# Patient Record
Sex: Female | Born: 1935 | Race: White | Hispanic: No | Smoking: Former smoker
Health system: Southern US, Community
[De-identification: ages and names within clinical notes are randomized; demographics above are authoritative.]

## PROBLEM LIST (undated history)

## (undated) DIAGNOSIS — S42309A Unspecified fracture of shaft of humerus, unspecified arm, initial encounter for closed fracture: Secondary | ICD-10-CM

## (undated) DIAGNOSIS — F0390 Unspecified dementia without behavioral disturbance: Secondary | ICD-10-CM

## (undated) DIAGNOSIS — I10 Essential (primary) hypertension: Secondary | ICD-10-CM

## (undated) DIAGNOSIS — I80209 Phlebitis and thrombophlebitis of unspecified deep vessels of unspecified lower extremity: Secondary | ICD-10-CM

## (undated) DIAGNOSIS — D696 Thrombocytopenia, unspecified: Secondary | ICD-10-CM

## (undated) DIAGNOSIS — M0520 Rheumatoid vasculitis with rheumatoid arthritis of unspecified site: Secondary | ICD-10-CM

## (undated) DIAGNOSIS — N390 Urinary tract infection, site not specified: Secondary | ICD-10-CM

## (undated) DIAGNOSIS — H3530 Unspecified macular degeneration: Secondary | ICD-10-CM

## (undated) DIAGNOSIS — D72829 Elevated white blood cell count, unspecified: Secondary | ICD-10-CM

## (undated) DIAGNOSIS — I809 Phlebitis and thrombophlebitis of unspecified site: Secondary | ICD-10-CM

## (undated) DIAGNOSIS — E079 Disorder of thyroid, unspecified: Secondary | ICD-10-CM

## (undated) DIAGNOSIS — Z7901 Long term (current) use of anticoagulants: Secondary | ICD-10-CM

## (undated) DIAGNOSIS — E039 Hypothyroidism, unspecified: Secondary | ICD-10-CM

## (undated) DIAGNOSIS — T148XXA Other injury of unspecified body region, initial encounter: Secondary | ICD-10-CM

## (undated) DIAGNOSIS — M1990 Unspecified osteoarthritis, unspecified site: Secondary | ICD-10-CM

## (undated) DIAGNOSIS — S32599A Other specified fracture of unspecified pubis, initial encounter for closed fracture: Secondary | ICD-10-CM

## (undated) DIAGNOSIS — S329XXA Fracture of unspecified parts of lumbosacral spine and pelvis, initial encounter for closed fracture: Secondary | ICD-10-CM

## (undated) HISTORY — DX: Hypothyroidism, unspecified: E03.9

## (undated) HISTORY — DX: Thrombocytopenia, unspecified: D69.6

## (undated) HISTORY — DX: Elevated white blood cell count, unspecified: D72.829

## (undated) HISTORY — DX: Urinary tract infection, site not specified: N39.0

## (undated) HISTORY — DX: Rheumatoid vasculitis with rheumatoid arthritis of unspecified site: M05.20

## (undated) HISTORY — DX: Other injury of unspecified body region, initial encounter: T14.8XXA

## (undated) HISTORY — PX: CHOLECYSTECTOMY: SHX55

## (undated) HISTORY — DX: Unspecified osteoarthritis, unspecified site: M19.90

## (undated) HISTORY — DX: Essential (primary) hypertension: I10

## (undated) HISTORY — PX: ABDOMINAL HYSTERECTOMY: SUR658

## (undated) HISTORY — PX: SINUS SURGERY WITH INSTATRAK: SHX5215

## (undated) HISTORY — DX: Disorder of thyroid, unspecified: E07.9

## (undated) HISTORY — DX: Phlebitis and thrombophlebitis of unspecified site: I80.9

## (undated) HISTORY — DX: Unspecified fracture of shaft of humerus, unspecified arm, initial encounter for closed fracture: S42.309A

## (undated) HISTORY — DX: Fracture of unspecified parts of lumbosacral spine and pelvis, initial encounter for closed fracture: S32.9XXA

## (undated) HISTORY — DX: Unspecified macular degeneration: H35.30

## (undated) HISTORY — DX: Phlebitis and thrombophlebitis of unspecified deep vessels of unspecified lower extremity: I80.209

## (undated) HISTORY — PX: OTHER SURGICAL HISTORY: SHX169

---

## 2000-03-10 ENCOUNTER — Emergency Department (HOSPITAL_COMMUNITY): Admission: EM | Admit: 2000-03-10 | Discharge: 2000-03-10 | Payer: Self-pay | Admitting: Emergency Medicine

## 2000-03-14 ENCOUNTER — Encounter: Payer: Self-pay | Admitting: Gastroenterology

## 2000-03-14 ENCOUNTER — Encounter: Admission: RE | Admit: 2000-03-14 | Discharge: 2000-03-14 | Payer: Self-pay | Admitting: Gastroenterology

## 2000-03-23 ENCOUNTER — Ambulatory Visit (HOSPITAL_COMMUNITY): Admission: RE | Admit: 2000-03-23 | Discharge: 2000-03-23 | Payer: Self-pay | Admitting: Gastroenterology

## 2002-03-28 ENCOUNTER — Ambulatory Visit (HOSPITAL_COMMUNITY): Admission: RE | Admit: 2002-03-28 | Discharge: 2002-03-28 | Payer: Self-pay | Admitting: Specialist

## 2002-12-10 ENCOUNTER — Encounter: Payer: Self-pay | Admitting: Cardiology

## 2002-12-10 ENCOUNTER — Encounter: Admission: RE | Admit: 2002-12-10 | Discharge: 2002-12-10 | Payer: Self-pay | Admitting: Cardiology

## 2003-07-08 ENCOUNTER — Ambulatory Visit (HOSPITAL_COMMUNITY): Admission: RE | Admit: 2003-07-08 | Discharge: 2003-07-08 | Payer: Self-pay | Admitting: Specialist

## 2003-10-13 ENCOUNTER — Encounter: Admission: RE | Admit: 2003-10-13 | Discharge: 2003-10-13 | Payer: Self-pay | Admitting: Orthopedic Surgery

## 2003-12-17 ENCOUNTER — Encounter: Admission: RE | Admit: 2003-12-17 | Discharge: 2003-12-17 | Payer: Self-pay | Admitting: Cardiology

## 2005-01-12 ENCOUNTER — Encounter: Admission: RE | Admit: 2005-01-12 | Discharge: 2005-01-12 | Payer: Self-pay | Admitting: Cardiology

## 2005-10-08 ENCOUNTER — Encounter (HOSPITAL_COMMUNITY): Admission: RE | Admit: 2005-10-08 | Discharge: 2006-01-06 | Payer: Self-pay | Admitting: Otolaryngology

## 2006-01-14 ENCOUNTER — Encounter: Admission: RE | Admit: 2006-01-14 | Discharge: 2006-01-14 | Payer: Self-pay | Admitting: Cardiology

## 2006-01-22 ENCOUNTER — Inpatient Hospital Stay (HOSPITAL_COMMUNITY): Admission: EM | Admit: 2006-01-22 | Discharge: 2006-02-01 | Payer: Self-pay | Admitting: Emergency Medicine

## 2007-02-01 ENCOUNTER — Encounter: Admission: RE | Admit: 2007-02-01 | Discharge: 2007-02-01 | Payer: Self-pay | Admitting: Cardiology

## 2009-08-26 ENCOUNTER — Encounter: Admission: RE | Admit: 2009-08-26 | Discharge: 2009-08-26 | Payer: Self-pay | Admitting: Cardiology

## 2010-05-20 ENCOUNTER — Ambulatory Visit: Payer: Self-pay | Admitting: Cardiology

## 2010-06-06 ENCOUNTER — Emergency Department (HOSPITAL_COMMUNITY): Admission: EM | Admit: 2010-06-06 | Discharge: 2010-06-06 | Payer: Self-pay | Admitting: Emergency Medicine

## 2010-06-07 ENCOUNTER — Inpatient Hospital Stay (HOSPITAL_COMMUNITY): Admission: EM | Admit: 2010-06-07 | Discharge: 2010-06-11 | Payer: Self-pay | Admitting: Emergency Medicine

## 2010-08-11 ENCOUNTER — Ambulatory Visit: Payer: Self-pay | Admitting: Cardiology

## 2010-10-05 ENCOUNTER — Ambulatory Visit: Payer: Self-pay | Admitting: Cardiology

## 2010-10-19 ENCOUNTER — Encounter: Payer: Self-pay | Admitting: Cardiology

## 2010-11-02 ENCOUNTER — Encounter (INDEPENDENT_AMBULATORY_CARE_PROVIDER_SITE_OTHER): Payer: Self-pay

## 2010-11-02 DIAGNOSIS — Z7901 Long term (current) use of anticoagulants: Secondary | ICD-10-CM

## 2010-11-02 DIAGNOSIS — I749 Embolism and thrombosis of unspecified artery: Secondary | ICD-10-CM

## 2010-11-18 ENCOUNTER — Encounter (INDEPENDENT_AMBULATORY_CARE_PROVIDER_SITE_OTHER): Payer: BC Managed Care – PPO

## 2010-11-18 DIAGNOSIS — Z7901 Long term (current) use of anticoagulants: Secondary | ICD-10-CM

## 2010-11-18 DIAGNOSIS — E039 Hypothyroidism, unspecified: Secondary | ICD-10-CM

## 2010-11-18 DIAGNOSIS — I119 Hypertensive heart disease without heart failure: Secondary | ICD-10-CM

## 2010-12-02 ENCOUNTER — Other Ambulatory Visit (INDEPENDENT_AMBULATORY_CARE_PROVIDER_SITE_OTHER): Payer: BC Managed Care – PPO

## 2010-12-02 DIAGNOSIS — Z7901 Long term (current) use of anticoagulants: Secondary | ICD-10-CM

## 2010-12-02 DIAGNOSIS — I749 Embolism and thrombosis of unspecified artery: Secondary | ICD-10-CM

## 2010-12-10 LAB — PROTIME-INR
INR: 2.45 — ABNORMAL HIGH (ref 0.00–1.49)
INR: 2.67 — ABNORMAL HIGH (ref 0.00–1.49)
INR: 2.7 — ABNORMAL HIGH (ref 0.00–1.49)
Prothrombin Time: 26.7 seconds — ABNORMAL HIGH (ref 11.6–15.2)
Prothrombin Time: 28 seconds — ABNORMAL HIGH (ref 11.6–15.2)

## 2010-12-10 LAB — CBC
HCT: 33.5 % — ABNORMAL LOW (ref 36.0–46.0)
HCT: 35.1 % — ABNORMAL LOW (ref 36.0–46.0)
HCT: 38.5 % (ref 36.0–46.0)
HCT: 40.5 % (ref 36.0–46.0)
Hemoglobin: 11.4 g/dL — ABNORMAL LOW (ref 12.0–15.0)
Hemoglobin: 12 g/dL (ref 12.0–15.0)
Hemoglobin: 13.6 g/dL (ref 12.0–15.0)
MCH: 34 pg (ref 26.0–34.0)
MCH: 34.1 pg — ABNORMAL HIGH (ref 26.0–34.0)
MCH: 34.3 pg — ABNORMAL HIGH (ref 26.0–34.0)
MCH: 34.3 pg — ABNORMAL HIGH (ref 26.0–34.0)
MCHC: 34 g/dL (ref 30.0–36.0)
MCHC: 33.6 g/dL (ref 30.0–36.0)
MCHC: 34.2 g/dL (ref 30.0–36.0)
MCHC: 34.2 g/dL (ref 30.0–36.0)
MCHC: 34.3 g/dL (ref 30.0–36.0)
MCV: 100 fL (ref 78.0–100.0)
MCV: 101.8 fL — ABNORMAL HIGH (ref 78.0–100.0)
MCV: 102 fL — ABNORMAL HIGH (ref 78.0–100.0)
MCV: 99.7 fL (ref 78.0–100.0)
Platelets: 214 10*3/uL (ref 150–400)
Platelets: 144 10*3/uL — ABNORMAL LOW (ref 150–400)
RBC: 3.97 MIL/uL (ref 3.87–5.11)
RBC: 4.33 MIL/uL (ref 3.87–5.11)
RDW: 12.4 % (ref 11.5–15.5)
RDW: 12.6 % (ref 11.5–15.5)
RDW: 12.8 % (ref 11.5–15.5)
RDW: 12.9 % (ref 11.5–15.5)
RDW: 13 % (ref 11.5–15.5)
WBC: 13.2 K/uL — ABNORMAL HIGH (ref 4.0–10.5)

## 2010-12-10 LAB — URINALYSIS, ROUTINE W REFLEX MICROSCOPIC
Bilirubin Urine: NEGATIVE
Bilirubin Urine: NEGATIVE
Glucose, UA: NEGATIVE mg/dL
Hgb urine dipstick: NEGATIVE
Ketones, ur: NEGATIVE mg/dL
Ketones, ur: NEGATIVE mg/dL
Nitrite: NEGATIVE
Nitrite: NEGATIVE
Protein, ur: NEGATIVE mg/dL
Protein, ur: NEGATIVE mg/dL
Specific Gravity, Urine: 1.024 (ref 1.005–1.030)
Urobilinogen, UA: 0.2 mg/dL (ref 0.0–1.0)
Urobilinogen, UA: 2 mg/dL — ABNORMAL HIGH (ref 0.0–1.0)
pH: 5.5 (ref 5.0–8.0)

## 2010-12-10 LAB — DIFFERENTIAL
Basophils Absolute: 0 10*3/uL (ref 0.0–0.1)
Basophils Absolute: 0 K/uL (ref 0.0–0.1)
Basophils Relative: 0 % (ref 0–1)
Eosinophils Absolute: 0 10*3/uL (ref 0.0–0.7)
Eosinophils Relative: 0 % (ref 0–5)
Lymphocytes Relative: 12 % (ref 12–46)
Lymphocytes Relative: 22 % (ref 12–46)
Lymphs Abs: 1.6 K/uL (ref 0.7–4.0)
Monocytes Absolute: 1 10*3/uL (ref 0.1–1.0)
Monocytes Absolute: 1.5 K/uL — ABNORMAL HIGH (ref 0.1–1.0)
Monocytes Relative: 11 % (ref 3–12)
Neutro Abs: 10 K/uL — ABNORMAL HIGH (ref 1.7–7.7)
Neutrophils Relative %: 69 % (ref 43–77)
Neutrophils Relative %: 76 % (ref 43–77)

## 2010-12-10 LAB — URINE CULTURE
Colony Count: >=100000
Colony Count: NO GROWTH
Culture  Setup Time: 201109111811
Culture  Setup Time: 201109150018
Culture: NO GROWTH
Special Requests: NEGATIVE

## 2010-12-10 LAB — TSH: TSH: 1.357 u[IU]/mL (ref 0.350–4.500)

## 2010-12-10 LAB — COMPREHENSIVE METABOLIC PANEL
ALT: 46 U/L — ABNORMAL HIGH (ref 0–35)
AST: 139 U/L — ABNORMAL HIGH (ref 0–37)
Alkaline Phosphatase: 58 U/L (ref 39–117)
BUN: 19 mg/dL (ref 6–23)
Calcium: 9.4 mg/dL (ref 8.4–10.5)
GFR calc Af Amer: >60 mL/min (ref >60)
Total Protein: 6.1 g/dL (ref 6.0–8.3)

## 2010-12-10 LAB — BASIC METABOLIC PANEL
BUN: 13 mg/dL (ref 6–23)
BUN: 19 mg/dL (ref 6–23)
CO2: 30 mEq/L (ref 19–32)
CO2: 30 mEq/L (ref 19–32)
Calcium: 9.3 mg/dL (ref 8.4–10.5)
Calcium: 9.5 mg/dL (ref 8.4–10.5)
Creatinine, Ser: 0.96 mg/dL (ref 0.4–1.2)
GFR calc non Af Amer: >60 mL/min (ref >60)
Glucose, Bld: 116 mg/dL — ABNORMAL HIGH (ref 70–99)
Glucose, Bld: 146 mg/dL — ABNORMAL HIGH (ref 70–99)
Potassium: 3.6 mEq/L (ref 3.5–5.1)

## 2010-12-10 LAB — BASIC METABOLIC PANEL WITH GFR
Chloride: 98 meq/L (ref 96–112)
GFR calc Af Amer: >60 mL/min (ref >60)
GFR calc non Af Amer: 56 mL/min — ABNORMAL LOW (ref >60)
Potassium: 3.8 meq/L (ref 3.5–5.1)
Sodium: 135 meq/L (ref 135–145)

## 2010-12-10 LAB — POCT I-STAT, CHEM 8
Calcium, Ion: 1.19 mmol/L (ref 1.12–1.32)
Chloride: 105 mEq/L (ref 96–112)
Creatinine, Ser: 0.9 mg/dL (ref 0.4–1.2)
HCT: 46 % (ref 36.0–46.0)
Potassium: 3.5 mEq/L (ref 3.5–5.1)
Sodium: 140 mEq/L (ref 135–145)

## 2011-01-05 ENCOUNTER — Encounter: Payer: Self-pay | Admitting: *Deleted

## 2011-01-05 ENCOUNTER — Ambulatory Visit (INDEPENDENT_AMBULATORY_CARE_PROVIDER_SITE_OTHER): Payer: BC Managed Care – PPO | Admitting: Cardiology

## 2011-01-05 ENCOUNTER — Ambulatory Visit (INDEPENDENT_AMBULATORY_CARE_PROVIDER_SITE_OTHER): Payer: Medicare Other | Admitting: Cardiology

## 2011-01-05 ENCOUNTER — Encounter: Payer: Self-pay | Admitting: Cardiology

## 2011-01-05 DIAGNOSIS — I82409 Acute embolism and thrombosis of unspecified deep veins of unspecified lower extremity: Secondary | ICD-10-CM | POA: Insufficient documentation

## 2011-01-05 DIAGNOSIS — H3530 Unspecified macular degeneration: Secondary | ICD-10-CM

## 2011-01-05 DIAGNOSIS — M1990 Unspecified osteoarthritis, unspecified site: Secondary | ICD-10-CM

## 2011-01-05 DIAGNOSIS — I119 Hypertensive heart disease without heart failure: Secondary | ICD-10-CM | POA: Insufficient documentation

## 2011-01-05 DIAGNOSIS — M810 Age-related osteoporosis without current pathological fracture: Secondary | ICD-10-CM | POA: Insufficient documentation

## 2011-01-05 DIAGNOSIS — E039 Hypothyroidism, unspecified: Secondary | ICD-10-CM | POA: Insufficient documentation

## 2011-01-05 LAB — POCT INR: INR: 3.2

## 2011-01-05 NOTE — Assessment & Plan Note (Signed)
The patient has a past history of recurrent deep vein thrombosis.  She has been on long-term Coumadin.  She's not having any symptoms of acute thrombophlebitis.  She's having no symptoms of pulmonary embolism.  Her prothrombin times have generally been within range.  Today's result is pending.

## 2011-01-05 NOTE — Assessment & Plan Note (Signed)
Clinically the patient is euthyroid.  We last checked her thyroid function in February 2012 at which time her TSH was still slightly high and we increased her Synthroid.  She's not having any symptoms of hypothyroidism.

## 2011-01-05 NOTE — Assessment & Plan Note (Signed)
The patient has severe macular degeneration and is functionally blind.  She can no longer drive or read.

## 2011-01-05 NOTE — Progress Notes (Signed)
HPI: This pleasant 77 year old woman is seen back for a scheduled followup office visit.  She has a past history of deep vein thrombosis.  She is on long-term Coumadin.  She took a bad fall in September 2011 and suffered a fractured pelvis.  She had to be in a nursing home for 2 months but is now back home and walks with a walker her nephew lives in a house with her and helps her.  The patient herself is blind.  Patient has a history of essential hypertension and a history of hypothyroidism.  Current Outpatient Prescriptions  Medication Sig Dispense Refill  . acetaminophen (TYLENOL) 325 MG tablet Take 650 mg by mouth every 6 (six) hours as needed.        . bisoprolol-hydrochlorothiazide (ZIAC) 5-6.25 MG per tablet Take 1 tablet by mouth daily.        . diazepam (VALIUM) 5 MG tablet Take 5 mg by mouth every 6 (six) hours as needed.        . diltiazem (CARDIZEM CD) 240 MG 24 hr capsule Take 240 mg by mouth daily.        . hydrochlorothiazide 25 MG tablet Take 25 mg by mouth daily.        Marland Kitchen levothyroxine (SYNTHROID, LEVOTHROID) 50 MCG tablet Take 50 mcg by mouth daily. Take 3 tabs Mon and Wed. ;   1 tablet 4 all others      . predniSONE (DELTASONE) 5 MG tablet Take 5 mg by mouth daily.        Marland Kitchen warfarin (COUMADIN) 5 MG tablet Take 5 mg by mouth daily.          Allergies  Allergen Reactions  . Keflex   . Penicillins   . Sinutab (Sine-Off)   . Skelaxin   . Tetanus Toxoids   . Zestril (Lisinopril)     Patient Active Problem List  Diagnoses  . Hypothyroidism  . Osteoporosis  . Macular degeneration  . Deep vein thrombosis (DVT)  . Benign hypertensive heart disease without heart failure  . Osteoarthritis    History  Smoking status  . Never Smoker   Smokeless tobacco  . Not on file    History  Alcohol Use No    Family History  Problem Relation Age of Onset  . Abnormal EKG Neg Hx   . Abnormal newborn screen Neg Hx   . Achalasia Neg Hx   . Achondroplasia Neg Hx   . Acne Neg Hx    . Acromegaly Neg Hx   . Actinic keratosis Neg Hx   . Acute lymphoblastic leukemia Neg Hx   . Addison's disease Neg Hx   . Adrenal disorder Neg Hx   . Albinism Neg Hx   . Alcohol abuse Neg Hx   . Alkaptonuria Neg Hx   . Allergic rhinitis Neg Hx   . Allergies Neg Hx   . Allergy (severe) Neg Hx   . Alopecia Neg Hx   . Alpha-1 antitrypsin deficiency Neg Hx   . Alport syndrome Neg Hx   . ALS Neg Hx   . Alzheimer's disease Neg Hx   . Ambiguous genitalia Neg Hx   . Amblyopia Neg Hx   . Amenorrhea Neg Hx   . Anal fissures Neg Hx   . Anemia Neg Hx   . Anesthesia problems Neg Hx   . Aneurysm Neg Hx   . Angelman syndrome Neg Hx   . Angina Neg Hx   . Angioedema Neg Hx   .  Ankylosing spondylitis Neg Hx   . Anorectal malformation Neg Hx   . Anorexia nervosa Neg Hx   . Anti-cardiolipin syndrome Neg Hx   . Antithrombin III deficiency Neg Hx   . Anuerysm Neg Hx   . Anxiety disorder Neg Hx   . Aortic aneurysm Neg Hx   . Aortic dissection Neg Hx   . Aortic stenosis Neg Hx   . Aphthous stomatitis Neg Hx   . Aplastic anemia Neg Hx   . Appendicitis Neg Hx   . Apraxia Neg Hx   . Arnold-Chiari malformation Neg Hx   . Arrhythmia Neg Hx   . Arthritis Neg Hx   . Asperger's syndrome Neg Hx   . Asthma Neg Hx   . Ataxia Neg Hx   . Ataxia telangiectasia Neg Hx   . Atopy Neg Hx   . Atrial fibrillation Neg Hx   . Atrophic kidney Neg Hx   . Auditory processing disorder Neg Hx   . Autism Neg Hx   . Autism spectrum disorder Neg Hx   . Autoimmune disease Neg Hx   . AVM Neg Hx   . Baker's cyst Neg Hx   . Barrett's esophagus Neg Hx   . Bartter's syndrome Neg Hx   . Basal cell carcinoma Neg Hx   . Behavior problems Neg Hx   . Bell's palsy Neg Hx   . Benign prostatic hyperplasia Neg Hx   . Bipolar disorder Neg Hx   . Birth defects Neg Hx   . Birth marks Neg Hx   . Blindness Neg Hx   . Bone cancer Neg Hx   . BOR syndrome Neg Hx   . Bow legs Neg Hx   . Bradycardia Neg Hx   . Brain  cancer Neg Hx   . BRCA 1/2 Neg Hx   . Breast cancer Neg Hx   . Broken bones Neg Hx   . Bronchiolitis Neg Hx   . Bronchopulmonary dysplasia Neg Hx   . Bruton's disease Neg Hx   . Bulemia Neg Hx   . Bullous pemphigoid Neg Hx   . Bunion Neg Hx   . Bursitis Neg Hx   . Caf-au-lait spots Neg Hx   . Calcium disorder Neg Hx   . Canavan disease Neg Hx   . Cancer Neg Hx   . Cardiomyopathy Neg Hx   . Carpal tunnel syndrome Neg Hx   . Cataracts Neg Hx   . Celiac disease Neg Hx   . Cerebral aneurysm Neg Hx   . Cerebral palsy Neg Hx   . Cervical cancer Neg Hx   . Cervical polyp Neg Hx   . Cervicitis Neg Hx   . Chalasia Neg Hx   . Chalazion Neg Hx   . Charcot-Marie-Tooth disease Neg Hx   . Chediak-Higashi syndrome Neg Hx   . Chiari malformation Neg Hx   . Childhood respiratory disease Neg Hx   . Choanal atresia Neg Hx   . Cholecystitis Neg Hx   . Cholelithiasis Neg Hx   . Cholesteatoma Neg Hx   . Chondromalacia Neg Hx   . Chorea Neg Hx   . Chromosomal disorder Neg Hx   . Chronic bronchitis Neg Hx   . Chronic fatigue Neg Hx   . Chronic granulomatous disease Neg Hx   . Chronic infections Neg Hx   . Cirrhosis Neg Hx   . Cleft lip Neg Hx   . Cleft palate Neg Hx   . Clotting disorder Neg  Hx   . Club foot Neg Hx   . Coarctation of the aorta Neg Hx   . Colitis Neg Hx   . Collagen disease Neg Hx   . Colon cancer Neg Hx   . Colon polyps Neg Hx   . Colonic polyp Neg Hx   . Color blindness Neg Hx   . Conduct disorder Neg Hx   . Conductive hearing loss Neg Hx   . Congenital adrenal hyperplasia Neg Hx   . Congenital heart disease Neg Hx   . Conjunctivitis Neg Hx   . Consanguinity Neg Hx   . Constipation Neg Hx   . COPD Neg Hx   . Corneal abrasion Neg Hx   . Corneal ulcer Neg Hx   . Coronary aneurysm Neg Hx   . Coronary artery disease Neg Hx   . Cowden syndrome Neg Hx   . Craniosynostosis Neg Hx   . Cri-du-chat syndrome Neg Hx   . Crohn's disease Neg Hx   . Cushing syndrome  Neg Hx   . Cystic fibrosis Neg Hx   . Cystic kidney disease Neg Hx   . Cystinosis Neg Hx   . Decreased libido Neg Hx   . Deep vein thrombosis Neg Hx   . Delayed menopause Neg Hx   . Delayed puberty Neg Hx   . Dementia Neg Hx   . Dental caries Neg Hx   . Depression Neg Hx   . Dermatomyositis Neg Hx   . DES usage Neg Hx   . Developmental delay Neg Hx   . Diabetes Neg Hx   . Diabetes insipidus Neg Hx   . Diabetes type I Neg Hx   . Diabetes type II Neg Hx   . Diabetic kidney disease Neg Hx   . DiGeorge syndrome Neg Hx   . Dilated cardiomyopathy Neg Hx   . Dislocations Neg Hx   . Diverticulitis Neg Hx   . Diverticulosis Neg Hx   . Down syndrome Neg Hx   . Drug abuse Neg Hx   . Dupuytren's contracture Neg Hx   . Dwarfism Neg Hx   . Dysfunctional uterine bleeding Neg Hx   . Dysmenorrhea Neg Hx   . Dyspareunia Neg Hx   . Dysphagia Neg Hx   . Dysrhythmia Neg Hx   . Dystonia Neg Hx   . Early death Neg Hx   . Early menopause Neg Hx   . Early puberty Neg Hx   . Eating disorder Neg Hx   . Eclampsia Neg Hx   . Ectodermal dysplasia Neg Hx   . Eczema Neg Hx   . Edema Neg Hx   . Edward's syndrome Neg Hx   . Ehlers-Danlos syndrome Neg Hx   . Emotional abuse Neg Hx   . Emphysema Neg Hx   . Encopresis Neg Hx   . Endocrine tumor Neg Hx   . Endocrinopathy Neg Hx   . Endometrial cancer Neg Hx   . Endometriosis Neg Hx   . Enuresis Neg Hx   . Eosinophilic granuloma Neg Hx   . Epididymitis Neg Hx   . Erectile dysfunction Neg Hx   . Erythema nodosum Neg Hx   . Esophageal cancer Neg Hx   . Esophageal varices Neg Hx   . Esophagitis Neg Hx   . Exostosis Neg Hx   . Fabry's disease Neg Hx   . Factor IX deficiency Neg Hx   . Factor V Leiden deficiency Neg Hx   . Factor VIII  deficiency Neg Hx   . Failure to thrive Neg Hx   . Fainting Neg Hx   . Familial dysautonomia Neg Hx   . Familial nephritis Neg Hx   . Familial polyposis Neg Hx   . Fanconi anemia Neg Hx   . Febrile seizures  Neg Hx   . Fibrocystic breast disease Neg Hx   . Fibroids Neg Hx   . Fibromyalgia Neg Hx   . Food intolerance Neg Hx   . Fragile X syndrome Neg Hx   . Friedreich's ataxia Neg Hx   . Frontotemporal dementia Neg Hx   . Fuch's dystrophy Neg Hx   . Gait disorder Neg Hx   . Galactosemia Neg Hx   . Gallbladder disease Neg Hx   . Gaucher's disease Neg Hx   . Genodermatoses Neg Hx   . GER disease Neg Hx   . Gestational diabetes Neg Hx   . GI problems Neg Hx   . Glaucoma Neg Hx   . Glomerulonephritis Neg Hx   . Glucose-6-phos deficiency Neg Hx   . Glycogen storage disease Neg Hx   . Goiter Neg Hx   . Gonadal disorder Neg Hx   . Gout Neg Hx   . Graves' disease Neg Hx   . Growth hormone deficiency Neg Hx   . GU problems Neg Hx   . Hammer toes Neg Hx   . Hartnup's disease Neg Hx   . Hashimoto's thyroiditis Neg Hx   . Healthy Neg Hx   . Hearing loss Neg Hx   . Heart attack Neg Hx   . Heart block Neg Hx   . Heart defect Neg Hx   . Heart disease Neg Hx   . Heart failure Neg Hx   . Heart murmur Neg Hx   . Hemangiomas Neg Hx   . Hematuria Neg Hx   . Hemochromatosis Neg Hx   . Hemolytic uremic syndrome Neg Hx   . Hemophilia Neg Hx   . Henoch-Schonlein purpura Neg Hx   . Hepatitis Neg Hx   . Hepatomegaly Neg Hx   . Hereditary spherocytosis Neg Hx   . Hernia Neg Hx   . Hiatal hernia Neg Hx   . High arches Neg Hx   . Hip dysplasia Neg Hx   . Hip fracture Neg Hx   . Hirschsprung's disease Neg Hx   . Hirsutism Neg Hx   . Histiocytosis X Neg Hx   . HIV Neg Hx   . HLA-B27 positive Neg Hx   . Hodgkin's lymphoma Neg Hx   . Homocystinuria Neg Hx   . Hunter's disease Neg Hx   . Huntington's disease Neg Hx   . Hydrocele Neg Hx   . Hydrocephalus Neg Hx   . Hygroma Neg Hx   . Hypercalcemia Neg Hx   . Hypereosinophilic syndrome Neg Hx   . Hyperinsulinemia Neg Hx   . Hyperkalemia Neg Hx   . Hyperlipidemia Neg Hx   . Hypermobility Neg Hx   . Hypernasality Neg Hx   . Hyperopia Neg  Hx   . Hyperparathyroidism Neg Hx   . Hypersomnolence Neg Hx   . Hypertension Neg Hx   . Hyperthyroidism Neg Hx   . Hypertrophic cardiomyopathy Neg Hx   . Hypokalemia Neg Hx   . Hypoparathyroidism Neg Hx   . Hypoplastic kidney Neg Hx   . Hypotension Neg Hx   . Hypothyroidism Neg Hx   . Idiopathic pulmonary fibrosis Neg Hx   .  Idiopathic torsion dystonia Neg Hx   . IgA nephropathy Neg Hx   . Immunodeficiency Neg Hx   . Imperforate anus Neg Hx   . Impotence Neg Hx   . Impulse control disorder Neg Hx   . Incompetent cervix Neg Hx   . Infertility Neg Hx   . Inflammatory bowel disease Neg Hx   . Inguinal hernia Neg Hx   . Insomnia Neg Hx   . Insulin resistance Neg Hx   . Interstitial cystitis Neg Hx   . Intestinal malrotation Neg Hx   . Intestinal polyp Neg Hx   . Intracerebral hemorrhage Neg Hx   . Iron deficiency Neg Hx   . Irregular heart beat Neg Hx   . Irritable bowel syndrome Neg Hx   . Jaundice Neg Hx   . Job's syndrome Neg Hx   . Joint hypermobility Neg Hx   . Juvenile idiopathic arthritis Neg Hx   . Kartagener's syndrome Neg Hx   . Kawasaki disease Neg Hx   . Keloids Neg Hx   . Kennedy's disease Neg Hx   . Keratoconus Neg Hx   . Kidney cancer Neg Hx   . Kidney disease Neg Hx   . Kidney failure Neg Hx   . Kidney nephrosis Neg Hx   . Klinefelter's syndrome Neg Hx   . Krabbe disease Neg Hx   . Kyphosis Neg Hx   . Labyrinthitis Neg Hx   . Lactose intolerance Neg Hx   . Language disorder Neg Hx   . Lead poisoning Neg Hx   . Learning disabilities Neg Hx   . Legg-Calve-Perthes disease Neg Hx   . Lesch-Nyhan syndrome Neg Hx   . Leukemia Neg Hx   . Lichen planus Neg Hx   . Li-Fraumeni syndrome Neg Hx   . Liver cancer Neg Hx   . Liver disease Neg Hx   . Long QT syndrome Neg Hx   . Lumbar disc disease Neg Hx   . Lung cancer Neg Hx   . Lung disease Neg Hx   . Lupus Neg Hx   . Lymphoma Neg Hx   . Macrocephaly Neg Hx   . Macrosomia Neg Hx   . Macular  degeneration Neg Hx   . Malignant hypertension Neg Hx   . Malignant hyperthermia Neg Hx   . Maple syrup urine disease Neg Hx   . Marfan syndrome Neg Hx   . Mastocytosis Neg Hx   . Melanoma Neg Hx   . Memory loss Neg Hx   . Meniere's disease Neg Hx   . Menstrual problems Neg Hx   . Mental illness Neg Hx   . Mental retardation Neg Hx   . Metabolic syndrome Neg Hx   . Microcephaly Neg Hx   . Migraines Neg Hx   . Milk intolerance Neg Hx   . Miscarriages / Stillbirths Neg Hx   . Mitochondrial disorder Neg Hx   . Mitral valve prolapse Neg Hx   . Motor neuron disease Neg Hx   . Movement disorder Neg Hx   . Moyamoya disease Neg Hx   . Multiple births Neg Hx   . Multiple endocrine neoplasia Neg Hx   . Multiple fractures Neg Hx   . Multiple myeloma Neg Hx   . Multiple sclerosis Neg Hx   . Muscle cancer Neg Hx   . Muscular dystrophy Neg Hx   . Myasthenia gravis Neg Hx   . Myelodysplastic syndrome Neg Hx   . Myocarditis  Neg Hx   . Myoclonus Neg Hx   . Myopathy Neg Hx   . Nail disease Neg Hx   . Narcolepsy Neg Hx   . Nephritis Neg Hx   . Nephrolithiasis Neg Hx   . Nephrotic syndrome Neg Hx   . Neural tube defect Neg Hx   . Neurodegenerative disease Neg Hx   . Neurofibromatosis Neg Hx   . Neuromuscular disorder Neg Hx   . Neuropathy Neg Hx   . Neutropenia Neg Hx   . Nevi Neg Hx   . Niemann-Pick disease Neg Hx   . Night blindness Neg Hx   . Nocturnal enuresis Neg Hx   . Nystagmus Neg Hx   . Obesity Neg Hx   . OCD Neg Hx   . ODD Neg Hx   . Orchitis Neg Hx   . Osler-Weber-Rendu syndrome Neg Hx   . Osteoarthritis Neg Hx   . Osteochondroma Neg Hx   . Osteogenesis imperfecta Neg Hx   . Osteopenia Neg Hx   . Osteoporosis Neg Hx   . Osteosarcoma Neg Hx   . Osteosclerosis Neg Hx   . Other Neg Hx   . Otitis media Neg Hx   . Ovarian cancer Neg Hx   . Ovarian cysts Neg Hx   . Oxalosis Neg Hx   . Paget's disease of bone Neg Hx   . Pancreatic cancer Neg Hx   . Pancreatitis  Neg Hx   . Panhypopituitarism Neg Hx   . Panic disorder Neg Hx   . Paranoid behavior Neg Hx   . Parasomnia Neg Hx   . Parkinsonism Neg Hx   . Patau's syndrome Neg Hx   . Pathological gambling Neg Hx   . PDD Neg Hx   . Pectus carinatum Neg Hx   . Pectus excavatum Neg Hx   . Pelvic inflammatory disease Neg Hx   . Pemphigus vulgaris Neg Hx   . Penile cancer Neg Hx   . Periodic limb movement Neg Hx   . Peripheral vascular disease Neg Hx   . Pernicious anemia Neg Hx   . Personality disorder Neg Hx   . Pes cavus Neg Hx   . Physical abuse Neg Hx   . Otilio Jefferson sequence Neg Hx   . PKU Neg Hx   . Plantar fasciitis Neg Hx   . Pleurisy Neg Hx   . Pneumonia Neg Hx   . Polychondritis Neg Hx   . Polycystic kidney disease Neg Hx   . Polycystic ovary syndrome Neg Hx   . Polycythemia Neg Hx   . Polymyalgia rheumatica Neg Hx   . Polymyositis Neg Hx   . Pompe disease Neg Hx   . Positive PPD/TB Exposure Neg Hx   . Posterior urethral valves Neg Hx   . Post-traumatic stress disorder Neg Hx   . Prader-Willi syndrome Neg Hx   . Premature birth Neg Hx   . Premature ovarian failure Neg Hx   . Preterm labor Neg Hx   . Prolactinoma Neg Hx   . Prostate cancer Neg Hx   . Prostatitis Neg Hx   . Protein C deficiency Neg Hx   . Protein S deficiency Neg Hx   . Proteinuria Neg Hx   . Prune belly syndrome Neg Hx   . Pruritis Neg Hx   . Pseudochol deficiency Neg Hx   . Pseudotumor cerebri Neg Hx   . Psoriasis Neg Hx   . Psychosis Neg Hx   . Ptosis  Neg Hx   . Pulmonary embolism Neg Hx   . Pulmonary fibrosis Neg Hx   . Pyelonephritis Neg Hx   . Pyloric stenosis Neg Hx   . Pyruvate dehydrogenase deficiency Neg Hx   . Rashes / Skin problems Neg Hx   . Raynaud syndrome Neg Hx   . Rectal cancer Neg Hx   . Recurrent abdominal pain Neg Hx   . Reiter's syndrome Neg Hx   . Renal tubular acidosis Neg Hx   . Restless legs syndrome Neg Hx   . Retinal degeneration Neg Hx   . Retinal detachment Neg Hx    . Retinitis pigmentosa Neg Hx   . Retinoblastoma Neg Hx   . Retinopathy of prematurity Neg Hx   . Reye's syndrome Neg Hx   . Rheum arthritis Neg Hx   . Rheumatic fever Neg Hx   . Rheumatologic disease Neg Hx   . Rickets Neg Hx   . Rosacea Neg Hx   . Sacroiliitis Neg Hx   . Sarcoidosis Neg Hx   . Schizophrenia Neg Hx   . Scleritis Neg Hx   . Scleroderma Neg Hx   . Scoliosis Neg Hx   . Seasonal affective disorder Neg Hx   . Seizures Neg Hx   . Selective mutism Neg Hx   . Sensorineural hearing loss Neg Hx   . Severe combined immunodeficiency Neg Hx   . Severe sprains Neg Hx   . Sexual abuse Neg Hx   . Short stature Neg Hx   . Sick sinus syndrome Neg Hx   . Sickle cell anemia Neg Hx   . Sickle cell trait Neg Hx   . SIDS Neg Hx   . Single kidney Neg Hx   . Sinusitis Neg Hx   . Sjogren's syndrome Neg Hx   . Skeletal dysplasia Neg Hx   . Skin cancer Neg Hx   . Skin telangiectasia Neg Hx   . Sleep apnea Neg Hx   . Sleep disorder Neg Hx   . Sleep walking Neg Hx   . Snoring Neg Hx   . Social phobia Neg Hx   . Speech disorder Neg Hx   . Spina bifida Neg Hx   . Spinal muscular atrophy Neg Hx   . Splenomegaly Neg Hx   . Spondyloarthropathy Neg Hx   . Spondylolisthesis Neg Hx   . Spondylolysis Neg Hx   . Squamous cell carcinoma Neg Hx   . Stevens-Johnson syndrome Neg Hx   . Stickler syndrome Neg Hx   . Stomach cancer Neg Hx   . Strabismus Neg Hx   . Stroke Neg Hx   . Stuttering Neg Hx   . Subarachnoid hemorrhage Neg Hx   . Sudden death Neg Hx   . Suicidality Neg Hx   . Supraventricular tachycardia Neg Hx   . Swallowing difficulties Neg Hx   . Tall stature Neg Hx   . Tay-Sachs disease Neg Hx   . Testicular cancer Neg Hx   . Thalassemia Neg Hx   . Throat cancer Neg Hx   . Thrombocytopenia Neg Hx   . Thrombophilia Neg Hx   . Thrombophlebitis Neg Hx   . Thrombosis Neg Hx   . Thymic aplasia Neg Hx   . Thyroid cancer Neg Hx   . Thyroid disease Neg Hx   . Thyroid  nodules Neg Hx   . Tics Neg Hx   . Tongue cancer Neg Hx   . Torticollis Neg Hx   .  Tourette syndrome Neg Hx   . Tracheal cancer Neg Hx   . Tracheoesophageal fistual Neg Hx   . Tracheomalacia Neg Hx   . Transient ischemic attack Neg Hx   . Treacher Collins syndrome Neg Hx   . Tremor Neg Hx   . Tuberculosis Neg Hx   . Tuberous sclerosis Neg Hx   . Turner syndrome Neg Hx   . Ulcerative colitis Neg Hx   . Ulcers Neg Hx   . Undescended testes Neg Hx   . Unexplained death Neg Hx   . Urinary tract infection Neg Hx   . Urolithiasis Neg Hx   . Urticaria Neg Hx   . Uterine cancer Neg Hx   . Uveitis Neg Hx   . Vaginal cancer Neg Hx   . Valvular heart disease Neg Hx   . Vasculitis Neg Hx   . Velocardiofacial syndrome Neg Hx   . Venous thrombosis Neg Hx   . Verruca vulgaris  Neg Hx   . Vesicoureteral reflux Neg Hx   . Vision loss Neg Hx   . Vitamin D deficiency Neg Hx   . Vitiligo Neg Hx   . Voice disorder Neg Hx   . Von Gierke's disease Neg Hx   . Von Hippel-Lindau syndrome Neg Hx   . Von Willebrand disease Neg Hx   . Werdnig Hoffman Disease Neg Hx   . Williams syndrome Neg Hx   . Wilm's tumor Neg Hx   . Wilson's disease Neg Hx   . Wiskott-Aldrich syndrome Neg Hx   . Evelene Croon Parkinson White syndrome Neg Hx   . Xeroderma pigmentosa Neg Hx     Review of Systems: The patient denies any heat or cold intolerance.  No weight gain or weight loss.  The patient denies headaches or blurry vision.  There is no cough or sputum production.  The patient denies dizziness.  There is no hematuria or hematochezia.  The patient denies any muscle aches or arthritis.  The patient denies any rash.  The patient denies frequent falling or instability.  There is no history of depression or anxiety.  All other systems were reviewed and are negative.   Physical Exam: Filed Vitals:   01/05/11 1128  BP: 136/70  Pulse: 70   Her weight is 149.  The general appearance reveals an elderly woman in no acute  distress.  She walks with a walker.Pupils equal and reactive.   Extraocular Movements are full.  There is no scleral icterus.  The mouth and pharynx are normal.  The neck is supple.  The carotids reveal no bruits.  The jugular venous pressure is normal.  The thyroid is not enlarged.  There is no lymphadenopathy.The chest is clear to percussion and auscultation. There are no rales or rhonchi. Expansion of the chest is symmetrical.The precordium is quiet.  The first heart sound is normal.  The second heart sound is physiologically split.  There is no murmur gallop rub or click.  There is no abnormal lift or heave.  The rhythm is regular.The abdomen is soft and nontender. Bowel sounds are normal. The liver and spleen are not enlarged. There Are no abdominal masses. There are no bruits.The pedal pulses are good.  There is no phlebitis or edema.  There is no cyanosis or clubbing.Strength is normal and symmetrical in all extremities.  There is no lateralizing weakness.  There are no sensory deficits.   Assessment / Plan: Return inMonthly intervals and when we see her for an office  visit in 3 months we'll get a protime and also TSH and free T4.

## 2011-01-19 ENCOUNTER — Ambulatory Visit (INDEPENDENT_AMBULATORY_CARE_PROVIDER_SITE_OTHER): Payer: Medicare Other | Admitting: *Deleted

## 2011-01-19 DIAGNOSIS — I82409 Acute embolism and thrombosis of unspecified deep veins of unspecified lower extremity: Secondary | ICD-10-CM

## 2011-01-19 LAB — POCT INR: INR: 2.9

## 2011-01-29 ENCOUNTER — Other Ambulatory Visit: Payer: Self-pay | Admitting: Cardiology

## 2011-01-29 DIAGNOSIS — Z1231 Encounter for screening mammogram for malignant neoplasm of breast: Secondary | ICD-10-CM

## 2011-02-12 ENCOUNTER — Ambulatory Visit (INDEPENDENT_AMBULATORY_CARE_PROVIDER_SITE_OTHER): Payer: Medicare Other | Admitting: *Deleted

## 2011-02-12 ENCOUNTER — Ambulatory Visit
Admission: RE | Admit: 2011-02-12 | Discharge: 2011-02-12 | Disposition: A | Discharge status: Home / Self Care | Payer: Medicare Other | Source: Ambulatory Visit | Attending: Cardiology | Admitting: Cardiology

## 2011-02-12 DIAGNOSIS — Z1231 Encounter for screening mammogram for malignant neoplasm of breast: Secondary | ICD-10-CM

## 2011-02-12 DIAGNOSIS — I82409 Acute embolism and thrombosis of unspecified deep veins of unspecified lower extremity: Secondary | ICD-10-CM

## 2011-02-12 LAB — POCT INR: INR: 2.2

## 2011-02-12 NOTE — Procedures (Signed)
[facility]  Patient:    Marissa Parsons, Marissa Parsons                     MRN: [id number] Proc. Date: 03/23/00 Adm. Date:  [id number] Attending:  Louie Bun CC:         Clovis Pu [name], [name]                           Procedure Report  PROCEDURE:  Esophagogastroduodenoscopy.  INDICATIONS FOR PROCEDURE:  Single episode of choking with possible food impaction. Barium swallow suggested some sort of cervical abnormality with a temporary hang up of the barium pill there.  The procedure is to better clarify this.  DESCRIPTION OF PROCEDURE:  The patient was placed in the left lateral decubitus  position and placed on the pulse monitor with continuous low flow oxygen delivered by nasal cannula.  She was sedated with 87.5 mcg of IV fentanyl and 5 mg of IV Versed.  The Olympus video endoscope was advanced under direct vision through the oropharynx and esophagus.  The esophagus was straight and of normal caliber at he squamocolumnar line at 39 cm. It was irregular and broken up with one or two islands of columnar epithelium above it, but with no definite erosions, exudate or ulcers.  There appeared to be a small sliding hiatal hernia distal to the Z-line. The stomach was entered and a small amount of liquid secretions were suctioned rom the fundus.  A retroflexed view of the cardia was unremarkable.  The fundus, body, antrum and pylorus all appeared normal.  The duodenum was entered and both the ulb and second portion were well inspected and appeared to be within normal limits.  The scope was brought back into the esophagus and very slowly withdrawn, with particular attention to the cervical esophagus all the way up to the hypopharynx. The scope was withdrawn very slowly, millimeter by millimeter, and the arytenoid cartilage and vallecula were encountered at approximately 15 cm.  The view of the esophagus all the way up to that point was entirely  normal.  There was no suggestion of intrinsic or extrinsic narrowing, ulcers, neoplasm or any other visible abnormality.  Photo documentation of the esophagus from 15-30 cm was obtained.  The scope was then withdrawn and the patient returned to the recovery room in stable condition.  She tolerated the procedure well and there were no immediate complications.  IMPRESSION:  Minimal distal esophagitis with no cervical esophageal abnormality  corresponding to the abnormality suggested on barium swallow.  PLAN:  Advance diet and monitor for development of further symptoms suggesting  persistent cervical abnormality.  Otherwise, expectant management alone. DD:  03/23/00 TD:  03/24/00 Job: [id number]

## 2011-02-12 NOTE — Op Note (Signed)
NAMESANISHA, CLANTON NO.:  [id number]   MEDICAL RECORD NO.:  [id number]          PATIENT TYPE:  INP   LOCATION:  1510                         FACILITY:  [facility]   PHYSICIAN:  Madlyn Frankel. Charlann Boxer, [name]  DATE OF BIRTH:  25-Feb-1929   DATE OF PROCEDURE:  01/25/2006  DATE OF DISCHARGE:                                 OPERATIVE REPORT   PREOPERATIVE DIAGNOSIS:  Unstable left intertrochanteric femur fracture.   POSTOPERATIVE DIAGNOSIS:  Unstable left intertrochanteric femur fracture.   OPERATION PERFORMED:  Open reduction internal fixation of left  intertrochanteric femur fracture utilizing a trochanteric Ace nail, 11 x 200  mm, 130 degrees with 120 mm lag screw, distal interlock and antirotational  screw placed.   SURGEON:  Madlyn Frankel. Charlann Boxer, [name]   ASSISTANTDruscilla Brownie. Cherlynn June.   ANESTHESIA:  General.   ESTIMATED BLOOD LOSS:  50 mL.   DRAINS:  None.   COMPLICATIONS:  None.   INDICATIONS FOR PROCEDURE:  Ms. Gering is a very pleasant 77 year old female  who presented to the emergency room on April 28.  I was on call that day and  was consulted for fall and hip fracture.  The patient was seen and evaluated  in the emergency room and noted to have an unstable 4-part intertrochanteric  femur fracture.  She is on Coumadin for a history of thrombophlebitis  treated by Dr. [name].  She was admitted to the hospital to allow her  INR to normalize without medical help.  Once the INR had stabilized at 1.5,  she was set up for OR.  The risks and benefits of the operative procedure  were reviewed including the risks of infection, shortening, compression and  persistent discomfort and limb postoperatively.  Consent was obtained.   DESCRIPTION OF PROCEDURE:  The patient was brought to the operating theater.  Once adequate anesthesia and preoperative antibiotics, 1 g vancomycin due to  PENICILLIN ALLERGY, were administered, the patient was positioned supine on  the fracture table.  The right lower extremity was flexed and adducted out  of the way with bony prominences padded.  The left lower extremity was  positioned in traction shoe with bony prominences padded.  Traction and  internal rotation was applied.  Fracture was reduced anatomically under  fluoroscopic guidance.  There was some concern about some anterior  displacement of the femoral shaft compared to the femoral neck region but  this was recognized and would be  addressed intraoperatively.  The left  proximal thigh was then prepped and draped in sterile fashion with shower  curtain technique.  A lateral based incision was made tip of the trochanter.  Sharp dissection was carried down through the gluteal fascia to the trip of  the trochanter.  A guidewire was positioned in the tip of the trochanter and  then passed slightly anteriorly to get into the femoral shaft.  This was  confirmed radiographically in AP and lateral plane.  Once the guidewire was  positioned at the appropriate depth, the proximal femur was reamed with a  17mm entry reamer.  The final 11  x 200 mm nail 130 degrees was then passed  by hand across the fracture site reducing the fracture anatomically.  This  was passed distally to the appropriate level.  The lag screw trocar was then  placed, guidewire was positioned.  It was positioned in the center of the  head on both AP and lateral planes.  I then measured the depth of the screw  to be long at 120 mm.  I then drilled this and then tapped it and then  placed a 120 mm lag screw and compressed it with excellent compression  through the medial calcar.   At this point the lag screw guide was removed and attention was directed to  an anterior rotational screw.  This was done per protocol and measured 90  mm.  This was screwed in without complication.   Radiographic confirmation in the AP and lateral planes was obtained.  At  this point the distal interlock was positioned,  a 36 mm screw was placed at  the static 200 mm mark.   The wounds were irrigated. Final radiographs were obtained.  The wounds were  closed in layers with #1 Vicryl in the gluteal fascia.  The remainder of the  wound was closed in layers with 4-0 Monocryl in the skin.  The hip was then  cleaned, dried and dressed sterilely with dressing sponges and tape.  The  patient was brought to recovery room in stable condition.      [name], [name]  Electronically Signed     MDO/MEDQ  D:  01/25/2006  T:  01/26/2006  Job:  [id number]

## 2011-02-12 NOTE — H&P (Signed)
Marissa Parsons, Marissa Parsons NO.:  [id number]   MEDICAL RECORD NO.:  [id number]          PATIENT TYPE:  INP   LOCATION:  0103                         FACILITY:  [facility]   PHYSICIAN:  Madlyn Frankel. Charlann Boxer, [name]  DATE OF BIRTH:  03-14-1929   DATE OF ADMISSION:  01/22/2006  DATE OF DISCHARGE:                                HISTORY & PHYSICAL   PRINCIPAL DIAGNOSIS:  Left intertrochanteric femur fracture.   HISTORY OF PRESENT ILLNESS:  [name] is a pleasant 77 year old female  with history of hypertension and thrombophlebitis who was in her normal  state of health at her house when she slipped on a wet floor landing on the  left hip.  She had immediate onset of pain and inability to bear weight.  She reports no other injuries, just complaining of hip pain, she is okay  when she is stable, with any kind of movement around the hip or left lower  extremity she has problems.  She discusses her medical history of  thrombophlebitis, history of significant venous congestion with good  arterial flow, history of recent stocking use.   PAST MEDICAL HISTORY:  Osteoarthritis, hypertension, thrombophlebitis.   FAMILY MEDICAL HISTORY:  Noncontributory.   SOCIAL HISTORY:  The patient lives alone with her nephew in her own  residence with family members in town.  She denies tobacco and alcohol use  and she is retired.   DRUG ALLERGIES:  DEMEROL AND PENICILLIN.   MEDICATIONS:  1.  Bisoprolol and hydrochlorothiazide 5/6.25 mg daily.  2.  Coumadin 2.5 mg daily except for Friday.  3.  Diazepam 5 mg daily.  4.  Hyzaar 100/25 p.o. daily.  5.  Prednisone 5 mg daily.  6.  Diltiazem CD 240 mg daily.   REVIEW OF SYSTEMS:  Otherwise unremarkable other than history of phlebitis  and recent stoppage of the use of elastic stockings.   PHYSICAL EXAMINATION:  VITAL SIGNS:  Temperature 98.2, pulse 62, blood  pressure 117/50.  GENERAL:  She is awake, alert and oriented, and cooperative.  She has got  a  very nice disposition.  HEAD AND NECK:  Exam reveals normal ocular motions, no slurred speech, no  evidence of any facial asymmetry.  Neck is clear to auscultation.  CHEST:  Clear to auscultation bilaterally.  HEART:  Regular rate and rhythm with no murmurs.  ABDOMEN:  Soft and nontender with positive bowel sounds.  EXTREMITY:  Examination reveals that she has palpable pulses, intact  sensibility distally, left lower extremity shortened and externally rotated.  She had significant bluish hue to bilateral feet consistent with deep venous  congestion and history of phlebitis.   She has no other injuries to the upper extremities, no significant bruising  about the left hip.  Any motion of the hip does produce pain.   RADIOGRAPHS:  Radiographs reveal an unstable left intertrochanteric femur  fracture.   LABORATORIES:  The patient was noted to have an INR of 2, hematocrit of 42,  and normal electrolytes.   IMPRESSION:  Closed left unstable intertrochanteric femur fracture.   PLAN:  The patient will be  admitted to orthopedic service.  The patient's  cardiologist is Dr. [name] and [facility] will be available for  consultation.  We will plan on surgery first part of the week once her INR  normalizes.  Risks and benefits have already been reviewed with the patient.      [name], [name]  Electronically Signed     MDO/MEDQ  D:  01/22/2006  T:  01/22/2006  Job:  [id number]

## 2011-02-12 NOTE — Discharge Summary (Signed)
NAMEROBYNNE, Marissa Parsons NO.:  [id number]   MEDICAL RECORD NO.:  [id number]          PATIENT TYPE:  INP   LOCATION:  1510                         FACILITY:  [facility]   PHYSICIAN:  Madlyn Frankel. Charlann Boxer, [name]  DATE OF BIRTH:  [birth year]   DATE OF ADMISSION:  01/22/2006  DATE OF DISCHARGE:  01/31/2006                                 DISCHARGE SUMMARY   ADMITTING DIAGNOSES:  1.  Closed left unstable intertrochanteric fracture.  2.  History of thrombophlebitis.  3.  Hypertension.   DISCHARGE DIAGNOSES:  1.  Closed left unstable intertrochanteric fracture.  2.  History of thrombophlebitis.  3.  Hypertension.  4.  Episode of orthostatic hypotension.   OPERATION:  On Jan 25, 2006 the patient underwent open reduction internal  fixation left intertrochanteric femoral fracture utilizing trochanteric ace  nailing intramedullary.  [name], [name]. assisted.   BRIEF HISTORY:  This 77 year old lady was seen in the emergency room on  April 28.  She had an unstable four-point intertrochanteric femoral  fracture.  Dr. [name] has run Coumadin for her history of  thrombophlebitis.  Due to the instability of the fracture and the fact she  could not ambulate on it she was admitted for surgical intervention.  We, of  course, had to wait for the INR to normalize prior to surgical procedure.  She was taken off the Coumadin at the time of admission.   Once her INR normalized to 1.3 she was taken to the operating room for the  above procedure.   HOSPITAL COURSE:  She tolerated the surgical procedure quite well, had no  marked reduction in her blood counts.  She was entered into physical therapy  slowly and was achieving ambulation with moderate assist.  She was  transferring to bedside commode.  She was able to ambulate about 20-40 feet  with continuing minimal to moderate assist.  Wounds in the lateral thigh  remained clean and dry.  Neurovascular remained intact to left lower  extremity.   [name] so that we would be  able to prevent any DVT or reoccurrence of phlebitis.  No critical incidents  were noted as far as her vascular system were concerned.  Calf remained soft  and Hoffman sign remained negative in both lower extremities.   As far as her discharge plans were concerned they were somewhat varied  depending on her wishes and the realistic ability for home care with her  twin sister versus an inpatient rehabilitation program.  She was highly  desires to return home.  However, her sister is on chronic oxygen and is, of  course, the same age at 15.  With home health it was thought she might be  maintained in her home.  However, Jan 30, 2006 when she got out of bed for  physical therapy she had an orthostatic hypotension episode, was dizzy, drop  in pressure.  She was returned to bed and normalized.  She has now resolved  herself that she will need an inpatient observed skilled program for her own  safety.  Today we are selecting such a program.  [facility] has been  mentioned due to the fact they have an excellent facility.  There are  certainly other facilities with a high level of rehabilitation reputation.   Dry dressing may be used to the left hip on an as needed basis.  She may  weight bear as tolerated.  The hip wounds and thigh wounds on the left were  closed with subcuticular sutures and Steri-Strips.  Dry dressing may be used  on an as needed basis.  If the patient is able she may shower with  assistance.  Continue with her level of activity she currently is enjoying  with caution of orthostatic hypotension.  She can continue with  weightbearing as tolerated.  Return to see Dr. Charlann Boxer two, two and a half  weeks after the date of surgery.  Appointment may be made by calling [phone].  Any medical problems should be directed towards Dr. [name].   DISCHARGE MEDICATIONS:  1.  Bisoprolol/HCTZ  5/6.25 tablet daily.  2.  Valium 5 mg daily.  3.  Cozaar 50 mg tablet daily.  4.  Hydrochlorothiazide 25 mg daily.  5.  Prednisone 5 mg daily.  6.  Tiazac/Cardizem CD/ER 240 mg cap daily.  7.  Coumadin per pharmacy protocol and Dr. [name] to keep the INR at      2.  8.  Ferrous sulfate 325 mg t.i.d.  9.  Tylenol p.r.n.  10. Senokot S tablet p.r.n.  11. Vicodin p.r.n. pain.  12. Robaxin 500 mg p.r.n. muscle spasm.      [name].      [name], [name]  Electronically Signed    DLU/MEDQ  D:  01/31/2006  T:  01/31/2006  Job:  [id number]

## 2011-02-16 ENCOUNTER — Encounter: Payer: Medicare Other | Admitting: *Deleted

## 2011-03-11 ENCOUNTER — Encounter: Payer: Medicare Other | Admitting: *Deleted

## 2011-03-12 ENCOUNTER — Ambulatory Visit (INDEPENDENT_AMBULATORY_CARE_PROVIDER_SITE_OTHER): Payer: Medicare Other | Admitting: *Deleted

## 2011-03-12 DIAGNOSIS — I82409 Acute embolism and thrombosis of unspecified deep veins of unspecified lower extremity: Secondary | ICD-10-CM

## 2011-04-05 ENCOUNTER — Telehealth: Payer: Self-pay | Admitting: Cardiology

## 2011-04-05 NOTE — Telephone Encounter (Signed)
Received call from patient.

## 2011-04-05 NOTE — Telephone Encounter (Signed)
Recvd call from patient.  She is going to have surgery 1 wk from tomorrow on her leg.  She wanted to know if she can get her coumadin checked at the hospital.  Please call, she needs to speak to the surgeon and let them know.  Patient understood that she may not get call back until tomorrow.

## 2011-04-06 ENCOUNTER — Telehealth: Payer: Self-pay | Admitting: Cardiology

## 2011-04-06 NOTE — Telephone Encounter (Signed)
FAX [phone], [phone]  ATN:  LORRAINE NEED LAST OV, EKG, STRESS AND ECHO.

## 2011-04-06 NOTE — Telephone Encounter (Signed)
Cancelled protime secondary to holding coumadin for upcoming procedure.  Will check and see if she needs to get checked prior to procedure.

## 2011-04-09 ENCOUNTER — Encounter: Payer: Medicare Other | Admitting: *Deleted

## 2011-04-09 ENCOUNTER — Ambulatory Visit (INDEPENDENT_AMBULATORY_CARE_PROVIDER_SITE_OTHER): Payer: Medicare Other | Admitting: *Deleted

## 2011-04-09 DIAGNOSIS — I82409 Acute embolism and thrombosis of unspecified deep veins of unspecified lower extremity: Secondary | ICD-10-CM

## 2011-04-09 LAB — POCT INR: INR: 2

## 2011-04-12 ENCOUNTER — Encounter (HOSPITAL_BASED_OUTPATIENT_CLINIC_OR_DEPARTMENT_OTHER)
Admission: RE | Admit: 2011-04-12 | Discharge: 2011-04-12 | Disposition: A | Discharge status: Home / Self Care | Payer: Medicare Other | Source: Ambulatory Visit | Attending: Orthopaedic Surgery | Admitting: Orthopaedic Surgery

## 2011-04-12 LAB — BASIC METABOLIC PANEL
BUN: 20 mg/dL (ref 6–23)
CO2: 32 mEq/L (ref 19–32)
Chloride: 101 mEq/L (ref 96–112)
Creatinine, Ser: 0.7 mg/dL (ref 0.50–1.10)
Glucose, Bld: 81 mg/dL (ref 70–99)

## 2011-04-12 LAB — APTT: aPTT: 26 seconds (ref 24–37)

## 2011-04-13 ENCOUNTER — Other Ambulatory Visit: Payer: Self-pay | Admitting: Orthopaedic Surgery

## 2011-04-13 ENCOUNTER — Ambulatory Visit (HOSPITAL_BASED_OUTPATIENT_CLINIC_OR_DEPARTMENT_OTHER)
Admission: RE | Admit: 2011-04-13 | Discharge: 2011-04-13 | Disposition: A | Discharge status: Home / Self Care | Payer: Medicare Other | Source: Ambulatory Visit | Attending: Orthopaedic Surgery | Admitting: Orthopaedic Surgery

## 2011-04-13 DIAGNOSIS — Z01812 Encounter for preprocedural laboratory examination: Secondary | ICD-10-CM | POA: Insufficient documentation

## 2011-04-13 DIAGNOSIS — J449 Chronic obstructive pulmonary disease, unspecified: Secondary | ICD-10-CM | POA: Insufficient documentation

## 2011-04-13 DIAGNOSIS — J4489 Other specified chronic obstructive pulmonary disease: Secondary | ICD-10-CM | POA: Insufficient documentation

## 2011-04-13 DIAGNOSIS — Z86718 Personal history of other venous thrombosis and embolism: Secondary | ICD-10-CM | POA: Insufficient documentation

## 2011-04-13 DIAGNOSIS — M799 Soft tissue disorder, unspecified: Secondary | ICD-10-CM | POA: Insufficient documentation

## 2011-04-13 DIAGNOSIS — E039 Hypothyroidism, unspecified: Secondary | ICD-10-CM | POA: Insufficient documentation

## 2011-04-13 DIAGNOSIS — Z79899 Other long term (current) drug therapy: Secondary | ICD-10-CM | POA: Insufficient documentation

## 2011-04-13 DIAGNOSIS — I10 Essential (primary) hypertension: Secondary | ICD-10-CM | POA: Insufficient documentation

## 2011-04-13 DIAGNOSIS — Z01810 Encounter for preprocedural cardiovascular examination: Secondary | ICD-10-CM | POA: Insufficient documentation

## 2011-04-13 LAB — POCT HEMOGLOBIN-HEMACUE: Hemoglobin: 15.9 g/dL — ABNORMAL HIGH (ref 12.0–15.0)

## 2011-04-14 ENCOUNTER — Telehealth: Payer: Self-pay | Admitting: *Deleted

## 2011-04-14 NOTE — Telephone Encounter (Signed)
Patient had procedure on ankle and was advised to start coumadin back yesterday.  Discussed with  Dr. [name] and it is ok to wait and recheck INR at 8/3 office visit.  Advised to call before then if she has any problems

## 2011-04-28 NOTE — Telephone Encounter (Signed)
Already faxed.

## 2011-04-30 ENCOUNTER — Ambulatory Visit (INDEPENDENT_AMBULATORY_CARE_PROVIDER_SITE_OTHER): Payer: Medicare Other | Admitting: Cardiology

## 2011-04-30 ENCOUNTER — Ambulatory Visit (INDEPENDENT_AMBULATORY_CARE_PROVIDER_SITE_OTHER): Payer: Medicare Other | Admitting: *Deleted

## 2011-04-30 ENCOUNTER — Encounter: Payer: Self-pay | Admitting: Cardiology

## 2011-04-30 DIAGNOSIS — I119 Hypertensive heart disease without heart failure: Secondary | ICD-10-CM

## 2011-04-30 DIAGNOSIS — I82409 Acute embolism and thrombosis of unspecified deep veins of unspecified lower extremity: Secondary | ICD-10-CM

## 2011-04-30 LAB — POCT INR: INR: 1.9

## 2011-04-30 NOTE — Assessment & Plan Note (Signed)
Generally the patient's blood pressure has been stable.  She had a recent episode during the night were her blood pressure shot up after she awoke with left arm numbness which turned out to be secondary to laying on her arm.  We told her today that if she does have a spike in her blood pressure she can also always take an extra Ziac p.r.n. To help bring the blood pressure down more quickly

## 2011-04-30 NOTE — Progress Notes (Signed)
Marissa Parsons Date of Birth:  1929/06/25 [facility] [address]   [address] [name], [place]  [address] [phone]           Fax   [phone]  HPI: This pleasant 77 year old woman is seen for a scheduled followup office visit.  She has a past history of chronic deep vein phlebitis.  She is on long-term Coumadin.  She also has a history of essential hypertension.  Last week she awoke at 4 AM with her left arm feeling numb.  She summoned a neighbor who came over and checked her blood pressure which was found to be high.  They did call EMS who came out and checked her and found that she was okay and that her left arm numbness was probably from having slept on her arm.  The numbness resolved and as it did her blood pressure came down to normal.  Since we last saw the patient she has had successful orthopedic surgery by Dr. Freida Busman or Fu removed a growth from her left ankle.  It was benign.  The patient has a history of hypothyroidism.  She is on Synthroid.  We will plan to recheck a thyroid function panel on her next visit.  Current Outpatient Prescriptions  Medication Sig Dispense Refill  . acetaminophen (TYLENOL) 325 MG tablet Take 650 mg by mouth every 6 (six) hours as needed.        . bisoprolol-hydrochlorothiazide (ZIAC) 5-6.25 MG per tablet Take 1 tablet by mouth daily.        . diazepam (VALIUM) 5 MG tablet Take 5 mg by mouth every 6 (six) hours as needed.        . diltiazem (CARDIZEM CD) 240 MG 24 hr capsule Take 240 mg by mouth daily.        . hydrochlorothiazide 25 MG tablet Take 25 mg by mouth daily.        Marland Kitchen levothyroxine (SYNTHROID, LEVOTHROID) 50 MCG tablet Take 50 mcg by mouth daily. Take 3 tabs Mon and Wed. ;   1 tablet 4 all others      . predniSONE (DELTASONE) 5 MG tablet Take 5 mg by mouth daily.        Marland Kitchen warfarin (COUMADIN) 5 MG tablet Take 5 mg by mouth daily.          Allergies  Allergen Reactions  . Keflex   . Morphine And Related    nausea  . Penicillins   . Sinutab (Sine-Off)   . Skelaxin   . Tetanus Toxoids   . Zestril (Lisinopril)     Patient Active Problem List  Diagnoses  . Hypothyroidism  . Osteoporosis  . Macular degeneration  . Deep vein thrombosis (DVT)  . Benign hypertensive heart disease without heart failure  . Osteoarthritis    History  Smoking status  . Never Smoker   Smokeless tobacco  . Not on file    History  Alcohol Use No    Family History  Problem Relation Age of Onset  . Abnormal EKG Neg Hx   . Abnormal newborn screen Neg Hx   . Achalasia Neg Hx   . Achondroplasia Neg Hx   . Acne Neg Hx   . Acromegaly Neg Hx   . Actinic keratosis Neg Hx   . Acute lymphoblastic leukemia Neg Hx   . Addison's disease Neg Hx   . Adrenal disorder Neg Hx   . Albinism Neg Hx   . Alcohol abuse Neg  Hx   . Alkaptonuria Neg Hx   . Allergic rhinitis Neg Hx   . Allergies Neg Hx   . Allergy (severe) Neg Hx   . Alopecia Neg Hx   . Alpha-1 antitrypsin deficiency Neg Hx   . Alport syndrome Neg Hx   . ALS Neg Hx   . Alzheimer's disease Neg Hx   . Ambiguous genitalia Neg Hx   . Amblyopia Neg Hx   . Amenorrhea Neg Hx   . Anal fissures Neg Hx   . Anemia Neg Hx   . Anesthesia problems Neg Hx   . Aneurysm Neg Hx   . Angelman syndrome Neg Hx   . Angina Neg Hx   . Angioedema Neg Hx   . Ankylosing spondylitis Neg Hx   . Anorectal malformation Neg Hx   . Anorexia nervosa Neg Hx   . Anti-cardiolipin syndrome Neg Hx   . Antithrombin III deficiency Neg Hx   . Anuerysm Neg Hx   . Anxiety disorder Neg Hx   . Aortic aneurysm Neg Hx   . Aortic dissection Neg Hx   . Aortic stenosis Neg Hx   . Aphthous stomatitis Neg Hx   . Aplastic anemia Neg Hx   . Appendicitis Neg Hx   . Apraxia Neg Hx   . Arnold-Chiari malformation Neg Hx   . Arrhythmia Neg Hx   . Arthritis Neg Hx   . Asperger's syndrome Neg Hx   . Asthma Neg Hx   . Ataxia Neg Hx   . Ataxia telangiectasia Neg Hx   . Atopy Neg Hx   . Atrial  fibrillation Neg Hx   . Atrophic kidney Neg Hx   . Auditory processing disorder Neg Hx   . Autism Neg Hx   . Autism spectrum disorder Neg Hx   . Autoimmune disease Neg Hx   . AVM Neg Hx   . Baker's cyst Neg Hx   . Barrett's esophagus Neg Hx   . Bartter's syndrome Neg Hx   . Basal cell carcinoma Neg Hx   . Behavior problems Neg Hx   . Bell's palsy Neg Hx   . Benign prostatic hyperplasia Neg Hx   . Bipolar disorder Neg Hx   . Birth defects Neg Hx   . Birth marks Neg Hx   . Blindness Neg Hx   . Bone cancer Neg Hx   . BOR syndrome Neg Hx   . Bow legs Neg Hx   . Bradycardia Neg Hx   . Brain cancer Neg Hx   . BRCA 1/2 Neg Hx   . Breast cancer Neg Hx   . Broken bones Neg Hx   . Bronchiolitis Neg Hx   . Bronchopulmonary dysplasia Neg Hx   . Bruton's disease Neg Hx   . Bulemia Neg Hx   . Bullous pemphigoid Neg Hx   . Bunion Neg Hx   . Bursitis Neg Hx   . Caf-au-lait spots Neg Hx   . Calcium disorder Neg Hx   . Canavan disease Neg Hx   . Cancer Neg Hx   . Cardiomyopathy Neg Hx   . Carpal tunnel syndrome Neg Hx   . Cataracts Neg Hx   . Celiac disease Neg Hx   . Cerebral aneurysm Neg Hx   . Cerebral palsy Neg Hx   . Cervical cancer Neg Hx   . Cervical polyp Neg Hx   . Cervicitis Neg Hx   . Chalasia Neg Hx   . Chalazion Neg Hx   .  Charcot-Marie-Tooth disease Neg Hx   . Chediak-Higashi syndrome Neg Hx   . Chiari malformation Neg Hx   . Childhood respiratory disease Neg Hx   . Choanal atresia Neg Hx   . Cholecystitis Neg Hx   . Cholelithiasis Neg Hx   . Cholesteatoma Neg Hx   . Chondromalacia Neg Hx   . Chorea Neg Hx   . Chromosomal disorder Neg Hx   . Chronic bronchitis Neg Hx   . Chronic fatigue Neg Hx   . Chronic granulomatous disease Neg Hx   . Chronic infections Neg Hx   . Cirrhosis Neg Hx   . Cleft lip Neg Hx   . Cleft palate Neg Hx   . Clotting disorder Neg Hx   . Club foot Neg Hx   . Coarctation of the aorta Neg Hx   . Colitis Neg Hx   . Collagen  disease Neg Hx   . Colon cancer Neg Hx   . Colon polyps Neg Hx   . Colonic polyp Neg Hx   . Color blindness Neg Hx   . Conduct disorder Neg Hx   . Conductive hearing loss Neg Hx   . Congenital adrenal hyperplasia Neg Hx   . Congenital heart disease Neg Hx   . Conjunctivitis Neg Hx   . Consanguinity Neg Hx   . Constipation Neg Hx   . COPD Neg Hx   . Corneal abrasion Neg Hx   . Corneal ulcer Neg Hx   . Coronary aneurysm Neg Hx   . Coronary artery disease Neg Hx   . Cowden syndrome Neg Hx   . Craniosynostosis Neg Hx   . Cri-du-chat syndrome Neg Hx   . Crohn's disease Neg Hx   . Cushing syndrome Neg Hx   . Cystic fibrosis Neg Hx   . Cystic kidney disease Neg Hx   . Cystinosis Neg Hx   . Decreased libido Neg Hx   . Deep vein thrombosis Neg Hx   . Delayed menopause Neg Hx   . Delayed puberty Neg Hx   . Dementia Neg Hx   . Dental caries Neg Hx   . Depression Neg Hx   . Dermatomyositis Neg Hx   . DES usage Neg Hx   . Developmental delay Neg Hx   . Diabetes Neg Hx   . Diabetes insipidus Neg Hx   . Diabetes type I Neg Hx   . Diabetes type II Neg Hx   . Diabetic kidney disease Neg Hx   . DiGeorge syndrome Neg Hx   . Dilated cardiomyopathy Neg Hx   . Dislocations Neg Hx   . Diverticulitis Neg Hx   . Diverticulosis Neg Hx   . Down syndrome Neg Hx   . Drug abuse Neg Hx   . Dupuytren's contracture Neg Hx   . Dwarfism Neg Hx   . Dysfunctional uterine bleeding Neg Hx   . Dysmenorrhea Neg Hx   . Dyspareunia Neg Hx   . Dysphagia Neg Hx   . Dysrhythmia Neg Hx   . Dystonia Neg Hx   . Early death Neg Hx   . Early menopause Neg Hx   . Early puberty Neg Hx   . Eating disorder Neg Hx   . Eclampsia Neg Hx   . Ectodermal dysplasia Neg Hx   . Eczema Neg Hx   . Edema Neg Hx   . Edward's syndrome Neg Hx   . Ehlers-Danlos syndrome Neg Hx   . Emotional abuse Neg Hx   .  Emphysema Neg Hx   . Encopresis Neg Hx   . Endocrine tumor Neg Hx   . Endocrinopathy Neg Hx   . Endometrial  cancer Neg Hx   . Endometriosis Neg Hx   . Enuresis Neg Hx   . Eosinophilic granuloma Neg Hx   . Epididymitis Neg Hx   . Erectile dysfunction Neg Hx   . Erythema nodosum Neg Hx   . Esophageal cancer Neg Hx   . Esophageal varices Neg Hx   . Esophagitis Neg Hx   . Exostosis Neg Hx   . Fabry's disease Neg Hx   . Factor IX deficiency Neg Hx   . Factor V Leiden deficiency Neg Hx   . Factor VIII deficiency Neg Hx   . Failure to thrive Neg Hx   . Fainting Neg Hx   . Familial dysautonomia Neg Hx   . Familial nephritis Neg Hx   . Familial polyposis Neg Hx   . Fanconi anemia Neg Hx   . Febrile seizures Neg Hx   . Fibrocystic breast disease Neg Hx   . Fibroids Neg Hx   . Fibromyalgia Neg Hx   . Food intolerance Neg Hx   . Fragile X syndrome Neg Hx   . Friedreich's ataxia Neg Hx   . Frontotemporal dementia Neg Hx   . Fuch's dystrophy Neg Hx   . Gait disorder Neg Hx   . Galactosemia Neg Hx   . Gallbladder disease Neg Hx   . Gaucher's disease Neg Hx   . Genodermatoses Neg Hx   . GER disease Neg Hx   . Gestational diabetes Neg Hx   . GI problems Neg Hx   . Glaucoma Neg Hx   . Glomerulonephritis Neg Hx   . Glucose-6-phos deficiency Neg Hx   . Glycogen storage disease Neg Hx   . Goiter Neg Hx   . Gonadal disorder Neg Hx   . Gout Neg Hx   . Graves' disease Neg Hx   . Growth hormone deficiency Neg Hx   . GU problems Neg Hx   . Hammer toes Neg Hx   . Hartnup's disease Neg Hx   . Hashimoto's thyroiditis Neg Hx   . Healthy Neg Hx   . Hearing loss Neg Hx   . Heart attack Neg Hx   . Heart block Neg Hx   . Heart defect Neg Hx   . Heart disease Neg Hx   . Heart failure Neg Hx   . Heart murmur Neg Hx   . Hemangiomas Neg Hx   . Hematuria Neg Hx   . Hemochromatosis Neg Hx   . Hemolytic uremic syndrome Neg Hx   . Hemophilia Neg Hx   . Henoch-Schonlein purpura Neg Hx   . Hepatitis Neg Hx   . Hepatomegaly Neg Hx   . Hereditary spherocytosis Neg Hx   . Hernia Neg Hx   . Hiatal  hernia Neg Hx   . High arches Neg Hx   . Hip dysplasia Neg Hx   . Hip fracture Neg Hx   . Hirschsprung's disease Neg Hx   . Hirsutism Neg Hx   . Histiocytosis X Neg Hx   . HIV Neg Hx   . HLA-B27 positive Neg Hx   . Hodgkin's lymphoma Neg Hx   . Homocystinuria Neg Hx   . Hunter's disease Neg Hx   . Huntington's disease Neg Hx   . Hydrocele Neg Hx   . Hydrocephalus Neg Hx   . Hygroma Neg  Hx   . Hypercalcemia Neg Hx   . Hypereosinophilic syndrome Neg Hx   . Hyperinsulinemia Neg Hx   . Hyperkalemia Neg Hx   . Hyperlipidemia Neg Hx   . Hypermobility Neg Hx   . Hypernasality Neg Hx   . Hyperopia Neg Hx   . Hyperparathyroidism Neg Hx   . Hypersomnolence Neg Hx   . Hypertension Neg Hx   . Hyperthyroidism Neg Hx   . Hypertrophic cardiomyopathy Neg Hx   . Hypokalemia Neg Hx   . Hypoparathyroidism Neg Hx   . Hypoplastic kidney Neg Hx   . Hypotension Neg Hx   . Hypothyroidism Neg Hx   . Idiopathic pulmonary fibrosis Neg Hx   . Idiopathic torsion dystonia Neg Hx   . IgA nephropathy Neg Hx   . Immunodeficiency Neg Hx   . Imperforate anus Neg Hx   . Impotence Neg Hx   . Impulse control disorder Neg Hx   . Incompetent cervix Neg Hx   . Infertility Neg Hx   . Inflammatory bowel disease Neg Hx   . Inguinal hernia Neg Hx   . Insomnia Neg Hx   . Insulin resistance Neg Hx   . Interstitial cystitis Neg Hx   . Intestinal malrotation Neg Hx   . Intestinal polyp Neg Hx   . Intracerebral hemorrhage Neg Hx   . Iron deficiency Neg Hx   . Irregular heart beat Neg Hx   . Irritable bowel syndrome Neg Hx   . Jaundice Neg Hx   . Job's syndrome Neg Hx   . Joint hypermobility Neg Hx   . Juvenile idiopathic arthritis Neg Hx   . Kartagener's syndrome Neg Hx   . Kawasaki disease Neg Hx   . Keloids Neg Hx   . Kennedy's disease Neg Hx   . Keratoconus Neg Hx   . Kidney cancer Neg Hx   . Kidney disease Neg Hx   . Kidney failure Neg Hx   . Kidney nephrosis Neg Hx   . Klinefelter's syndrome  Neg Hx   . Krabbe disease Neg Hx   . Kyphosis Neg Hx   . Labyrinthitis Neg Hx   . Lactose intolerance Neg Hx   . Language disorder Neg Hx   . Lead poisoning Neg Hx   . Learning disabilities Neg Hx   . Legg-Calve-Perthes disease Neg Hx   . Lesch-Nyhan syndrome Neg Hx   . Leukemia Neg Hx   . Lichen planus Neg Hx   . Li-Fraumeni syndrome Neg Hx   . Liver cancer Neg Hx   . Liver disease Neg Hx   . Long QT syndrome Neg Hx   . Lumbar disc disease Neg Hx   . Lung cancer Neg Hx   . Lung disease Neg Hx   . Lupus Neg Hx   . Lymphoma Neg Hx   . Macrocephaly Neg Hx   . Macrosomia Neg Hx   . Macular degeneration Neg Hx   . Malignant hypertension Neg Hx   . Malignant hyperthermia Neg Hx   . Maple syrup urine disease Neg Hx   . Marfan syndrome Neg Hx   . Mastocytosis Neg Hx   . Melanoma Neg Hx   . Memory loss Neg Hx   . Meniere's disease Neg Hx   . Menstrual problems Neg Hx   . Mental illness Neg Hx   . Mental retardation Neg Hx   . Metabolic syndrome Neg Hx   . Microcephaly Neg Hx   .  Migraines Neg Hx   . Milk intolerance Neg Hx   . Miscarriages / Stillbirths Neg Hx   . Mitochondrial disorder Neg Hx   . Mitral valve prolapse Neg Hx   . Motor neuron disease Neg Hx   . Movement disorder Neg Hx   . Moyamoya disease Neg Hx   . Multiple births Neg Hx   . Multiple endocrine neoplasia Neg Hx   . Multiple fractures Neg Hx   . Multiple myeloma Neg Hx   . Multiple sclerosis Neg Hx   . Muscle cancer Neg Hx   . Muscular dystrophy Neg Hx   . Myasthenia gravis Neg Hx   . Myelodysplastic syndrome Neg Hx   . Myocarditis Neg Hx   . Myoclonus Neg Hx   . Myopathy Neg Hx   . Nail disease Neg Hx   . Narcolepsy Neg Hx   . Nephritis Neg Hx   . Nephrolithiasis Neg Hx   . Nephrotic syndrome Neg Hx   . Neural tube defect Neg Hx   . Neurodegenerative disease Neg Hx   . Neurofibromatosis Neg Hx   . Neuromuscular disorder Neg Hx   . Neuropathy Neg Hx   . Neutropenia Neg Hx   . Nevi Neg Hx     . Niemann-Pick disease Neg Hx   . Night blindness Neg Hx   . Nocturnal enuresis Neg Hx   . Nystagmus Neg Hx   . Obesity Neg Hx   . OCD Neg Hx   . ODD Neg Hx   . Orchitis Neg Hx   . Osler-Weber-Rendu syndrome Neg Hx   . Osteoarthritis Neg Hx   . Osteochondroma Neg Hx   . Osteogenesis imperfecta Neg Hx   . Osteopenia Neg Hx   . Osteoporosis Neg Hx   . Osteosarcoma Neg Hx   . Osteosclerosis Neg Hx   . Other Neg Hx   . Otitis media Neg Hx   . Ovarian cancer Neg Hx   . Ovarian cysts Neg Hx   . Oxalosis Neg Hx   . Paget's disease of bone Neg Hx   . Pancreatic cancer Neg Hx   . Pancreatitis Neg Hx   . Panhypopituitarism Neg Hx   . Panic disorder Neg Hx   . Paranoid behavior Neg Hx   . Parasomnia Neg Hx   . Parkinsonism Neg Hx   . Patau's syndrome Neg Hx   . Pathological gambling Neg Hx   . PDD Neg Hx   . Pectus carinatum Neg Hx   . Pectus excavatum Neg Hx   . Pelvic inflammatory disease Neg Hx   . Pemphigus vulgaris Neg Hx   . Penile cancer Neg Hx   . Periodic limb movement Neg Hx   . Peripheral vascular disease Neg Hx   . Pernicious anemia Neg Hx   . Personality disorder Neg Hx   . Pes cavus Neg Hx   . Physical abuse Neg Hx   . Otilio Jefferson sequence Neg Hx   . PKU Neg Hx   . Plantar fasciitis Neg Hx   . Pleurisy Neg Hx   . Pneumonia Neg Hx   . Polychondritis Neg Hx   . Polycystic kidney disease Neg Hx   . Polycystic ovary syndrome Neg Hx   . Polycythemia Neg Hx   . Polymyalgia rheumatica Neg Hx   . Polymyositis Neg Hx   . Pompe disease Neg Hx   . Positive PPD/TB Exposure Neg Hx   . Posterior urethral valves  Neg Hx   . Post-traumatic stress disorder Neg Hx   . Prader-Willi syndrome Neg Hx   . Premature birth Neg Hx   . Premature ovarian failure Neg Hx   . Preterm labor Neg Hx   . Prolactinoma Neg Hx   . Prostate cancer Neg Hx   . Prostatitis Neg Hx   . Protein C deficiency Neg Hx   . Protein S deficiency Neg Hx   . Proteinuria Neg Hx   . Prune belly  syndrome Neg Hx   . Pruritis Neg Hx   . Pseudochol deficiency Neg Hx   . Pseudotumor cerebri Neg Hx   . Psoriasis Neg Hx   . Psychosis Neg Hx   . Ptosis Neg Hx   . Pulmonary embolism Neg Hx   . Pulmonary fibrosis Neg Hx   . Pyelonephritis Neg Hx   . Pyloric stenosis Neg Hx   . Pyruvate dehydrogenase deficiency Neg Hx   . Rashes / Skin problems Neg Hx   . Raynaud syndrome Neg Hx   . Rectal cancer Neg Hx   . Recurrent abdominal pain Neg Hx   . Reiter's syndrome Neg Hx   . Renal tubular acidosis Neg Hx   . Restless legs syndrome Neg Hx   . Retinal degeneration Neg Hx   . Retinal detachment Neg Hx   . Retinitis pigmentosa Neg Hx   . Retinoblastoma Neg Hx   . Retinopathy of prematurity Neg Hx   . Reye's syndrome Neg Hx   . Rheum arthritis Neg Hx   . Rheumatic fever Neg Hx   . Rheumatologic disease Neg Hx   . Rickets Neg Hx   . Rosacea Neg Hx   . Sacroiliitis Neg Hx   . Sarcoidosis Neg Hx   . Schizophrenia Neg Hx   . Scleritis Neg Hx   . Scleroderma Neg Hx   . Scoliosis Neg Hx   . Seasonal affective disorder Neg Hx   . Seizures Neg Hx   . Selective mutism Neg Hx   . Sensorineural hearing loss Neg Hx   . Severe combined immunodeficiency Neg Hx   . Severe sprains Neg Hx   . Sexual abuse Neg Hx   . Short stature Neg Hx   . Sick sinus syndrome Neg Hx   . Sickle cell anemia Neg Hx   . Sickle cell trait Neg Hx   . SIDS Neg Hx   . Single kidney Neg Hx   . Sinusitis Neg Hx   . Sjogren's syndrome Neg Hx   . Skeletal dysplasia Neg Hx   . Skin cancer Neg Hx   . Skin telangiectasia Neg Hx   . Sleep apnea Neg Hx   . Sleep disorder Neg Hx   . Sleep walking Neg Hx   . Snoring Neg Hx   . Social phobia Neg Hx   . Speech disorder Neg Hx   . Spina bifida Neg Hx   . Spinal muscular atrophy Neg Hx   . Splenomegaly Neg Hx   . Spondyloarthropathy Neg Hx   . Spondylolisthesis Neg Hx   . Spondylolysis Neg Hx   . Squamous cell carcinoma Neg Hx   . Stevens-Johnson syndrome Neg Hx     . Stickler syndrome Neg Hx   . Stomach cancer Neg Hx   . Strabismus Neg Hx   . Stroke Neg Hx   . Stuttering Neg Hx   . Subarachnoid hemorrhage Neg Hx   . Sudden death Neg Hx   .  Suicidality Neg Hx   . Supraventricular tachycardia Neg Hx   . Swallowing difficulties Neg Hx   . Tall stature Neg Hx   . Tay-Sachs disease Neg Hx   . Testicular cancer Neg Hx   . Thalassemia Neg Hx   . Throat cancer Neg Hx   . Thrombocytopenia Neg Hx   . Thrombophilia Neg Hx   . Thrombophlebitis Neg Hx   . Thrombosis Neg Hx   . Thymic aplasia Neg Hx   . Thyroid cancer Neg Hx   . Thyroid disease Neg Hx   . Thyroid nodules Neg Hx   . Tics Neg Hx   . Tongue cancer Neg Hx   . Torticollis Neg Hx   . Tourette syndrome Neg Hx   . Tracheal cancer Neg Hx   . Tracheoesophageal fistual Neg Hx   . Tracheomalacia Neg Hx   . Transient ischemic attack Neg Hx   . Treacher Collins syndrome Neg Hx   . Tremor Neg Hx   . Tuberculosis Neg Hx   . Tuberous sclerosis Neg Hx   . Turner syndrome Neg Hx   . Ulcerative colitis Neg Hx   . Ulcers Neg Hx   . Undescended testes Neg Hx   . Unexplained death Neg Hx   . Urinary tract infection Neg Hx   . Urolithiasis Neg Hx   . Urticaria Neg Hx   . Uterine cancer Neg Hx   . Uveitis Neg Hx   . Vaginal cancer Neg Hx   . Valvular heart disease Neg Hx   . Vasculitis Neg Hx   . Velocardiofacial syndrome Neg Hx   . Venous thrombosis Neg Hx   . Verruca vulgaris  Neg Hx   . Vesicoureteral reflux Neg Hx   . Vision loss Neg Hx   . Vitamin D deficiency Neg Hx   . Vitiligo Neg Hx   . Voice disorder Neg Hx   . Von Gierke's disease Neg Hx   . Von Hippel-Lindau syndrome Neg Hx   . Von Willebrand disease Neg Hx   . Werdnig Hoffman Disease Neg Hx   . Williams syndrome Neg Hx   . Wilm's tumor Neg Hx   . Wilson's disease Neg Hx   . Wiskott-Aldrich syndrome Neg Hx   . Evelene Croon Parkinson White syndrome Neg Hx   . Xeroderma pigmentosa Neg Hx     Review of Systems: The patient  denies any heat or cold intolerance.  No weight gain or weight loss.  The patient denies headaches or blurry vision.  There is no cough or sputum production.  The patient denies dizziness.  There is no hematuria or hematochezia.  The patient denies any muscle aches or arthritis.  The patient denies any rash.  The patient denies frequent falling or instability.  There is no history of depression or anxiety.  All other systems were reviewed and are negative.   Physical Exam: Filed Vitals:   04/30/11 1007  BP: 136/75  Pulse: 70   The general appearance feels a well-developed well-nourished woman in no distress.  She has very poor vision because of macular degeneration.Pupils equal and reactive.   Extraocular Movements are full.  There is no scleral icterus.  The mouth and pharynx are normal.  The neck is supple.  The carotids reveal no bruits.  The jugular venous pressure is normal.  The thyroid is not enlarged.  There is no lymphadenopathy.  The chest is clear to percussion and auscultation. There are no rales  or rhonchi. Expansion of the chest is symmetrical.  The precordium is quiet.  The first heart sound is normal.  The second heart sound is physiologically split.  There is no murmur gallop rub or click.  There is no abnormal lift or heave.The rhythm is regularThe abdomen is soft and nontender. Bowel sounds are normal. The liver and spleen are not enlarged. There Are no abdominal masses. There are no bruits.  The pedal pulses are good.  There is no phlebitis or edema.  There is no cyanosis or clubbing.The postoperative site is healing well.       Assessment / Plan: Continue same medication.  Recheck in 3 months for office visit protime lipid panel hepatic function panel basal metabolic panel free T4 and TSH

## 2011-04-30 NOTE — Assessment & Plan Note (Signed)
The patient remains on Coumadin.  She has had no recurrence of her symptoms of deep vein thrombosis.  She recently underwent successful orthopedic surgery on her left ankle by Dr. Yisroel Ramming

## 2011-05-12 ENCOUNTER — Other Ambulatory Visit: Payer: Self-pay | Admitting: Cardiology

## 2011-05-12 DIAGNOSIS — F419 Anxiety disorder, unspecified: Secondary | ICD-10-CM

## 2011-05-12 NOTE — Op Note (Signed)
Marissa Parsons, TYREE NO.:  [id number]  MEDICAL RECORD NO.:  [id number]  LOCATION:                                 FACILITY:  PHYSICIAN:  Lubertha Basque. Jerl Santos, [name]     DATE OF BIRTH:  DATE OF PROCEDURE:  04/13/2011 DATE OF DISCHARGE:                              OPERATIVE REPORT   PREOPERATIVE DIAGNOSIS:  Left ankle mass.  POSTOPERATIVE DIAGNOSIS:  Left ankle mass.  PROCEDURE:  Excision of mass, left ankle.  ANESTHESIA:  Local and MAC.  ATTENDING SURGEON:  Lubertha Basque. Jerl Santos, MD  ASSISTANT:  Lindwood Qua, PA   INDICATIONS FOR PROCEDURE:  The patient is an 77 year old woman with several years of a slowly enlarging mass on the lateral aspect of her left ankle.  This has become painful to her.  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 Nevertheless, she has pain with use of her ankle and some pain at rest and she is offered excision.  Informed operative consent was obtained after discussion of possible complications including reaction to anesthesia and infection.  SUMMARY OF FINDINGS AND PROCEDURE:  Under local and MAC, an excision of a left ankle mass was performed.  We split the sheath over the peroneal tendons and she had a gray mass, which seemed to envelop a good portion of the peroneus longus for about a 3-cm stretch above the lateral malleolus.  This was excised and sent to pathology.  DESCRIPTION OF PROCEDURE:  The patient was taken to the operating suite where some sedation was applied.  She was positioned supine and prepped and draped in normal sterile fashion.  After administration of IV Kefzol, a small area was anesthetized with a mixture of lidocaine and Marcaine with a small amount of epinephrine.  We then elevated her leg and exsanguinated and inflated the tourniquet above the upper calf.  A longitudinal incision was made over this mass, which was above the lateral malleolus.  Dissection  was carried down to the peroneal tendon sheath.  This structure was incised longitudinally.  I then could examined the peroneus longus and peroneus brevis tendons.  She had a grayish mass about 3 x 1 x 1 cm, which seemed to enveloped a good portion of the peroneus longus and a lesser portion of the brevis at this level.  This was loosely adherent to the underlying structures. This was removed and sent to pathology.  The wound was irrigated followed by release of tourniquet.  A small amount of bleeding was easily controlled with some pressure.  We then reapproximated the peroneal tendon sheath in this area with a running suture of 2-0 Vicryl. Subcutaneous tissue was reapproximated with a similar suture followed by skin closure with nylon.  Adaptic was applied followed by dry gauze and a loose Ace wrap.  Estimated blood loss and intraoperative fluids as well as accurate tourniquet time can be obtained from anesthesia records.  DISPOSITION:  The patient was taken to the recovery room in stable addition.  She was to go home same-day and follow up in the office next week.  I will contact her by phone tonight.  Lubertha Basque Jerl Santos, [name]     PGD/MEDQ  D:  04/13/2011  T:  04/14/2011  Job:  [id number]  Electronically Signed by Marcene Corning [name] on 05/12/2011 08:42:45 PM

## 2011-05-12 NOTE — Telephone Encounter (Signed)
Refilled diazepam 5mg . To [facility]

## 2011-05-28 ENCOUNTER — Ambulatory Visit (INDEPENDENT_AMBULATORY_CARE_PROVIDER_SITE_OTHER): Payer: Medicare Other | Admitting: *Deleted

## 2011-05-28 DIAGNOSIS — I82409 Acute embolism and thrombosis of unspecified deep veins of unspecified lower extremity: Secondary | ICD-10-CM

## 2011-06-02 ENCOUNTER — Other Ambulatory Visit: Payer: Self-pay | Admitting: Cardiology

## 2011-06-02 DIAGNOSIS — I119 Hypertensive heart disease without heart failure: Secondary | ICD-10-CM

## 2011-06-02 DIAGNOSIS — E039 Hypothyroidism, unspecified: Secondary | ICD-10-CM

## 2011-06-02 DIAGNOSIS — I82409 Acute embolism and thrombosis of unspecified deep veins of unspecified lower extremity: Secondary | ICD-10-CM

## 2011-06-25 ENCOUNTER — Ambulatory Visit (INDEPENDENT_AMBULATORY_CARE_PROVIDER_SITE_OTHER): Payer: Medicare Other | Admitting: *Deleted

## 2011-06-25 DIAGNOSIS — I82409 Acute embolism and thrombosis of unspecified deep veins of unspecified lower extremity: Secondary | ICD-10-CM

## 2011-08-02 ENCOUNTER — Other Ambulatory Visit: Payer: Medicare Other | Admitting: *Deleted

## 2011-08-02 ENCOUNTER — Ambulatory Visit (INDEPENDENT_AMBULATORY_CARE_PROVIDER_SITE_OTHER): Payer: Medicare Other | Admitting: *Deleted

## 2011-08-02 ENCOUNTER — Encounter: Payer: Self-pay | Admitting: Cardiology

## 2011-08-02 ENCOUNTER — Ambulatory Visit (INDEPENDENT_AMBULATORY_CARE_PROVIDER_SITE_OTHER): Payer: Medicare Other | Admitting: Cardiology

## 2011-08-02 VITALS — BP 118/70 | HR 70 | Ht 67.0 in | Wt 155.0 lb

## 2011-08-02 DIAGNOSIS — I82409 Acute embolism and thrombosis of unspecified deep veins of unspecified lower extremity: Secondary | ICD-10-CM

## 2011-08-02 DIAGNOSIS — M1990 Unspecified osteoarthritis, unspecified site: Secondary | ICD-10-CM

## 2011-08-02 DIAGNOSIS — I119 Hypertensive heart disease without heart failure: Secondary | ICD-10-CM

## 2011-08-02 DIAGNOSIS — E7800 Pure hypercholesterolemia, unspecified: Secondary | ICD-10-CM

## 2011-08-02 DIAGNOSIS — E039 Hypothyroidism, unspecified: Secondary | ICD-10-CM

## 2011-08-02 DIAGNOSIS — H3530 Unspecified macular degeneration: Secondary | ICD-10-CM

## 2011-08-02 LAB — LIPID PANEL
Cholesterol: 212 mg/dL — ABNORMAL HIGH (ref 0–200)
VLDL: 38.2 mg/dL (ref 0.0–40.0)

## 2011-08-02 LAB — POCT INR: INR: 2

## 2011-08-02 LAB — T4, FREE: Free T4: 0.84 ng/dL (ref 0.60–1.60)

## 2011-08-02 LAB — BASIC METABOLIC PANEL
Calcium: 10.1 mg/dL (ref 8.4–10.5)
Chloride: 102 mEq/L (ref 96–112)
Creatinine, Ser: 0.9 mg/dL (ref 0.4–1.2)

## 2011-08-02 LAB — HEPATIC FUNCTION PANEL
Albumin: 3.8 g/dL (ref 3.5–5.2)
Alkaline Phosphatase: 62 U/L (ref 39–117)
Total Protein: 7.1 g/dL (ref 6.0–8.3)

## 2011-08-02 LAB — TSH: TSH: 3.18 u[IU]/mL (ref 0.35–5.50)

## 2011-08-02 NOTE — Patient Instructions (Signed)
Will obtain labs and call you with labs  Your physician recommends that you schedule a follow-up appointment in: 4 months

## 2011-08-02 NOTE — Assessment & Plan Note (Signed)
The patient is clinically euthyroid.  We are checking thyroid functions today.

## 2011-08-02 NOTE — Assessment & Plan Note (Signed)
The patient has a history of essential hypertension.  She is tolerating her present combination of medication which includes Ziac, diltiazem, and hydrochlorothiazide.  She denies any headaches or dizzy spells

## 2011-08-02 NOTE — Progress Notes (Signed)
Marissa Parsons Date of Birth:  1929/04/17 [facility] / [facility] [address]   [address] [place], [place]  [address] [phone]           Fax   [phone]  HPI: This pleasant 76 year old woman is seen for a scheduled four-month followup office visit.  She has a past history of recurrent deep vein thrombosis and is on long-term Coumadin.  She also has a history of essential hypertension.  She has a history of hypothyroidism.  She's had osteoarthritic problems and since we last saw her she had removal of a benign mass from her left ankle.  She has not been expressing any chest pain or shortness of breath.  She does not have any history of ischemic heart disease.  Current Outpatient Prescriptions  Medication Sig Dispense Refill  . acetaminophen (TYLENOL) 325 MG tablet Take 650 mg by mouth every 6 (six) hours as needed.        . bisoprolol-hydrochlorothiazide (ZIAC) 5-6.25 MG per tablet Take 1 tablet by mouth daily.        . diazepam (VALIUM) 5 MG tablet TAKE 1 TABLET BY MOUTH EVERY DAY AS NEEDED  60 tablet  3  . diltiazem (CARDIZEM CD) 240 MG 24 hr capsule Take 240 mg by mouth daily.        . hydrochlorothiazide 25 MG tablet Take 25 mg by mouth daily.        Marland Kitchen levothyroxine (SYNTHROID, LEVOTHROID) 50 MCG tablet Take 50 mcg by mouth daily. Take 3 tabs Mon and Wed. ;   1 tablet 4 all others      . predniSONE (DELTASONE) 5 MG tablet Take 5 mg by mouth daily.        Marland Kitchen warfarin (COUMADIN) 5 MG tablet Take 5 mg by mouth daily.          Allergies  Allergen Reactions  . Keflex   . Morphine And Related     nausea  . Penicillins   . Sinutab (Sine-Off)   . Skelaxin   . Tetanus Toxoids   . Zestril (Lisinopril)     Patient Active Problem List  Diagnoses  . Hypothyroidism  . Osteoporosis  . Macular degeneration  . Deep vein thrombosis (DVT)  . Benign hypertensive heart disease without heart failure  . Osteoarthritis    History  Smoking status  . Never Smoker     Smokeless tobacco  . Not on file    History  Alcohol Use No    Family History  Problem Relation Age of Onset  . Abnormal EKG Neg Hx   . Abnormal newborn screen Neg Hx   . Achalasia Neg Hx   . Achondroplasia Neg Hx   . Acne Neg Hx   . Acromegaly Neg Hx   . Actinic keratosis Neg Hx   . Acute lymphoblastic leukemia Neg Hx   . Addison's disease Neg Hx   . Adrenal disorder Neg Hx   . Albinism Neg Hx   . Alcohol abuse Neg Hx   . Alkaptonuria Neg Hx   . Allergic rhinitis Neg Hx   . Allergies Neg Hx   . Allergy (severe) Neg Hx   . Alopecia Neg Hx   . Alpha-1 antitrypsin deficiency Neg Hx   . Alport syndrome Neg Hx   . ALS Neg Hx   . Alzheimer's disease Neg Hx   . Ambiguous genitalia Neg Hx   . Amblyopia Neg Hx   . Amenorrhea Neg Hx   .  Anal fissures Neg Hx   . Anemia Neg Hx   . Anesthesia problems Neg Hx   . Aneurysm Neg Hx   . Angelman syndrome Neg Hx   . Angina Neg Hx   . Angioedema Neg Hx   . Ankylosing spondylitis Neg Hx   . Anorectal malformation Neg Hx   . Anorexia nervosa Neg Hx   . Anti-cardiolipin syndrome Neg Hx   . Antithrombin III deficiency Neg Hx   . Anuerysm Neg Hx   . Anxiety disorder Neg Hx   . Aortic aneurysm Neg Hx   . Aortic dissection Neg Hx   . Aortic stenosis Neg Hx   . Aphthous stomatitis Neg Hx   . Aplastic anemia Neg Hx   . Appendicitis Neg Hx   . Apraxia Neg Hx   . Arnold-Chiari malformation Neg Hx   . Arrhythmia Neg Hx   . Arthritis Neg Hx   . Asperger's syndrome Neg Hx   . Asthma Neg Hx   . Ataxia Neg Hx   . Ataxia telangiectasia Neg Hx   . Atopy Neg Hx   . Atrial fibrillation Neg Hx   . Atrophic kidney Neg Hx   . Auditory processing disorder Neg Hx   . Autism Neg Hx   . Autism spectrum disorder Neg Hx   . Autoimmune disease Neg Hx   . AVM Neg Hx   . Baker's cyst Neg Hx   . Barrett's esophagus Neg Hx   . Bartter's syndrome Neg Hx   . Basal cell carcinoma Neg Hx   . Behavior problems Neg Hx   . Bell's palsy Neg Hx   .  Benign prostatic hyperplasia Neg Hx   . Bipolar disorder Neg Hx   . Birth defects Neg Hx   . Birth marks Neg Hx   . Blindness Neg Hx   . Bone cancer Neg Hx   . BOR syndrome Neg Hx   . Bow legs Neg Hx   . Bradycardia Neg Hx   . Brain cancer Neg Hx   . BRCA 1/2 Neg Hx   . Breast cancer Neg Hx   . Broken bones Neg Hx   . Bronchiolitis Neg Hx   . Bronchopulmonary dysplasia Neg Hx   . Bruton's disease Neg Hx   . Bulemia Neg Hx   . Bullous pemphigoid Neg Hx   . Bunion Neg Hx   . Bursitis Neg Hx   . Caf-au-lait spots Neg Hx   . Calcium disorder Neg Hx   . Canavan disease Neg Hx   . Cancer Neg Hx   . Cardiomyopathy Neg Hx   . Carpal tunnel syndrome Neg Hx   . Cataracts Neg Hx   . Celiac disease Neg Hx   . Cerebral aneurysm Neg Hx   . Cerebral palsy Neg Hx   . Cervical cancer Neg Hx   . Cervical polyp Neg Hx   . Cervicitis Neg Hx   . Chalasia Neg Hx   . Chalazion Neg Hx   . Charcot-Marie-Tooth disease Neg Hx   . Chediak-Higashi syndrome Neg Hx   . Chiari malformation Neg Hx   . Childhood respiratory disease Neg Hx   . Choanal atresia Neg Hx   . Cholecystitis Neg Hx   . Cholelithiasis Neg Hx   . Cholesteatoma Neg Hx   . Chondromalacia Neg Hx   . Chorea Neg Hx   . Chromosomal disorder Neg Hx   . Chronic bronchitis Neg Hx   .  Chronic fatigue Neg Hx   . Chronic granulomatous disease Neg Hx   . Chronic infections Neg Hx   . Cirrhosis Neg Hx   . Cleft lip Neg Hx   . Cleft palate Neg Hx   . Clotting disorder Neg Hx   . Club foot Neg Hx   . Coarctation of the aorta Neg Hx   . Colitis Neg Hx   . Collagen disease Neg Hx   . Colon cancer Neg Hx   . Colon polyps Neg Hx   . Colonic polyp Neg Hx   . Color blindness Neg Hx   . Conduct disorder Neg Hx   . Conductive hearing loss Neg Hx   . Congenital adrenal hyperplasia Neg Hx   . Congenital heart disease Neg Hx   . Conjunctivitis Neg Hx   . Consanguinity Neg Hx   . Constipation Neg Hx   . COPD Neg Hx   . Corneal  abrasion Neg Hx   . Corneal ulcer Neg Hx   . Coronary aneurysm Neg Hx   . Coronary artery disease Neg Hx   . Cowden syndrome Neg Hx   . Craniosynostosis Neg Hx   . Cri-du-chat syndrome Neg Hx   . Crohn's disease Neg Hx   . Cushing syndrome Neg Hx   . Cystic fibrosis Neg Hx   . Cystic kidney disease Neg Hx   . Cystinosis Neg Hx   . Decreased libido Neg Hx   . Deep vein thrombosis Neg Hx   . Delayed menopause Neg Hx   . Delayed puberty Neg Hx   . Dementia Neg Hx   . Dental caries Neg Hx   . Depression Neg Hx   . Dermatomyositis Neg Hx   . DES usage Neg Hx   . Developmental delay Neg Hx   . Diabetes Neg Hx   . Diabetes insipidus Neg Hx   . Diabetes type I Neg Hx   . Diabetes type II Neg Hx   . Diabetic kidney disease Neg Hx   . DiGeorge syndrome Neg Hx   . Dilated cardiomyopathy Neg Hx   . Dislocations Neg Hx   . Diverticulitis Neg Hx   . Diverticulosis Neg Hx   . Down syndrome Neg Hx   . Drug abuse Neg Hx   . Dupuytren's contracture Neg Hx   . Dwarfism Neg Hx   . Dysfunctional uterine bleeding Neg Hx   . Dysmenorrhea Neg Hx   . Dyspareunia Neg Hx   . Dysphagia Neg Hx   . Dysrhythmia Neg Hx   . Dystonia Neg Hx   . Early death Neg Hx   . Early menopause Neg Hx   . Early puberty Neg Hx   . Eating disorder Neg Hx   . Eclampsia Neg Hx   . Ectodermal dysplasia Neg Hx   . Eczema Neg Hx   . Edema Neg Hx   . Edward's syndrome Neg Hx   . Ehlers-Danlos syndrome Neg Hx   . Emotional abuse Neg Hx   . Emphysema Neg Hx   . Encopresis Neg Hx   . Endocrine tumor Neg Hx   . Endocrinopathy Neg Hx   . Endometrial cancer Neg Hx   . Endometriosis Neg Hx   . Enuresis Neg Hx   . Eosinophilic granuloma Neg Hx   . Epididymitis Neg Hx   . Erectile dysfunction Neg Hx   . Erythema nodosum Neg Hx   . Esophageal cancer Neg Hx   .  Esophageal varices Neg Hx   . Esophagitis Neg Hx   . Exostosis Neg Hx   . Fabry's disease Neg Hx   . Factor IX deficiency Neg Hx   . Factor V Leiden  deficiency Neg Hx   . Factor VIII deficiency Neg Hx   . Failure to thrive Neg Hx   . Fainting Neg Hx   . Familial dysautonomia Neg Hx   . Familial nephritis Neg Hx   . Familial polyposis Neg Hx   . Fanconi anemia Neg Hx   . Febrile seizures Neg Hx   . Fibrocystic breast disease Neg Hx   . Fibroids Neg Hx   . Fibromyalgia Neg Hx   . Food intolerance Neg Hx   . Fragile X syndrome Neg Hx   . Friedreich's ataxia Neg Hx   . Frontotemporal dementia Neg Hx   . Fuch's dystrophy Neg Hx   . Gait disorder Neg Hx   . Galactosemia Neg Hx   . Gallbladder disease Neg Hx   . Gaucher's disease Neg Hx   . Genodermatoses Neg Hx   . GER disease Neg Hx   . Gestational diabetes Neg Hx   . GI problems Neg Hx   . Glaucoma Neg Hx   . Glomerulonephritis Neg Hx   . Glucose-6-phos deficiency Neg Hx   . Glycogen storage disease Neg Hx   . Goiter Neg Hx   . Gonadal disorder Neg Hx   . Gout Neg Hx   . Graves' disease Neg Hx   . Growth hormone deficiency Neg Hx   . GU problems Neg Hx   . Hammer toes Neg Hx   . Hartnup's disease Neg Hx   . Hashimoto's thyroiditis Neg Hx   . Healthy Neg Hx   . Hearing loss Neg Hx   . Heart block Neg Hx   . Heart defect Neg Hx   . Heart disease Neg Hx   . Heart failure Neg Hx   . Heart murmur Neg Hx   . Hemangiomas Neg Hx   . Hematuria Neg Hx   . Hemochromatosis Neg Hx   . Hemolytic uremic syndrome Neg Hx   . Hemophilia Neg Hx   . Henoch-Schonlein purpura Neg Hx   . Hepatitis Neg Hx   . Hepatomegaly Neg Hx   . Hereditary spherocytosis Neg Hx   . Hernia Neg Hx   . Hiatal hernia Neg Hx   . High arches Neg Hx   . Hip dysplasia Neg Hx   . Hip fracture Neg Hx   . Hirschsprung's disease Neg Hx   . Hirsutism Neg Hx   . Histiocytosis X Neg Hx   . HIV Neg Hx   . HLA-B27 positive Neg Hx   . Hodgkin's lymphoma Neg Hx   . Homocystinuria Neg Hx   . Hunter's disease Neg Hx   . Huntington's disease Neg Hx   . Hydrocele Neg Hx   . Hydrocephalus Neg Hx   . Hygroma  Neg Hx   . Hypercalcemia Neg Hx   . Hypereosinophilic syndrome Neg Hx   . Hyperinsulinemia Neg Hx   . Hyperkalemia Neg Hx   . Hyperlipidemia Neg Hx   . Hypermobility Neg Hx   . Hypernasality Neg Hx   . Hyperopia Neg Hx   . Hyperparathyroidism Neg Hx   . Hypersomnolence Neg Hx   . Hypertension Neg Hx   . Hyperthyroidism Neg Hx   . Hypertrophic cardiomyopathy Neg Hx   . Hypokalemia  Neg Hx   . Hypoparathyroidism Neg Hx   . Hypoplastic kidney Neg Hx   . Hypotension Neg Hx   . Hypothyroidism Neg Hx   . Idiopathic pulmonary fibrosis Neg Hx   . Idiopathic torsion dystonia Neg Hx   . IgA nephropathy Neg Hx   . Immunodeficiency Neg Hx   . Imperforate anus Neg Hx   . Impotence Neg Hx   . Impulse control disorder Neg Hx   . Incompetent cervix Neg Hx   . Infertility Neg Hx   . Inflammatory bowel disease Neg Hx   . Inguinal hernia Neg Hx   . Insomnia Neg Hx   . Insulin resistance Neg Hx   . Interstitial cystitis Neg Hx   . Intestinal malrotation Neg Hx   . Intestinal polyp Neg Hx   . Intracerebral hemorrhage Neg Hx   . Iron deficiency Neg Hx   . Irregular heart beat Neg Hx   . Irritable bowel syndrome Neg Hx   . Jaundice Neg Hx   . Job's syndrome Neg Hx   . Joint hypermobility Neg Hx   . Juvenile idiopathic arthritis Neg Hx   . Kartagener's syndrome Neg Hx   . Kawasaki disease Neg Hx   . Keloids Neg Hx   . Kennedy's disease Neg Hx   . Keratoconus Neg Hx   . Kidney cancer Neg Hx   . Kidney disease Neg Hx   . Kidney failure Neg Hx   . Kidney nephrosis Neg Hx   . Klinefelter's syndrome Neg Hx   . Krabbe disease Neg Hx   . Kyphosis Neg Hx   . Labyrinthitis Neg Hx   . Lactose intolerance Neg Hx   . Language disorder Neg Hx   . Lead poisoning Neg Hx   . Learning disabilities Neg Hx   . Legg-Calve-Perthes disease Neg Hx   . Lesch-Nyhan syndrome Neg Hx   . Leukemia Neg Hx   . Lichen planus Neg Hx   . Li-Fraumeni syndrome Neg Hx   . Liver cancer Neg Hx   . Liver disease  Neg Hx   . Long QT syndrome Neg Hx   . Lumbar disc disease Neg Hx   . Lung cancer Neg Hx   . Lung disease Neg Hx   . Lupus Neg Hx   . Lymphoma Neg Hx   . Macrocephaly Neg Hx   . Macrosomia Neg Hx   . Macular degeneration Neg Hx   . Malignant hypertension Neg Hx   . Malignant hyperthermia Neg Hx   . Maple syrup urine disease Neg Hx   . Marfan syndrome Neg Hx   . Mastocytosis Neg Hx   . Melanoma Neg Hx   . Memory loss Neg Hx   . Meniere's disease Neg Hx   . Menstrual problems Neg Hx   . Mental illness Neg Hx   . Mental retardation Neg Hx   . Metabolic syndrome Neg Hx   . Microcephaly Neg Hx   . Migraines Neg Hx   . Milk intolerance Neg Hx   . Miscarriages / Stillbirths Neg Hx   . Mitochondrial disorder Neg Hx   . Mitral valve prolapse Neg Hx   . Motor neuron disease Neg Hx   . Movement disorder Neg Hx   . Moyamoya disease Neg Hx   . Multiple births Neg Hx   . Multiple endocrine neoplasia Neg Hx   . Multiple fractures Neg Hx   . Multiple myeloma Neg Hx   .  Multiple sclerosis Neg Hx   . Muscle cancer Neg Hx   . Muscular dystrophy Neg Hx   . Myasthenia gravis Neg Hx   . Myelodysplastic syndrome Neg Hx   . Myocarditis Neg Hx   . Myoclonus Neg Hx   . Myopathy Neg Hx   . Nail disease Neg Hx   . Narcolepsy Neg Hx   . Nephritis Neg Hx   . Nephrolithiasis Neg Hx   . Nephrotic syndrome Neg Hx   . Neural tube defect Neg Hx   . Neurodegenerative disease Neg Hx   . Neurofibromatosis Neg Hx   . Neuromuscular disorder Neg Hx   . Neuropathy Neg Hx   . Neutropenia Neg Hx   . Nevi Neg Hx   . Niemann-Pick disease Neg Hx   . Night blindness Neg Hx   . Nocturnal enuresis Neg Hx   . Nystagmus Neg Hx   . Obesity Neg Hx   . OCD Neg Hx   . ODD Neg Hx   . Orchitis Neg Hx   . Osler-Weber-Rendu syndrome Neg Hx   . Osteoarthritis Neg Hx   . Osteochondroma Neg Hx   . Osteogenesis imperfecta Neg Hx   . Osteopenia Neg Hx   . Osteoporosis Neg Hx   . Osteosarcoma Neg Hx   .  Osteosclerosis Neg Hx   . Other Neg Hx   . Otitis media Neg Hx   . Ovarian cancer Neg Hx   . Ovarian cysts Neg Hx   . Oxalosis Neg Hx   . Paget's disease of bone Neg Hx   . Pancreatic cancer Neg Hx   . Pancreatitis Neg Hx   . Panhypopituitarism Neg Hx   . Panic disorder Neg Hx   . Paranoid behavior Neg Hx   . Parasomnia Neg Hx   . Parkinsonism Neg Hx   . Patau's syndrome Neg Hx   . Pathological gambling Neg Hx   . PDD Neg Hx   . Pectus carinatum Neg Hx   . Pectus excavatum Neg Hx   . Pelvic inflammatory disease Neg Hx   . Pemphigus vulgaris Neg Hx   . Penile cancer Neg Hx   . Periodic limb movement Neg Hx   . Peripheral vascular disease Neg Hx   . Pernicious anemia Neg Hx   . Personality disorder Neg Hx   . Pes cavus Neg Hx   . Physical abuse Neg Hx   . Otilio Jefferson sequence Neg Hx   . PKU Neg Hx   . Plantar fasciitis Neg Hx   . Pleurisy Neg Hx   . Pneumonia Neg Hx   . Polychondritis Neg Hx   . Polycystic kidney disease Neg Hx   . Polycystic ovary syndrome Neg Hx   . Polycythemia Neg Hx   . Polymyalgia rheumatica Neg Hx   . Polymyositis Neg Hx   . Pompe disease Neg Hx   . Positive PPD/TB Exposure Neg Hx   . Posterior urethral valves Neg Hx   . Post-traumatic stress disorder Neg Hx   . Prader-Willi syndrome Neg Hx   . Premature birth Neg Hx   . Premature ovarian failure Neg Hx   . Preterm labor Neg Hx   . Prolactinoma Neg Hx   . Prostate cancer Neg Hx   . Prostatitis Neg Hx   . Protein C deficiency Neg Hx   . Protein S deficiency Neg Hx   . Proteinuria Neg Hx   . Prune belly syndrome Neg Hx   .  Pruritis Neg Hx   . Pseudochol deficiency Neg Hx   . Pseudotumor cerebri Neg Hx   . Psoriasis Neg Hx   . Psychosis Neg Hx   . Ptosis Neg Hx   . Pulmonary embolism Neg Hx   . Pulmonary fibrosis Neg Hx   . Pyelonephritis Neg Hx   . Pyloric stenosis Neg Hx   . Pyruvate dehydrogenase deficiency Neg Hx   . Rashes / Skin problems Neg Hx   . Raynaud syndrome Neg Hx     . Rectal cancer Neg Hx   . Recurrent abdominal pain Neg Hx   . Reiter's syndrome Neg Hx   . Renal tubular acidosis Neg Hx   . Restless legs syndrome Neg Hx   . Retinal degeneration Neg Hx   . Retinal detachment Neg Hx   . Retinitis pigmentosa Neg Hx   . Retinoblastoma Neg Hx   . Retinopathy of prematurity Neg Hx   . Reye's syndrome Neg Hx   . Rheum arthritis Neg Hx   . Rheumatic fever Neg Hx   . Rheumatologic disease Neg Hx   . Rickets Neg Hx   . Rosacea Neg Hx   . Sacroiliitis Neg Hx   . Sarcoidosis Neg Hx   . Schizophrenia Neg Hx   . Scleritis Neg Hx   . Scleroderma Neg Hx   . Scoliosis Neg Hx   . Seasonal affective disorder Neg Hx   . Seizures Neg Hx   . Selective mutism Neg Hx   . Sensorineural hearing loss Neg Hx   . Severe combined immunodeficiency Neg Hx   . Severe sprains Neg Hx   . Sexual abuse Neg Hx   . Short stature Neg Hx   . Sick sinus syndrome Neg Hx   . Sickle cell anemia Neg Hx   . Sickle cell trait Neg Hx   . SIDS Neg Hx   . Single kidney Neg Hx   . Sinusitis Neg Hx   . Sjogren's syndrome Neg Hx   . Skeletal dysplasia Neg Hx   . Skin cancer Neg Hx   . Skin telangiectasia Neg Hx   . Sleep apnea Neg Hx   . Sleep disorder Neg Hx   . Sleep walking Neg Hx   . Snoring Neg Hx   . Social phobia Neg Hx   . Speech disorder Neg Hx   . Spina bifida Neg Hx   . Spinal muscular atrophy Neg Hx   . Splenomegaly Neg Hx   . Spondyloarthropathy Neg Hx   . Spondylolisthesis Neg Hx   . Spondylolysis Neg Hx   . Squamous cell carcinoma Neg Hx   . Stevens-Johnson syndrome Neg Hx   . Stickler syndrome Neg Hx   . Stomach cancer Neg Hx   . Strabismus Neg Hx   . Stroke Neg Hx   . Stuttering Neg Hx   . Subarachnoid hemorrhage Neg Hx   . Sudden death Neg Hx   . Suicidality Neg Hx   . Supraventricular tachycardia Neg Hx   . Swallowing difficulties Neg Hx   . Tall stature Neg Hx   . Tay-Sachs disease Neg Hx   . Testicular cancer Neg Hx   . Thalassemia Neg Hx   .  Throat cancer Neg Hx   . Thrombocytopenia Neg Hx   . Thrombophilia Neg Hx   . Thrombophlebitis Neg Hx   . Thrombosis Neg Hx   . Thymic aplasia Neg Hx   . Thyroid cancer Neg Hx   .  Thyroid disease Neg Hx   . Thyroid nodules Neg Hx   . Tics Neg Hx   . Tongue cancer Neg Hx   . Torticollis Neg Hx   . Tourette syndrome Neg Hx   . Tracheal cancer Neg Hx   . Tracheoesophageal fistual Neg Hx   . Tracheomalacia Neg Hx   . Transient ischemic attack Neg Hx   . Treacher Collins syndrome Neg Hx   . Tremor Neg Hx   . Tuberculosis Neg Hx   . Tuberous sclerosis Neg Hx   . Turner syndrome Neg Hx   . Ulcerative colitis Neg Hx   . Ulcers Neg Hx   . Undescended testes Neg Hx   . Unexplained death Neg Hx   . Urinary tract infection Neg Hx   . Urolithiasis Neg Hx   . Urticaria Neg Hx   . Uterine cancer Neg Hx   . Uveitis Neg Hx   . Vaginal cancer Neg Hx   . Valvular heart disease Neg Hx   . Vasculitis Neg Hx   . Velocardiofacial syndrome Neg Hx   . Venous thrombosis Neg Hx   . Verruca vulgaris  Neg Hx   . Vesicoureteral reflux Neg Hx   . Vision loss Neg Hx   . Vitamin D deficiency Neg Hx   . Vitiligo Neg Hx   . Voice disorder Neg Hx   . Von Gierke's disease Neg Hx   . Von Hippel-Lindau syndrome Neg Hx   . Von Willebrand disease Neg Hx   . Werdnig Hoffman Disease Neg Hx   . Williams syndrome Neg Hx   . Wilm's tumor Neg Hx   . Wilson's disease Neg Hx   . Wiskott-Aldrich syndrome Neg Hx   . Evelene Croon Parkinson White syndrome Neg Hx   . Xeroderma pigmentosa Neg Hx   . Heart attack Mother   . Heart attack Father     Review of Systems: The patient denies any heat or cold intolerance.  No weight gain or weight loss.  The patient denies headaches or blurry vision.  There is no cough or sputum production.  The patient denies dizziness.  There is no hematuria or hematochezia.  The patient denies any muscle aches or arthritis.  The patient denies any rash.  The patient denies frequent falling or  instability.  There is no history of depression or anxiety.  All other systems were reviewed and are negative.   Physical Exam: Filed Vitals:   08/02/11 1038  BP: 118/70  Pulse: 70   the general appearance reveals an elderly woman who appears to of loss of weight.  She is in no acute distress thePupils equal and reactive.   Extraocular Movements are full.  There is no scleral icterus.  The mouth and pharynx are normal.  The neck is supple.  The carotids reveal no bruits.  The jugular venous pressure is normal.  The thyroid is not enlarged.  There is no lymphadenopathy.  The vision is significantly impaired from bilateral macular degeneration. The chest is clear to percussion and auscultation. There are no rales or rhonchi. Expansion of the chest is symmetrical.  The precordium is quiet.  The first heart sound is normal.  The second heart sound is physiologically split.  There is no murmur gallop rub or click.  There is no abnormal lift or heave.  The abdomen is soft and nontender. Bowel sounds are normal. The liver and spleen are not enlarged. There Are no abdominal masses. There are  no bruits.  The pedal pulses are good.  There is no phlebitis or edema.  There is no cyanosis or clubbing. Strength is normal and symmetrical in all extremities.  There is no lateralizing weakness.  There are no sensory deficits.  There is no evidence of any active phlebitis at this time.      Assessment / Plan: Continue same medication.  We are checking a full lab work today.  Recheck in 4 months for office visit and EKG

## 2011-08-02 NOTE — Assessment & Plan Note (Signed)
Patient is legally blind from macular degeneration.  He is still able to live by herself.  She no longer drives.

## 2011-08-04 ENCOUNTER — Telehealth: Payer: Self-pay | Admitting: *Deleted

## 2011-08-04 DIAGNOSIS — I119 Hypertensive heart disease without heart failure: Secondary | ICD-10-CM

## 2011-08-04 MED ORDER — HYDROCHLOROTHIAZIDE 12.5 MG PO TABS: 12.5000 mg | ORAL_TABLET | Freq: Every day | ORAL | Status: DC

## 2011-08-04 NOTE — Telephone Encounter (Signed)
Advised of labs New Rx sent in

## 2011-08-04 NOTE — Telephone Encounter (Signed)
Message copied by Burnell Blanks on Wed Aug 04, 2011  5:52 PM ------      Message from: [name]      Created: Mon Aug 02, 2011  8:31 PM       The K is too low.  I want her to decrease HCTZ to just 12.5 daily.  Her eyes are bad.  She will probably need new Rx for the smaller pills.            Thyroid is stable.  Lipids are slightly high.  Watch diet carefully. Increase high K foods

## 2011-08-06 ENCOUNTER — Telehealth: Payer: Self-pay | Admitting: Cardiology

## 2011-08-06 NOTE — Telephone Encounter (Signed)
Spoke with patient and verified HCTZ dose

## 2011-08-06 NOTE — Telephone Encounter (Signed)
Pt was told to call you today?

## 2011-08-06 NOTE — Telephone Encounter (Signed)
Pt calling wanting to clarify what pills pt should be taking. Please return call to discuss further.

## 2011-08-25 ENCOUNTER — Other Ambulatory Visit: Payer: Self-pay | Admitting: Cardiology

## 2011-08-30 ENCOUNTER — Ambulatory Visit (INDEPENDENT_AMBULATORY_CARE_PROVIDER_SITE_OTHER): Payer: Medicare Other | Admitting: *Deleted

## 2011-08-30 DIAGNOSIS — I82409 Acute embolism and thrombosis of unspecified deep veins of unspecified lower extremity: Secondary | ICD-10-CM

## 2011-09-06 ENCOUNTER — Telehealth: Payer: Self-pay | Admitting: Cardiology

## 2011-09-06 DIAGNOSIS — I119 Hypertensive heart disease without heart failure: Secondary | ICD-10-CM

## 2011-09-06 NOTE — Telephone Encounter (Signed)
We should have her come in for a basal metabolic panel to see where we stand with her potassium now.

## 2011-09-06 NOTE — Telephone Encounter (Signed)
Advised patient

## 2011-09-06 NOTE — Telephone Encounter (Signed)
New msg Pt wants to talk to you about hctz  She wants to know about dosage please call

## 2011-09-06 NOTE — Telephone Encounter (Signed)
Potassium was a little low at last labs and HCTZ was decreased to 12.5 mg and she increased potassium intake.  She now has a lot of burning/pain in her legs, worse at night.  Please advise, no follow up labs scheduled

## 2011-09-07 ENCOUNTER — Other Ambulatory Visit (INDEPENDENT_AMBULATORY_CARE_PROVIDER_SITE_OTHER): Payer: Medicare Other | Admitting: *Deleted

## 2011-09-07 DIAGNOSIS — I119 Hypertensive heart disease without heart failure: Secondary | ICD-10-CM

## 2011-09-08 LAB — BASIC METABOLIC PANEL
BUN: 15 mg/dL (ref 6–23)
Calcium: 10.1 mg/dL (ref 8.4–10.5)
Creatinine, Ser: 0.9 mg/dL (ref 0.4–1.2)
GFR: 61.56 mL/min (ref >60.00)
Glucose, Bld: 110 mg/dL — ABNORMAL HIGH (ref 70–99)
Sodium: 143 mEq/L (ref 135–145)

## 2011-09-10 ENCOUNTER — Telehealth: Payer: Self-pay | Admitting: *Deleted

## 2011-09-10 NOTE — Telephone Encounter (Signed)
Notified of lab results. 

## 2011-09-10 NOTE — Telephone Encounter (Signed)
Message copied by Lorayne Bender on Fri Sep 10, 2011  4:44 PM ------      Message from: [name]      Created: Wed Sep 08, 2011  5:41 PM       Please report.  The labs are stable.  Continue same meds.  Continue careful diet.

## 2011-09-25 ENCOUNTER — Other Ambulatory Visit: Payer: Self-pay | Admitting: Cardiology

## 2011-09-27 ENCOUNTER — Encounter: Payer: Medicare Other | Admitting: *Deleted

## 2011-09-27 ENCOUNTER — Other Ambulatory Visit: Payer: Self-pay | Admitting: Dermatology

## 2011-10-04 ENCOUNTER — Ambulatory Visit (INDEPENDENT_AMBULATORY_CARE_PROVIDER_SITE_OTHER): Payer: Medicare Other | Admitting: *Deleted

## 2011-10-04 DIAGNOSIS — I82409 Acute embolism and thrombosis of unspecified deep veins of unspecified lower extremity: Secondary | ICD-10-CM

## 2011-11-01 ENCOUNTER — Ambulatory Visit (INDEPENDENT_AMBULATORY_CARE_PROVIDER_SITE_OTHER): Payer: Medicare Other | Admitting: *Deleted

## 2011-11-01 DIAGNOSIS — I82409 Acute embolism and thrombosis of unspecified deep veins of unspecified lower extremity: Secondary | ICD-10-CM

## 2011-11-06 ENCOUNTER — Other Ambulatory Visit: Payer: Self-pay | Admitting: Cardiology

## 2011-11-06 DIAGNOSIS — F419 Anxiety disorder, unspecified: Secondary | ICD-10-CM

## 2011-11-08 NOTE — Telephone Encounter (Signed)
Refill times one on diazepam

## 2011-11-09 ENCOUNTER — Other Ambulatory Visit: Payer: Self-pay | Admitting: *Deleted

## 2011-11-09 MED ORDER — LEVOTHYROXINE SODIUM 50 MCG PO TABS: ORAL_TABLET | ORAL | Status: DC

## 2011-11-12 ENCOUNTER — Other Ambulatory Visit: Payer: Self-pay | Admitting: Cardiology

## 2011-11-12 MED ORDER — LEVOTHYROXINE SODIUM 50 MCG PO TABS: ORAL_TABLET | ORAL | Status: DC

## 2011-11-12 NOTE — Telephone Encounter (Signed)
Marissa Parsons refill sent on 11/09/11 call us first of week if med. Does not come in mail (synthroid)

## 2011-11-12 NOTE — Telephone Encounter (Signed)
Refill F/U  Patient Neighbor and MPOA   Lorane Gell [phone]   Calling to f/u on refill request, verified Pharmacy correct

## 2011-12-03 ENCOUNTER — Ambulatory Visit (INDEPENDENT_AMBULATORY_CARE_PROVIDER_SITE_OTHER): Payer: Medicare Other | Admitting: Cardiology

## 2011-12-03 ENCOUNTER — Encounter: Payer: Self-pay | Admitting: Cardiology

## 2011-12-03 ENCOUNTER — Ambulatory Visit (INDEPENDENT_AMBULATORY_CARE_PROVIDER_SITE_OTHER): Payer: Medicare Other

## 2011-12-03 DIAGNOSIS — I82409 Acute embolism and thrombosis of unspecified deep veins of unspecified lower extremity: Secondary | ICD-10-CM

## 2011-12-03 DIAGNOSIS — E039 Hypothyroidism, unspecified: Secondary | ICD-10-CM

## 2011-12-03 DIAGNOSIS — I119 Hypertensive heart disease without heart failure: Secondary | ICD-10-CM

## 2011-12-03 NOTE — Progress Notes (Signed)
Marissa Parsons Date of Birth:  Nov 07, 1928 [facility] [address] [place], [place]  [address] [phone]  Fax   [phone]  HPI: This pleasant 78 year old widowed Caucasian female is seen for a four-month followup office visit.  She has a past history of recurrent deep vein thrombosis and is on long-term Coumadin.  She has a past history of essential hypertension.  Since last visit she's generally been doing well.  She has not been expressing any chest pain or shortness of breath.  No dizziness syncope or headaches.  Appetite has been fair. Current Outpatient Prescriptions  Medication Sig Dispense Refill  . acetaminophen (TYLENOL) 325 MG tablet Take 650 mg by mouth every 6 (six) hours as needed.        . bisoprolol-hydrochlorothiazide (ZIAC) 5-6.25 MG per tablet Take 1 tablet by mouth daily.        . diazepam (VALIUM) 5 MG tablet TAKE 1 TABLET BY MOUTH EVERY DAY AS NEEDED  60 tablet  0  . diltiazem (CARDIZEM CD) 240 MG 24 hr capsule Take 240 mg by mouth daily.        . hydrochlorothiazide (HYDRODIURIL) 12.5 MG tablet Take 1 tablet (12.5 mg total) by mouth daily.  90 tablet  3  . levothyroxine (SYNTHROID, LEVOTHROID) 50 MCG tablet Take 2 tablets twice weekly and 1 tablet 5 days a week  105 tablet  2  . predniSONE (DELTASONE) 5 MG tablet TAKE 1 TABLET DAILY  90 tablet  2  . warfarin (COUMADIN) 5 MG tablet Take 5 mg by mouth daily.          Allergies  Allergen Reactions  . Keflex   . Morphine And Related     nausea  . Penicillins   . Sinutab (Sine-Off)   . Skelaxin   . Tetanus Toxoids   . Zestril (Lisinopril)     Patient Active Problem List  Diagnoses  . Hypothyroidism  . Osteoporosis  . Macular degeneration  . Deep vein thrombosis (DVT)  . Benign hypertensive heart disease without heart failure  . Osteoarthritis    History  Smoking status  . Never Smoker   Smokeless tobacco  . Not on file    History  Alcohol Use No    Family History    Problem Relation Age of Onset  . Abnormal EKG Neg Hx   . Abnormal newborn screen Neg Hx   . Achalasia Neg Hx   . Achondroplasia Neg Hx   . Acne Neg Hx   . Acromegaly Neg Hx   . Actinic keratosis Neg Hx   . Acute lymphoblastic leukemia Neg Hx   . Addison's disease Neg Hx   . Adrenal disorder Neg Hx   . Albinism Neg Hx   . Alcohol abuse Neg Hx   . Alkaptonuria Neg Hx   . Allergic rhinitis Neg Hx   . Allergies Neg Hx   . Allergy (severe) Neg Hx   . Alopecia Neg Hx   . Alpha-1 antitrypsin deficiency Neg Hx   . Alport syndrome Neg Hx   . ALS Neg Hx   . Alzheimer's disease Neg Hx   . Ambiguous genitalia Neg Hx   . Amblyopia Neg Hx   . Amenorrhea Neg Hx   . Anal fissures Neg Hx   . Anemia Neg Hx   . Anesthesia problems Neg Hx   . Aneurysm Neg Hx   . Angelman syndrome Neg Hx   . Angina Neg Hx   . Angioedema  Neg Hx   . Ankylosing spondylitis Neg Hx   . Anorectal malformation Neg Hx   . Anorexia nervosa Neg Hx   . Anti-cardiolipin syndrome Neg Hx   . Antithrombin III deficiency Neg Hx   . Anuerysm Neg Hx   . Anxiety disorder Neg Hx   . Aortic aneurysm Neg Hx   . Aortic dissection Neg Hx   . Aortic stenosis Neg Hx   . Aphthous stomatitis Neg Hx   . Aplastic anemia Neg Hx   . Appendicitis Neg Hx   . Apraxia Neg Hx   . Arnold-Chiari malformation Neg Hx   . Arrhythmia Neg Hx   . Arthritis Neg Hx   . Asperger's syndrome Neg Hx   . Asthma Neg Hx   . Ataxia Neg Hx   . Ataxia telangiectasia Neg Hx   . Atopy Neg Hx   . Atrial fibrillation Neg Hx   . Atrophic kidney Neg Hx   . Auditory processing disorder Neg Hx   . Autism Neg Hx   . Autism spectrum disorder Neg Hx   . Autoimmune disease Neg Hx   . AVM Neg Hx   . Baker's cyst Neg Hx   . Barrett's esophagus Neg Hx   . Bartter's syndrome Neg Hx   . Basal cell carcinoma Neg Hx   . Behavior problems Neg Hx   . Bell's palsy Neg Hx   . Benign prostatic hyperplasia Neg Hx   . Bipolar disorder Neg Hx   . Birth defects Neg  Hx   . Birth marks Neg Hx   . Blindness Neg Hx   . Bone cancer Neg Hx   . BOR syndrome Neg Hx   . Bow legs Neg Hx   . Bradycardia Neg Hx   . Brain cancer Neg Hx   . BRCA 1/2 Neg Hx   . Breast cancer Neg Hx   . Broken bones Neg Hx   . Bronchiolitis Neg Hx   . Bronchopulmonary dysplasia Neg Hx   . Bruton's disease Neg Hx   . Bulemia Neg Hx   . Bullous pemphigoid Neg Hx   . Bunion Neg Hx   . Bursitis Neg Hx   . Caf-au-lait spots Neg Hx   . Calcium disorder Neg Hx   . Canavan disease Neg Hx   . Cancer Neg Hx   . Cardiomyopathy Neg Hx   . Carpal tunnel syndrome Neg Hx   . Cataracts Neg Hx   . Celiac disease Neg Hx   . Cerebral aneurysm Neg Hx   . Cerebral palsy Neg Hx   . Cervical cancer Neg Hx   . Cervical polyp Neg Hx   . Cervicitis Neg Hx   . Chalasia Neg Hx   . Chalazion Neg Hx   . Charcot-Marie-Tooth disease Neg Hx   . Chediak-Higashi syndrome Neg Hx   . Chiari malformation Neg Hx   . Childhood respiratory disease Neg Hx   . Choanal atresia Neg Hx   . Cholecystitis Neg Hx   . Cholelithiasis Neg Hx   . Cholesteatoma Neg Hx   . Chondromalacia Neg Hx   . Chorea Neg Hx   . Chromosomal disorder Neg Hx   . Chronic bronchitis Neg Hx   . Chronic fatigue Neg Hx   . Chronic granulomatous disease Neg Hx   . Chronic infections Neg Hx   . Cirrhosis Neg Hx   . Cleft lip Neg Hx   . Cleft palate Neg Hx   .  Clotting disorder Neg Hx   . Club foot Neg Hx   . Coarctation of the aorta Neg Hx   . Colitis Neg Hx   . Collagen disease Neg Hx   . Colon cancer Neg Hx   . Colon polyps Neg Hx   . Colonic polyp Neg Hx   . Color blindness Neg Hx   . Conduct disorder Neg Hx   . Conductive hearing loss Neg Hx   . Congenital adrenal hyperplasia Neg Hx   . Congenital heart disease Neg Hx   . Conjunctivitis Neg Hx   . Consanguinity Neg Hx   . Constipation Neg Hx   . COPD Neg Hx   . Corneal abrasion Neg Hx   . Corneal ulcer Neg Hx   . Coronary aneurysm Neg Hx   . Coronary artery  disease Neg Hx   . Cowden syndrome Neg Hx   . Craniosynostosis Neg Hx   . Cri-du-chat syndrome Neg Hx   . Crohn's disease Neg Hx   . Cushing syndrome Neg Hx   . Cystic fibrosis Neg Hx   . Cystic kidney disease Neg Hx   . Cystinosis Neg Hx   . Decreased libido Neg Hx   . Deep vein thrombosis Neg Hx   . Delayed menopause Neg Hx   . Delayed puberty Neg Hx   . Dementia Neg Hx   . Dental caries Neg Hx   . Depression Neg Hx   . Dermatomyositis Neg Hx   . DES usage Neg Hx   . Developmental delay Neg Hx   . Diabetes Neg Hx   . Diabetes insipidus Neg Hx   . Diabetes type I Neg Hx   . Diabetes type II Neg Hx   . Diabetic kidney disease Neg Hx   . DiGeorge syndrome Neg Hx   . Dilated cardiomyopathy Neg Hx   . Dislocations Neg Hx   . Diverticulitis Neg Hx   . Diverticulosis Neg Hx   . Down syndrome Neg Hx   . Drug abuse Neg Hx   . Dupuytren's contracture Neg Hx   . Dwarfism Neg Hx   . Dysfunctional uterine bleeding Neg Hx   . Dysmenorrhea Neg Hx   . Dyspareunia Neg Hx   . Dysphagia Neg Hx   . Dysrhythmia Neg Hx   . Dystonia Neg Hx   . Early death Neg Hx   . Early menopause Neg Hx   . Early puberty Neg Hx   . Eating disorder Neg Hx   . Eclampsia Neg Hx   . Ectodermal dysplasia Neg Hx   . Eczema Neg Hx   . Edema Neg Hx   . Edward's syndrome Neg Hx   . Ehlers-Danlos syndrome Neg Hx   . Emotional abuse Neg Hx   . Emphysema Neg Hx   . Encopresis Neg Hx   . Endocrine tumor Neg Hx   . Endocrinopathy Neg Hx   . Endometrial cancer Neg Hx   . Endometriosis Neg Hx   . Enuresis Neg Hx   . Eosinophilic granuloma Neg Hx   . Epididymitis Neg Hx   . Erectile dysfunction Neg Hx   . Erythema nodosum Neg Hx   . Esophageal cancer Neg Hx   . Esophageal varices Neg Hx   . Esophagitis Neg Hx   . Exostosis Neg Hx   . Fabry's disease Neg Hx   . Factor IX deficiency Neg Hx   . Factor V Leiden deficiency Neg Hx   .  Factor VIII deficiency Neg Hx   . Failure to thrive Neg Hx   . Fainting  Neg Hx   . Familial dysautonomia Neg Hx   . Familial nephritis Neg Hx   . Familial polyposis Neg Hx   . Fanconi anemia Neg Hx   . Febrile seizures Neg Hx   . Fibrocystic breast disease Neg Hx   . Fibroids Neg Hx   . Fibromyalgia Neg Hx   . Food intolerance Neg Hx   . Fragile X syndrome Neg Hx   . Friedreich's ataxia Neg Hx   . Frontotemporal dementia Neg Hx   . Fuch's dystrophy Neg Hx   . Gait disorder Neg Hx   . Galactosemia Neg Hx   . Gallbladder disease Neg Hx   . Gaucher's disease Neg Hx   . Genodermatoses Neg Hx   . GER disease Neg Hx   . Gestational diabetes Neg Hx   . GI problems Neg Hx   . Glaucoma Neg Hx   . Glomerulonephritis Neg Hx   . Glucose-6-phos deficiency Neg Hx   . Glycogen storage disease Neg Hx   . Goiter Neg Hx   . Gonadal disorder Neg Hx   . Gout Neg Hx   . Graves' disease Neg Hx   . Growth hormone deficiency Neg Hx   . GU problems Neg Hx   . Hammer toes Neg Hx   . Hartnup's disease Neg Hx   . Hashimoto's thyroiditis Neg Hx   . Healthy Neg Hx   . Hearing loss Neg Hx   . Heart block Neg Hx   . Heart defect Neg Hx   . Heart disease Neg Hx   . Heart failure Neg Hx   . Heart murmur Neg Hx   . Hemangiomas Neg Hx   . Hematuria Neg Hx   . Hemochromatosis Neg Hx   . Hemolytic uremic syndrome Neg Hx   . Hemophilia Neg Hx   . Henoch-Schonlein purpura Neg Hx   . Hepatitis Neg Hx   . Hepatomegaly Neg Hx   . Hereditary spherocytosis Neg Hx   . Hernia Neg Hx   . Hiatal hernia Neg Hx   . High arches Neg Hx   . Hip dysplasia Neg Hx   . Hip fracture Neg Hx   . Hirschsprung's disease Neg Hx   . Hirsutism Neg Hx   . Histiocytosis X Neg Hx   . HIV Neg Hx   . HLA-B27 positive Neg Hx   . Hodgkin's lymphoma Neg Hx   . Homocystinuria Neg Hx   . Hunter's disease Neg Hx   . Huntington's disease Neg Hx   . Hydrocele Neg Hx   . Hydrocephalus Neg Hx   . Hygroma Neg Hx   . Hypercalcemia Neg Hx   . Hypereosinophilic syndrome Neg Hx   . Hyperinsulinemia Neg  Hx   . Hyperkalemia Neg Hx   . Hyperlipidemia Neg Hx   . Hypermobility Neg Hx   . Hypernasality Neg Hx   . Hyperopia Neg Hx   . Hyperparathyroidism Neg Hx   . Hypersomnolence Neg Hx   . Hypertension Neg Hx   . Hyperthyroidism Neg Hx   . Hypertrophic cardiomyopathy Neg Hx   . Hypokalemia Neg Hx   . Hypoparathyroidism Neg Hx   . Hypoplastic kidney Neg Hx   . Hypotension Neg Hx   . Hypothyroidism Neg Hx   . Idiopathic pulmonary fibrosis Neg Hx   . Idiopathic torsion dystonia Neg Hx   .  IgA nephropathy Neg Hx   . Immunodeficiency Neg Hx   . Imperforate anus Neg Hx   . Impotence Neg Hx   . Impulse control disorder Neg Hx   . Incompetent cervix Neg Hx   . Infertility Neg Hx   . Inflammatory bowel disease Neg Hx   . Inguinal hernia Neg Hx   . Insomnia Neg Hx   . Insulin resistance Neg Hx   . Interstitial cystitis Neg Hx   . Intestinal malrotation Neg Hx   . Intestinal polyp Neg Hx   . Intracerebral hemorrhage Neg Hx   . Iron deficiency Neg Hx   . Irregular heart beat Neg Hx   . Irritable bowel syndrome Neg Hx   . Jaundice Neg Hx   . Job's syndrome Neg Hx   . Joint hypermobility Neg Hx   . Juvenile idiopathic arthritis Neg Hx   . Kartagener's syndrome Neg Hx   . Kawasaki disease Neg Hx   . Keloids Neg Hx   . Kennedy's disease Neg Hx   . Keratoconus Neg Hx   . Kidney cancer Neg Hx   . Kidney disease Neg Hx   . Kidney failure Neg Hx   . Kidney nephrosis Neg Hx   . Klinefelter's syndrome Neg Hx   . Krabbe disease Neg Hx   . Kyphosis Neg Hx   . Labyrinthitis Neg Hx   . Lactose intolerance Neg Hx   . Language disorder Neg Hx   . Lead poisoning Neg Hx   . Learning disabilities Neg Hx   . Legg-Calve-Perthes disease Neg Hx   . Lesch-Nyhan syndrome Neg Hx   . Leukemia Neg Hx   . Lichen planus Neg Hx   . Li-Fraumeni syndrome Neg Hx   . Liver cancer Neg Hx   . Liver disease Neg Hx   . Long QT syndrome Neg Hx   . Lumbar disc disease Neg Hx   . Lung cancer Neg Hx   .  Lung disease Neg Hx   . Lupus Neg Hx   . Lymphoma Neg Hx   . Macrocephaly Neg Hx   . Macrosomia Neg Hx   . Macular degeneration Neg Hx   . Malignant hypertension Neg Hx   . Malignant hyperthermia Neg Hx   . Maple syrup urine disease Neg Hx   . Marfan syndrome Neg Hx   . Mastocytosis Neg Hx   . Melanoma Neg Hx   . Memory loss Neg Hx   . Meniere's disease Neg Hx   . Menstrual problems Neg Hx   . Mental illness Neg Hx   . Mental retardation Neg Hx   . Metabolic syndrome Neg Hx   . Microcephaly Neg Hx   . Migraines Neg Hx   . Milk intolerance Neg Hx   . Miscarriages / Stillbirths Neg Hx   . Mitochondrial disorder Neg Hx   . Mitral valve prolapse Neg Hx   . Motor neuron disease Neg Hx   . Movement disorder Neg Hx   . Moyamoya disease Neg Hx   . Multiple births Neg Hx   . Multiple endocrine neoplasia Neg Hx   . Multiple fractures Neg Hx   . Multiple myeloma Neg Hx   . Multiple sclerosis Neg Hx   . Muscle cancer Neg Hx   . Muscular dystrophy Neg Hx   . Myasthenia gravis Neg Hx   . Myelodysplastic syndrome Neg Hx   . Myocarditis Neg Hx   . Myoclonus Neg Hx   .  Myopathy Neg Hx   . Nail disease Neg Hx   . Narcolepsy Neg Hx   . Nephritis Neg Hx   . Nephrolithiasis Neg Hx   . Nephrotic syndrome Neg Hx   . Neural tube defect Neg Hx   . Neurodegenerative disease Neg Hx   . Neurofibromatosis Neg Hx   . Neuromuscular disorder Neg Hx   . Neuropathy Neg Hx   . Neutropenia Neg Hx   . Nevi Neg Hx   . Niemann-Pick disease Neg Hx   . Night blindness Neg Hx   . Nocturnal enuresis Neg Hx   . Nystagmus Neg Hx   . Obesity Neg Hx   . OCD Neg Hx   . ODD Neg Hx   . Orchitis Neg Hx   . Osler-Weber-Rendu syndrome Neg Hx   . Osteoarthritis Neg Hx   . Osteochondroma Neg Hx   . Osteogenesis imperfecta Neg Hx   . Osteopenia Neg Hx   . Osteoporosis Neg Hx   . Osteosarcoma Neg Hx   . Osteosclerosis Neg Hx   . Other Neg Hx   . Otitis media Neg Hx   . Ovarian cancer Neg Hx   . Ovarian  cysts Neg Hx   . Oxalosis Neg Hx   . Paget's disease of bone Neg Hx   . Pancreatic cancer Neg Hx   . Pancreatitis Neg Hx   . Panhypopituitarism Neg Hx   . Panic disorder Neg Hx   . Paranoid behavior Neg Hx   . Parasomnia Neg Hx   . Parkinsonism Neg Hx   . Patau's syndrome Neg Hx   . Pathological gambling Neg Hx   . PDD Neg Hx   . Pectus carinatum Neg Hx   . Pectus excavatum Neg Hx   . Pelvic inflammatory disease Neg Hx   . Pemphigus vulgaris Neg Hx   . Penile cancer Neg Hx   . Periodic limb movement Neg Hx   . Peripheral vascular disease Neg Hx   . Pernicious anemia Neg Hx   . Personality disorder Neg Hx   . Pes cavus Neg Hx   . Physical abuse Neg Hx   . Otilio Jefferson sequence Neg Hx   . PKU Neg Hx   . Plantar fasciitis Neg Hx   . Pleurisy Neg Hx   . Pneumonia Neg Hx   . Polychondritis Neg Hx   . Polycystic kidney disease Neg Hx   . Polycystic ovary syndrome Neg Hx   . Polycythemia Neg Hx   . Polymyalgia rheumatica Neg Hx   . Polymyositis Neg Hx   . Pompe disease Neg Hx   . Positive PPD/TB Exposure Neg Hx   . Posterior urethral valves Neg Hx   . Post-traumatic stress disorder Neg Hx   . Prader-Willi syndrome Neg Hx   . Premature birth Neg Hx   . Premature ovarian failure Neg Hx   . Preterm labor Neg Hx   . Prolactinoma Neg Hx   . Prostate cancer Neg Hx   . Prostatitis Neg Hx   . Protein C deficiency Neg Hx   . Protein S deficiency Neg Hx   . Proteinuria Neg Hx   . Prune belly syndrome Neg Hx   . Pruritis Neg Hx   . Pseudochol deficiency Neg Hx   . Pseudotumor cerebri Neg Hx   . Psoriasis Neg Hx   . Psychosis Neg Hx   . Ptosis Neg Hx   . Pulmonary embolism Neg Hx   .  Pulmonary fibrosis Neg Hx   . Pyelonephritis Neg Hx   . Pyloric stenosis Neg Hx   . Pyruvate dehydrogenase deficiency Neg Hx   . Rashes / Skin problems Neg Hx   . Raynaud syndrome Neg Hx   . Rectal cancer Neg Hx   . Recurrent abdominal pain Neg Hx   . Reiter's syndrome Neg Hx   . Renal  tubular acidosis Neg Hx   . Restless legs syndrome Neg Hx   . Retinal degeneration Neg Hx   . Retinal detachment Neg Hx   . Retinitis pigmentosa Neg Hx   . Retinoblastoma Neg Hx   . Retinopathy of prematurity Neg Hx   . Reye's syndrome Neg Hx   . Rheum arthritis Neg Hx   . Rheumatic fever Neg Hx   . Rheumatologic disease Neg Hx   . Rickets Neg Hx   . Rosacea Neg Hx   . Sacroiliitis Neg Hx   . Sarcoidosis Neg Hx   . Schizophrenia Neg Hx   . Scleritis Neg Hx   . Scleroderma Neg Hx   . Scoliosis Neg Hx   . Seasonal affective disorder Neg Hx   . Seizures Neg Hx   . Selective mutism Neg Hx   . Sensorineural hearing loss Neg Hx   . Severe combined immunodeficiency Neg Hx   . Severe sprains Neg Hx   . Sexual abuse Neg Hx   . Short stature Neg Hx   . Sick sinus syndrome Neg Hx   . Sickle cell anemia Neg Hx   . Sickle cell trait Neg Hx   . SIDS Neg Hx   . Single kidney Neg Hx   . Sinusitis Neg Hx   . Sjogren's syndrome Neg Hx   . Skeletal dysplasia Neg Hx   . Skin cancer Neg Hx   . Skin telangiectasia Neg Hx   . Sleep apnea Neg Hx   . Sleep disorder Neg Hx   . Sleep walking Neg Hx   . Snoring Neg Hx   . Social phobia Neg Hx   . Speech disorder Neg Hx   . Spina bifida Neg Hx   . Spinal muscular atrophy Neg Hx   . Splenomegaly Neg Hx   . Spondyloarthropathy Neg Hx   . Spondylolisthesis Neg Hx   . Spondylolysis Neg Hx   . Squamous cell carcinoma Neg Hx   . Stevens-Johnson syndrome Neg Hx   . Stickler syndrome Neg Hx   . Stomach cancer Neg Hx   . Strabismus Neg Hx   . Stroke Neg Hx   . Stuttering Neg Hx   . Subarachnoid hemorrhage Neg Hx   . Sudden death Neg Hx   . Suicidality Neg Hx   . Supraventricular tachycardia Neg Hx   . Swallowing difficulties Neg Hx   . Tall stature Neg Hx   . Tay-Sachs disease Neg Hx   . Testicular cancer Neg Hx   . Thalassemia Neg Hx   . Throat cancer Neg Hx   . Thrombocytopenia Neg Hx   . Thrombophilia Neg Hx   . Thrombophlebitis Neg  Hx   . Thrombosis Neg Hx   . Thymic aplasia Neg Hx   . Thyroid cancer Neg Hx   . Thyroid disease Neg Hx   . Thyroid nodules Neg Hx   . Tics Neg Hx   . Tongue cancer Neg Hx   . Torticollis Neg Hx   . Tourette syndrome Neg Hx   . Tracheal cancer Neg  Hx   . Tracheoesophageal fistual Neg Hx   . Tracheomalacia Neg Hx   . Transient ischemic attack Neg Hx   . Treacher Collins syndrome Neg Hx   . Tremor Neg Hx   . Tuberculosis Neg Hx   . Tuberous sclerosis Neg Hx   . Turner syndrome Neg Hx   . Ulcerative colitis Neg Hx   . Ulcers Neg Hx   . Undescended testes Neg Hx   . Unexplained death Neg Hx   . Urinary tract infection Neg Hx   . Urolithiasis Neg Hx   . Urticaria Neg Hx   . Uterine cancer Neg Hx   . Uveitis Neg Hx   . Vaginal cancer Neg Hx   . Valvular heart disease Neg Hx   . Vasculitis Neg Hx   . Velocardiofacial syndrome Neg Hx   . Venous thrombosis Neg Hx   . Verruca vulgaris  Neg Hx   . Vesicoureteral reflux Neg Hx   . Vision loss Neg Hx   . Vitamin D deficiency Neg Hx   . Vitiligo Neg Hx   . Voice disorder Neg Hx   . Von Gierke's disease Neg Hx   . Von Hippel-Lindau syndrome Neg Hx   . Von Willebrand disease Neg Hx   . Werdnig Hoffman Disease Neg Hx   . Williams syndrome Neg Hx   . Wilm's tumor Neg Hx   . Wilson's disease Neg Hx   . Wiskott-Aldrich syndrome Neg Hx   . Evelene Croon Parkinson White syndrome Neg Hx   . Xeroderma pigmentosa Neg Hx   . Heart attack Mother   . Heart attack Father     Review of Systems: The patient denies any heat or cold intolerance.  No weight gain or weight loss.  The patient denies headaches or blurry vision.  There is no cough or sputum production.  The patient denies dizziness.  There is no hematuria or hematochezia.  The patient denies any muscle aches or arthritis.  The patient denies any rash.  The patient denies frequent falling or instability.  There is no history of depression or anxiety.  All other systems were reviewed and are  negative.   Physical Exam: Filed Vitals:   12/03/11 1108  BP: 120/70  Pulse: 78   general appearance reveals a well-developed elderly woman in no distress.  She has poor vision secondary to macular degeneration and is legally blind.Pupils equal and reactive.   Extraocular Movements are full.  There is no scleral icterus.  The mouth and pharynx are normal.  The neck is supple.  The carotids reveal no bruits.  The jugular venous pressure is normal.  The thyroid is not enlarged.  There is no lymphadenopathy.  The precordium is quiet.  The first heart sound is normal.  The second heart sound is physiologically split.  There is no murmur gallop rub or click.  There is no abnormal lift or heave.  The abdomen is soft and nontender. Bowel sounds are normal. The liver and spleen are not enlarged. There Are no abdominal masses. There are no bruits.  Extremities show no phlebitis or edema.  She has evidence of the recent trauma to the left leg and has a dressing on the left lower leg.  EKG shows normal sinus rhythm and poor R-wave progression V1 through V4 and is unchanged since 07/16/94  Assessment / Plan: Continue same medication.  Recheck in 4 months for followup office visit.

## 2011-12-03 NOTE — Assessment & Plan Note (Signed)
Patient has not been experiencing any recent cardiac symptoms.  Her electrocardiogram today shows no ischemic changes.

## 2011-12-03 NOTE — Patient Instructions (Signed)
Your physician recommends that you continue on your current medications as directed. Please refer to the Current Medication list given to you today. Your physician wants you to follow-up in: 4 months You will receive a reminder letter in the mail two months in advance. If you don't receive a letter, please call our office to schedule the follow-up appointment.  

## 2011-12-03 NOTE — Assessment & Plan Note (Signed)
The patient has had no recurrence of her deep vein thrombosis.  He remains on long-term Coumadin.  She has had some recent trauma to each of her lower legs.  She fell going up stairs and injured her left lower leg and several days later she dropped a large flashlight on her right lower leg.  Both appear to be healing well.

## 2011-12-06 ENCOUNTER — Other Ambulatory Visit: Payer: Self-pay | Admitting: *Deleted

## 2011-12-06 MED ORDER — DILTIAZEM HCL ER COATED BEADS 240 MG PO CP24: 240.0000 mg | ORAL_CAPSULE | Freq: Every day | ORAL | Status: DC

## 2011-12-06 MED ORDER — BISOPROLOL-HYDROCHLOROTHIAZIDE 5-6.25 MG PO TABS: 1.0000 | ORAL_TABLET | Freq: Every day | ORAL | Status: DC

## 2011-12-06 NOTE — Telephone Encounter (Signed)
Refilled bisoprolol and diltiazem

## 2011-12-13 ENCOUNTER — Other Ambulatory Visit: Payer: Self-pay | Admitting: *Deleted

## 2011-12-13 MED ORDER — WARFARIN SODIUM 5 MG PO TABS: 5.0000 mg | ORAL_TABLET | ORAL | Status: DC

## 2011-12-17 ENCOUNTER — Other Ambulatory Visit: Payer: Self-pay | Admitting: Dermatology

## 2011-12-17 ENCOUNTER — Ambulatory Visit (INDEPENDENT_AMBULATORY_CARE_PROVIDER_SITE_OTHER): Payer: Medicare Other

## 2011-12-17 DIAGNOSIS — I82409 Acute embolism and thrombosis of unspecified deep veins of unspecified lower extremity: Secondary | ICD-10-CM

## 2012-01-08 ENCOUNTER — Other Ambulatory Visit: Payer: Self-pay | Admitting: Cardiology

## 2012-01-08 DIAGNOSIS — F419 Anxiety disorder, unspecified: Secondary | ICD-10-CM

## 2012-01-13 ENCOUNTER — Ambulatory Visit (INDEPENDENT_AMBULATORY_CARE_PROVIDER_SITE_OTHER): Payer: Medicare Other | Admitting: Pharmacist

## 2012-01-13 DIAGNOSIS — I82409 Acute embolism and thrombosis of unspecified deep veins of unspecified lower extremity: Secondary | ICD-10-CM

## 2012-01-13 LAB — POCT INR: INR: 2.6

## 2012-01-14 ENCOUNTER — Other Ambulatory Visit: Payer: Self-pay | Admitting: Cardiology

## 2012-01-14 DIAGNOSIS — F419 Anxiety disorder, unspecified: Secondary | ICD-10-CM

## 2012-01-14 NOTE — Telephone Encounter (Signed)
Refill on diazepam

## 2012-01-17 ENCOUNTER — Other Ambulatory Visit: Payer: Self-pay | Admitting: Cardiology

## 2012-01-17 DIAGNOSIS — Z1231 Encounter for screening mammogram for malignant neoplasm of breast: Secondary | ICD-10-CM

## 2012-02-11 ENCOUNTER — Ambulatory Visit (INDEPENDENT_AMBULATORY_CARE_PROVIDER_SITE_OTHER): Payer: Medicare Other | Admitting: *Deleted

## 2012-02-11 DIAGNOSIS — I82409 Acute embolism and thrombosis of unspecified deep veins of unspecified lower extremity: Secondary | ICD-10-CM

## 2012-02-11 LAB — POCT INR: INR: 1.9

## 2012-02-16 ENCOUNTER — Ambulatory Visit
Admission: RE | Admit: 2012-02-16 | Discharge: 2012-02-16 | Disposition: A | Discharge status: Home / Self Care | Payer: Medicare Other | Source: Ambulatory Visit | Attending: Cardiology | Admitting: Cardiology

## 2012-02-16 DIAGNOSIS — Z1231 Encounter for screening mammogram for malignant neoplasm of breast: Secondary | ICD-10-CM

## 2012-02-23 ENCOUNTER — Telehealth: Payer: Self-pay | Admitting: Cardiology

## 2012-02-23 NOTE — Telephone Encounter (Signed)
Advised patient

## 2012-02-23 NOTE — Telephone Encounter (Signed)
Leave Coumadin off for 2 days and then resume usual dose.

## 2012-02-23 NOTE — Telephone Encounter (Signed)
Pt calling re a huge bruise on her leg, and wants to know if she needs to be seen?

## 2012-02-23 NOTE — Telephone Encounter (Signed)
Last INR 1.9 on 5/17.  Denies injury, pain in groin area for about a week, bruise couple inches from groin.  The bruise is on same side where she had a fall and fracture a year or so ago.   Bruise is about 10 inches long and about 7 inches wide.  Patient only took 1/2 extra pill after last INR as instructed.  Patient concerned.  Will forward to  Dr. [name] for review

## 2012-02-25 ENCOUNTER — Telehealth: Payer: Self-pay | Admitting: Cardiology

## 2012-02-25 NOTE — Telephone Encounter (Signed)
Advised patient

## 2012-02-25 NOTE — Telephone Encounter (Signed)
Spoke with patient and there is no change in bruise, would like to know what to do.  Will forward to  Dr. [name] for review

## 2012-02-25 NOTE — Telephone Encounter (Signed)
Pt was to call melinda with update on her legs

## 2012-02-25 NOTE — Telephone Encounter (Signed)
Stay Off Coumadin for another 3 days until Monday

## 2012-02-25 NOTE — Telephone Encounter (Signed)
Left message to call back  

## 2012-02-25 NOTE — Telephone Encounter (Signed)
F/u   Patient calling for status update, she can be reached at [phone].

## 2012-02-28 ENCOUNTER — Telehealth: Payer: Self-pay | Admitting: Cardiology

## 2012-02-28 NOTE — Telephone Encounter (Signed)
New problem:  Patient calling stating that Dr. [name] is treating her bad leg x 5 days  wants to know her next step.

## 2012-02-28 NOTE — Telephone Encounter (Signed)
Pt wants to know if she should stay off her coumadin or restart it?  The bruise is same color (blackish/blue) and the same size.  No new bruising.

## 2012-02-28 NOTE — Telephone Encounter (Signed)
Ok for pt to restart coumadin at previous dose per Dr [name].  Pt was notified.

## 2012-03-10 ENCOUNTER — Ambulatory Visit (INDEPENDENT_AMBULATORY_CARE_PROVIDER_SITE_OTHER): Payer: Medicare Other

## 2012-03-10 DIAGNOSIS — I82409 Acute embolism and thrombosis of unspecified deep veins of unspecified lower extremity: Secondary | ICD-10-CM

## 2012-03-11 ENCOUNTER — Other Ambulatory Visit: Payer: Self-pay | Admitting: Cardiology

## 2012-03-11 DIAGNOSIS — F419 Anxiety disorder, unspecified: Secondary | ICD-10-CM

## 2012-03-13 MED ORDER — DIAZEPAM 5 MG PO TABS: 5.0000 mg | ORAL_TABLET | Freq: Every day | ORAL | Status: DC | PRN

## 2012-03-13 NOTE — Telephone Encounter (Signed)
Refill approved by Dr. [name]

## 2012-04-04 ENCOUNTER — Encounter: Payer: Self-pay | Admitting: Cardiology

## 2012-04-04 ENCOUNTER — Ambulatory Visit (INDEPENDENT_AMBULATORY_CARE_PROVIDER_SITE_OTHER): Payer: Medicare Other | Admitting: Cardiology

## 2012-04-04 VITALS — BP 138/66 | HR 68 | Ht 67.0 in | Wt 133.6 lb

## 2012-04-04 DIAGNOSIS — H3530 Unspecified macular degeneration: Secondary | ICD-10-CM

## 2012-04-04 DIAGNOSIS — M1990 Unspecified osteoarthritis, unspecified site: Secondary | ICD-10-CM

## 2012-04-04 DIAGNOSIS — E7800 Pure hypercholesterolemia, unspecified: Secondary | ICD-10-CM

## 2012-04-04 DIAGNOSIS — Z7901 Long term (current) use of anticoagulants: Secondary | ICD-10-CM

## 2012-04-04 DIAGNOSIS — I119 Hypertensive heart disease without heart failure: Secondary | ICD-10-CM

## 2012-04-04 DIAGNOSIS — Z299 Encounter for prophylactic measures, unspecified: Secondary | ICD-10-CM

## 2012-04-04 DIAGNOSIS — E039 Hypothyroidism, unspecified: Secondary | ICD-10-CM

## 2012-04-04 DIAGNOSIS — R634 Abnormal weight loss: Secondary | ICD-10-CM | POA: Insufficient documentation

## 2012-04-04 NOTE — Patient Instructions (Addendum)
Your physician recommends that you continue on your current medications as directed. Please refer to the Current Medication list given to you today.  Your physician recommends that you schedule a follow-up appointment in: 4 months with fasting labs (lp/bmet/hfp/cbc/tsh)  

## 2012-04-04 NOTE — Assessment & Plan Note (Signed)
Patient is clinically euthyroid.  We will plan to recheck her thyroid removed when we draw labs at her next visit

## 2012-04-04 NOTE — Progress Notes (Signed)
Erlene Quan Date of Birth:  [birth year] [facility] [address] [place], [place]  [address] [phone]         Fax   [phone]  History of Present Illness: This pleasant 78 year old woman is seen for a scheduled followup office visit.  She has a past history of hypertensive cardiovascular disease and a remote history of deep vein recurrent thrombosis.  He has hypothyroidism on medication.  She also has osteoarthritis.  His last visit she's had more difficulty walking and her orthopedist Dr. Carleene Cooper is making arrangements to send her to either a neurologist or neurosurgeon for further neurologic testing on why she is having difficulty with her legs. The patient is disabled because of macular degeneration.  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  She is unable to see well enough to cook.  She has lost 25 pounds since we saw her 4 months ago.  He states she just does not get hungry and doesn't eat much.  She is not having any localizing signs or symptoms edges abdominal pain or hematochezia or melena or change in bowel habits.  Current Outpatient Prescriptions  Medication Sig Dispense Refill  . acetaminophen (TYLENOL) 325 MG tablet Take 650 mg by mouth every 6 (six) hours as needed.        . bisoprolol-hydrochlorothiazide (ZIAC) 5-6.25 MG per tablet Take 1 tablet by mouth daily.  90 tablet  3  . diazepam (VALIUM) 5 MG tablet Take 1 tablet (5 mg total) by mouth daily as needed for anxiety.  60 tablet  0  . diltiazem (CARDIZEM CD) 240 MG 24 hr capsule Take 1 capsule (240 mg total) by mouth daily.  90 capsule  3  . hydrochlorothiazide (HYDRODIURIL) 12.5 MG tablet Take 1 tablet (12.5 mg total) by mouth daily.  90 tablet  3  . levothyroxine (SYNTHROID, LEVOTHROID) 50 MCG tablet Take 2 tablets twice weekly and 1 tablet 5 days a week  105 tablet  2  . predniSONE (DELTASONE) 5 MG tablet TAKE 1 TABLET DAILY  90 tablet  2    . warfarin (COUMADIN) 5 MG tablet Take 1 tablet (5 mg total) by mouth as directed.  90 tablet  0    Allergies  Allergen Reactions  . Cephalexin   . Morphine And Related     nausea  . Penicillins   . Sinutab (Chlorphen-Pseudoephed-Apap)   . Skelaxin   . Tetanus Toxoids   . Zestril (Lisinopril)     Patient Active Problem List  Diagnosis  . Hypothyroidism  . Osteoporosis  . Macular degeneration  . Deep vein thrombosis (DVT)  . Benign hypertensive heart disease without heart failure  . Osteoarthritis  . Weight loss, unintentional    History  Smoking status  . Never Smoker   Smokeless tobacco  . Not on file    History  Alcohol Use No    Family History  Problem Relation Age of Onset  . Abnormal EKG Neg Hx   . Abnormal newborn screen Neg Hx   . Achalasia Neg Hx   . Achondroplasia Neg Hx   . Acne Neg Hx   . Acromegaly Neg Hx   . Actinic keratosis Neg Hx   . Acute lymphoblastic leukemia Neg Hx   . Addison's disease Neg Hx   . Adrenal disorder Neg Hx   . Albinism Neg Hx   . Alcohol abuse Neg Hx   . Alkaptonuria Neg Hx   .  Allergic rhinitis Neg Hx   . Allergies Neg Hx   . Allergy (severe) Neg Hx   . Alopecia Neg Hx   . Alpha-1 antitrypsin deficiency Neg Hx   . Alport syndrome Neg Hx   . ALS Neg Hx   . Alzheimer's disease Neg Hx   . Ambiguous genitalia Neg Hx   . Amblyopia Neg Hx   . Amenorrhea Neg Hx   . Anal fissures Neg Hx   . Anemia Neg Hx   . Anesthesia problems Neg Hx   . Aneurysm Neg Hx   . Angelman syndrome Neg Hx   . Angina Neg Hx   . Angioedema Neg Hx   . Ankylosing spondylitis Neg Hx   . Anorectal malformation Neg Hx   . Anorexia nervosa Neg Hx   . Anti-cardiolipin syndrome Neg Hx   . Antithrombin III deficiency Neg Hx   . Anuerysm Neg Hx   . Anxiety disorder Neg Hx   . Aortic aneurysm Neg Hx   . Aortic dissection Neg Hx   . Aortic stenosis Neg Hx   . Aphthous stomatitis Neg Hx   . Aplastic anemia Neg Hx   . Appendicitis Neg Hx   .  Apraxia Neg Hx   . Arnold-Chiari malformation Neg Hx   . Arrhythmia Neg Hx   . Arthritis Neg Hx   . Asperger's syndrome Neg Hx   . Asthma Neg Hx   . Ataxia Neg Hx   . Ataxia telangiectasia Neg Hx   . Atopy Neg Hx   . Atrial fibrillation Neg Hx   . Atrophic kidney Neg Hx   . Auditory processing disorder Neg Hx   . Autism Neg Hx   . Autism spectrum disorder Neg Hx   . Autoimmune disease Neg Hx   . AVM Neg Hx   . Baker's cyst Neg Hx   . Barrett's esophagus Neg Hx   . Bartter's syndrome Neg Hx   . Basal cell carcinoma Neg Hx   . Behavior problems Neg Hx   . Bell's palsy Neg Hx   . Benign prostatic hyperplasia Neg Hx   . Bipolar disorder Neg Hx   . Birth defects Neg Hx   . Birth marks Neg Hx   . Blindness Neg Hx   . Bone cancer Neg Hx   . BOR syndrome Neg Hx   . Bow legs Neg Hx   . Bradycardia Neg Hx   . Brain cancer Neg Hx   . BRCA 1/2 Neg Hx   . Breast cancer Neg Hx   . Broken bones Neg Hx   . Bronchiolitis Neg Hx   . Bronchopulmonary dysplasia Neg Hx   . Bruton's disease Neg Hx   . Bulemia Neg Hx   . Bullous pemphigoid Neg Hx   . Bunion Neg Hx   . Bursitis Neg Hx   . Caf-au-lait spots Neg Hx   . Calcium disorder Neg Hx   . Canavan disease Neg Hx   . Cancer Neg Hx   . Cardiomyopathy Neg Hx   . Carpal tunnel syndrome Neg Hx   . Cataracts Neg Hx   . Celiac disease Neg Hx   . Cerebral aneurysm Neg Hx   . Cerebral palsy Neg Hx   . Cervical cancer Neg Hx   . Cervical polyp Neg Hx   . Cervicitis Neg Hx   . Chalasia Neg Hx   . Chalazion Neg Hx   . Charcot-Marie-Tooth disease Neg Hx   .  Chediak-Higashi syndrome Neg Hx   . Chiari malformation Neg Hx   . Childhood respiratory disease Neg Hx   . Choanal atresia Neg Hx   . Cholecystitis Neg Hx   . Cholelithiasis Neg Hx   . Cholesteatoma Neg Hx   . Chondromalacia Neg Hx   . Chorea Neg Hx   . Chromosomal disorder Neg Hx   . Chronic bronchitis Neg Hx   . Chronic fatigue Neg Hx   . Chronic granulomatous disease Neg  Hx   . Chronic infections Neg Hx   . Cirrhosis Neg Hx   . Cleft lip Neg Hx   . Cleft palate Neg Hx   . Clotting disorder Neg Hx   . Club foot Neg Hx   . Coarctation of the aorta Neg Hx   . Colitis Neg Hx   . Collagen disease Neg Hx   . Colon cancer Neg Hx   . Colon polyps Neg Hx   . Colonic polyp Neg Hx   . Color blindness Neg Hx   . Conduct disorder Neg Hx   . Conductive hearing loss Neg Hx   . Congenital adrenal hyperplasia Neg Hx   . Congenital heart disease Neg Hx   . Conjunctivitis Neg Hx   . Consanguinity Neg Hx   . Constipation Neg Hx   . COPD Neg Hx   . Corneal abrasion Neg Hx   . Corneal ulcer Neg Hx   . Coronary aneurysm Neg Hx   . Coronary artery disease Neg Hx   . Cowden syndrome Neg Hx   . Craniosynostosis Neg Hx   . Cri-du-chat syndrome Neg Hx   . Crohn's disease Neg Hx   . Cushing syndrome Neg Hx   . Cystic fibrosis Neg Hx   . Cystic kidney disease Neg Hx   . Cystinosis Neg Hx   . Decreased libido Neg Hx   . Deep vein thrombosis Neg Hx   . Delayed menopause Neg Hx   . Delayed puberty Neg Hx   . Dementia Neg Hx   . Dental caries Neg Hx   . Depression Neg Hx   . Dermatomyositis Neg Hx   . DES usage Neg Hx   . Developmental delay Neg Hx   . Diabetes Neg Hx   . Diabetes insipidus Neg Hx   . Diabetes type I Neg Hx   . Diabetes type II Neg Hx   . Diabetic kidney disease Neg Hx   . DiGeorge syndrome Neg Hx   . Dilated cardiomyopathy Neg Hx   . Dislocations Neg Hx   . Diverticulitis Neg Hx   . Diverticulosis Neg Hx   . Down syndrome Neg Hx   . Drug abuse Neg Hx   . Dupuytren's contracture Neg Hx   . Dwarfism Neg Hx   . Dysfunctional uterine bleeding Neg Hx   . Dysmenorrhea Neg Hx   . Dyspareunia Neg Hx   . Dysphagia Neg Hx   . Dysrhythmia Neg Hx   . Dystonia Neg Hx   . Early death Neg Hx   . Early menopause Neg Hx   . Early puberty Neg Hx   . Eating disorder Neg Hx   . Eclampsia Neg Hx   . Ectodermal dysplasia Neg Hx   . Eczema Neg Hx   .  Edema Neg Hx   . Edward's syndrome Neg Hx   . Ehlers-Danlos syndrome Neg Hx   . Emotional abuse Neg Hx   . Emphysema Neg Hx   .  Encopresis Neg Hx   . Endocrine tumor Neg Hx   . Endocrinopathy Neg Hx   . Endometrial cancer Neg Hx   . Endometriosis Neg Hx   . Enuresis Neg Hx   . Eosinophilic granuloma Neg Hx   . Epididymitis Neg Hx   . Erectile dysfunction Neg Hx   . Erythema nodosum Neg Hx   . Esophageal cancer Neg Hx   . Esophageal varices Neg Hx   . Esophagitis Neg Hx   . Exostosis Neg Hx   . Fabry's disease Neg Hx   . Factor IX deficiency Neg Hx   . Factor V Leiden deficiency Neg Hx   . Factor VIII deficiency Neg Hx   . Failure to thrive Neg Hx   . Fainting Neg Hx   . Familial dysautonomia Neg Hx   . Familial nephritis Neg Hx   . Familial polyposis Neg Hx   . Fanconi anemia Neg Hx   . Febrile seizures Neg Hx   . Fibrocystic breast disease Neg Hx   . Fibroids Neg Hx   . Fibromyalgia Neg Hx   . Food intolerance Neg Hx   . Fragile X syndrome Neg Hx   . Friedreich's ataxia Neg Hx   . Frontotemporal dementia Neg Hx   . Fuch's dystrophy Neg Hx   . Gait disorder Neg Hx   . Galactosemia Neg Hx   . Gallbladder disease Neg Hx   . Gaucher's disease Neg Hx   . Genodermatoses Neg Hx   . GER disease Neg Hx   . Gestational diabetes Neg Hx   . GI problems Neg Hx   . Glaucoma Neg Hx   . Glomerulonephritis Neg Hx   . Glucose-6-phos deficiency Neg Hx   . Glycogen storage disease Neg Hx   . Goiter Neg Hx   . Gonadal disorder Neg Hx   . Gout Neg Hx   . Graves' disease Neg Hx   . Growth hormone deficiency Neg Hx   . GU problems Neg Hx   . Hammer toes Neg Hx   . Hartnup's disease Neg Hx   . Hashimoto's thyroiditis Neg Hx   . Healthy Neg Hx   . Hearing loss Neg Hx   . Heart block Neg Hx   . Heart defect Neg Hx   . Heart disease Neg Hx   . Heart failure Neg Hx   . Heart murmur Neg Hx   . Hemangiomas Neg Hx   . Hematuria Neg Hx   . Hemochromatosis Neg Hx   . Hemolytic  uremic syndrome Neg Hx   . Hemophilia Neg Hx   . Henoch-Schonlein purpura Neg Hx   . Hepatitis Neg Hx   . Hepatomegaly Neg Hx   . Hereditary spherocytosis Neg Hx   . Hernia Neg Hx   . Hiatal hernia Neg Hx   . High arches Neg Hx   . Hip dysplasia Neg Hx   . Hip fracture Neg Hx   . Hirschsprung's disease Neg Hx   . Hirsutism Neg Hx   . Histiocytosis X Neg Hx   . HIV Neg Hx   . HLA-B27 positive Neg Hx   . Hodgkin's lymphoma Neg Hx   . Homocystinuria Neg Hx   . Hunter's disease Neg Hx   . Huntington's disease Neg Hx   . Hydrocele Neg Hx   . Hydrocephalus Neg Hx   . Hygroma Neg Hx   . Hypercalcemia Neg Hx   . Hypereosinophilic syndrome Neg Hx   .  Hyperinsulinemia Neg Hx   . Hyperkalemia Neg Hx   . Hyperlipidemia Neg Hx   . Hypermobility Neg Hx   . Hypernasality Neg Hx   . Hyperopia Neg Hx   . Hyperparathyroidism Neg Hx   . Hypersomnolence Neg Hx   . Hypertension Neg Hx   . Hyperthyroidism Neg Hx   . Hypertrophic cardiomyopathy Neg Hx   . Hypokalemia Neg Hx   . Hypoparathyroidism Neg Hx   . Hypoplastic kidney Neg Hx   . Hypotension Neg Hx   . Hypothyroidism Neg Hx   . Idiopathic pulmonary fibrosis Neg Hx   . Idiopathic torsion dystonia Neg Hx   . IgA nephropathy Neg Hx   . Immunodeficiency Neg Hx   . Imperforate anus Neg Hx   . Impotence Neg Hx   . Impulse control disorder Neg Hx   . Incompetent cervix Neg Hx   . Infertility Neg Hx   . Inflammatory bowel disease Neg Hx   . Inguinal hernia Neg Hx   . Insomnia Neg Hx   . Insulin resistance Neg Hx   . Interstitial cystitis Neg Hx   . Intestinal malrotation Neg Hx   . Intestinal polyp Neg Hx   . Intracerebral hemorrhage Neg Hx   . Iron deficiency Neg Hx   . Irregular heart beat Neg Hx   . Irritable bowel syndrome Neg Hx   . Jaundice Neg Hx   . Job's syndrome Neg Hx   . Joint hypermobility Neg Hx   . Juvenile idiopathic arthritis Neg Hx   . Kartagener's syndrome Neg Hx   . Kawasaki disease Neg Hx   . Keloids  Neg Hx   . Kennedy's disease Neg Hx   . Keratoconus Neg Hx   . Kidney cancer Neg Hx   . Kidney disease Neg Hx   . Kidney failure Neg Hx   . Kidney nephrosis Neg Hx   . Klinefelter's syndrome Neg Hx   . Krabbe disease Neg Hx   . Kyphosis Neg Hx   . Labyrinthitis Neg Hx   . Lactose intolerance Neg Hx   . Language disorder Neg Hx   . Lead poisoning Neg Hx   . Learning disabilities Neg Hx   . Legg-Calve-Perthes disease Neg Hx   . Lesch-Nyhan syndrome Neg Hx   . Leukemia Neg Hx   . Lichen planus Neg Hx   . Li-Fraumeni syndrome Neg Hx   . Liver cancer Neg Hx   . Liver disease Neg Hx   . Long QT syndrome Neg Hx   . Lumbar disc disease Neg Hx   . Lung cancer Neg Hx   . Lung disease Neg Hx   . Lupus Neg Hx   . Lymphoma Neg Hx   . Macrocephaly Neg Hx   . Macrosomia Neg Hx   . Macular degeneration Neg Hx   . Malignant hypertension Neg Hx   . Malignant hyperthermia Neg Hx   . Maple syrup urine disease Neg Hx   . Marfan syndrome Neg Hx   . Mastocytosis Neg Hx   . Melanoma Neg Hx   . Memory loss Neg Hx   . Meniere's disease Neg Hx   . Menstrual problems Neg Hx   . Mental illness Neg Hx   . Mental retardation Neg Hx   . Metabolic syndrome Neg Hx   . Microcephaly Neg Hx   . Migraines Neg Hx   . Milk intolerance Neg Hx   . Miscarriages / Stillbirths Neg  Hx   . Mitochondrial disorder Neg Hx   . Mitral valve prolapse Neg Hx   . Motor neuron disease Neg Hx   . Movement disorder Neg Hx   . Moyamoya disease Neg Hx   . Multiple births Neg Hx   . Multiple endocrine neoplasia Neg Hx   . Multiple fractures Neg Hx   . Multiple myeloma Neg Hx   . Multiple sclerosis Neg Hx   . Muscle cancer Neg Hx   . Muscular dystrophy Neg Hx   . Myasthenia gravis Neg Hx   . Myelodysplastic syndrome Neg Hx   . Myocarditis Neg Hx   . Myoclonus Neg Hx   . Myopathy Neg Hx   . Nail disease Neg Hx   . Narcolepsy Neg Hx   . Nephritis Neg Hx   . Nephrolithiasis Neg Hx   . Nephrotic syndrome Neg Hx     . Neural tube defect Neg Hx   . Neurodegenerative disease Neg Hx   . Neurofibromatosis Neg Hx   . Neuromuscular disorder Neg Hx   . Neuropathy Neg Hx   . Neutropenia Neg Hx   . Nevi Neg Hx   . Niemann-Pick disease Neg Hx   . Night blindness Neg Hx   . Nocturnal enuresis Neg Hx   . Nystagmus Neg Hx   . Obesity Neg Hx   . OCD Neg Hx   . ODD Neg Hx   . Orchitis Neg Hx   . Osler-Weber-Rendu syndrome Neg Hx   . Osteoarthritis Neg Hx   . Osteochondroma Neg Hx   . Osteogenesis imperfecta Neg Hx   . Osteopenia Neg Hx   . Osteoporosis Neg Hx   . Osteosarcoma Neg Hx   . Osteosclerosis Neg Hx   . Other Neg Hx   . Otitis media Neg Hx   . Ovarian cancer Neg Hx   . Ovarian cysts Neg Hx   . Oxalosis Neg Hx   . Paget's disease of bone Neg Hx   . Pancreatic cancer Neg Hx   . Pancreatitis Neg Hx   . Panhypopituitarism Neg Hx   . Panic disorder Neg Hx   . Paranoid behavior Neg Hx   . Parasomnia Neg Hx   . Parkinsonism Neg Hx   . Patau's syndrome Neg Hx   . Pathological gambling Neg Hx   . PDD Neg Hx   . Pectus carinatum Neg Hx   . Pectus excavatum Neg Hx   . Pelvic inflammatory disease Neg Hx   . Pemphigus vulgaris Neg Hx   . Penile cancer Neg Hx   . Periodic limb movement Neg Hx   . Peripheral vascular disease Neg Hx   . Pernicious anemia Neg Hx   . Personality disorder Neg Hx   . Pes cavus Neg Hx   . Physical abuse Neg Hx   . Otilio Jefferson sequence Neg Hx   . PKU Neg Hx   . Plantar fasciitis Neg Hx   . Pleurisy Neg Hx   . Pneumonia Neg Hx   . Polychondritis Neg Hx   . Polycystic kidney disease Neg Hx   . Polycystic ovary syndrome Neg Hx   . Polycythemia Neg Hx   . Polymyalgia rheumatica Neg Hx   . Polymyositis Neg Hx   . Pompe disease Neg Hx   . Positive PPD/TB Exposure Neg Hx   . Posterior urethral valves Neg Hx   . Post-traumatic stress disorder Neg Hx   . Prader-Willi syndrome Neg Hx   .  Premature birth Neg Hx   . Premature ovarian failure Neg Hx   . Preterm  labor Neg Hx   . Prolactinoma Neg Hx   . Prostate cancer Neg Hx   . Prostatitis Neg Hx   . Protein C deficiency Neg Hx   . Protein S deficiency Neg Hx   . Proteinuria Neg Hx   . Prune belly syndrome Neg Hx   . Pruritis Neg Hx   . Pseudochol deficiency Neg Hx   . Pseudotumor cerebri Neg Hx   . Psoriasis Neg Hx   . Psychosis Neg Hx   . Ptosis Neg Hx   . Pulmonary embolism Neg Hx   . Pulmonary fibrosis Neg Hx   . Pyelonephritis Neg Hx   . Pyloric stenosis Neg Hx   . Pyruvate dehydrogenase deficiency Neg Hx   . Rashes / Skin problems Neg Hx   . Raynaud syndrome Neg Hx   . Rectal cancer Neg Hx   . Recurrent abdominal pain Neg Hx   . Reiter's syndrome Neg Hx   . Renal tubular acidosis Neg Hx   . Restless legs syndrome Neg Hx   . Retinal degeneration Neg Hx   . Retinal detachment Neg Hx   . Retinitis pigmentosa Neg Hx   . Retinoblastoma Neg Hx   . Retinopathy of prematurity Neg Hx   . Reye's syndrome Neg Hx   . Rheum arthritis Neg Hx   . Rheumatic fever Neg Hx   . Rheumatologic disease Neg Hx   . Rickets Neg Hx   . Rosacea Neg Hx   . Sacroiliitis Neg Hx   . Sarcoidosis Neg Hx   . Schizophrenia Neg Hx   . Scleritis Neg Hx   . Scleroderma Neg Hx   . Scoliosis Neg Hx   . Seasonal affective disorder Neg Hx   . Seizures Neg Hx   . Selective mutism Neg Hx   . Sensorineural hearing loss Neg Hx   . Severe combined immunodeficiency Neg Hx   . Severe sprains Neg Hx   . Sexual abuse Neg Hx   . Short stature Neg Hx   . Sick sinus syndrome Neg Hx   . Sickle cell anemia Neg Hx   . Sickle cell trait Neg Hx   . SIDS Neg Hx   . Single kidney Neg Hx   . Sinusitis Neg Hx   . Sjogren's syndrome Neg Hx   . Skeletal dysplasia Neg Hx   . Skin cancer Neg Hx   . Skin telangiectasia Neg Hx   . Sleep apnea Neg Hx   . Sleep disorder Neg Hx   . Sleep walking Neg Hx   . Snoring Neg Hx   . Social phobia Neg Hx   . Speech disorder Neg Hx   . Spina bifida Neg Hx   . Spinal muscular  atrophy Neg Hx   . Splenomegaly Neg Hx   . Spondyloarthropathy Neg Hx   . Spondylolisthesis Neg Hx   . Spondylolysis Neg Hx   . Squamous cell carcinoma Neg Hx   . Stevens-Johnson syndrome Neg Hx   . Stickler syndrome Neg Hx   . Stomach cancer Neg Hx   . Strabismus Neg Hx   . Stroke Neg Hx   . Stuttering Neg Hx   . Subarachnoid hemorrhage Neg Hx   . Sudden death Neg Hx   . Suicidality Neg Hx   . Supraventricular tachycardia Neg Hx   . Swallowing difficulties Neg Hx   .  Tall stature Neg Hx   . Tay-Sachs disease Neg Hx   . Testicular cancer Neg Hx   . Thalassemia Neg Hx   . Throat cancer Neg Hx   . Thrombocytopenia Neg Hx   . Thrombophilia Neg Hx   . Thrombophlebitis Neg Hx   . Thrombosis Neg Hx   . Thymic aplasia Neg Hx   . Thyroid cancer Neg Hx   . Thyroid disease Neg Hx   . Thyroid nodules Neg Hx   . Tics Neg Hx   . Tongue cancer Neg Hx   . Torticollis Neg Hx   . Tourette syndrome Neg Hx   . Tracheal cancer Neg Hx   . Tracheoesophageal fistual Neg Hx   . Tracheomalacia Neg Hx   . Transient ischemic attack Neg Hx   . Treacher Collins syndrome Neg Hx   . Tremor Neg Hx   . Tuberculosis Neg Hx   . Tuberous sclerosis Neg Hx   . Turner syndrome Neg Hx   . Ulcerative colitis Neg Hx   . Ulcers Neg Hx   . Undescended testes Neg Hx   . Unexplained death Neg Hx   . Urinary tract infection Neg Hx   . Urolithiasis Neg Hx   . Urticaria Neg Hx   . Uterine cancer Neg Hx   . Uveitis Neg Hx   . Vaginal cancer Neg Hx   . Valvular heart disease Neg Hx   . Vasculitis Neg Hx   . Velocardiofacial syndrome Neg Hx   . Venous thrombosis Neg Hx   . Verruca vulgaris  Neg Hx   . Vesicoureteral reflux Neg Hx   . Vision loss Neg Hx   . Vitamin D deficiency Neg Hx   . Vitiligo Neg Hx   . Voice disorder Neg Hx   . Von Gierke's disease Neg Hx   . Von Hippel-Lindau syndrome Neg Hx   . Von Willebrand disease Neg Hx   . Werdnig Hoffman Disease Neg Hx   . Williams syndrome Neg Hx   .  Wilm's tumor Neg Hx   . Wilson's disease Neg Hx   . Wiskott-Aldrich syndrome Neg Hx   . Evelene Croon Parkinson White syndrome Neg Hx   . Xeroderma pigmentosa Neg Hx   . Heart attack Mother   . Heart attack Father     Review of Systems: Constitutional: no fever chills diaphoresis or fatigue or change in weight.  Head and neck: no hearing loss, no epistaxis, no photophobia or visual disturbance. Respiratory: No cough, shortness of breath or wheezing. Cardiovascular: No chest pain peripheral edema, palpitations. Gastrointestinal: No abdominal distention, no abdominal pain, no change in bowel habits hematochezia or melena. Genitourinary: No dysuria, no frequency, no urgency, no nocturia. Musculoskeletal:No arthralgias, no back pain, no gait disturbance or myalgias. Neurological: No dizziness, no headaches, no numbness, no seizures, no syncope, no weakness, no tremors. Hematologic: No lymphadenopathy, no easy bruising. Psychiatric: No confusion, no hallucinations, no sleep disturbance.    Physical Exam: Filed Vitals:   04/04/12 1056  BP: 138/66  Pulse: 68   the general appearance reveals a well-developed well-nourished woman in no distress.  He does not look like she has lost that much weight.The head and neck exam  Extraocular movements are full.  Vision is decreased  There is no scleral icterus.  The mouth and pharynx are normal.  The neck is supple.  The carotids reveal no bruits.  The jugular venous pressure is normal.  The  thyroid is not  enlarged.  There is no lymphadenopathy.  The chest is clear to percussion and auscultation.  There are no rales or rhonchi.  Expansion of the chest is symmetrical.  The precordium is quiet.  The first heart sound is normal.  The second heart sound is physiologically split.  There is no murmur gallop rub or click.  There is no abnormal lift or heave.  The abdomen is soft and nontender.  The bowel sounds are normal.  The liver and spleen are not enlarged.  There  are no abdominal masses.  There are no abdominal bruits.  Extremities reveal good pedal pulses.  There is no phlebitis or edema.  There is no cyanosis or clubbing.  Strength is normal and symmetrical in all extremities.  There is no lateralizing weakness.  There are no sensory deficits.  The skin is warm and dry.  There is no rash.     Assessment / Plan: The patient is to continue same medication.  He will try to eat more to prevent further weight loss.  He does not talk and so she is dependent on her nephew who lives with her to bring in food and also a neighbor helps her to go shopping.  When she returns in 4 months for followup visit we will plan to do CBC and thyroid function studies as well as lipid panel and chemistries

## 2012-04-04 NOTE — Assessment & Plan Note (Signed)
She has lost 25 pounds since last visit.  She was surprised that she has lost that much because she really has not been feeling.  Because of her leg problem she has difficulty ambulating

## 2012-04-04 NOTE — Assessment & Plan Note (Signed)
The patient has bilateral macular degeneration.  She is requiring injections into the left eye for wet macular degeneration

## 2012-04-04 NOTE — Assessment & Plan Note (Signed)
The patient has not had any symptoms of congestive heart failure.  She is on low-dose diuretic for blood pressure.  Denies any chest pain or angina.

## 2012-04-19 ENCOUNTER — Other Ambulatory Visit: Payer: Self-pay | Admitting: *Deleted

## 2012-04-19 MED ORDER — WARFARIN SODIUM 5 MG PO TABS: ORAL_TABLET | ORAL | Status: DC

## 2012-04-21 ENCOUNTER — Ambulatory Visit (INDEPENDENT_AMBULATORY_CARE_PROVIDER_SITE_OTHER): Payer: Medicare Other | Admitting: Pharmacist

## 2012-04-21 DIAGNOSIS — I82409 Acute embolism and thrombosis of unspecified deep veins of unspecified lower extremity: Secondary | ICD-10-CM

## 2012-04-21 LAB — POCT INR: INR: 2.3

## 2012-05-08 ENCOUNTER — Other Ambulatory Visit: Payer: Self-pay | Admitting: Cardiology

## 2012-05-08 DIAGNOSIS — F419 Anxiety disorder, unspecified: Secondary | ICD-10-CM

## 2012-05-10 ENCOUNTER — Other Ambulatory Visit: Payer: Self-pay | Admitting: Cardiology

## 2012-05-10 DIAGNOSIS — F419 Anxiety disorder, unspecified: Secondary | ICD-10-CM

## 2012-05-10 NOTE — Telephone Encounter (Signed)
Refill on diazepam 5mg  to [facility]

## 2012-05-10 NOTE — Telephone Encounter (Signed)
New msg [facility] wants to know strength of diazapam Please call

## 2012-06-02 ENCOUNTER — Ambulatory Visit (INDEPENDENT_AMBULATORY_CARE_PROVIDER_SITE_OTHER): Payer: Medicare Other | Admitting: *Deleted

## 2012-06-02 DIAGNOSIS — I82409 Acute embolism and thrombosis of unspecified deep veins of unspecified lower extremity: Secondary | ICD-10-CM

## 2012-06-02 LAB — POCT INR: INR: 3.2

## 2012-07-14 ENCOUNTER — Ambulatory Visit (INDEPENDENT_AMBULATORY_CARE_PROVIDER_SITE_OTHER): Payer: Medicare Other | Admitting: Pharmacist

## 2012-07-14 ENCOUNTER — Other Ambulatory Visit: Payer: Self-pay | Admitting: *Deleted

## 2012-07-14 DIAGNOSIS — I82409 Acute embolism and thrombosis of unspecified deep veins of unspecified lower extremity: Secondary | ICD-10-CM

## 2012-07-14 MED ORDER — PREDNISONE 5 MG PO TABS: 5.0000 mg | ORAL_TABLET | Freq: Every day | ORAL | Status: DC

## 2012-07-26 ENCOUNTER — Encounter (HOSPITAL_COMMUNITY): Payer: Self-pay | Admitting: Emergency Medicine

## 2012-07-26 ENCOUNTER — Observation Stay (HOSPITAL_COMMUNITY)
Admission: EM | Admit: 2012-07-26 | Discharge: 2012-07-27 | Disposition: A | Discharge status: Home / Self Care | Payer: Medicare Other | Attending: Cardiovascular Disease | Admitting: Cardiovascular Disease

## 2012-07-26 ENCOUNTER — Emergency Department (HOSPITAL_COMMUNITY): Payer: Medicare Other

## 2012-07-26 ENCOUNTER — Other Ambulatory Visit: Payer: Self-pay

## 2012-07-26 DIAGNOSIS — E876 Hypokalemia: Secondary | ICD-10-CM | POA: Insufficient documentation

## 2012-07-26 DIAGNOSIS — E039 Hypothyroidism, unspecified: Secondary | ICD-10-CM | POA: Insufficient documentation

## 2012-07-26 DIAGNOSIS — K449 Diaphragmatic hernia without obstruction or gangrene: Secondary | ICD-10-CM | POA: Insufficient documentation

## 2012-07-26 DIAGNOSIS — Z7901 Long term (current) use of anticoagulants: Secondary | ICD-10-CM | POA: Insufficient documentation

## 2012-07-26 DIAGNOSIS — J438 Other emphysema: Secondary | ICD-10-CM | POA: Insufficient documentation

## 2012-07-26 DIAGNOSIS — D729 Disorder of white blood cells, unspecified: Secondary | ICD-10-CM | POA: Diagnosis present

## 2012-07-26 DIAGNOSIS — I309 Acute pericarditis, unspecified: Principal | ICD-10-CM | POA: Insufficient documentation

## 2012-07-26 DIAGNOSIS — I119 Hypertensive heart disease without heart failure: Secondary | ICD-10-CM

## 2012-07-26 DIAGNOSIS — M1990 Unspecified osteoarthritis, unspecified site: Secondary | ICD-10-CM | POA: Insufficient documentation

## 2012-07-26 DIAGNOSIS — I2510 Atherosclerotic heart disease of native coronary artery without angina pectoris: Secondary | ICD-10-CM | POA: Insufficient documentation

## 2012-07-26 DIAGNOSIS — I82409 Acute embolism and thrombosis of unspecified deep veins of unspecified lower extremity: Secondary | ICD-10-CM | POA: Diagnosis present

## 2012-07-26 DIAGNOSIS — I517 Cardiomegaly: Secondary | ICD-10-CM | POA: Insufficient documentation

## 2012-07-26 DIAGNOSIS — I824Z9 Acute embolism and thrombosis of unspecified deep veins of unspecified distal lower extremity: Secondary | ICD-10-CM | POA: Insufficient documentation

## 2012-07-26 DIAGNOSIS — I10 Essential (primary) hypertension: Secondary | ICD-10-CM | POA: Insufficient documentation

## 2012-07-26 DIAGNOSIS — F419 Anxiety disorder, unspecified: Secondary | ICD-10-CM

## 2012-07-26 DIAGNOSIS — R079 Chest pain, unspecified: Secondary | ICD-10-CM

## 2012-07-26 DIAGNOSIS — R072 Precordial pain: Secondary | ICD-10-CM

## 2012-07-26 DIAGNOSIS — D7289 Other specified disorders of white blood cells: Secondary | ICD-10-CM | POA: Insufficient documentation

## 2012-07-26 HISTORY — DX: Long term (current) use of anticoagulants: Z79.01

## 2012-07-26 LAB — PROTIME-INR: Prothrombin Time: 26.8 seconds — ABNORMAL HIGH (ref 11.6–15.2)

## 2012-07-26 LAB — CBC WITH DIFFERENTIAL/PLATELET
HCT: 43.9 % (ref 36.0–46.0)
Hemoglobin: 14.8 g/dL (ref 12.0–15.0)
Lymphocytes Relative: 25 % (ref 12–46)
Lymphs Abs: 3.5 10*3/uL (ref 0.7–4.0)
Monocytes Absolute: 1.4 10*3/uL — ABNORMAL HIGH (ref 0.1–1.0)
Monocytes Relative: 10 % (ref 3–12)
Neutro Abs: 8.8 10*3/uL — ABNORMAL HIGH (ref 1.7–7.7)
Neutrophils Relative %: 64 % (ref 43–77)
RBC: 4.41 MIL/uL (ref 3.87–5.11)
WBC: 13.8 10*3/uL — ABNORMAL HIGH (ref 4.0–10.5)

## 2012-07-26 LAB — BASIC METABOLIC PANEL
BUN: 13 mg/dL (ref 6–23)
CO2: 29 mEq/L (ref 19–32)
Chloride: 104 mEq/L (ref 96–112)
Creatinine, Ser: 0.68 mg/dL (ref 0.50–1.10)
Potassium: 3.4 mEq/L — ABNORMAL LOW (ref 3.5–5.1)

## 2012-07-26 LAB — CK TOTAL AND CKMB (NOT AT ARMC)
CK, MB: 1.9 ng/mL (ref 0.3–4.0)
Relative Index: INVALID (ref 0.0–2.5)
Total CK: 36 U/L (ref 7–177)

## 2012-07-26 LAB — TROPONIN I
Troponin I: <0.3 ng/mL (ref <0.30)
Troponin I: <0.3 ng/mL (ref <0.30)

## 2012-07-26 LAB — TSH: TSH: 3.152 u[IU]/mL (ref 0.350–4.500)

## 2012-07-26 MED ORDER — INFLUENZA VIRUS VACC SPLIT PF IM SUSP
0.5000 mL | INTRAMUSCULAR | Status: DC
  Filled 2012-07-26: qty 0.5

## 2012-07-26 MED ORDER — ONDANSETRON HCL 4 MG/2ML IJ SOLN: 4.0000 mg | Freq: Four times a day (QID) | INTRAMUSCULAR | Status: DC | PRN

## 2012-07-26 MED ORDER — DILTIAZEM HCL ER COATED BEADS 240 MG PO CP24
240.0000 mg | ORAL_CAPSULE | Freq: Every day | ORAL | Status: DC
  Administered 2012-07-26 – 2012-07-27 (×2): 240 mg via ORAL
  Filled 2012-07-26 (×2): qty 1

## 2012-07-26 MED ORDER — BISOPROLOL-HYDROCHLOROTHIAZIDE 5-6.25 MG PO TABS
1.0000 | ORAL_TABLET | Freq: Every day | ORAL | Status: DC
  Administered 2012-07-26 – 2012-07-27 (×2): 1 via ORAL
  Filled 2012-07-26 (×2): qty 1

## 2012-07-26 MED ORDER — WARFARIN - PHARMACIST DOSING INPATIENT: Freq: Every day | Status: DC

## 2012-07-26 MED ORDER — PNEUMOCOCCAL VAC POLYVALENT 25 MCG/0.5ML IJ INJ
0.5000 mL | INJECTION | INTRAMUSCULAR | Status: DC
  Filled 2012-07-26: qty 0.5

## 2012-07-26 MED ORDER — DIAZEPAM 5 MG PO TABS: 5.0000 mg | ORAL_TABLET | Freq: Every day | ORAL | Status: DC | PRN

## 2012-07-26 MED ORDER — COLCHICINE 0.6 MG PO TABS
0.6000 mg | ORAL_TABLET | Freq: Two times a day (BID) | ORAL | Status: DC
  Administered 2012-07-26 – 2012-07-27 (×3): 0.6 mg via ORAL
  Filled 2012-07-26 (×4): qty 1

## 2012-07-26 MED ORDER — ACETAMINOPHEN 325 MG PO TABS: 650.0000 mg | ORAL_TABLET | Freq: Four times a day (QID) | ORAL | Status: DC | PRN

## 2012-07-26 MED ORDER — PREDNISONE 5 MG PO TABS
5.0000 mg | ORAL_TABLET | Freq: Every day | ORAL | Status: DC
  Administered 2012-07-26 – 2012-07-27 (×2): 5 mg via ORAL
  Filled 2012-07-26 (×2): qty 1

## 2012-07-26 MED ORDER — LEVOTHYROXINE SODIUM 50 MCG PO TABS
50.0000 ug | ORAL_TABLET | Freq: Every day | ORAL | Status: DC
  Administered 2012-07-26 – 2012-07-27 (×2): 50 ug via ORAL
  Filled 2012-07-26 (×3): qty 1

## 2012-07-26 MED ORDER — NITROGLYCERIN 0.4 MG SL SUBL: 0.4000 mg | SUBLINGUAL_TABLET | SUBLINGUAL | Status: DC | PRN

## 2012-07-26 MED ORDER — WARFARIN SODIUM 5 MG PO TABS: 5.0000 mg | ORAL_TABLET | ORAL | Status: DC

## 2012-07-26 MED ORDER — POTASSIUM CHLORIDE CRYS ER 20 MEQ PO TBCR: 40.0000 meq | EXTENDED_RELEASE_TABLET | Freq: Once | ORAL | Status: DC

## 2012-07-26 MED ORDER — WARFARIN SODIUM 2.5 MG PO TABS
2.5000 mg | ORAL_TABLET | ORAL | Status: DC
  Administered 2012-07-26: 2.5 mg via ORAL
  Filled 2012-07-26 (×2): qty 1

## 2012-07-26 MED ORDER — IOHEXOL 350 MG/ML SOLN
100.0000 mL | Freq: Once | INTRAVENOUS | Status: AC | PRN
  Administered 2012-07-26: 100 mL via INTRAVENOUS

## 2012-07-26 NOTE — ED Notes (Signed)
Cardiology is at bedside evaluating pt for admission

## 2012-07-26 NOTE — H&P (Signed)
History and Physical  Patient ID: VARONICA BRADBURN MRN: [id number], SOB: 1929/06/02 78 y.o. Date of Encounter: 07/26/2012, 8:35 AM  Primary Physician: Dr. [name] Primary Cardiologist: Dr. [name]  Chief Complaint: chest pain  HPI: 78 y.o. female w/ PMHx significant for HTN, HLD, recurrent DVT (on coumadin), and Hypothyroidism who presented to [facility] on 07/26/2012 with complaints of chest pain.  No prior cardiac history. Reports having a stress test >9yrs ago for unknown reason that she reports as normal. No echo or cath. She was evaluated in clinic by Dr. [name] on 04/04/12 at which time she had no cardiac complaints. She was in her usual state of health when she went to sleep last night. She woke up around 2am with substernal chest pain that radiated to her back and described as someone pounding on her chest. It made it difficult for her to take a deep breath. The pain was relieved with ASA and NTG. She says she can now feel it when she takes a deep breath. No change with position. No associated nausea, diaphoresis, dizziness. Denies any history of chest pain. No recent illness, fever, chills, sob, cough, orthopnea, dysuria, change in bladder/bowel habits, melena/hematochezia/hematuria. She is on chronic coumadin for recurrent DVTs in the past with her last being over 5 yrs ago per her report. She has had BLE pain for weeks for which she had a MRI last weekend and was scheduled to see ortho today. No LE edema. Pain is worse with walking and is in her upper and lower legs. She is on chronic prednisone that was started ~3yrs ago although she is not exactly sure why, but thinks it had something to do with her Thyroid.   EKG shows NSR w/ inferolateral PR depression and <65mm lateral ST elevation. CXR is without acute cardiopulmonary abnormalities. CTA chest is without evidence of PE, but does show coronary calcifications. Labs are significant for normal troponin, WBC 13.8, K+ 3.4,  INR 2.63. She is currently chest pain free unless she takes a deep breath.    Past Medical History  Diagnosis Date  . Hypertension   . Rheumatoid arteritis   . Phlebitis   . Deep vein thrombophlebitis of leg   . Fractured pelvis     fall 2011  . Thyroid disease     hypothyroidism  . Humerus fracture   . Thrombocytopenia   . Leukocytosis   . Hypothyroidism   . Phlebitis      Recurrent phlebitis  . Hematoma      Right superior and inferior pubic rami fractures with hematoma adjacent to the right ramus fracture  . Urinary tract infection   . Humerus fracture      Right proximal humerus fracture  . Osteoarthritis     for which she takes chronic prednisone  . Macular degeneration      Surgical History:  Past Surgical History  Procedure Date  . Left hip open reduction   . Cholecystectomy   . Abdominal hysterectomy s  . Sinus surgery with instatrak      Home Meds: Medication Sig  acetaminophen (TYLENOL) 325 MG tablet Take 650 mg by mouth every 6 (six) hours as needed. For pain  bisoprolol-hydrochlorothiazide (ZIAC) 5-6.25 MG per tablet Take 1 tablet by mouth daily.  diazepam (VALIUM) 5 MG tablet TAKE 1 TABLET BY MOUTH EVERY DAY AS NEEDED FOR ANXIETY  diltiazem (CARDIZEM CD) 240 MG 24 hr capsule Take 1 capsule (240 mg total) by mouth daily.  hydrochlorothiazide (HYDRODIURIL)  12.5 MG tablet Take 1 tablet (12.5 mg total) by mouth daily.  levothyroxine (SYNTHROID, LEVOTHROID) 50 MCG tablet Take 2 tablets twice weekly and 1 tablet 5 days a week  predniSONE (DELTASONE) 5 MG tablet Take 1 tablet (5 mg total) by mouth daily.  warfarin (COUMADIN) 5 MG tablet Take 2.5-5 mg by mouth daily. Take 1 tablet on Monday and Friday then take 1/2 tablet the other days    Allergies:  Allergies  Allergen Reactions  . Cephalexin   . Morphine And Related     nausea  . Penicillins   . Sinutab (Chlorphen-Pseudoephed-Apap)   . Skelaxin   . Tetanus Toxoids   . Zestril (Lisinopril)      History   Social History  . Marital Status: Widowed    Spouse Name: N/A    Number of Children: N/A  . Years of Education: N/A   Occupational History  . Not on file.   Social History Main Topics  . Smoking status: Never Smoker   . Smokeless tobacco: Not on file  . Alcohol Use: No  . Drug Use:   . Sexually Active: No   Other Topics Concern  . Not on file   Social History Narrative  . No narrative on file     Family History: No known coronary disease  Review of Systems: General: negative for chills, fever, night sweats or weight changes.  Cardiovascular: (+) chest pain; negative for  shortness of breath, dyspnea on exertion, edema, orthopnea, palpitations, paroxysmal nocturnal dyspnea  Dermatological: negative for rash Respiratory: negative for cough or wheezing Urologic: negative for hematuria Abdominal: negative for nausea, vomiting, diarrhea, bright red blood per rectum, melena, or hematemesis Neurologic: negative for visual changes, syncope, or dizziness All other systems reviewed and are otherwise negative except as noted above.  Labs:   Component Value Date   WBC 13.8* 07/26/2012   HGB 14.8 07/26/2012   HCT 43.9 07/26/2012   MCV 99.5 07/26/2012   PLT 203 07/26/2012    Lab 07/26/12 0405  NA 143  K 3.4*  CL 104  CO2 29  BUN 13  CREATININE 0.68  CALCIUM 9.9  GLUCOSE 104*   Basename 07/26/12 0407  TROPONINI <0.30     07/26/2012 05:05  Prothrombin Time 26.8 (H)  INR 2.63 (H)    Radiology/Studies:   07/26/2012 - CHEST - 2 VIEW Findings: The lungs appear emphysematous but are clear.  No pneumothorax or pleural fluid. Heart size is normal.  Old right surgical neck fracture of the humerus noted.  IMPRESSION: No acute finding.  The lungs appear emphysematous.     07/26/2012 - CT ANGIOGRAPHY CHEST  Findings: Lung windows demonstrate mild degradation secondary to respiratory motion and patient arm position (not raised above the head).  Moderate  centrilobular emphysema.  Mild atelectasis or scarring at the lung bases.  Soft tissue windows:  The quality of this exam for evaluation of pulmonary embolism is moderate.  The bolus is well timed. Primary limitations of the above motion and patient arm position.  To the lower lobes, no pulmonary embolism to the large segmental level. The upper lobes, no embolism to the lobar level.  Thyroid isthmic nodule which measures 2.2 cm.  Smaller right-sided thyroid nodule.  Bovine arch.  The aorta is normal in caliber.  Limited evaluation for dissection, secondary bolus timing.  Mild cardiomegaly. No pericardial or pleural effusion.  No mediastinal or hilar adenopathy.  A small hiatal hernia.  Multivessel coronary artery atherosclerosis.  Limited abdominal  imaging demonstrates no significant findings. Moderate osteopenia.  IMPRESSION: 1.  Moderate quality evaluation for pulmonary embolism.  No embolism identified, with limitations above. 2.  Cardiomegaly. 3.  Small hiatal hernia. 4.  Moderate centrilobular emphysema. 5.  Coronary artery atherosclerosis.          EKG: 07/26/12 @ 0358 - sinus rhythm 79bpm w/ inferolateral PR depression and <62mm lateral ST elevation  Physical Exam: Blood pressure 140/56, pulse 62, resp. rate 27, SpO2 95.00%. General: Well developed, elderly white female in no acute distress. Head: Normocephalic, atraumatic, sclera non-icteric, nares are without discharge Neck: Supple. Negative for carotid bruits or JVD Lungs: Fine bibasilar rales. No wheezes or rhonchi. Breathing is unlabored. Heart: RRR with S1 S2. 2/6 systolic murmur at LUSB. No rubs, or gallops appreciated. Abdomen: Soft, non-tender, non-distended with normoactive bowel sounds. No rebound/guarding. No obvious abdominal masses. Msk:  Strength and tone appear normal for age. Extremities: No edema. Multiple varicosities, no erythema or palpable cord.  No clubbing or cyanosis. Distal pedal pulses are intact and equal  bilaterally. Neuro: Alert and oriented X 3. Moves all extremities spontaneously. Psych:  Responds to questions appropriately with a normal affect.    ASSESSMENT AND PLAN:  78 y.o. female w/ PMHx significant for HTN, HLD, recurrent DVT (on coumadin), and Hypothyroidism who presented to [facility] on 07/26/2012 with complaints of chest pain.  1. Probable pericarditis 2. Leukocytosis 3. Hypokalemia 4. H/o recurrent DVTs on coumadin 5. Hypertension 6. Hyperlipidemia 7. Hypothyroidism  Patient presents with sudden onset substernal chest pain that has typical and atypical features. No prior cardiac history. No recent illnesses. Pain has a pleuritic component. CTA negative for PE, dissection, aneurysm, infiltrates, or effusions. Therapeutic on coumadin. Afebrile, but with leukocytosis. Initial troponin normal. EKG demonstrates findings consistent with pericarditis. Will treat for suspected pericarditis of unknown etiology. Do not suspect ACS at this time. Will place in observation, monitor on telemetry, cycle cardiac enzymes, obtain echo, check UA & TSH, replete K+, cont coumadin, and treat with colchicine. Note home med list shows Ziac (bisoprolol/HCTZ) and HCTZ. Will cont Ziac and dc HCTZ.   Signed, HOPE, JESSICA PA-C 07/26/2012, 8:35 AM  Patient seen, examined. Available data reviewed. Agree with findings, assessment, and plan as outlined by [facility], PA-C. Patient was independently interviewed and examined. Exam is pertinent for a delightful elderly woman in NAD. Lungs are clear and cardiac exam shows a regular rhythm with grade 2/6 early peaking harsh systolic murmur at the RUSB to neck. EKG is suggestive of pericarditis with subtle diffuse ST elevation and PR depression. Will check a 2D echo and start colchicine. Cycle enzymes to r/o MI, especially considering her advanced age.  Tonny Bollman, [name] 07/26/2012 11:02 AM

## 2012-07-26 NOTE — Progress Notes (Signed)
*  PRELIMINARY RESULTS* Echocardiogram 2D Echocardiogram has been performed.  Marissa Parsons 07/26/2012, 3:06 PM

## 2012-07-26 NOTE — Progress Notes (Signed)
ANTICOAGULATION CONSULT NOTE - Initial Consult  Pharmacy Consult for coumadin Indication: recurrent DVTs  Allergies  Allergen Reactions  . Cephalexin   . Morphine And Related     nausea  . Penicillins   . Sinutab (Chlorphen-Pseudoephed-Apap)   . Skelaxin   . Tetanus Toxoids   . Zestril (Lisinopril)     Patient Measurements: Height: 5\' 7"  (170.2 cm) Weight: 157 lb 10.1 oz (71.5 kg) IBW/kg (Calculated) : 61.6   Vital Signs: Temp: 98.8 F (37.1 C) (10/30 1129) Temp src: Oral (10/30 1129) BP: 145/83 mmHg (10/30 1129) Pulse Rate: 82  (10/30 1129)  Labs:  Basename 07/26/12 1020 07/26/12 0505 07/26/12 0407 07/26/12 0405  HGB -- -- -- 14.8  HCT -- -- -- 43.9  PLT -- -- -- 203  APTT -- -- -- --  LABPROT -- 26.8* -- --  INR -- 2.63* -- --  HEPARINUNFRC -- -- -- --  CREATININE -- -- -- 0.68  CKTOTAL 36 -- -- --  CKMB 1.9 -- -- --  TROPONINI <0.30 -- <0.30 --    Estimated Creatinine Clearance: 53.6 ml/min (by C-G formula based on Cr of 0.68).   Medical History: Past Medical History  Diagnosis Date  . Hypertension   . Rheumatoid arteritis   . Phlebitis   . Deep vein thrombophlebitis of leg   . Fractured pelvis     fall 2011  . Thyroid disease     hypothyroidism  . Humerus fracture   . Thrombocytopenia   . Leukocytosis   . Hypothyroidism   . Phlebitis      Recurrent phlebitis  . Hematoma      Right superior and inferior pubic rami fractures with hematoma adjacent to the right ramus fracture  . Urinary tract infection   . Humerus fracture      Right proximal humerus fracture  . Osteoarthritis     for which she takes chronic prednisone  . Macular degeneration     Medications:  Prescriptions prior to admission  Medication Sig Dispense Refill  . acetaminophen (TYLENOL) 325 MG tablet Take 650 mg by mouth every 6 (six) hours as needed. For pain      . bisoprolol-hydrochlorothiazide (ZIAC) 5-6.25 MG per tablet Take 1 tablet by mouth daily.  90 tablet  3  .  diazepam (VALIUM) 5 MG tablet TAKE 1 TABLET BY MOUTH EVERY DAY AS NEEDED FOR ANXIETY  60 tablet  3  . diltiazem (CARDIZEM CD) 240 MG 24 hr capsule Take 1 capsule (240 mg total) by mouth daily.  90 capsule  3  . hydrochlorothiazide (HYDRODIURIL) 12.5 MG tablet Take 1 tablet (12.5 mg total) by mouth daily.  90 tablet  3  . levothyroxine (SYNTHROID, LEVOTHROID) 50 MCG tablet Take 2 tablets twice weekly and 1 tablet 5 days a week  105 tablet  2  . predniSONE (DELTASONE) 5 MG tablet Take 1 tablet (5 mg total) by mouth daily.  90 tablet  2  . warfarin (COUMADIN) 5 MG tablet Take 2.5-5 mg by mouth daily. Take 1 tablet on Monday and Friday then take 1/2 tablet the other days        Assessment: Patient is an 78 y.o F on coumadin PTA for recurrent DVTs.  Per patient, home dose is 2.5mg  daily except 5mg  on Mondays and Fridays (last dose taken on 10/29). INR is at goal today.  Patient has been on coumadin for some time and is familiar with it.  No additional questions at this time.  Goal of Therapy:  INR 2-3 Monitor platelets by anticoagulation protocol: Yes   Plan:  1) cont home regimen  Circe Chilton P 07/26/2012,11:32 AM

## 2012-07-26 NOTE — ED Notes (Signed)
Pt states was woke with chest pain that radiated to back. No other complaints. States the pain is better, rates as 2/10 on pain scale.

## 2012-07-26 NOTE — ED Notes (Signed)
Pt has arrived in cdu via stretcher. Pt needs to use the restroom.

## 2012-07-26 NOTE — ED Provider Notes (Signed)
History     CSN: [id number]  Arrival date & time 07/26/12  0358   First MD Initiated Contact with Patient 07/26/12 [phone]      Chief Complaint  Patient presents with  . Chest Pain    (Consider location/radiation/quality/duration/timing/severity/associated sxs/prior treatment) Patient is a 78 y.o. female presenting with chest pain.  Chest Pain    78 year old female presents to the emergency apartment via EMS with complaint of chest pain that goes from the center her chest and her back. Patient reports pain started acutely, waking her from sleep. EMS reports upon their arrival, patient was hypoxic with oxygen levels in the 80s. She is not normally O2 dependent. Patient with history of hypertension, remote history of DVT on Coumadin. No prior history of coronary disease. No recent fever cough or chills. No shortness of breath. Patient was at her baseline health when going to bed last night. Patient lives at home, reports she is normally in good health.  She denies any leg swelling, no heartburn symptoms. She has not missed any doses of her Coumadin. She denies history of atrial fibrillation. Patient received nitroglycerin and aspirin in route.  nitroglycerin resolved pain completely. Patient denies any pain currently.   Past Medical History  Diagnosis Date  . Hypertension   . Rheumatoid arteritis   . Phlebitis   . Deep vein thrombophlebitis of leg   . Fractured pelvis     fall 2011  . Thyroid disease     hypothyroidism  . Humerus fracture   . Thrombocytopenia   . Leukocytosis   . Hypothyroidism   . Phlebitis      Recurrent phlebitis  . Hematoma      Right superior and inferior pubic rami fractures with hematoma adjacent to the right ramus fracture  . Urinary tract infection   . Humerus fracture      Right proximal humerus fracture  . Osteoarthritis     for which she takes chronic prednisone  . Macular degeneration     Past Surgical History  Procedure Date  . Left hip  open reduction   . Cholecystectomy   . Abdominal hysterectomy s  . Sinus surgery with instatrak     Family History  Problem Relation Age of Onset  . Abnormal EKG Neg Hx   . Abnormal newborn screen Neg Hx   . Achalasia Neg Hx   . Achondroplasia Neg Hx   . Acne Neg Hx   . Acromegaly Neg Hx   . Actinic keratosis Neg Hx   . Acute lymphoblastic leukemia Neg Hx   . Addison's disease Neg Hx   . Adrenal disorder Neg Hx   . Albinism Neg Hx   . Alcohol abuse Neg Hx   . Alkaptonuria Neg Hx   . Allergic rhinitis Neg Hx   . Allergies Neg Hx   . Allergy (severe) Neg Hx   . Alopecia Neg Hx   . Alpha-1 antitrypsin deficiency Neg Hx   . Alport syndrome Neg Hx   . ALS Neg Hx   . Alzheimer's disease Neg Hx   . Ambiguous genitalia Neg Hx   . Amblyopia Neg Hx   . Amenorrhea Neg Hx   . Anal fissures Neg Hx   . Anemia Neg Hx   . Anesthesia problems Neg Hx   . Aneurysm Neg Hx   . Angelman syndrome Neg Hx   . Angina Neg Hx   . Angioedema Neg Hx   . Ankylosing spondylitis Neg Hx   .  Anorectal malformation Neg Hx   . Anorexia nervosa Neg Hx   . Anti-cardiolipin syndrome Neg Hx   . Antithrombin III deficiency Neg Hx   . Anuerysm Neg Hx   . Anxiety disorder Neg Hx   . Aortic aneurysm Neg Hx   . Aortic dissection Neg Hx   . Aortic stenosis Neg Hx   . Aphthous stomatitis Neg Hx   . Aplastic anemia Neg Hx   . Appendicitis Neg Hx   . Apraxia Neg Hx   . Arnold-Chiari malformation Neg Hx   . Arrhythmia Neg Hx   . Arthritis Neg Hx   . Asperger's syndrome Neg Hx   . Asthma Neg Hx   . Ataxia Neg Hx   . Ataxia telangiectasia Neg Hx   . Atopy Neg Hx   . Atrial fibrillation Neg Hx   . Atrophic kidney Neg Hx   . Auditory processing disorder Neg Hx   . Autism Neg Hx   . Autism spectrum disorder Neg Hx   . Autoimmune disease Neg Hx   . AVM Neg Hx   . Baker's cyst Neg Hx   . Barrett's esophagus Neg Hx   . Bartter's syndrome Neg Hx   . Basal cell carcinoma Neg Hx   . Behavior problems Neg  Hx   . Bell's palsy Neg Hx   . Benign prostatic hyperplasia Neg Hx   . Bipolar disorder Neg Hx   . Birth defects Neg Hx   . Birth marks Neg Hx   . Blindness Neg Hx   . Bone cancer Neg Hx   . BOR syndrome Neg Hx   . Bow legs Neg Hx   . Bradycardia Neg Hx   . Brain cancer Neg Hx   . BRCA 1/2 Neg Hx   . Breast cancer Neg Hx   . Broken bones Neg Hx   . Bronchiolitis Neg Hx   . Bronchopulmonary dysplasia Neg Hx   . Bruton's disease Neg Hx   . Bulemia Neg Hx   . Bullous pemphigoid Neg Hx   . Bunion Neg Hx   . Bursitis Neg Hx   . Caf-au-lait spots Neg Hx   . Calcium disorder Neg Hx   . Canavan disease Neg Hx   . Cancer Neg Hx   . Cardiomyopathy Neg Hx   . Carpal tunnel syndrome Neg Hx   . Cataracts Neg Hx   . Celiac disease Neg Hx   . Cerebral aneurysm Neg Hx   . Cerebral palsy Neg Hx   . Cervical cancer Neg Hx   . Cervical polyp Neg Hx   . Cervicitis Neg Hx   . Chalasia Neg Hx   . Chalazion Neg Hx   . Charcot-Marie-Tooth disease Neg Hx   . Chediak-Higashi syndrome Neg Hx   . Chiari malformation Neg Hx   . Childhood respiratory disease Neg Hx   . Choanal atresia Neg Hx   . Cholecystitis Neg Hx   . Cholelithiasis Neg Hx   . Cholesteatoma Neg Hx   . Chondromalacia Neg Hx   . Chorea Neg Hx   . Chromosomal disorder Neg Hx   . Chronic bronchitis Neg Hx   . Chronic fatigue Neg Hx   . Chronic granulomatous disease Neg Hx   . Chronic infections Neg Hx   . Cirrhosis Neg Hx   . Cleft lip Neg Hx   . Cleft palate Neg Hx   . Clotting disorder Neg Hx   . Club foot Neg  Hx   . Coarctation of the aorta Neg Hx   . Colitis Neg Hx   . Collagen disease Neg Hx   . Colon cancer Neg Hx   . Colon polyps Neg Hx   . Colonic polyp Neg Hx   . Color blindness Neg Hx   . Conduct disorder Neg Hx   . Conductive hearing loss Neg Hx   . Congenital adrenal hyperplasia Neg Hx   . Congenital heart disease Neg Hx   . Conjunctivitis Neg Hx   . Consanguinity Neg Hx   . Constipation Neg Hx   .  COPD Neg Hx   . Corneal abrasion Neg Hx   . Corneal ulcer Neg Hx   . Coronary aneurysm Neg Hx   . Coronary artery disease Neg Hx   . Cowden syndrome Neg Hx   . Craniosynostosis Neg Hx   . Cri-du-chat syndrome Neg Hx   . Crohn's disease Neg Hx   . Cushing syndrome Neg Hx   . Cystic fibrosis Neg Hx   . Cystic kidney disease Neg Hx   . Cystinosis Neg Hx   . Decreased libido Neg Hx   . Deep vein thrombosis Neg Hx   . Delayed menopause Neg Hx   . Delayed puberty Neg Hx   . Dementia Neg Hx   . Dental caries Neg Hx   . Depression Neg Hx   . Dermatomyositis Neg Hx   . DES usage Neg Hx   . Developmental delay Neg Hx   . Diabetes Neg Hx   . Diabetes insipidus Neg Hx   . Diabetes type I Neg Hx   . Diabetes type II Neg Hx   . Diabetic kidney disease Neg Hx   . DiGeorge syndrome Neg Hx   . Dilated cardiomyopathy Neg Hx   . Dislocations Neg Hx   . Diverticulitis Neg Hx   . Diverticulosis Neg Hx   . Down syndrome Neg Hx   . Drug abuse Neg Hx   . Dupuytren's contracture Neg Hx   . Dwarfism Neg Hx   . Dysfunctional uterine bleeding Neg Hx   . Dysmenorrhea Neg Hx   . Dyspareunia Neg Hx   . Dysphagia Neg Hx   . Dysrhythmia Neg Hx   . Dystonia Neg Hx   . Early death Neg Hx   . Early menopause Neg Hx   . Early puberty Neg Hx   . Eating disorder Neg Hx   . Eclampsia Neg Hx   . Ectodermal dysplasia Neg Hx   . Eczema Neg Hx   . Edema Neg Hx   . Edward's syndrome Neg Hx   . Ehlers-Danlos syndrome Neg Hx   . Emotional abuse Neg Hx   . Emphysema Neg Hx   . Encopresis Neg Hx   . Endocrine tumor Neg Hx   . Endocrinopathy Neg Hx   . Endometrial cancer Neg Hx   . Endometriosis Neg Hx   . Enuresis Neg Hx   . Eosinophilic granuloma Neg Hx   . Epididymitis Neg Hx   . Erectile dysfunction Neg Hx   . Erythema nodosum Neg Hx   . Esophageal cancer Neg Hx   . Esophageal varices Neg Hx   . Esophagitis Neg Hx   . Exostosis Neg Hx   . Fabry's disease Neg Hx   . Factor IX deficiency Neg Hx    . Factor V Leiden deficiency Neg Hx   . Factor VIII deficiency Neg Hx   .  Failure to thrive Neg Hx   . Fainting Neg Hx   . Familial dysautonomia Neg Hx   . Familial nephritis Neg Hx   . Familial polyposis Neg Hx   . Fanconi anemia Neg Hx   . Febrile seizures Neg Hx   . Fibrocystic breast disease Neg Hx   . Fibroids Neg Hx   . Fibromyalgia Neg Hx   . Food intolerance Neg Hx   . Fragile X syndrome Neg Hx   . Friedreich's ataxia Neg Hx   . Frontotemporal dementia Neg Hx   . Fuch's dystrophy Neg Hx   . Gait disorder Neg Hx   . Galactosemia Neg Hx   . Gallbladder disease Neg Hx   . Gaucher's disease Neg Hx   . Genodermatoses Neg Hx   . GER disease Neg Hx   . Gestational diabetes Neg Hx   . GI problems Neg Hx   . Glaucoma Neg Hx   . Glomerulonephritis Neg Hx   . Glucose-6-phos deficiency Neg Hx   . Glycogen storage disease Neg Hx   . Goiter Neg Hx   . Gonadal disorder Neg Hx   . Gout Neg Hx   . Graves' disease Neg Hx   . Growth hormone deficiency Neg Hx   . GU problems Neg Hx   . Hammer toes Neg Hx   . Hartnup's disease Neg Hx   . Hashimoto's thyroiditis Neg Hx   . Healthy Neg Hx   . Hearing loss Neg Hx   . Heart block Neg Hx   . Heart defect Neg Hx   . Heart disease Neg Hx   . Heart failure Neg Hx   . Heart murmur Neg Hx   . Hemangiomas Neg Hx   . Hematuria Neg Hx   . Hemochromatosis Neg Hx   . Hemolytic uremic syndrome Neg Hx   . Hemophilia Neg Hx   . Henoch-Schonlein purpura Neg Hx   . Hepatitis Neg Hx   . Hepatomegaly Neg Hx   . Hereditary spherocytosis Neg Hx   . Hernia Neg Hx   . Hiatal hernia Neg Hx   . High arches Neg Hx   . Hip dysplasia Neg Hx   . Hip fracture Neg Hx   . Hirschsprung's disease Neg Hx   . Hirsutism Neg Hx   . Histiocytosis X Neg Hx   . HIV Neg Hx   . HLA-B27 positive Neg Hx   . Hodgkin's lymphoma Neg Hx   . Homocystinuria Neg Hx   . Hunter's disease Neg Hx   . Huntington's disease Neg Hx   . Hydrocele Neg Hx   . Hydrocephalus  Neg Hx   . Hygroma Neg Hx   . Hypercalcemia Neg Hx   . Hypereosinophilic syndrome Neg Hx   . Hyperinsulinemia Neg Hx   . Hyperkalemia Neg Hx   . Hyperlipidemia Neg Hx   . Hypermobility Neg Hx   . Hypernasality Neg Hx   . Hyperopia Neg Hx   . Hyperparathyroidism Neg Hx   . Hypersomnolence Neg Hx   . Hypertension Neg Hx   . Hyperthyroidism Neg Hx   . Hypertrophic cardiomyopathy Neg Hx   . Hypokalemia Neg Hx   . Hypoparathyroidism Neg Hx   . Hypoplastic kidney Neg Hx   . Hypotension Neg Hx   . Hypothyroidism Neg Hx   . Idiopathic pulmonary fibrosis Neg Hx   . Idiopathic torsion dystonia Neg Hx   . IgA nephropathy Neg Hx   .  Immunodeficiency Neg Hx   . Imperforate anus Neg Hx   . Impotence Neg Hx   . Impulse control disorder Neg Hx   . Incompetent cervix Neg Hx   . Infertility Neg Hx   . Inflammatory bowel disease Neg Hx   . Inguinal hernia Neg Hx   . Insomnia Neg Hx   . Insulin resistance Neg Hx   . Interstitial cystitis Neg Hx   . Intestinal malrotation Neg Hx   . Intestinal polyp Neg Hx   . Intracerebral hemorrhage Neg Hx   . Iron deficiency Neg Hx   . Irregular heart beat Neg Hx   . Irritable bowel syndrome Neg Hx   . Jaundice Neg Hx   . Job's syndrome Neg Hx   . Joint hypermobility Neg Hx   . Juvenile idiopathic arthritis Neg Hx   . Kartagener's syndrome Neg Hx   . Kawasaki disease Neg Hx   . Keloids Neg Hx   . Kennedy's disease Neg Hx   . Keratoconus Neg Hx   . Kidney cancer Neg Hx   . Kidney disease Neg Hx   . Kidney failure Neg Hx   . Kidney nephrosis Neg Hx   . Klinefelter's syndrome Neg Hx   . Krabbe disease Neg Hx   . Kyphosis Neg Hx   . Labyrinthitis Neg Hx   . Lactose intolerance Neg Hx   . Language disorder Neg Hx   . Lead poisoning Neg Hx   . Learning disabilities Neg Hx   . Legg-Calve-Perthes disease Neg Hx   . Lesch-Nyhan syndrome Neg Hx   . Leukemia Neg Hx   . Lichen planus Neg Hx   . Li-Fraumeni syndrome Neg Hx   . Liver cancer Neg Hx     . Liver disease Neg Hx   . Long QT syndrome Neg Hx   . Lumbar disc disease Neg Hx   . Lung cancer Neg Hx   . Lung disease Neg Hx   . Lupus Neg Hx   . Lymphoma Neg Hx   . Macrocephaly Neg Hx   . Macrosomia Neg Hx   . Macular degeneration Neg Hx   . Malignant hypertension Neg Hx   . Malignant hyperthermia Neg Hx   . Maple syrup urine disease Neg Hx   . Marfan syndrome Neg Hx   . Mastocytosis Neg Hx   . Melanoma Neg Hx   . Memory loss Neg Hx   . Meniere's disease Neg Hx   . Menstrual problems Neg Hx   . Mental illness Neg Hx   . Mental retardation Neg Hx   . Metabolic syndrome Neg Hx   . Microcephaly Neg Hx   . Migraines Neg Hx   . Milk intolerance Neg Hx   . Miscarriages / Stillbirths Neg Hx   . Mitochondrial disorder Neg Hx   . Mitral valve prolapse Neg Hx   . Motor neuron disease Neg Hx   . Movement disorder Neg Hx   . Moyamoya disease Neg Hx   . Multiple births Neg Hx   . Multiple endocrine neoplasia Neg Hx   . Multiple fractures Neg Hx   . Multiple myeloma Neg Hx   . Multiple sclerosis Neg Hx   . Muscle cancer Neg Hx   . Muscular dystrophy Neg Hx   . Myasthenia gravis Neg Hx   . Myelodysplastic syndrome Neg Hx   . Myocarditis Neg Hx   . Myoclonus Neg Hx   . Myopathy Neg Hx   .  Nail disease Neg Hx   . Narcolepsy Neg Hx   . Nephritis Neg Hx   . Nephrolithiasis Neg Hx   . Nephrotic syndrome Neg Hx   . Neural tube defect Neg Hx   . Neurodegenerative disease Neg Hx   . Neurofibromatosis Neg Hx   . Neuromuscular disorder Neg Hx   . Neuropathy Neg Hx   . Neutropenia Neg Hx   . Nevi Neg Hx   . Niemann-Pick disease Neg Hx   . Night blindness Neg Hx   . Nocturnal enuresis Neg Hx   . Nystagmus Neg Hx   . Obesity Neg Hx   . OCD Neg Hx   . ODD Neg Hx   . Orchitis Neg Hx   . Osler-Weber-Rendu syndrome Neg Hx   . Osteoarthritis Neg Hx   . Osteochondroma Neg Hx   . Osteogenesis imperfecta Neg Hx   . Osteopenia Neg Hx   . Osteoporosis Neg Hx   . Osteosarcoma  Neg Hx   . Osteosclerosis Neg Hx   . Other Neg Hx   . Otitis media Neg Hx   . Ovarian cancer Neg Hx   . Ovarian cysts Neg Hx   . Oxalosis Neg Hx   . Paget's disease of bone Neg Hx   . Pancreatic cancer Neg Hx   . Pancreatitis Neg Hx   . Panhypopituitarism Neg Hx   . Panic disorder Neg Hx   . Paranoid behavior Neg Hx   . Parasomnia Neg Hx   . Parkinsonism Neg Hx   . Patau's syndrome Neg Hx   . Pathological gambling Neg Hx   . PDD Neg Hx   . Pectus carinatum Neg Hx   . Pectus excavatum Neg Hx   . Pelvic inflammatory disease Neg Hx   . Pemphigus vulgaris Neg Hx   . Penile cancer Neg Hx   . Periodic limb movement Neg Hx   . Peripheral vascular disease Neg Hx   . Pernicious anemia Neg Hx   . Personality disorder Neg Hx   . Pes cavus Neg Hx   . Physical abuse Neg Hx   . Otilio Jefferson sequence Neg Hx   . PKU Neg Hx   . Plantar fasciitis Neg Hx   . Pleurisy Neg Hx   . Pneumonia Neg Hx   . Polychondritis Neg Hx   . Polycystic kidney disease Neg Hx   . Polycystic ovary syndrome Neg Hx   . Polycythemia Neg Hx   . Polymyalgia rheumatica Neg Hx   . Polymyositis Neg Hx   . Pompe disease Neg Hx   . Positive PPD/TB Exposure Neg Hx   . Posterior urethral valves Neg Hx   . Post-traumatic stress disorder Neg Hx   . Prader-Willi syndrome Neg Hx   . Premature birth Neg Hx   . Premature ovarian failure Neg Hx   . Preterm labor Neg Hx   . Prolactinoma Neg Hx   . Prostate cancer Neg Hx   . Prostatitis Neg Hx   . Protein C deficiency Neg Hx   . Protein S deficiency Neg Hx   . Proteinuria Neg Hx   . Prune belly syndrome Neg Hx   . Pruritis Neg Hx   . Pseudochol deficiency Neg Hx   . Pseudotumor cerebri Neg Hx   . Psoriasis Neg Hx   . Psychosis Neg Hx   . Ptosis Neg Hx   . Pulmonary embolism Neg Hx   . Pulmonary fibrosis Neg Hx   .  Pyelonephritis Neg Hx   . Pyloric stenosis Neg Hx   . Pyruvate dehydrogenase deficiency Neg Hx   . Rashes / Skin problems Neg Hx   . Raynaud  syndrome Neg Hx   . Rectal cancer Neg Hx   . Recurrent abdominal pain Neg Hx   . Reiter's syndrome Neg Hx   . Renal tubular acidosis Neg Hx   . Restless legs syndrome Neg Hx   . Retinal degeneration Neg Hx   . Retinal detachment Neg Hx   . Retinitis pigmentosa Neg Hx   . Retinoblastoma Neg Hx   . Retinopathy of prematurity Neg Hx   . Reye's syndrome Neg Hx   . Rheum arthritis Neg Hx   . Rheumatic fever Neg Hx   . Rheumatologic disease Neg Hx   . Rickets Neg Hx   . Rosacea Neg Hx   . Sacroiliitis Neg Hx   . Sarcoidosis Neg Hx   . Schizophrenia Neg Hx   . Scleritis Neg Hx   . Scleroderma Neg Hx   . Scoliosis Neg Hx   . Seasonal affective disorder Neg Hx   . Seizures Neg Hx   . Selective mutism Neg Hx   . Sensorineural hearing loss Neg Hx   . Severe combined immunodeficiency Neg Hx   . Severe sprains Neg Hx   . Sexual abuse Neg Hx   . Short stature Neg Hx   . Sick sinus syndrome Neg Hx   . Sickle cell anemia Neg Hx   . Sickle cell trait Neg Hx   . SIDS Neg Hx   . Single kidney Neg Hx   . Sinusitis Neg Hx   . Sjogren's syndrome Neg Hx   . Skeletal dysplasia Neg Hx   . Skin cancer Neg Hx   . Skin telangiectasia Neg Hx   . Sleep apnea Neg Hx   . Sleep disorder Neg Hx   . Sleep walking Neg Hx   . Snoring Neg Hx   . Social phobia Neg Hx   . Speech disorder Neg Hx   . Spina bifida Neg Hx   . Spinal muscular atrophy Neg Hx   . Splenomegaly Neg Hx   . Spondyloarthropathy Neg Hx   . Spondylolisthesis Neg Hx   . Spondylolysis Neg Hx   . Squamous cell carcinoma Neg Hx   . Stevens-Johnson syndrome Neg Hx   . Stickler syndrome Neg Hx   . Stomach cancer Neg Hx   . Strabismus Neg Hx   . Stroke Neg Hx   . Stuttering Neg Hx   . Subarachnoid hemorrhage Neg Hx   . Sudden death Neg Hx   . Suicidality Neg Hx   . Supraventricular tachycardia Neg Hx   . Swallowing difficulties Neg Hx   . Tall stature Neg Hx   . Tay-Sachs disease Neg Hx   . Testicular cancer Neg Hx   .  Thalassemia Neg Hx   . Throat cancer Neg Hx   . Thrombocytopenia Neg Hx   . Thrombophilia Neg Hx   . Thrombophlebitis Neg Hx   . Thrombosis Neg Hx   . Thymic aplasia Neg Hx   . Thyroid cancer Neg Hx   . Thyroid disease Neg Hx   . Thyroid nodules Neg Hx   . Tics Neg Hx   . Tongue cancer Neg Hx   . Torticollis Neg Hx   . Tourette syndrome Neg Hx   . Tracheal cancer Neg Hx   . Tracheoesophageal fistual Neg  Hx   . Tracheomalacia Neg Hx   . Transient ischemic attack Neg Hx   . Treacher Collins syndrome Neg Hx   . Tremor Neg Hx   . Tuberculosis Neg Hx   . Tuberous sclerosis Neg Hx   . Turner syndrome Neg Hx   . Ulcerative colitis Neg Hx   . Ulcers Neg Hx   . Undescended testes Neg Hx   . Unexplained death Neg Hx   . Urinary tract infection Neg Hx   . Urolithiasis Neg Hx   . Urticaria Neg Hx   . Uterine cancer Neg Hx   . Uveitis Neg Hx   . Vaginal cancer Neg Hx   . Valvular heart disease Neg Hx   . Vasculitis Neg Hx   . Velocardiofacial syndrome Neg Hx   . Venous thrombosis Neg Hx   . Verruca vulgaris  Neg Hx   . Vesicoureteral reflux Neg Hx   . Vision loss Neg Hx   . Vitamin D deficiency Neg Hx   . Vitiligo Neg Hx   . Voice disorder Neg Hx   . Von Gierke's disease Neg Hx   . Von Hippel-Lindau syndrome Neg Hx   . Von Willebrand disease Neg Hx   . Werdnig Hoffman Disease Neg Hx   . Williams syndrome Neg Hx   . Wilm's tumor Neg Hx   . Wilson's disease Neg Hx   . Wiskott-Aldrich syndrome Neg Hx   . Evelene Croon Parkinson White syndrome Neg Hx   . Xeroderma pigmentosa Neg Hx   . Heart attack Mother   . Heart attack Father     History  Substance Use Topics  . Smoking status: Never Smoker   . Smokeless tobacco: Not on file  . Alcohol Use: No    OB History    Grav Para Term Preterm Abortions TAB SAB Ect Mult Living                  Review of Systems  Cardiovascular: Positive for chest pain.  All other systems reviewed and are negative.    Allergies  Cephalexin;  Morphine and related; Penicillins; Sinutab; Skelaxin; Tetanus toxoids; and Zestril  Home Medications   Current Outpatient Rx  Name Route Sig Dispense Refill  . ACETAMINOPHEN 325 MG PO TABS Oral Take 650 mg by mouth every 6 (six) hours as needed. For pain    . BISOPROLOL-HYDROCHLOROTHIAZIDE 5-6.25 MG PO TABS Oral Take 1 tablet by mouth daily. 90 tablet 3  . DIAZEPAM 5 MG PO TABS  TAKE 1 TABLET BY MOUTH EVERY DAY AS NEEDED FOR ANXIETY 60 tablet 3  . DILTIAZEM HCL ER COATED BEADS 240 MG PO CP24 Oral Take 1 capsule (240 mg total) by mouth daily. 90 capsule 3  . HYDROCHLOROTHIAZIDE 12.5 MG PO TABS Oral Take 1 tablet (12.5 mg total) by mouth daily. 90 tablet 3  . LEVOTHYROXINE SODIUM 50 MCG PO TABS  Take 2 tablets twice weekly and 1 tablet 5 days a week 105 tablet 2  . PREDNISONE 5 MG PO TABS Oral Take 1 tablet (5 mg total) by mouth daily. 90 tablet 2  . WARFARIN SODIUM 5 MG PO TABS Oral Take 2.5-5 mg by mouth daily. Take 1 tablet on Monday and Friday then take 1/2 tablet the other days      BP 113/61  Pulse 58  Resp 20  SpO2 96%  Physical Exam  Nursing note and vitals reviewed. Constitutional: She is oriented to person, place, and time. She  appears well-developed and well-nourished. No distress.  HENT:  Head: Normocephalic and atraumatic.  Nose: Nose normal.  Mouth/Throat: Oropharynx is clear and moist.  Eyes: Conjunctivae normal and EOM are normal. Pupils are equal, round, and reactive to light.  Neck: Normal range of motion. Neck supple. No JVD present. No tracheal deviation present. No thyromegaly present.  Cardiovascular: Normal rate, regular rhythm, normal heart sounds and intact distal pulses.  Exam reveals no gallop and no friction rub.   No murmur heard. Pulmonary/Chest: Effort normal and breath sounds normal. No stridor. No respiratory distress. She has no wheezes. She has no rales. She exhibits no tenderness.  Abdominal: Soft. Bowel sounds are normal. She exhibits no distension  and no mass. There is no tenderness. There is no rebound and no guarding.  Musculoskeletal: Normal range of motion. She exhibits no edema and no tenderness.  Lymphadenopathy:    She has no cervical adenopathy.  Neurological: She is alert and oriented to person, place, and time. No cranial nerve deficit. She exhibits normal muscle tone. Coordination normal.  Skin: Skin is warm and dry. No rash noted. No erythema. No pallor.  Psychiatric: She has a normal mood and affect. Her behavior is normal. Judgment and thought content normal.    ED Course  Procedures (including critical care time)  Labs Reviewed  CBC WITH DIFFERENTIAL - Abnormal; Notable for the following:    WBC 13.8 (*)     Neutro Abs 8.8 (*)     Monocytes Absolute 1.4 (*)     All other components within normal limits  BASIC METABOLIC PANEL - Abnormal; Notable for the following:    Potassium 3.4 (*)     Glucose, Bld 104 (*)     GFR calc non Af Amer 80 (*)     All other components within normal limits  PROTIME-INR - Abnormal; Notable for the following:    Prothrombin Time 26.8 (*)     INR 2.63 (*)     All other components within normal limits  TROPONIN I   Dg Chest 2 View  07/26/2012  *RADIOLOGY REPORT*  Clinical Data: Chest pain and shortness of breath.  CHEST - 2 VIEW  Comparison: Single view of the chest 06/10/2010.  Findings: The lungs appear emphysematous but are clear.  No pneumothorax or pleural fluid. Heart size is normal.  Old right surgical neck fracture of the humerus noted.  IMPRESSION: No acute finding.  The lungs appear emphysematous.   Original Report Authenticated By: Bernadene Bell. Maricela Curet, [name]    Ct Angio Chest Pe W/cm &/or Wo Cm  07/26/2012  *RADIOLOGY REPORT*  Clinical Data: Chest pain.  Hypoxia.  CT ANGIOGRAPHY CHEST  Technique:  Multidetector CT imaging of the chest using the standard protocol during bolus administration of intravenous contrast. Multiplanar reconstructed images including MIPs were obtained  and reviewed to evaluate the vascular anatomy.  Contrast: OMNIPAQUE IOHEXOL 350 MG/ML SOLN  Comparison: Plain film of 07/26/12.  No prior CT.  Findings: Lung windows demonstrate mild degradation secondary to respiratory motion and patient arm position (not raised above the head).  Moderate centrilobular emphysema.  Mild atelectasis or scarring at the lung bases.  Soft tissue windows:  The quality of this exam for evaluation of pulmonary embolism is moderate.  The bolus is well timed. Primary limitations of the above motion and patient arm position.  To the lower lobes, no pulmonary embolism to the large segmental level. The upper lobes, no embolism to the lobar level.  Thyroid  isthmic nodule which measures 2.2 cm.  Smaller right-sided thyroid nodule.  Bovine arch.  The aorta is normal in caliber.  Limited evaluation for dissection, secondary bolus timing.  Mild cardiomegaly. No pericardial or pleural effusion.  No mediastinal or hilar adenopathy.  A small hiatal hernia.  Multivessel coronary artery atherosclerosis.  Limited abdominal imaging demonstrates no significant findings. Moderate osteopenia.  IMPRESSION: 1.  Moderate quality evaluation for pulmonary embolism.  No embolism identified, with limitations above. 2.  Cardiomegaly. 3.  Small hiatal hernia. 4.  Moderate centrilobular emphysema. 5.  Coronary artery atherosclerosis.   Original Report Authenticated By: Consuello Bossier, [name]     Date: 07/26/2012  Rate: 79  Rhythm: normal sinus rhythm and premature atrial contractions (PAC)  QRS Axis: left  Intervals: normal  ST/T Wave abnormalities: normal  Conduction Disutrbances:none  Narrative Interpretation: pacs new  Old EKG Reviewed: changes noted    1. Chest pain       MDM  78 year old female with new hypoxia, PACs, and chest pain. Patient is therapeutic on her Coumadin, but concern for acute PE causing her symptoms. We'll get CT angioma chest, discuss with Dr. [name] who is both her  cardiologist and primary care doctor        Olivia Mackie, MD 07/26/12 [phone]

## 2012-07-26 NOTE — ED Notes (Signed)
REPORT CALLED TO 2000

## 2012-07-27 ENCOUNTER — Encounter (HOSPITAL_COMMUNITY): Payer: Self-pay | Admitting: Physician Assistant

## 2012-07-27 DIAGNOSIS — E876 Hypokalemia: Secondary | ICD-10-CM | POA: Diagnosis present

## 2012-07-27 DIAGNOSIS — D729 Disorder of white blood cells, unspecified: Secondary | ICD-10-CM | POA: Diagnosis present

## 2012-07-27 DIAGNOSIS — Z7901 Long term (current) use of anticoagulants: Secondary | ICD-10-CM

## 2012-07-27 DIAGNOSIS — I10 Essential (primary) hypertension: Secondary | ICD-10-CM | POA: Insufficient documentation

## 2012-07-27 LAB — BASIC METABOLIC PANEL
CO2: 28 mEq/L (ref 19–32)
Calcium: 10.1 mg/dL (ref 8.4–10.5)
Chloride: 102 mEq/L (ref 96–112)
Sodium: 139 mEq/L (ref 135–145)

## 2012-07-27 LAB — URINALYSIS, ROUTINE W REFLEX MICROSCOPIC
Glucose, UA: NEGATIVE mg/dL
Protein, ur: NEGATIVE mg/dL
Specific Gravity, Urine: 1.011 (ref 1.005–1.030)
pH: 7 (ref 5.0–8.0)

## 2012-07-27 LAB — CBC
MCV: 99.1 fL (ref 78.0–100.0)
Platelets: 186 10*3/uL (ref 150–400)
RBC: 4.49 MIL/uL (ref 3.87–5.11)
WBC: 12.1 10*3/uL — ABNORMAL HIGH (ref 4.0–10.5)

## 2012-07-27 LAB — URINE MICROSCOPIC-ADD ON

## 2012-07-27 MED ORDER — COLCHICINE 0.6 MG PO TABS: 0.6000 mg | ORAL_TABLET | Freq: Two times a day (BID) | ORAL | Status: DC

## 2012-07-27 NOTE — Care Management Note (Signed)
    Page 1 of 1   07/27/2012     4:07:47 PM   CARE MANAGEMENT NOTE 07/27/2012  Patient:  CELINES, FEMIA   Account Number:  [id number]  Date Initiated:  07/27/2012  Documentation initiated by:  Aaiden Depoy  Subjective/Objective Assessment:   PT ADM ON 10/30 WITH PERICARDITIS.  PTA, PT LIVES ALONE AND IS INDEPENDENT.  NEPHEW STAYS WITH HER SOMETIMES AT NIGHT.     Action/Plan:   MET WITH PT TO DISCUSS DC PLANS.  PT STATES NEIGHBOR AND NEPHEW WILL ASSIST AT DC.  SHE DENIES ANY DC NEEDS.   Anticipated DC Date:  07/27/2012   Anticipated DC Plan:  HOME/SELF CARE      DC Planning Services  CM consult      Choice offered to / List presented to:             Status of service:  Completed, signed off Medicare Important Message given?   (If response is "NO", the following Medicare IM given date fields will be blank) Date Medicare IM given:   Date Additional Medicare IM given:    Discharge Disposition:  HOME/SELF CARE  Per UR Regulation:  Reviewed for med. necessity/level of care/duration of stay  If discussed at Long Length of Stay Meetings, dates discussed:    Comments:

## 2012-07-27 NOTE — Discharge Summary (Signed)
CARDIOLOGY DISCHARGE SUMMARY   Patient ID: Marissa Parsons MRN: [id number] DOB/AGE: [birth year] 76 y.o.  Admit date: 07/26/2012 Discharge date: 07/27/2012  Primary Discharge Diagnosis:     Pericarditis, acute  Secondary Discharge Diagnosis:   Hypothyroidism  Deep vein thrombosis (DVT)  Osteoarthritis  Chronic anticoagulation  Hypokalemia  Neutrophilic leukocytosis  Procedures: 2D Echocardiogram  Hospital Course: Marissa Parsons is an 78 year old female with a history of DVT on chronic coumadin who developed chest pain and came to the hospital, where she was admitted for further evaluation and treatment.  Her pain was atypical for angina but was consistent with pericarditis as were her ECG changes. She was started on colchicine. Her cardiac enzymes were cycled and were negative for MI. Her TSH was checked and was within normal limits, so she was continued on her home Synthroid dose. She was continued on her home coumadin dose, as her INR was therapeutic on admission. She had a CTA of the chest (PE protocol) which showed no acute abnormality, full results below. Her white count was elevated with a left shift, but no infectious source was identified, so this is likely secondary to her pericarditis. She has had leukocytosis in the past, felt secondary to steroid use, but no differential is available for review. She was mildly hypokalemic on admission, which was supplemented. Dr [name] evaluated her BP control and her medications. He felt she could be taken off her HCTZ, but continue the Ziac plus her other medications, and maintain adequate blood pressure control. Her potassium can be rechecked as an outpatient.  On 07/27/2012, Marissa Parsons was evaluated by Dr [name]. Her symptoms had improved and her white count was trending down. Her cardiac enzymes were negative for MI. He felt she was improved and considered her stable for discharge, to follow up as an outpatient.  Labs:   Lab Results    Component Value Date   WBC 12.1* 07/27/2012   HGB 15.5* 07/27/2012   HCT 44.5 07/27/2012   MCV 99.1 07/27/2012   PLT 186 07/27/2012     Lab 07/27/12 0512  NA 139  K 3.6  CL 102  CO2 28  BUN 13  CREATININE 0.75  CALCIUM 10.1  PROT --  BILITOT --  ALKPHOS --  ALT --  AST --  GLUCOSE 99    Basename 07/26/12 2147 07/26/12 1550 07/26/12 1020  CKTOTAL 85 60 36  CKMB 3.3 2.7 1.9  CKMBINDEX -- -- --  TROPONINI <0.30 <0.30 <0.30    Basename 07/27/12 0512  INR 2.10*   Lab Results  Component Value Date   TSH 3.152 07/26/2012      Radiology: Dg Chest 2 View 07/26/2012  *RADIOLOGY REPORT*  Clinical Data: Chest pain and shortness of breath.  CHEST - 2 VIEW  Comparison: Single view of the chest 06/10/2010.  Findings: The lungs appear emphysematous but are clear.  No pneumothorax or pleural fluid. Heart size is normal.  Old right surgical neck fracture of the humerus noted.  IMPRESSION: No acute finding.  The lungs appear emphysematous.   Original Report Authenticated By: Bernadene Bell. Maricela Curet, [name]    Ct Angio Chest Pe W/cm &/or Wo Cm 07/26/2012  *RADIOLOGY REPORT*  Clinical Data: Chest pain.  Hypoxia.  CT ANGIOGRAPHY CHEST  Technique:  Multidetector CT imaging of the chest using the standard protocol during bolus administration of intravenous contrast. Multiplanar reconstructed images including MIPs were obtained and reviewed to evaluate the vascular anatomy.  Contrast: OMNIPAQUE IOHEXOL 350  MG/ML SOLN  Comparison: Plain film of 07/26/12.  No prior CT.  Findings: Lung windows demonstrate mild degradation secondary to respiratory motion and patient arm position (not raised above the head).  Moderate centrilobular emphysema.  Mild atelectasis or scarring at the lung bases.  Soft tissue windows:  The quality of this exam for evaluation of pulmonary embolism is moderate.  The bolus is well timed. Primary limitations of the above motion and patient arm position.  To the lower lobes, no  pulmonary embolism to the large segmental level. The upper lobes, no embolism to the lobar level.  Thyroid isthmic nodule which measures 2.2 cm.  Smaller right-sided thyroid nodule.  Bovine arch.  The aorta is normal in caliber.  Limited evaluation for dissection, secondary bolus timing.  Mild cardiomegaly. No pericardial or pleural effusion.  No mediastinal or hilar adenopathy.  A small hiatal hernia.  Multivessel coronary artery atherosclerosis.  Limited abdominal imaging demonstrates no significant findings. Moderate osteopenia.  IMPRESSION: 1.  Moderate quality evaluation for pulmonary embolism.  No embolism identified, with limitations above. 2.  Cardiomegaly. 3.  Small hiatal hernia. 4.  Moderate centrilobular emphysema. 5.  Coronary artery atherosclerosis.   Original Report Authenticated By: Consuello Bossier, [name]    EKG: 26-Jul-2012 03:58:55 [facility]/ED ROUTINE RECORD Atrial fibrillation Left axis deviation Nonspecific T wave abnormality Abnormal ECG 58mm/s 69mm/mV 150Hz  8.0.1 12SL 235 CID: [id number] Referred by: Confirmed By: Darcella Cheshire MD Vent. rate 79 BPM PR interval * Marissa QRS duration 86 Marissa QT/QTc 380/436 Marissa P-R-T axes * 3 44  Echo: 07/26/2012 Study Conclusions - Left ventricle: The cavity size was normal. Systolic function was normal. The estimated ejection fraction was in the range of 60% to 65%. Wall motion was normal; there were no regional wall motion abnormalities. Doppler parameters are consistent with abnormal left ventricular relaxation (grade 1 diastolic dysfunction). - Aortic valve: Valve mobility was severely restricted. There was mild stenosis. Trivial regurgitation. Valve area: 1.48cm^2(VTI). Valve area: 1.39cm^2 (Vmax). - Mitral valve: Calcified annulus. - Left atrium: The atrium was mildly dilated. - Pulmonary arteries: PA peak pressure: 40mm Hg (S). - Impressions: Aortic stenosis is mild by Doppler but looks much more severe  visually. Impressions: - Aortic stenosis is mild by Doppler but looks much more severe visually. There was no pericardial effusion.   FOLLOW UP PLANS AND APPOINTMENTS Allergies  Allergen Reactions  . Cephalexin   . Morphine And Related     nausea  . Penicillins   . Sinutab (Chlorphen-Pseudoephed-Apap)   . Skelaxin   . Tetanus Toxoids   . Zestril (Lisinopril)      Medication List     As of 07/27/2012  1:29 PM    STOP taking these medications         hydrochlorothiazide 12.5 MG tablet   Commonly known as: HYDRODIURIL      TAKE these medications         acetaminophen 325 MG tablet   Commonly known as: TYLENOL   Take 650 mg by mouth every 6 (six) hours as needed. For pain      bisoprolol-hydrochlorothiazide 5-6.25 MG per tablet   Commonly known as: ZIAC   Take 1 tablet by mouth daily.      colchicine 0.6 MG tablet   Take 1 tablet (0.6 mg total) by mouth 2 (two) times daily. Take BID for a week, then QD until Dr [name] sees.      diazepam 5 MG tablet   Commonly known  as: VALIUM   TAKE 1 TABLET BY MOUTH EVERY DAY AS NEEDED FOR ANXIETY      diltiazem 240 MG 24 hr capsule   Commonly known as: CARDIZEM CD   Take 1 capsule (240 mg total) by mouth daily.      levothyroxine 50 MCG tablet   Commonly known as: SYNTHROID, LEVOTHROID   Take 2 tablets twice weekly and 1 tablet 5 days a week      predniSONE 5 MG tablet   Commonly known as: DELTASONE   Take 1 tablet (5 mg total) by mouth daily.      warfarin 5 MG tablet   Commonly known as: COUMADIN   Take 2.5-5 mg by mouth daily. Take 1 tablet on Monday and Friday then take 1/2 tablet the other days          Discharge Orders    Future Appointments: Provider: Department: Dept Phone: Center:   08/07/2012 11:00 AM [name], MD [facility] [phone] [facility]   08/18/2012 2:00 PM [facility] [phone] None     Future Orders Please Complete By Expires    Diet - low sodium heart healthy      Increase activity slowly          BRING ALL MEDICATIONS WITH YOU TO FOLLOW UP APPOINTMENTS  Time spent with patient to include physician time: 35 min Signed: Theodore Demark 07/27/2012, 1:29 PM Co-Sign MD

## 2012-07-27 NOTE — Progress Notes (Signed)
Subjective:  Feels well this am.  No further chest pain. Enzymes negative for MI. Rhythm NSR with PACs.  Objective:  Vital Signs in the last 24 hours: Temp:  [97.2 F (36.2 C)-98.8 F (37.1 C)] 98.4 F (36.9 C) (10/31 0506) Pulse Rate:  [73-99] 73  (10/31 0506) Resp:  [16-26] 20  (10/31 0506) BP: (107-151)/(61-104) 107/61 mmHg (10/31 0506) SpO2:  [95 %-100 %] 98 % (10/31 0506) Weight:  [157 lb 10.1 oz (71.5 kg)] 157 lb 10.1 oz (71.5 kg) (10/30 1124)  Intake/Output from previous day: 10/30 0701 - 10/31 0700 In: 720 [P.O.:720] Out: 1700 [Urine:1700] Intake/Output from this shift:       . bisoprolol-hydrochlorothiazide  1 tablet Oral Daily  . colchicine  0.6 mg Oral BID  . diltiazem  240 mg Oral Daily  . influenza  inactive virus vaccine  0.5 mL Intramuscular Tomorrow-1000  . levothyroxine  50 mcg Oral QAC breakfast  . pneumococcal 23 valent vaccine  0.5 mL Intramuscular Tomorrow-1000  . potassium chloride  40 mEq Oral Once  . predniSONE  5 mg Oral Daily  . warfarin  2.5 mg Oral Custom  . warfarin  5 mg Oral Custom  . Warfarin - Pharmacist Dosing Inpatient   Does not apply q1800      Physical Exam: The patient appears to be in no distress.  Head and neck exam reveals that the pupils are equal and reactive.  The extraocular movements are full.  There is no scleral icterus.  Mouth and pharynx are benign.  No lymphadenopathy.  No carotid bruits.  The jugular venous pressure is normal.  Thyroid is not enlarged or tender.  Chest is clear to percussion and auscultation.  No rales or rhonchi.  Expansion of the chest is symmetrical.  Heart reveals no abnormal lift or heave.  First and second heart sounds are normal.  There is no  gallop rub or click. There is a gr 2/6 systolic murmur at LSE.  No rub. The abdomen is soft and nontender.  Bowel sounds are normoactive.  There is no hepatosplenomegaly or mass.  There are no abdominal bruits.  Extremities reveal no phlebitis or  edema.  Pedal pulses are good.  There is no cyanosis or clubbing.  Neurologic exam is normal strength and no lateralizing weakness.  No sensory deficits.  Integument reveals no rash  Lab Results:  Basename 07/27/12 0512 07/26/12 0405  WBC 12.1* 13.8*  HGB 15.5* 14.8  PLT 186 203    Basename 07/27/12 0512 07/26/12 0405  NA 139 143  K 3.6 3.4*  CL 102 104  CO2 28 29  GLUCOSE 99 104*  BUN 13 13  CREATININE 0.75 0.68    Basename 07/26/12 2147 07/26/12 1550  TROPONINI <0.30 <0.30   Hepatic Function Panel No results found for this basename: PROT,ALBUMIN,AST,ALT,ALKPHOS,BILITOT,BILIDIR,IBILI in the last 72 hours No results found for this basename: CHOL in the last 72 hours No results found for this basename: PROTIME in the last 72 hours  Imaging: Dg Chest 2 View  07/26/2012  *RADIOLOGY REPORT*  Clinical Data: Chest pain and shortness of breath.  CHEST - 2 VIEW  Comparison: Single view of the chest 06/10/2010.  Findings: The lungs appear emphysematous but are clear.  No pneumothorax or pleural fluid. Heart size is normal.  Old right surgical neck fracture of the humerus noted.  IMPRESSION: No acute finding.  The lungs appear emphysematous.   Original Report Authenticated By: Bernadene Bell. Maricela Curet, [name]  Ct Angio Chest Pe W/cm &/or Wo Cm  07/26/2012  *RADIOLOGY REPORT*  Clinical Data: Chest pain.  Hypoxia.  CT ANGIOGRAPHY CHEST  Technique:  Multidetector CT imaging of the chest using the standard protocol during bolus administration of intravenous contrast. Multiplanar reconstructed images including MIPs were obtained and reviewed to evaluate the vascular anatomy.  Contrast: OMNIPAQUE IOHEXOL 350 MG/ML SOLN  Comparison: Plain film of 07/26/12.  No prior CT.  Findings: Lung windows demonstrate mild degradation secondary to respiratory motion and patient arm position (not raised above the head).  Moderate centrilobular emphysema.  Mild atelectasis or scarring at the lung bases.   Soft tissue windows:  The quality of this exam for evaluation of pulmonary embolism is moderate.  The bolus is well timed. Primary limitations of the above motion and patient arm position.  To the lower lobes, no pulmonary embolism to the large segmental level. The upper lobes, no embolism to the lobar level.  Thyroid isthmic nodule which measures 2.2 cm.  Smaller right-sided thyroid nodule.  Bovine arch.  The aorta is normal in caliber.  Limited evaluation for dissection, secondary bolus timing.  Mild cardiomegaly. No pericardial or pleural effusion.  No mediastinal or hilar adenopathy.  A small hiatal hernia.  Multivessel coronary artery atherosclerosis.  Limited abdominal imaging demonstrates no significant findings. Moderate osteopenia.  IMPRESSION: 1.  Moderate quality evaluation for pulmonary embolism.  No embolism identified, with limitations above. 2.  Cardiomegaly. 3.  Small hiatal hernia. 4.  Moderate centrilobular emphysema. 5.  Coronary artery atherosclerosis.   Original Report Authenticated By: Consuello Bossier, [name]     Cardiac Studies: 2D echo shows no pericardial effusion. Mild aortic stenosis. Mild pulmonary hypertension.  Assessment/Plan:  Patient Active Hospital Problem List: Pericarditis, acute (07/26/2012)   Assessment: Presenting symptoms are consistent with mild pericarditis, resolving   Plan: Continue colchicine BID for one week then daily.      Okay for discharge today.  She already has appt to see me Aug 07, 2012.      Continue same meds except stopped HCTZ and continued Ziac.   LOS: 1 day    Marissa Parsons 07/27/2012, 7:35 AM

## 2012-07-27 NOTE — Progress Notes (Signed)
Pt/family given discharge instructions, medication lists, follow up appointments, and when to call the doctor.  Pt/family verbalizes understanding. Orra Nolde McClintock    

## 2012-07-27 NOTE — Progress Notes (Signed)
ANTICOAGULATION CONSULT NOTE - Follow Up Consult  Pharmacy Consult for coumadin Indication: recurrent DVTs  Allergies  Allergen Reactions  . Cephalexin   . Morphine And Related     nausea  . Penicillins   . Sinutab (Chlorphen-Pseudoephed-Apap)   . Skelaxin   . Tetanus Toxoids   . Zestril (Lisinopril)     Patient Measurements: Height: 5\' 7"  (170.2 cm) Weight: 157 lb 10.1 oz (71.5 kg) IBW/kg (Calculated) : 61.6    Vital Signs: Temp: 98.4 F (36.9 C) (10/31 0506) Temp src: Oral (10/31 0506) BP: 107/61 mmHg (10/31 0506) Pulse Rate: 73  (10/31 0506)  Labs:  Basename 07/27/12 0512 07/26/12 2147 07/26/12 1550 07/26/12 1020 07/26/12 0505 07/26/12 0405  HGB 15.5* -- -- -- -- 14.8  HCT 44.5 -- -- -- -- 43.9  PLT 186 -- -- -- -- 203  APTT -- -- -- -- -- --  LABPROT 22.7* -- -- -- 26.8* --  INR 2.10* -- -- -- 2.63* --  HEPARINUNFRC -- -- -- -- -- --  CREATININE 0.75 -- -- -- -- 0.68  CKTOTAL -- 85 60 36 -- --  CKMB -- 3.3 2.7 1.9 -- --  TROPONINI -- <0.30 <0.30 <0.30 -- --    Estimated Creatinine Clearance: 53.6 ml/min (by C-G formula based on Cr of 0.75).   Assessment: Patient is an 78 y.o F on coumadin for hx of recurrent DVTs.  INR is therapeutic but decreased from 2.63 to 2.10 today with dose given on 10/30.  Plan for possible discharge today.   Goal of Therapy:  INR 2-3    Plan:  1) cont home regimen for now and recommend this for discharge 2) f/u with patient in AM if still here   Janiel Derhammer P 07/27/2012,8:32 AM

## 2012-07-28 ENCOUNTER — Telehealth: Payer: Self-pay | Admitting: Cardiology

## 2012-07-28 NOTE — Telephone Encounter (Signed)
Discussed with  Dr. [name] and patient recently started on colchicine which can cause diarrhea.  Will have patient hold for 1 day and then decrease to just 1 tablet daily.  Call back if no better. Patient verbalized understanding

## 2012-07-28 NOTE — Telephone Encounter (Signed)
plz return call to pt, [phone] regarding diarrhea, pt wants demanded triage nurse but did not have any cardiac issues only diarrhea.  Plz return call

## 2012-08-07 ENCOUNTER — Encounter: Payer: Self-pay | Admitting: Cardiology

## 2012-08-07 ENCOUNTER — Ambulatory Visit (INDEPENDENT_AMBULATORY_CARE_PROVIDER_SITE_OTHER): Payer: Medicare Other | Admitting: Cardiology

## 2012-08-07 VITALS — BP 130/68 | HR 75 | Resp 18 | Ht 66.0 in | Wt 149.0 lb

## 2012-08-07 DIAGNOSIS — I119 Hypertensive heart disease without heart failure: Secondary | ICD-10-CM

## 2012-08-07 DIAGNOSIS — I309 Acute pericarditis, unspecified: Secondary | ICD-10-CM

## 2012-08-07 DIAGNOSIS — I491 Atrial premature depolarization: Secondary | ICD-10-CM

## 2012-08-07 DIAGNOSIS — I82409 Acute embolism and thrombosis of unspecified deep veins of unspecified lower extremity: Secondary | ICD-10-CM

## 2012-08-07 NOTE — Progress Notes (Signed)
Marissa Parsons Date of Birth:  1929/02/08 [facility] [address] [place], [place]  [address] [phone]  Fax   [phone]  HPI: This pleasant 78 year old woman is seen for a post hospital office visit. She has a past history of hypertensive cardiovascular disease and a remote history of deep vein recurrent thrombosis. He has hypothyroidism on medication. She also has osteoarthritis.  Since we last saw her she was hospitalized overnight from 07/26/2012 and a discharged on 07/27/2012.  She was hospitalized for chest pain felt to be secondary to possible acute pericarditis.  Her symptoms responded to colchicine.  She did not have any diagnostic EKG changes or audible pericardial rub.  She underwent an echocardiogram on 07/26/12 which showed normal systolic function with an ejection fraction of 60-65% and there was grade 1 diastolic dysfunction.  She has mild aortic stenosis and mild left atrial enlargement interpolar artery pressure was increased at 49 m mercury.  Her EKG on admission was read by the computer as showing atrial fibrillation but actually shows normal sinus rhythm with premature atrial beats. The patient is disabled because of macular degeneration. 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 She is unable to see well enough to cook.   Current Outpatient Prescriptions  Medication Sig Dispense Refill  . acetaminophen (TYLENOL) 325 MG tablet Take 650 mg by mouth every 6 (six) hours as needed. For pain      . bisoprolol-hydrochlorothiazide (ZIAC) 5-6.25 MG per tablet Take 1 tablet by mouth daily.  90 tablet  3  . colchicine 0.6 MG tablet Take 1 tablet (0.6 mg total) by mouth 2 (two) times daily. Take BID for a week, then QD until Dr [name] sees.  60 tablet  0  . diazepam (VALIUM) 5 MG tablet TAKE 1 TABLET BY MOUTH EVERY DAY AS NEEDED FOR ANXIETY  60 tablet  3  . diltiazem (CARDIZEM CD) 240 MG 24 hr capsule Take 1  capsule (240 mg total) by mouth daily.  90 capsule  3  . levothyroxine (SYNTHROID, LEVOTHROID) 50 MCG tablet Take 2 tablets twice weekly and 1 tablet 5 days a week  105 tablet  2  . predniSONE (DELTASONE) 5 MG tablet Take 1 tablet (5 mg total) by mouth daily.  90 tablet  2  . warfarin (COUMADIN) 5 MG tablet Take 2.5-5 mg by mouth daily. Take 1 tablet on Monday and Friday then take 1/2 tablet the other days      . hydrochlorothiazide (HYDRODIURIL) 12.5 MG tablet         Allergies  Allergen Reactions  . Cephalexin   . Morphine And Related     nausea  . Penicillins   . Sinutab (Chlorphen-Pseudoephed-Apap)   . Skelaxin   . Tetanus Toxoids   . Zestril (Lisinopril)     Patient Active Problem List  Diagnosis  . Hypothyroidism  . Osteoporosis  . Macular degeneration  . Deep vein thrombosis (DVT)  . Benign hypertensive heart disease without heart failure  . Osteoarthritis  . Weight loss, unintentional  . Pericarditis, acute  . Chronic anticoagulation  . Hypokalemia  . Neutrophilic leukocytosis  . Hypertension  . PAC (premature atrial contraction)    History  Smoking status  . Never Smoker   Smokeless tobacco  . Not on file    History  Alcohol Use No    Family History  Problem Relation Age of Onset  . Abnormal EKG Neg Hx   .  Abnormal newborn screen Neg Hx   . Achalasia Neg Hx   . Achondroplasia Neg Hx   . Acne Neg Hx   . Acromegaly Neg Hx   . Actinic keratosis Neg Hx   . Acute lymphoblastic leukemia Neg Hx   . Addison's disease Neg Hx   . Adrenal disorder Neg Hx   . Albinism Neg Hx   . Alcohol abuse Neg Hx   . Alkaptonuria Neg Hx   . Allergic rhinitis Neg Hx   . Allergies Neg Hx   . Allergy (severe) Neg Hx   . Alopecia Neg Hx   . Alpha-1 antitrypsin deficiency Neg Hx   . Alport syndrome Neg Hx   . ALS Neg Hx   . Alzheimer's disease Neg Hx   . Ambiguous genitalia Neg Hx   . Amblyopia Neg Hx   . Amenorrhea Neg Hx   . Anal fissures Neg Hx   . Anemia Neg Hx    . Anesthesia problems Neg Hx   . Aneurysm Neg Hx   . Angelman syndrome Neg Hx   . Angina Neg Hx   . Angioedema Neg Hx   . Ankylosing spondylitis Neg Hx   . Anorectal malformation Neg Hx   . Anorexia nervosa Neg Hx   . Anti-cardiolipin syndrome Neg Hx   . Antithrombin III deficiency Neg Hx   . Anuerysm Neg Hx   . Anxiety disorder Neg Hx   . Aortic aneurysm Neg Hx   . Aortic dissection Neg Hx   . Aortic stenosis Neg Hx   . Aphthous stomatitis Neg Hx   . Aplastic anemia Neg Hx   . Appendicitis Neg Hx   . Apraxia Neg Hx   . Arnold-Chiari malformation Neg Hx   . Arrhythmia Neg Hx   . Arthritis Neg Hx   . Asperger's syndrome Neg Hx   . Asthma Neg Hx   . Ataxia Neg Hx   . Ataxia telangiectasia Neg Hx   . Atopy Neg Hx   . Atrial fibrillation Neg Hx   . Atrophic kidney Neg Hx   . Auditory processing disorder Neg Hx   . Autism Neg Hx   . Autism spectrum disorder Neg Hx   . Autoimmune disease Neg Hx   . AVM Neg Hx   . Baker's cyst Neg Hx   . Barrett's esophagus Neg Hx   . Bartter's syndrome Neg Hx   . Basal cell carcinoma Neg Hx   . Behavior problems Neg Hx   . Bell's palsy Neg Hx   . Benign prostatic hyperplasia Neg Hx   . Bipolar disorder Neg Hx   . Birth defects Neg Hx   . Birth marks Neg Hx   . Blindness Neg Hx   . Bone cancer Neg Hx   . BOR syndrome Neg Hx   . Bow legs Neg Hx   . Bradycardia Neg Hx   . Brain cancer Neg Hx   . BRCA 1/2 Neg Hx   . Breast cancer Neg Hx   . Broken bones Neg Hx   . Bronchiolitis Neg Hx   . Bronchopulmonary dysplasia Neg Hx   . Bruton's disease Neg Hx   . Bulemia Neg Hx   . Bullous pemphigoid Neg Hx   . Bunion Neg Hx   . Bursitis Neg Hx   . Caf-au-lait spots Neg Hx   . Calcium disorder Neg Hx   . Canavan disease Neg Hx   . Cancer Neg Hx   .  Cardiomyopathy Neg Hx   . Carpal tunnel syndrome Neg Hx   . Cataracts Neg Hx   . Celiac disease Neg Hx   . Cerebral aneurysm Neg Hx   . Cerebral palsy Neg Hx   . Cervical cancer Neg Hx    . Cervical polyp Neg Hx   . Cervicitis Neg Hx   . Chalasia Neg Hx   . Chalazion Neg Hx   . Charcot-Marie-Tooth disease Neg Hx   . Chediak-Higashi syndrome Neg Hx   . Chiari malformation Neg Hx   . Childhood respiratory disease Neg Hx   . Choanal atresia Neg Hx   . Cholecystitis Neg Hx   . Cholelithiasis Neg Hx   . Cholesteatoma Neg Hx   . Chondromalacia Neg Hx   . Chorea Neg Hx   . Chromosomal disorder Neg Hx   . Chronic bronchitis Neg Hx   . Chronic fatigue Neg Hx   . Chronic granulomatous disease Neg Hx   . Chronic infections Neg Hx   . Cirrhosis Neg Hx   . Cleft lip Neg Hx   . Cleft palate Neg Hx   . Clotting disorder Neg Hx   . Club foot Neg Hx   . Coarctation of the aorta Neg Hx   . Colitis Neg Hx   . Collagen disease Neg Hx   . Colon cancer Neg Hx   . Colon polyps Neg Hx   . Colonic polyp Neg Hx   . Color blindness Neg Hx   . Conduct disorder Neg Hx   . Conductive hearing loss Neg Hx   . Congenital adrenal hyperplasia Neg Hx   . Congenital heart disease Neg Hx   . Conjunctivitis Neg Hx   . Consanguinity Neg Hx   . Constipation Neg Hx   . COPD Neg Hx   . Corneal abrasion Neg Hx   . Corneal ulcer Neg Hx   . Coronary aneurysm Neg Hx   . Coronary artery disease Neg Hx   . Cowden syndrome Neg Hx   . Craniosynostosis Neg Hx   . Cri-du-chat syndrome Neg Hx   . Crohn's disease Neg Hx   . Cushing syndrome Neg Hx   . Cystic fibrosis Neg Hx   . Cystic kidney disease Neg Hx   . Cystinosis Neg Hx   . Decreased libido Neg Hx   . Deep vein thrombosis Neg Hx   . Delayed menopause Neg Hx   . Delayed puberty Neg Hx   . Dementia Neg Hx   . Dental caries Neg Hx   . Depression Neg Hx   . Dermatomyositis Neg Hx   . DES usage Neg Hx   . Developmental delay Neg Hx   . Diabetes Neg Hx   . Diabetes insipidus Neg Hx   . Diabetes type I Neg Hx   . Diabetes type II Neg Hx   . Diabetic kidney disease Neg Hx   . DiGeorge syndrome Neg Hx   . Dilated cardiomyopathy Neg Hx     . Dislocations Neg Hx   . Diverticulitis Neg Hx   . Diverticulosis Neg Hx   . Down syndrome Neg Hx   . Drug abuse Neg Hx   . Dupuytren's contracture Neg Hx   . Dwarfism Neg Hx   . Dysfunctional uterine bleeding Neg Hx   . Dysmenorrhea Neg Hx   . Dyspareunia Neg Hx   . Dysphagia Neg Hx   . Dysrhythmia Neg Hx   . Dystonia Neg Hx   .  Early death Neg Hx   . Early menopause Neg Hx   . Early puberty Neg Hx   . Eating disorder Neg Hx   . Eclampsia Neg Hx   . Ectodermal dysplasia Neg Hx   . Eczema Neg Hx   . Edema Neg Hx   . Edward's syndrome Neg Hx   . Ehlers-Danlos syndrome Neg Hx   . Emotional abuse Neg Hx   . Emphysema Neg Hx   . Encopresis Neg Hx   . Endocrine tumor Neg Hx   . Endocrinopathy Neg Hx   . Endometrial cancer Neg Hx   . Endometriosis Neg Hx   . Enuresis Neg Hx   . Eosinophilic granuloma Neg Hx   . Epididymitis Neg Hx   . Erectile dysfunction Neg Hx   . Erythema nodosum Neg Hx   . Esophageal cancer Neg Hx   . Esophageal varices Neg Hx   . Esophagitis Neg Hx   . Exostosis Neg Hx   . Fabry's disease Neg Hx   . Factor IX deficiency Neg Hx   . Factor V Leiden deficiency Neg Hx   . Factor VIII deficiency Neg Hx   . Failure to thrive Neg Hx   . Fainting Neg Hx   . Familial dysautonomia Neg Hx   . Familial nephritis Neg Hx   . Familial polyposis Neg Hx   . Fanconi anemia Neg Hx   . Febrile seizures Neg Hx   . Fibrocystic breast disease Neg Hx   . Fibroids Neg Hx   . Fibromyalgia Neg Hx   . Food intolerance Neg Hx   . Fragile X syndrome Neg Hx   . Friedreich's ataxia Neg Hx   . Frontotemporal dementia Neg Hx   . Fuch's dystrophy Neg Hx   . Gait disorder Neg Hx   . Galactosemia Neg Hx   . Gallbladder disease Neg Hx   . Gaucher's disease Neg Hx   . Genodermatoses Neg Hx   . GER disease Neg Hx   . Gestational diabetes Neg Hx   . GI problems Neg Hx   . Glaucoma Neg Hx   . Glomerulonephritis Neg Hx   . Glucose-6-phos deficiency Neg Hx   . Glycogen  storage disease Neg Hx   . Goiter Neg Hx   . Gonadal disorder Neg Hx   . Gout Neg Hx   . Graves' disease Neg Hx   . Growth hormone deficiency Neg Hx   . GU problems Neg Hx   . Hammer toes Neg Hx   . Hartnup's disease Neg Hx   . Hashimoto's thyroiditis Neg Hx   . Healthy Neg Hx   . Hearing loss Neg Hx   . Heart block Neg Hx   . Heart defect Neg Hx   . Heart disease Neg Hx   . Heart failure Neg Hx   . Heart murmur Neg Hx   . Hemangiomas Neg Hx   . Hematuria Neg Hx   . Hemochromatosis Neg Hx   . Hemolytic uremic syndrome Neg Hx   . Hemophilia Neg Hx   . Henoch-Schonlein purpura Neg Hx   . Hepatitis Neg Hx   . Hepatomegaly Neg Hx   . Hereditary spherocytosis Neg Hx   . Hernia Neg Hx   . Hiatal hernia Neg Hx   . High arches Neg Hx   . Hip dysplasia Neg Hx   . Hip fracture Neg Hx   . Hirschsprung's disease Neg Hx   . Hirsutism  Neg Hx   . Histiocytosis X Neg Hx   . HIV Neg Hx   . HLA-B27 positive Neg Hx   . Hodgkin's lymphoma Neg Hx   . Homocystinuria Neg Hx   . Hunter's disease Neg Hx   . Huntington's disease Neg Hx   . Hydrocele Neg Hx   . Hydrocephalus Neg Hx   . Hygroma Neg Hx   . Hypercalcemia Neg Hx   . Hypereosinophilic syndrome Neg Hx   . Hyperinsulinemia Neg Hx   . Hyperkalemia Neg Hx   . Hyperlipidemia Neg Hx   . Hypermobility Neg Hx   . Hypernasality Neg Hx   . Hyperopia Neg Hx   . Hyperparathyroidism Neg Hx   . Hypersomnolence Neg Hx   . Hypertension Neg Hx   . Hyperthyroidism Neg Hx   . Hypertrophic cardiomyopathy Neg Hx   . Hypokalemia Neg Hx   . Hypoparathyroidism Neg Hx   . Hypoplastic kidney Neg Hx   . Hypotension Neg Hx   . Hypothyroidism Neg Hx   . Idiopathic pulmonary fibrosis Neg Hx   . Idiopathic torsion dystonia Neg Hx   . IgA nephropathy Neg Hx   . Immunodeficiency Neg Hx   . Imperforate anus Neg Hx   . Impotence Neg Hx   . Impulse control disorder Neg Hx   . Incompetent cervix Neg Hx   . Infertility Neg Hx   . Inflammatory bowel  disease Neg Hx   . Inguinal hernia Neg Hx   . Insomnia Neg Hx   . Insulin resistance Neg Hx   . Interstitial cystitis Neg Hx   . Intestinal malrotation Neg Hx   . Intestinal polyp Neg Hx   . Intracerebral hemorrhage Neg Hx   . Iron deficiency Neg Hx   . Irregular heart beat Neg Hx   . Irritable bowel syndrome Neg Hx   . Jaundice Neg Hx   . Job's syndrome Neg Hx   . Joint hypermobility Neg Hx   . Juvenile idiopathic arthritis Neg Hx   . Kartagener's syndrome Neg Hx   . Kawasaki disease Neg Hx   . Keloids Neg Hx   . Kennedy's disease Neg Hx   . Keratoconus Neg Hx   . Kidney cancer Neg Hx   . Kidney disease Neg Hx   . Kidney failure Neg Hx   . Kidney nephrosis Neg Hx   . Klinefelter's syndrome Neg Hx   . Krabbe disease Neg Hx   . Kyphosis Neg Hx   . Labyrinthitis Neg Hx   . Lactose intolerance Neg Hx   . Language disorder Neg Hx   . Lead poisoning Neg Hx   . Learning disabilities Neg Hx   . Legg-Calve-Perthes disease Neg Hx   . Lesch-Nyhan syndrome Neg Hx   . Leukemia Neg Hx   . Lichen planus Neg Hx   . Li-Fraumeni syndrome Neg Hx   . Liver cancer Neg Hx   . Liver disease Neg Hx   . Long QT syndrome Neg Hx   . Lumbar disc disease Neg Hx   . Lung cancer Neg Hx   . Lung disease Neg Hx   . Lupus Neg Hx   . Lymphoma Neg Hx   . Macrocephaly Neg Hx   . Macrosomia Neg Hx   . Macular degeneration Neg Hx   . Malignant hypertension Neg Hx   . Malignant hyperthermia Neg Hx   . Maple syrup urine disease Neg Hx   . Marfan  syndrome Neg Hx   . Mastocytosis Neg Hx   . Melanoma Neg Hx   . Memory loss Neg Hx   . Meniere's disease Neg Hx   . Menstrual problems Neg Hx   . Mental illness Neg Hx   . Mental retardation Neg Hx   . Metabolic syndrome Neg Hx   . Microcephaly Neg Hx   . Migraines Neg Hx   . Milk intolerance Neg Hx   . Miscarriages / Stillbirths Neg Hx   . Mitochondrial disorder Neg Hx   . Mitral valve prolapse Neg Hx   . Motor neuron disease Neg Hx   . Movement  disorder Neg Hx   . Moyamoya disease Neg Hx   . Multiple births Neg Hx   . Multiple endocrine neoplasia Neg Hx   . Multiple fractures Neg Hx   . Multiple myeloma Neg Hx   . Multiple sclerosis Neg Hx   . Muscle cancer Neg Hx   . Muscular dystrophy Neg Hx   . Myasthenia gravis Neg Hx   . Myelodysplastic syndrome Neg Hx   . Myocarditis Neg Hx   . Myoclonus Neg Hx   . Myopathy Neg Hx   . Nail disease Neg Hx   . Narcolepsy Neg Hx   . Nephritis Neg Hx   . Nephrolithiasis Neg Hx   . Nephrotic syndrome Neg Hx   . Neural tube defect Neg Hx   . Neurodegenerative disease Neg Hx   . Neurofibromatosis Neg Hx   . Neuromuscular disorder Neg Hx   . Neuropathy Neg Hx   . Neutropenia Neg Hx   . Nevi Neg Hx   . Niemann-Pick disease Neg Hx   . Night blindness Neg Hx   . Nocturnal enuresis Neg Hx   . Nystagmus Neg Hx   . Obesity Neg Hx   . OCD Neg Hx   . ODD Neg Hx   . Orchitis Neg Hx   . Osler-Weber-Rendu syndrome Neg Hx   . Osteoarthritis Neg Hx   . Osteochondroma Neg Hx   . Osteogenesis imperfecta Neg Hx   . Osteopenia Neg Hx   . Osteoporosis Neg Hx   . Osteosarcoma Neg Hx   . Osteosclerosis Neg Hx   . Other Neg Hx   . Otitis media Neg Hx   . Ovarian cancer Neg Hx   . Ovarian cysts Neg Hx   . Oxalosis Neg Hx   . Paget's disease of bone Neg Hx   . Pancreatic cancer Neg Hx   . Pancreatitis Neg Hx   . Panhypopituitarism Neg Hx   . Panic disorder Neg Hx   . Paranoid behavior Neg Hx   . Parasomnia Neg Hx   . Parkinsonism Neg Hx   . Patau's syndrome Neg Hx   . Pathological gambling Neg Hx   . PDD Neg Hx   . Pectus carinatum Neg Hx   . Pectus excavatum Neg Hx   . Pelvic inflammatory disease Neg Hx   . Pemphigus vulgaris Neg Hx   . Penile cancer Neg Hx   . Periodic limb movement Neg Hx   . Peripheral vascular disease Neg Hx   . Pernicious anemia Neg Hx   . Personality disorder Neg Hx   . Pes cavus Neg Hx   . Physical abuse Neg Hx   . Otilio Jefferson sequence Neg Hx   . PKU  Neg Hx   . Plantar fasciitis Neg Hx   . Pleurisy Neg Hx   .  Pneumonia Neg Hx   . Polychondritis Neg Hx   . Polycystic kidney disease Neg Hx   . Polycystic ovary syndrome Neg Hx   . Polycythemia Neg Hx   . Polymyalgia rheumatica Neg Hx   . Polymyositis Neg Hx   . Pompe disease Neg Hx   . Positive PPD/TB Exposure Neg Hx   . Posterior urethral valves Neg Hx   . Post-traumatic stress disorder Neg Hx   . Prader-Willi syndrome Neg Hx   . Premature birth Neg Hx   . Premature ovarian failure Neg Hx   . Preterm labor Neg Hx   . Prolactinoma Neg Hx   . Prostate cancer Neg Hx   . Prostatitis Neg Hx   . Protein C deficiency Neg Hx   . Protein S deficiency Neg Hx   . Proteinuria Neg Hx   . Prune belly syndrome Neg Hx   . Pruritis Neg Hx   . Pseudochol deficiency Neg Hx   . Pseudotumor cerebri Neg Hx   . Psoriasis Neg Hx   . Psychosis Neg Hx   . Ptosis Neg Hx   . Pulmonary embolism Neg Hx   . Pulmonary fibrosis Neg Hx   . Pyelonephritis Neg Hx   . Pyloric stenosis Neg Hx   . Pyruvate dehydrogenase deficiency Neg Hx   . Rashes / Skin problems Neg Hx   . Raynaud syndrome Neg Hx   . Rectal cancer Neg Hx   . Recurrent abdominal pain Neg Hx   . Reiter's syndrome Neg Hx   . Renal tubular acidosis Neg Hx   . Restless legs syndrome Neg Hx   . Retinal degeneration Neg Hx   . Retinal detachment Neg Hx   . Retinitis pigmentosa Neg Hx   . Retinoblastoma Neg Hx   . Retinopathy of prematurity Neg Hx   . Reye's syndrome Neg Hx   . Rheum arthritis Neg Hx   . Rheumatic fever Neg Hx   . Rheumatologic disease Neg Hx   . Rickets Neg Hx   . Rosacea Neg Hx   . Sacroiliitis Neg Hx   . Sarcoidosis Neg Hx   . Schizophrenia Neg Hx   . Scleritis Neg Hx   . Scleroderma Neg Hx   . Scoliosis Neg Hx   . Seasonal affective disorder Neg Hx   . Seizures Neg Hx   . Selective mutism Neg Hx   . Sensorineural hearing loss Neg Hx   . Severe combined immunodeficiency Neg Hx   . Severe sprains Neg Hx   .  Sexual abuse Neg Hx   . Short stature Neg Hx   . Sick sinus syndrome Neg Hx   . Sickle cell anemia Neg Hx   . Sickle cell trait Neg Hx   . SIDS Neg Hx   . Single kidney Neg Hx   . Sinusitis Neg Hx   . Sjogren's syndrome Neg Hx   . Skeletal dysplasia Neg Hx   . Skin cancer Neg Hx   . Skin telangiectasia Neg Hx   . Sleep apnea Neg Hx   . Sleep disorder Neg Hx   . Sleep walking Neg Hx   . Snoring Neg Hx   . Social phobia Neg Hx   . Speech disorder Neg Hx   . Spina bifida Neg Hx   . Spinal muscular atrophy Neg Hx   . Splenomegaly Neg Hx   . Spondyloarthropathy Neg Hx   . Spondylolisthesis Neg Hx   . Spondylolysis Neg  Hx   . Squamous cell carcinoma Neg Hx   . Stevens-Johnson syndrome Neg Hx   . Stickler syndrome Neg Hx   . Stomach cancer Neg Hx   . Strabismus Neg Hx   . Stroke Neg Hx   . Stuttering Neg Hx   . Subarachnoid hemorrhage Neg Hx   . Sudden death Neg Hx   . Suicidality Neg Hx   . Supraventricular tachycardia Neg Hx   . Swallowing difficulties Neg Hx   . Tall stature Neg Hx   . Tay-Sachs disease Neg Hx   . Testicular cancer Neg Hx   . Thalassemia Neg Hx   . Throat cancer Neg Hx   . Thrombocytopenia Neg Hx   . Thrombophilia Neg Hx   . Thrombophlebitis Neg Hx   . Thrombosis Neg Hx   . Thymic aplasia Neg Hx   . Thyroid cancer Neg Hx   . Thyroid disease Neg Hx   . Thyroid nodules Neg Hx   . Tics Neg Hx   . Tongue cancer Neg Hx   . Torticollis Neg Hx   . Tourette syndrome Neg Hx   . Tracheal cancer Neg Hx   . Tracheoesophageal fistual Neg Hx   . Tracheomalacia Neg Hx   . Transient ischemic attack Neg Hx   . Treacher Collins syndrome Neg Hx   . Tremor Neg Hx   . Tuberculosis Neg Hx   . Tuberous sclerosis Neg Hx   . Turner syndrome Neg Hx   . Ulcerative colitis Neg Hx   . Ulcers Neg Hx   . Undescended testes Neg Hx   . Unexplained death Neg Hx   . Urinary tract infection Neg Hx   . Urolithiasis Neg Hx   . Urticaria Neg Hx   . Uterine cancer Neg Hx     . Uveitis Neg Hx   . Vaginal cancer Neg Hx   . Valvular heart disease Neg Hx   . Vasculitis Neg Hx   . Velocardiofacial syndrome Neg Hx   . Venous thrombosis Neg Hx   . Verruca vulgaris  Neg Hx   . Vesicoureteral reflux Neg Hx   . Vision loss Neg Hx   . Vitamin D deficiency Neg Hx   . Vitiligo Neg Hx   . Voice disorder Neg Hx   . Von Gierke's disease Neg Hx   . Von Hippel-Lindau syndrome Neg Hx   . Von Willebrand disease Neg Hx   . Werdnig Hoffman Disease Neg Hx   . Williams syndrome Neg Hx   . Wilm's tumor Neg Hx   . Wilson's disease Neg Hx   . Wiskott-Aldrich syndrome Neg Hx   . Evelene Croon Parkinson White syndrome Neg Hx   . Xeroderma pigmentosa Neg Hx   . Heart attack Mother   . Heart attack Father     Review of Systems: The patient denies any heat or cold intolerance.  No weight gain or weight loss.  The patient denies headaches or blurry vision.  There is no cough or sputum production.  The patient denies dizziness.  There is no hematuria or hematochezia.  The patient denies any muscle aches or arthritis.  The patient denies any rash.  The patient denies frequent falling or instability.  There is no history of depression or anxiety.  All other systems were reviewed and are negative.   Physical Exam: Filed Vitals:   08/07/12 1115  BP: 130/68  Pulse: 75  Resp: 18   the general appearance reveals  a elderly woman in no acute distress.  Her vision is very poor.The head and neck exam reveals pupils equal and reactive.  Extraocular movements are full.  There is no scleral icterus.  The mouth and pharynx are normal.  The neck is supple.  The carotids reveal no bruits.  The jugular venous pressure is normal.  The  thyroid is not enlarged.  There is no lymphadenopathy.  The chest is clear to percussion and auscultation.  There are no rales or rhonchi.  Expansion of the chest is symmetrical.  The precordium is quiet.  The first heart sound is normal.  The second heart sound is  physiologically split.  There is no murmur gallop rub or click.  There is no abnormal lift or heave.  The abdomen is soft and nontender.  The bowel sounds are normal.  The liver and spleen are not enlarged.  There are no abdominal masses.  There are no abdominal bruits.  Extremities reveal good pedal pulses.  There is no phlebitis or edema.  There is no cyanosis or clubbing.  Strength is normal and symmetrical in all extremities.  There is no lateralizing weakness.  There are no sensory deficits.  The skin is warm and dry.  There is no rash.      Assessment / Plan: There is no pericardial rub heard today.  She will continue on colchicine once a day which she is tolerating well and which may help prevent a recurrence of the pericarditis.  Recheck in 3 months for followup office visit and EKG.  She will be seeing her orthopedist today about her problems with her legs.

## 2012-08-07 NOTE — Assessment & Plan Note (Signed)
Blood pressure has been remaining stable on current therapy.  No dizziness or syncope. 

## 2012-08-07 NOTE — Assessment & Plan Note (Signed)
The patient has had no recurrent chest pain to suggest recurrent pericarditis.  She initially was on colchicine twice a day but it caused diarrhea.  However she is tolerating colchicine once a day without side effects.  Her bowel habits are normal with 1 normal bowel movement daily

## 2012-08-07 NOTE — Assessment & Plan Note (Signed)
The patient has a history of deep vein thrombosis.  She is on long-term Coumadin.  She has had no recent episodes of DVT

## 2012-08-07 NOTE — Patient Instructions (Addendum)
Your physician recommends that you continue on your current medications as directed. Please refer to the Current Medication list given to you today.  Your physician recommends that you schedule a follow-up appointment in: 3 months  

## 2012-08-18 ENCOUNTER — Ambulatory Visit (INDEPENDENT_AMBULATORY_CARE_PROVIDER_SITE_OTHER): Payer: Medicare Other | Admitting: *Deleted

## 2012-08-18 DIAGNOSIS — I82409 Acute embolism and thrombosis of unspecified deep veins of unspecified lower extremity: Secondary | ICD-10-CM

## 2012-08-18 LAB — POCT INR: INR: 2.5

## 2012-08-30 ENCOUNTER — Telehealth: Payer: Self-pay | Admitting: Cardiology

## 2012-08-30 NOTE — Telephone Encounter (Signed)
Back procedure and they are recommending holding Warfarin 5 days prior to procedure. Will forward to  Dr. [name] for review

## 2012-08-30 NOTE — Telephone Encounter (Signed)
Left message to call back  

## 2012-08-30 NOTE — Telephone Encounter (Signed)
Okay to hold 5 days.  No bridging needed.

## 2012-08-30 NOTE — Telephone Encounter (Signed)
F/u   Patient returning nurse call, she can be reached at [phone]

## 2012-08-30 NOTE — Telephone Encounter (Signed)
New problem:   Need to come off coumadin  - upcoming procedure .

## 2012-08-30 NOTE — Telephone Encounter (Signed)
Advised patient

## 2012-09-13 ENCOUNTER — Ambulatory Visit (INDEPENDENT_AMBULATORY_CARE_PROVIDER_SITE_OTHER): Payer: Medicare Other | Admitting: *Deleted

## 2012-09-13 DIAGNOSIS — I82409 Acute embolism and thrombosis of unspecified deep veins of unspecified lower extremity: Secondary | ICD-10-CM

## 2012-09-18 ENCOUNTER — Other Ambulatory Visit: Payer: Self-pay | Admitting: *Deleted

## 2012-09-18 MED ORDER — WARFARIN SODIUM 5 MG PO TABS: 5.0000 mg | ORAL_TABLET | ORAL | Status: DC

## 2012-09-25 ENCOUNTER — Ambulatory Visit (INDEPENDENT_AMBULATORY_CARE_PROVIDER_SITE_OTHER): Payer: Medicare Other | Admitting: Pharmacist

## 2012-09-25 DIAGNOSIS — I82409 Acute embolism and thrombosis of unspecified deep veins of unspecified lower extremity: Secondary | ICD-10-CM

## 2012-09-25 LAB — POCT INR: INR: 2.4

## 2012-10-16 ENCOUNTER — Ambulatory Visit (INDEPENDENT_AMBULATORY_CARE_PROVIDER_SITE_OTHER): Payer: Medicare Other | Admitting: Pharmacist

## 2012-10-16 DIAGNOSIS — I82409 Acute embolism and thrombosis of unspecified deep veins of unspecified lower extremity: Secondary | ICD-10-CM

## 2012-11-08 ENCOUNTER — Other Ambulatory Visit: Payer: Self-pay | Admitting: *Deleted

## 2012-11-08 MED ORDER — BISOPROLOL-HYDROCHLOROTHIAZIDE 5-6.25 MG PO TABS: 1.0000 | ORAL_TABLET | Freq: Every day | ORAL | Status: DC

## 2012-11-08 MED ORDER — DILTIAZEM HCL ER COATED BEADS 240 MG PO CP24: 240.0000 mg | ORAL_CAPSULE | Freq: Every day | ORAL | Status: DC

## 2012-11-16 ENCOUNTER — Ambulatory Visit (INDEPENDENT_AMBULATORY_CARE_PROVIDER_SITE_OTHER): Payer: Medicare Other | Admitting: Cardiology

## 2012-11-16 ENCOUNTER — Ambulatory Visit (INDEPENDENT_AMBULATORY_CARE_PROVIDER_SITE_OTHER): Payer: Medicare Other | Admitting: *Deleted

## 2012-11-16 VITALS — BP 118/60 | HR 62 | Ht 67.0 in | Wt 148.8 lb

## 2012-11-16 DIAGNOSIS — F419 Anxiety disorder, unspecified: Secondary | ICD-10-CM

## 2012-11-16 DIAGNOSIS — I119 Hypertensive heart disease without heart failure: Secondary | ICD-10-CM

## 2012-11-16 LAB — POCT INR: INR: 3.8

## 2012-11-16 MED ORDER — DIAZEPAM 5 MG PO TABS: 5.0000 mg | ORAL_TABLET | Freq: Every day | ORAL | Status: DC | PRN

## 2012-11-16 NOTE — Assessment & Plan Note (Signed)
The patient has a history of recurrent deep thrombosis and is on lifelong Coumadin.  She is not having any problems or side effects from the Coumadin.

## 2012-11-16 NOTE — Patient Instructions (Addendum)
Your physician recommends that you continue on your current medications as directed. Please refer to the Current Medication list given to you today.  Your physician recommends that you schedule a follow-up appointment in: 4 months  

## 2012-11-16 NOTE — Progress Notes (Signed)
Marissa Parsons Date of Birth:  07-27-1929 [facility] [address] [place], [place]  [address] [phone]  Fax   [phone]  HPI: This pleasant 79 year old woman is seen for a scheduled followup office visit. She has a past history of hypertensive cardiovascular disease and a remote history of deep vein recurrent thrombosis. He has hypothyroidism on medication. She also has osteoarthritis. Since we last saw her she was hospitalized overnight from 07/26/2012 and a discharged on 07/27/2012. She was hospitalized for chest pain felt to be secondary to possible acute pericarditis. Her symptoms responded to colchicine. She did not have any diagnostic EKG changes or audible pericardial rub. She underwent an echocardiogram on 07/26/12 which showed normal systolic function with an ejection fraction of 60-65% and there was grade 1 diastolic dysfunction. She has mild aortic stenosis and mild left atrial enlargement interpolar artery pressure was increased at 49 m mercury. Her EKG on admission was read by the computer as showing atrial fibrillation but actually shows normal sinus rhythm with premature atrial beats.  The patient is disabled because of macular degeneration. 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 She is unable to see well enough to cook.    Current Outpatient Prescriptions  Medication Sig Dispense Refill  . acetaminophen (TYLENOL) 325 MG tablet Take 650 mg by mouth every 6 (six) hours as needed. For pain      . bisoprolol-hydrochlorothiazide (ZIAC) 5-6.25 MG per tablet Take 1 tablet by mouth daily.  90 tablet  3  . diazepam (VALIUM) 5 MG tablet Take 1 tablet (5 mg total) by mouth daily as needed.  60 tablet  2  . diltiazem (CARDIZEM CD) 240 MG 24 hr capsule Take 1 capsule (240 mg total) by mouth daily.  90 capsule  3  . hydrochlorothiazide (HYDRODIURIL) 12.5 MG tablet       . levothyroxine (SYNTHROID, LEVOTHROID) 50  MCG tablet Take 2 tablets twice weekly and 1 tablet 5 days a week  105 tablet  2  . predniSONE (DELTASONE) 5 MG tablet Take 1 tablet (5 mg total) by mouth daily.  90 tablet  2  . warfarin (COUMADIN) 5 MG tablet Take 1 tablet (5 mg total) by mouth as directed. Take As Directed per Anticoagulation Clinic  90 tablet  0  . colchicine 0.6 MG tablet Take 1 tablet (0.6 mg total) by mouth 2 (two) times daily. Take BID for a week, then QD until Dr [name] sees.  60 tablet  0   No current facility-administered medications for this visit.    Allergies  Allergen Reactions  . Cephalexin   . Morphine And Related     nausea  . Penicillins   . Sinutab (Chlorphen-Pseudoephed-Apap)   . Skelaxin   . Tetanus Toxoids   . Zestril (Lisinopril)     Patient Active Problem List  Diagnosis  . Hypothyroidism  . Osteoporosis  . Macular degeneration  . Deep vein thrombosis (DVT)  . Benign hypertensive heart disease without heart failure  . Osteoarthritis  . Weight loss, unintentional  . Pericarditis, acute  . Chronic anticoagulation  . Hypokalemia  . Neutrophilic leukocytosis  . Hypertension  . PAC (premature atrial contraction)    History  Smoking status  . Never Smoker   Smokeless tobacco  . Not on file    History  Alcohol Use No    Family History  Problem Relation Age of Onset  . Abnormal EKG Neg Hx   .  Abnormal newborn screen Neg Hx   . Achalasia Neg Hx   . Achondroplasia Neg Hx   . Acne Neg Hx   . Acromegaly Neg Hx   . Actinic keratosis Neg Hx   . Acute lymphoblastic leukemia Neg Hx   . Addison's disease Neg Hx   . Adrenal disorder Neg Hx   . Albinism Neg Hx   . Alcohol abuse Neg Hx   . Alkaptonuria Neg Hx   . Allergic rhinitis Neg Hx   . Allergies Neg Hx   . Allergy (severe) Neg Hx   . Alopecia Neg Hx   . Alpha-1 antitrypsin deficiency Neg Hx   . Alport syndrome Neg Hx   . ALS Neg Hx   . Alzheimer's disease Neg Hx   . Ambiguous genitalia Neg Hx   . Amblyopia Neg Hx     . Amenorrhea Neg Hx   . Anal fissures Neg Hx   . Anemia Neg Hx   . Anesthesia problems Neg Hx   . Aneurysm Neg Hx   . Angelman syndrome Neg Hx   . Angina Neg Hx   . Angioedema Neg Hx   . Ankylosing spondylitis Neg Hx   . Anorectal malformation Neg Hx   . Anorexia nervosa Neg Hx   . Anti-cardiolipin syndrome Neg Hx   . Antithrombin III deficiency Neg Hx   . Anuerysm Neg Hx   . Anxiety disorder Neg Hx   . Aortic aneurysm Neg Hx   . Aortic dissection Neg Hx   . Aortic stenosis Neg Hx   . Aphthous stomatitis Neg Hx   . Aplastic anemia Neg Hx   . Appendicitis Neg Hx   . Apraxia Neg Hx   . Arnold-Chiari malformation Neg Hx   . Arrhythmia Neg Hx   . Arthritis Neg Hx   . Asperger's syndrome Neg Hx   . Asthma Neg Hx   . Ataxia Neg Hx   . Ataxia telangiectasia Neg Hx   . Atopy Neg Hx   . Atrial fibrillation Neg Hx   . Atrophic kidney Neg Hx   . Auditory processing disorder Neg Hx   . Autism Neg Hx   . Autism spectrum disorder Neg Hx   . Autoimmune disease Neg Hx   . AVM Neg Hx   . Baker's cyst Neg Hx   . Barrett's esophagus Neg Hx   . Bartter's syndrome Neg Hx   . Basal cell carcinoma Neg Hx   . Behavior problems Neg Hx   . Bell's palsy Neg Hx   . Benign prostatic hyperplasia Neg Hx   . Bipolar disorder Neg Hx   . Birth defects Neg Hx   . Birth marks Neg Hx   . Blindness Neg Hx   . Bone cancer Neg Hx   . BOR syndrome Neg Hx   . Bow legs Neg Hx   . Bradycardia Neg Hx   . Brain cancer Neg Hx   . BRCA 1/2 Neg Hx   . Breast cancer Neg Hx   . Broken bones Neg Hx   . Bronchiolitis Neg Hx   . Bronchopulmonary dysplasia Neg Hx   . Bruton's disease Neg Hx   . Bulemia Neg Hx   . Bullous pemphigoid Neg Hx   . Bunion Neg Hx   . Bursitis Neg Hx   . Caf-au-lait spots Neg Hx   . Calcium disorder Neg Hx   . Canavan disease Neg Hx   . Cancer Neg Hx   .  Cardiomyopathy Neg Hx   . Carpal tunnel syndrome Neg Hx   . Cataracts Neg Hx   . Celiac disease Neg Hx   . Cerebral  aneurysm Neg Hx   . Cerebral palsy Neg Hx   . Cervical cancer Neg Hx   . Cervical polyp Neg Hx   . Cervicitis Neg Hx   . Chalasia Neg Hx   . Chalazion Neg Hx   . Charcot-Marie-Tooth disease Neg Hx   . Chediak-Higashi syndrome Neg Hx   . Chiari malformation Neg Hx   . Childhood respiratory disease Neg Hx   . Choanal atresia Neg Hx   . Cholecystitis Neg Hx   . Cholelithiasis Neg Hx   . Cholesteatoma Neg Hx   . Chondromalacia Neg Hx   . Chorea Neg Hx   . Chromosomal disorder Neg Hx   . Chronic bronchitis Neg Hx   . Chronic fatigue Neg Hx   . Chronic granulomatous disease Neg Hx   . Chronic infections Neg Hx   . Cirrhosis Neg Hx   . Cleft lip Neg Hx   . Cleft palate Neg Hx   . Clotting disorder Neg Hx   . Club foot Neg Hx   . Coarctation of the aorta Neg Hx   . Colitis Neg Hx   . Collagen disease Neg Hx   . Colon cancer Neg Hx   . Colon polyps Neg Hx   . Colonic polyp Neg Hx   . Color blindness Neg Hx   . Conduct disorder Neg Hx   . Conductive hearing loss Neg Hx   . Congenital adrenal hyperplasia Neg Hx   . Congenital heart disease Neg Hx   . Conjunctivitis Neg Hx   . Consanguinity Neg Hx   . Constipation Neg Hx   . COPD Neg Hx   . Corneal abrasion Neg Hx   . Corneal ulcer Neg Hx   . Coronary aneurysm Neg Hx   . Coronary artery disease Neg Hx   . Cowden syndrome Neg Hx   . Craniosynostosis Neg Hx   . Cri-du-chat syndrome Neg Hx   . Crohn's disease Neg Hx   . Cushing syndrome Neg Hx   . Cystic fibrosis Neg Hx   . Cystic kidney disease Neg Hx   . Cystinosis Neg Hx   . Decreased libido Neg Hx   . Deep vein thrombosis Neg Hx   . Delayed menopause Neg Hx   . Delayed puberty Neg Hx   . Dementia Neg Hx   . Dental caries Neg Hx   . Depression Neg Hx   . Dermatomyositis Neg Hx   . DES usage Neg Hx   . Developmental delay Neg Hx   . Diabetes Neg Hx   . Diabetes insipidus Neg Hx   . Diabetes type I Neg Hx   . Diabetes type II Neg Hx   . Diabetic kidney disease  Neg Hx   . DiGeorge syndrome Neg Hx   . Dilated cardiomyopathy Neg Hx   . Dislocations Neg Hx   . Diverticulitis Neg Hx   . Diverticulosis Neg Hx   . Down syndrome Neg Hx   . Drug abuse Neg Hx   . Dupuytren's contracture Neg Hx   . Dwarfism Neg Hx   . Dysfunctional uterine bleeding Neg Hx   . Dysmenorrhea Neg Hx   . Dyspareunia Neg Hx   . Dysphagia Neg Hx   . Dysrhythmia Neg Hx   . Dystonia Neg Hx   .  Early death Neg Hx   . Early menopause Neg Hx   . Early puberty Neg Hx   . Eating disorder Neg Hx   . Eclampsia Neg Hx   . Ectodermal dysplasia Neg Hx   . Eczema Neg Hx   . Edema Neg Hx   . Edward's syndrome Neg Hx   . Ehlers-Danlos syndrome Neg Hx   . Emotional abuse Neg Hx   . Emphysema Neg Hx   . Encopresis Neg Hx   . Endocrine tumor Neg Hx   . Endocrinopathy Neg Hx   . Endometrial cancer Neg Hx   . Endometriosis Neg Hx   . Enuresis Neg Hx   . Eosinophilic granuloma Neg Hx   . Epididymitis Neg Hx   . Erectile dysfunction Neg Hx   . Erythema nodosum Neg Hx   . Esophageal cancer Neg Hx   . Esophageal varices Neg Hx   . Esophagitis Neg Hx   . Exostosis Neg Hx   . Fabry's disease Neg Hx   . Factor IX deficiency Neg Hx   . Factor V Leiden deficiency Neg Hx   . Factor VIII deficiency Neg Hx   . Failure to thrive Neg Hx   . Fainting Neg Hx   . Familial dysautonomia Neg Hx   . Familial nephritis Neg Hx   . Familial polyposis Neg Hx   . Fanconi anemia Neg Hx   . Febrile seizures Neg Hx   . Fibrocystic breast disease Neg Hx   . Fibroids Neg Hx   . Fibromyalgia Neg Hx   . Food intolerance Neg Hx   . Fragile X syndrome Neg Hx   . Friedreich's ataxia Neg Hx   . Frontotemporal dementia Neg Hx   . Fuch's dystrophy Neg Hx   . Gait disorder Neg Hx   . Galactosemia Neg Hx   . Gallbladder disease Neg Hx   . Gaucher's disease Neg Hx   . Genodermatoses Neg Hx   . GER disease Neg Hx   . Gestational diabetes Neg Hx   . GI problems Neg Hx   . Glaucoma Neg Hx   .  Glomerulonephritis Neg Hx   . Glucose-6-phos deficiency Neg Hx   . Glycogen storage disease Neg Hx   . Goiter Neg Hx   . Gonadal disorder Neg Hx   . Gout Neg Hx   . Graves' disease Neg Hx   . Growth hormone deficiency Neg Hx   . GU problems Neg Hx   . Hammer toes Neg Hx   . Hartnup's disease Neg Hx   . Hashimoto's thyroiditis Neg Hx   . Healthy Neg Hx   . Hearing loss Neg Hx   . Heart block Neg Hx   . Heart defect Neg Hx   . Heart disease Neg Hx   . Heart failure Neg Hx   . Heart murmur Neg Hx   . Hemangiomas Neg Hx   . Hematuria Neg Hx   . Hemochromatosis Neg Hx   . Hemolytic uremic syndrome Neg Hx   . Hemophilia Neg Hx   . Henoch-Schonlein purpura Neg Hx   . Hepatitis Neg Hx   . Hepatomegaly Neg Hx   . Hereditary spherocytosis Neg Hx   . Hernia Neg Hx   . Hiatal hernia Neg Hx   . High arches Neg Hx   . Hip dysplasia Neg Hx   . Hip fracture Neg Hx   . Hirschsprung's disease Neg Hx   . Hirsutism  Neg Hx   . Histiocytosis X Neg Hx   . HIV Neg Hx   . HLA-B27 positive Neg Hx   . Hodgkin's lymphoma Neg Hx   . Homocystinuria Neg Hx   . Hunter's disease Neg Hx   . Huntington's disease Neg Hx   . Hydrocele Neg Hx   . Hydrocephalus Neg Hx   . Hygroma Neg Hx   . Hypercalcemia Neg Hx   . Hypereosinophilic syndrome Neg Hx   . Hyperinsulinemia Neg Hx   . Hyperkalemia Neg Hx   . Hyperlipidemia Neg Hx   . Hypermobility Neg Hx   . Hypernasality Neg Hx   . Hyperopia Neg Hx   . Hyperparathyroidism Neg Hx   . Hypersomnolence Neg Hx   . Hypertension Neg Hx   . Hyperthyroidism Neg Hx   . Hypertrophic cardiomyopathy Neg Hx   . Hypokalemia Neg Hx   . Hypoparathyroidism Neg Hx   . Hypoplastic kidney Neg Hx   . Hypotension Neg Hx   . Hypothyroidism Neg Hx   . Idiopathic pulmonary fibrosis Neg Hx   . Idiopathic torsion dystonia Neg Hx   . IgA nephropathy Neg Hx   . Immunodeficiency Neg Hx   . Imperforate anus Neg Hx   . Impotence Neg Hx   . Impulse control disorder Neg Hx     . Incompetent cervix Neg Hx   . Infertility Neg Hx   . Inflammatory bowel disease Neg Hx   . Inguinal hernia Neg Hx   . Insomnia Neg Hx   . Insulin resistance Neg Hx   . Interstitial cystitis Neg Hx   . Intestinal malrotation Neg Hx   . Intestinal polyp Neg Hx   . Intracerebral hemorrhage Neg Hx   . Iron deficiency Neg Hx   . Irregular heart beat Neg Hx   . Irritable bowel syndrome Neg Hx   . Jaundice Neg Hx   . Job's syndrome Neg Hx   . Joint hypermobility Neg Hx   . Juvenile idiopathic arthritis Neg Hx   . Kartagener's syndrome Neg Hx   . Kawasaki disease Neg Hx   . Keloids Neg Hx   . Kennedy's disease Neg Hx   . Keratoconus Neg Hx   . Kidney cancer Neg Hx   . Kidney disease Neg Hx   . Kidney failure Neg Hx   . Kidney nephrosis Neg Hx   . Klinefelter's syndrome Neg Hx   . Krabbe disease Neg Hx   . Kyphosis Neg Hx   . Labyrinthitis Neg Hx   . Lactose intolerance Neg Hx   . Language disorder Neg Hx   . Lead poisoning Neg Hx   . Learning disabilities Neg Hx   . Legg-Calve-Perthes disease Neg Hx   . Lesch-Nyhan syndrome Neg Hx   . Leukemia Neg Hx   . Lichen planus Neg Hx   . Li-Fraumeni syndrome Neg Hx   . Liver cancer Neg Hx   . Liver disease Neg Hx   . Long QT syndrome Neg Hx   . Lumbar disc disease Neg Hx   . Lung cancer Neg Hx   . Lung disease Neg Hx   . Lupus Neg Hx   . Lymphoma Neg Hx   . Macrocephaly Neg Hx   . Macrosomia Neg Hx   . Macular degeneration Neg Hx   . Malignant hypertension Neg Hx   . Malignant hyperthermia Neg Hx   . Maple syrup urine disease Neg Hx   .  Marfan syndrome Neg Hx   . Mastocytosis Neg Hx   . Melanoma Neg Hx   . Memory loss Neg Hx   . Meniere's disease Neg Hx   . Menstrual problems Neg Hx   . Mental illness Neg Hx   . Mental retardation Neg Hx   . Metabolic syndrome Neg Hx   . Microcephaly Neg Hx   . Migraines Neg Hx   . Milk intolerance Neg Hx   . Miscarriages / Stillbirths Neg Hx   . Mitochondrial disorder Neg Hx   .  Mitral valve prolapse Neg Hx   . Motor neuron disease Neg Hx   . Movement disorder Neg Hx   . Moyamoya disease Neg Hx   . Multiple births Neg Hx   . Multiple endocrine neoplasia Neg Hx   . Multiple fractures Neg Hx   . Multiple myeloma Neg Hx   . Multiple sclerosis Neg Hx   . Muscle cancer Neg Hx   . Muscular dystrophy Neg Hx   . Myasthenia gravis Neg Hx   . Myelodysplastic syndrome Neg Hx   . Myocarditis Neg Hx   . Myoclonus Neg Hx   . Myopathy Neg Hx   . Nail disease Neg Hx   . Narcolepsy Neg Hx   . Nephritis Neg Hx   . Nephrolithiasis Neg Hx   . Nephrotic syndrome Neg Hx   . Neural tube defect Neg Hx   . Neurodegenerative disease Neg Hx   . Neurofibromatosis Neg Hx   . Neuromuscular disorder Neg Hx   . Neuropathy Neg Hx   . Neutropenia Neg Hx   . Nevi Neg Hx   . Niemann-Pick disease Neg Hx   . Night blindness Neg Hx   . Nocturnal enuresis Neg Hx   . Nystagmus Neg Hx   . Obesity Neg Hx   . OCD Neg Hx   . ODD Neg Hx   . Orchitis Neg Hx   . Osler-Weber-Rendu syndrome Neg Hx   . Osteoarthritis Neg Hx   . Osteochondroma Neg Hx   . Osteogenesis imperfecta Neg Hx   . Osteopenia Neg Hx   . Osteoporosis Neg Hx   . Osteosarcoma Neg Hx   . Osteosclerosis Neg Hx   . Other Neg Hx   . Otitis media Neg Hx   . Ovarian cancer Neg Hx   . Ovarian cysts Neg Hx   . Oxalosis Neg Hx   . Paget's disease of bone Neg Hx   . Pancreatic cancer Neg Hx   . Pancreatitis Neg Hx   . Panhypopituitarism Neg Hx   . Panic disorder Neg Hx   . Paranoid behavior Neg Hx   . Parasomnia Neg Hx   . Parkinsonism Neg Hx   . Patau's syndrome Neg Hx   . Pathological gambling Neg Hx   . PDD Neg Hx   . Pectus carinatum Neg Hx   . Pectus excavatum Neg Hx   . Pelvic inflammatory disease Neg Hx   . Pemphigus vulgaris Neg Hx   . Penile cancer Neg Hx   . Periodic limb movement Neg Hx   . Peripheral vascular disease Neg Hx   . Pernicious anemia Neg Hx   . Personality disorder Neg Hx   . Pes cavus Neg  Hx   . Physical abuse Neg Hx   . Otilio Jefferson sequence Neg Hx   . PKU Neg Hx   . Plantar fasciitis Neg Hx   . Pleurisy Neg Hx   .  Pneumonia Neg Hx   . Polychondritis Neg Hx   . Polycystic kidney disease Neg Hx   . Polycystic ovary syndrome Neg Hx   . Polycythemia Neg Hx   . Polymyalgia rheumatica Neg Hx   . Polymyositis Neg Hx   . Pompe disease Neg Hx   . Positive PPD/TB Exposure Neg Hx   . Posterior urethral valves Neg Hx   . Post-traumatic stress disorder Neg Hx   . Prader-Willi syndrome Neg Hx   . Premature birth Neg Hx   . Premature ovarian failure Neg Hx   . Preterm labor Neg Hx   . Prolactinoma Neg Hx   . Prostate cancer Neg Hx   . Prostatitis Neg Hx   . Protein C deficiency Neg Hx   . Protein S deficiency Neg Hx   . Proteinuria Neg Hx   . Prune belly syndrome Neg Hx   . Pruritis Neg Hx   . Pseudochol deficiency Neg Hx   . Pseudotumor cerebri Neg Hx   . Psoriasis Neg Hx   . Psychosis Neg Hx   . Ptosis Neg Hx   . Pulmonary embolism Neg Hx   . Pulmonary fibrosis Neg Hx   . Pyelonephritis Neg Hx   . Pyloric stenosis Neg Hx   . Pyruvate dehydrogenase deficiency Neg Hx   . Rashes / Skin problems Neg Hx   . Raynaud syndrome Neg Hx   . Rectal cancer Neg Hx   . Recurrent abdominal pain Neg Hx   . Reiter's syndrome Neg Hx   . Renal tubular acidosis Neg Hx   . Restless legs syndrome Neg Hx   . Retinal degeneration Neg Hx   . Retinal detachment Neg Hx   . Retinitis pigmentosa Neg Hx   . Retinoblastoma Neg Hx   . Retinopathy of prematurity Neg Hx   . Reye's syndrome Neg Hx   . Rheum arthritis Neg Hx   . Rheumatic fever Neg Hx   . Rheumatologic disease Neg Hx   . Rickets Neg Hx   . Rosacea Neg Hx   . Sacroiliitis Neg Hx   . Sarcoidosis Neg Hx   . Schizophrenia Neg Hx   . Scleritis Neg Hx   . Scleroderma Neg Hx   . Scoliosis Neg Hx   . Seasonal affective disorder Neg Hx   . Seizures Neg Hx   . Selective mutism Neg Hx   . Sensorineural hearing loss Neg Hx   .  Severe combined immunodeficiency Neg Hx   . Severe sprains Neg Hx   . Sexual abuse Neg Hx   . Short stature Neg Hx   . Sick sinus syndrome Neg Hx   . Sickle cell anemia Neg Hx   . Sickle cell trait Neg Hx   . SIDS Neg Hx   . Single kidney Neg Hx   . Sinusitis Neg Hx   . Sjogren's syndrome Neg Hx   . Skeletal dysplasia Neg Hx   . Skin cancer Neg Hx   . Skin telangiectasia Neg Hx   . Sleep apnea Neg Hx   . Sleep disorder Neg Hx   . Sleep walking Neg Hx   . Snoring Neg Hx   . Social phobia Neg Hx   . Speech disorder Neg Hx   . Spina bifida Neg Hx   . Spinal muscular atrophy Neg Hx   . Splenomegaly Neg Hx   . Spondyloarthropathy Neg Hx   . Spondylolisthesis Neg Hx   . Spondylolysis Neg  Hx   . Squamous cell carcinoma Neg Hx   . Stevens-Johnson syndrome Neg Hx   . Stickler syndrome Neg Hx   . Stomach cancer Neg Hx   . Strabismus Neg Hx   . Stroke Neg Hx   . Stuttering Neg Hx   . Subarachnoid hemorrhage Neg Hx   . Sudden death Neg Hx   . Suicidality Neg Hx   . Supraventricular tachycardia Neg Hx   . Swallowing difficulties Neg Hx   . Tall stature Neg Hx   . Tay-Sachs disease Neg Hx   . Testicular cancer Neg Hx   . Thalassemia Neg Hx   . Throat cancer Neg Hx   . Thrombocytopenia Neg Hx   . Thrombophilia Neg Hx   . Thrombophlebitis Neg Hx   . Thrombosis Neg Hx   . Thymic aplasia Neg Hx   . Thyroid cancer Neg Hx   . Thyroid disease Neg Hx   . Thyroid nodules Neg Hx   . Tics Neg Hx   . Tongue cancer Neg Hx   . Torticollis Neg Hx   . Tourette syndrome Neg Hx   . Tracheal cancer Neg Hx   . Tracheoesophageal fistual Neg Hx   . Tracheomalacia Neg Hx   . Transient ischemic attack Neg Hx   . Treacher Collins syndrome Neg Hx   . Tremor Neg Hx   . Tuberculosis Neg Hx   . Tuberous sclerosis Neg Hx   . Turner syndrome Neg Hx   . Ulcerative colitis Neg Hx   . Ulcers Neg Hx   . Undescended testes Neg Hx   . Unexplained death Neg Hx   . Urinary tract infection Neg Hx   .  Urolithiasis Neg Hx   . Urticaria Neg Hx   . Uterine cancer Neg Hx   . Uveitis Neg Hx   . Vaginal cancer Neg Hx   . Valvular heart disease Neg Hx   . Vasculitis Neg Hx   . Velocardiofacial syndrome Neg Hx   . Venous thrombosis Neg Hx   . Verruca vulgaris  Neg Hx   . Vesicoureteral reflux Neg Hx   . Vision loss Neg Hx   . Vitamin D deficiency Neg Hx   . Vitiligo Neg Hx   . Voice disorder Neg Hx   . Von Gierke's disease Neg Hx   . Von Hippel-Lindau syndrome Neg Hx   . Von Willebrand disease Neg Hx   . Werdnig Hoffman Disease Neg Hx   . Williams syndrome Neg Hx   . Wilm's tumor Neg Hx   . Wilson's disease Neg Hx   . Wiskott-Aldrich syndrome Neg Hx   . Evelene Croon Parkinson White syndrome Neg Hx   . Xeroderma pigmentosa Neg Hx   . Heart attack Mother   . Heart attack Father     Review of Systems: The patient denies any heat or cold intolerance.  No weight gain or weight loss.  The patient denies headaches or blurry vision.  There is no cough or sputum production.  The patient denies dizziness.  There is no hematuria or hematochezia.  The patient denies any muscle aches or arthritis.  The patient denies any rash.  The patient denies frequent falling or instability.  There is no history of depression or anxiety.  All other systems were reviewed and are negative.   Physical Exam: Filed Vitals:   11/16/12 1359  BP: 118/60  Pulse: 62   the general appearance reveals an elderly woman in  no distress.  She is legally blind.The head and neck exam reveals pupils equal and reactive.  Extraocular movements are full.  There is no scleral icterus.  The mouth and pharynx are normal.  The neck is supple.  The carotids reveal no bruits.  The jugular venous pressure is normal.  The  thyroid is not enlarged.  There is no lymphadenopathy.  The chest is clear to percussion and auscultation.  There are no rales or rhonchi.  Expansion of the chest is symmetrical.  The precordium is quiet.  The first heart sound  is normal.  The second heart sound is physiologically split.  There is no murmur gallop rub or click.  There is no abnormal lift or heave.  The abdomen is soft and nontender.  The bowel sounds are normal.  The liver and spleen are not enlarged.  There are no abdominal masses.  There are no abdominal bruits.  Extremities reveal good pedal pulses.  There is no phlebitis or edema.  She has bilateral varicose veins in lower extremities. There is no cyanosis or clubbing.  Strength is normal and symmetrical in all extremities.  There is no lateralizing weakness.  There are no sensory deficits.  The skin is warm and dry.  There is no rash.  EKG shows sinus bradycardia and otherwise within normal limits    Assessment / Plan: Overall the patient is doing well.  She will continue same medication and be rechecked in 4 months.

## 2012-11-16 NOTE — Assessment & Plan Note (Signed)
The patient has a history of osteoarthritis with pain in her back and legs and is scheduled to see her orthopedist Dr. Jerl Santos soon

## 2012-11-16 NOTE — Assessment & Plan Note (Signed)
The patient denies any exertional chest pain.  No symptoms of CHF.  No peripheral edema

## 2012-11-16 NOTE — Assessment & Plan Note (Signed)
The patient has had no recurrent chest pain to suggest recurrent pericarditis.  Her EKG today shows no ischemic changes or changes of pericarditis.

## 2012-12-08 ENCOUNTER — Ambulatory Visit (INDEPENDENT_AMBULATORY_CARE_PROVIDER_SITE_OTHER): Payer: Medicare Other | Admitting: *Deleted

## 2013-01-03 ENCOUNTER — Ambulatory Visit (INDEPENDENT_AMBULATORY_CARE_PROVIDER_SITE_OTHER): Payer: Medicare Other | Admitting: *Deleted

## 2013-01-03 DIAGNOSIS — I82409 Acute embolism and thrombosis of unspecified deep veins of unspecified lower extremity: Secondary | ICD-10-CM

## 2013-01-03 LAB — POCT INR: INR: 2.4

## 2013-01-10 ENCOUNTER — Telehealth: Payer: Self-pay | Admitting: Cardiology

## 2013-01-10 NOTE — Telephone Encounter (Signed)
Would stop it altogether and just switch to a baby aspirin daily

## 2013-01-10 NOTE — Telephone Encounter (Signed)
Patient had spinal injection a while back and needs another one. Dr Yisroel Ramming is questioning why she is in coumadin. He Rx'd Tramadol 50 mg twice day and it says not to take if you are on Warfarin. Has a really hard time walking and this is this last injection she will be able to have. Patient said she didn't have DVT's but that was about 20 years ago, states it was phlebitis.  Will forward to  Dr. [name] for review.

## 2013-01-10 NOTE — Telephone Encounter (Signed)
New Problem:    Patient called in wanting to speak with you about a medication her orthopedic doctor prescribed for her.  Please call back.

## 2013-01-10 NOTE — Telephone Encounter (Signed)
Patient is questioning whether or not she needs to be on coumadin at all not just for injection. Will forward to  Dr. [name]

## 2013-01-10 NOTE — Telephone Encounter (Signed)
Her DVTs were remote. Okay to stop warfarin. Okay to proceed with orthopedic injection.

## 2013-01-11 NOTE — Telephone Encounter (Signed)
Advised patient, verbalized understanding  

## 2013-01-12 ENCOUNTER — Ambulatory Visit: Payer: Self-pay | Admitting: Pharmacist

## 2013-01-15 ENCOUNTER — Emergency Department (HOSPITAL_COMMUNITY): Payer: Medicare Other

## 2013-01-15 ENCOUNTER — Emergency Department (HOSPITAL_COMMUNITY)
Admission: EM | Admit: 2013-01-15 | Discharge: 2013-01-15 | Disposition: A | Discharge status: Home / Self Care | Payer: Medicare Other | Attending: Emergency Medicine | Admitting: Emergency Medicine

## 2013-01-15 DIAGNOSIS — S79919A Unspecified injury of unspecified hip, initial encounter: Secondary | ICD-10-CM | POA: Insufficient documentation

## 2013-01-15 DIAGNOSIS — M1990 Unspecified osteoarthritis, unspecified site: Secondary | ICD-10-CM | POA: Insufficient documentation

## 2013-01-15 DIAGNOSIS — Z862 Personal history of diseases of the blood and blood-forming organs and certain disorders involving the immune mechanism: Secondary | ICD-10-CM | POA: Insufficient documentation

## 2013-01-15 DIAGNOSIS — Z87828 Personal history of other (healed) physical injury and trauma: Secondary | ICD-10-CM | POA: Insufficient documentation

## 2013-01-15 DIAGNOSIS — Z7982 Long term (current) use of aspirin: Secondary | ICD-10-CM | POA: Insufficient documentation

## 2013-01-15 DIAGNOSIS — W010XXA Fall on same level from slipping, tripping and stumbling without subsequent striking against object, initial encounter: Secondary | ICD-10-CM | POA: Insufficient documentation

## 2013-01-15 DIAGNOSIS — Z79899 Other long term (current) drug therapy: Secondary | ICD-10-CM | POA: Insufficient documentation

## 2013-01-15 DIAGNOSIS — Z8669 Personal history of other diseases of the nervous system and sense organs: Secondary | ICD-10-CM | POA: Insufficient documentation

## 2013-01-15 DIAGNOSIS — Z8781 Personal history of (healed) traumatic fracture: Secondary | ICD-10-CM | POA: Insufficient documentation

## 2013-01-15 DIAGNOSIS — M25552 Pain in left hip: Secondary | ICD-10-CM

## 2013-01-15 DIAGNOSIS — W19XXXA Unspecified fall, initial encounter: Secondary | ICD-10-CM

## 2013-01-15 DIAGNOSIS — Z87891 Personal history of nicotine dependence: Secondary | ICD-10-CM | POA: Insufficient documentation

## 2013-01-15 DIAGNOSIS — Y92009 Unspecified place in unspecified non-institutional (private) residence as the place of occurrence of the external cause: Secondary | ICD-10-CM | POA: Insufficient documentation

## 2013-01-15 DIAGNOSIS — Z9889 Other specified postprocedural states: Secondary | ICD-10-CM | POA: Insufficient documentation

## 2013-01-15 DIAGNOSIS — Y9301 Activity, walking, marching and hiking: Secondary | ICD-10-CM | POA: Insufficient documentation

## 2013-01-15 DIAGNOSIS — Z87440 Personal history of urinary (tract) infections: Secondary | ICD-10-CM | POA: Insufficient documentation

## 2013-01-15 DIAGNOSIS — IMO0002 Reserved for concepts with insufficient information to code with codable children: Secondary | ICD-10-CM | POA: Insufficient documentation

## 2013-01-15 DIAGNOSIS — Z7901 Long term (current) use of anticoagulants: Secondary | ICD-10-CM | POA: Insufficient documentation

## 2013-01-15 DIAGNOSIS — F0390 Unspecified dementia without behavioral disturbance: Secondary | ICD-10-CM | POA: Insufficient documentation

## 2013-01-15 DIAGNOSIS — E039 Hypothyroidism, unspecified: Secondary | ICD-10-CM | POA: Insufficient documentation

## 2013-01-15 DIAGNOSIS — I10 Essential (primary) hypertension: Secondary | ICD-10-CM | POA: Insufficient documentation

## 2013-01-15 DIAGNOSIS — Z86718 Personal history of other venous thrombosis and embolism: Secondary | ICD-10-CM | POA: Insufficient documentation

## 2013-01-15 DIAGNOSIS — Z8679 Personal history of other diseases of the circulatory system: Secondary | ICD-10-CM | POA: Insufficient documentation

## 2013-01-15 LAB — CBC WITH DIFFERENTIAL/PLATELET
Eosinophils Absolute: 0 10*3/uL (ref 0.0–0.7)
Hemoglobin: 14.6 g/dL (ref 12.0–15.0)
Lymphocytes Relative: 17 % (ref 12–46)
Lymphs Abs: 2.2 10*3/uL (ref 0.7–4.0)
MCH: 34.2 pg — ABNORMAL HIGH (ref 26.0–34.0)
Monocytes Relative: 9 % (ref 3–12)
Neutro Abs: 9.3 10*3/uL — ABNORMAL HIGH (ref 1.7–7.7)
Neutrophils Relative %: 73 % (ref 43–77)
RBC: 4.27 MIL/uL (ref 3.87–5.11)
WBC: 12.7 10*3/uL — ABNORMAL HIGH (ref 4.0–10.5)

## 2013-01-15 LAB — URINALYSIS, ROUTINE W REFLEX MICROSCOPIC
Bilirubin Urine: NEGATIVE
Nitrite: NEGATIVE
Specific Gravity, Urine: 1.014 (ref 1.005–1.030)
pH: 6.5 (ref 5.0–8.0)

## 2013-01-15 LAB — BASIC METABOLIC PANEL
BUN: 13 mg/dL (ref 6–23)
CO2: 30 mEq/L (ref 19–32)
Chloride: 99 mEq/L (ref 96–112)
Glucose, Bld: 102 mg/dL — ABNORMAL HIGH (ref 70–99)
Potassium: 3.9 mEq/L (ref 3.5–5.1)
Sodium: 138 mEq/L (ref 135–145)

## 2013-01-15 MED ORDER — HYDROCODONE-ACETAMINOPHEN 5-325 MG PO TABS
1.0000 | ORAL_TABLET | Freq: Once | ORAL | Status: AC
  Administered 2013-01-15: 1 via ORAL
  Filled 2013-01-15: qty 1

## 2013-01-15 MED ORDER — ONDANSETRON 4 MG PO TBDP
8.0000 mg | ORAL_TABLET | Freq: Once | ORAL | Status: AC
  Administered 2013-01-15: 8 mg via ORAL
  Filled 2013-01-15: qty 2

## 2013-01-15 NOTE — ED Notes (Addendum)
The patient is AOx4 and comfortable with her discharge instructions.  Her brother is driving her home.

## 2013-01-15 NOTE — ED Notes (Addendum)
Per EMS pt was walking with walker and foot got caught. Larey Seat forward and hit left hip. Pt has hx left hip frax with surgical repair. C/o left hip pain. Denies neck or back pain. No LOC. No neuro deficits. 100 mcg Fentnyl. 22g in Left wrist. Deformity noted to left hip and shortening and inward rotation. Friends reported increased confusion x3 days.

## 2013-01-15 NOTE — ED Provider Notes (Signed)
History     CSN: [id number]  Arrival date & time 01/15/13  1552   First MD Initiated Contact with Patient 01/15/13 1553      No chief complaint on file.   (Consider location/radiation/quality/duration/timing/severity/associated sxs/prior treatment) HPI Comments: Patient is an 79 year old female who presents today after a fall. She states she usually walks with a walker, but today she was walking out to her porch without her walker when she feels as though her legs gave out from under her. She landed on her left hip. She has had previous surgery on this hip. She was evaluated for this one week ago via x-ray. She states it was noted on her x-ray that one of her screws from a previous hip surgery was coming loose. Her pain is nonradiating and her left hip. She states she was anticoagulated until last week when it was discontinued. Now, she only takes a daily aspirin. She did not hit her head, no loss of consciousness, no chest pain, no shortness of breath. Her family reports increased confusion in the past few days. No nausea, vomiting, abdominal pain, fever, chills, recent illness, numbness, weakness.  Patient is a 79 y.o. female presenting with fall and hip pain. The history is provided by the patient. No language interpreter was used.  Fall The accident occurred less than 1 hour ago. The fall occurred while walking. She landed on a hard floor. The point of impact was the left hip. The pain is present in the left hip. The pain is moderate. She was not ambulatory at the scene. There was no entrapment after the fall. There was no drug use involved in the accident. There was no alcohol use involved in the accident. Pertinent negatives include no visual change, no fever, no numbness, no abdominal pain, no bowel incontinence, no nausea, no vomiting, no headaches, no loss of consciousness and no tingling. The symptoms are aggravated by pressure on the injury.  Hip Pain This is a new problem. The current  episode started today. The problem occurs constantly. The problem has been unchanged. Pertinent negatives include no abdominal pain, fever, headaches, nausea, numbness, visual change or vomiting.    Past Medical History  Diagnosis Date  . Hypertension   . Rheumatoid arteritis   . Phlebitis   . Deep vein thrombophlebitis of leg   . Fractured pelvis     fall 2011  . Thyroid disease     hypothyroidism  . Humerus fracture   . Thrombocytopenia   . Leukocytosis   . Hypothyroidism   . Phlebitis      Recurrent phlebitis  . Hematoma      Right superior and inferior pubic rami fractures with hematoma adjacent to the right ramus fracture  . Urinary tract infection   . Humerus fracture      Right proximal humerus fracture  . Osteoarthritis     for which she takes chronic prednisone  . Macular degeneration   . Chronic anticoagulation     Past Surgical History  Procedure Laterality Date  . Left hip open reduction    . Cholecystectomy    . Abdominal hysterectomy  s  . Sinus surgery with instatrak      Family History  Problem Relation Age of Onset  . Abnormal EKG Neg Hx   . Abnormal newborn screen Neg Hx   . Achalasia Neg Hx   . Achondroplasia Neg Hx   . Acne Neg Hx   . Acromegaly Neg Hx   . Actinic  keratosis Neg Hx   . Acute lymphoblastic leukemia Neg Hx   . Addison's disease Neg Hx   . Adrenal disorder Neg Hx   . Albinism Neg Hx   . Alcohol abuse Neg Hx   . Alkaptonuria Neg Hx   . Allergic rhinitis Neg Hx   . Allergies Neg Hx   . Allergy (severe) Neg Hx   . Alopecia Neg Hx   . Alpha-1 antitrypsin deficiency Neg Hx   . Alport syndrome Neg Hx   . ALS Neg Hx   . Alzheimer's disease Neg Hx   . Ambiguous genitalia Neg Hx   . Amblyopia Neg Hx   . Amenorrhea Neg Hx   . Anal fissures Neg Hx   . Anemia Neg Hx   . Anesthesia problems Neg Hx   . Aneurysm Neg Hx   . Angelman syndrome Neg Hx   . Angina Neg Hx   . Angioedema Neg Hx   . Ankylosing spondylitis Neg Hx   .  Anorectal malformation Neg Hx   . Anorexia nervosa Neg Hx   . Anti-cardiolipin syndrome Neg Hx   . Antithrombin III deficiency Neg Hx   . Anuerysm Neg Hx   . Anxiety disorder Neg Hx   . Aortic aneurysm Neg Hx   . Aortic dissection Neg Hx   . Aortic stenosis Neg Hx   . Aphthous stomatitis Neg Hx   . Aplastic anemia Neg Hx   . Appendicitis Neg Hx   . Apraxia Neg Hx   . Arnold-Chiari malformation Neg Hx   . Arrhythmia Neg Hx   . Arthritis Neg Hx   . Asperger's syndrome Neg Hx   . Asthma Neg Hx   . Ataxia Neg Hx   . Ataxia telangiectasia Neg Hx   . Atopy Neg Hx   . Atrial fibrillation Neg Hx   . Atrophic kidney Neg Hx   . Auditory processing disorder Neg Hx   . Autism Neg Hx   . Autism spectrum disorder Neg Hx   . Autoimmune disease Neg Hx   . AVM Neg Hx   . Baker's cyst Neg Hx   . Barrett's esophagus Neg Hx   . Bartter's syndrome Neg Hx   . Basal cell carcinoma Neg Hx   . Behavior problems Neg Hx   . Bell's palsy Neg Hx   . Benign prostatic hyperplasia Neg Hx   . Bipolar disorder Neg Hx   . Birth defects Neg Hx   . Birth marks Neg Hx   . Blindness Neg Hx   . Bone cancer Neg Hx   . BOR syndrome Neg Hx   . Bow legs Neg Hx   . Bradycardia Neg Hx   . Brain cancer Neg Hx   . BRCA 1/2 Neg Hx   . Breast cancer Neg Hx   . Broken bones Neg Hx   . Bronchiolitis Neg Hx   . Bronchopulmonary dysplasia Neg Hx   . Bruton's disease Neg Hx   . Bulemia Neg Hx   . Bullous pemphigoid Neg Hx   . Bunion Neg Hx   . Bursitis Neg Hx   . Caf-au-lait spots Neg Hx   . Calcium disorder Neg Hx   . Canavan disease Neg Hx   . Cancer Neg Hx   . Cardiomyopathy Neg Hx   . Carpal tunnel syndrome Neg Hx   . Cataracts Neg Hx   . Celiac disease Neg Hx   . Cerebral aneurysm Neg Hx   .  Cerebral palsy Neg Hx   . Cervical cancer Neg Hx   . Cervical polyp Neg Hx   . Cervicitis Neg Hx   . Chalasia Neg Hx   . Chalazion Neg Hx   . Charcot-Marie-Tooth disease Neg Hx   . Chediak-Higashi syndrome  Neg Hx   . Chiari malformation Neg Hx   . Childhood respiratory disease Neg Hx   . Choanal atresia Neg Hx   . Cholecystitis Neg Hx   . Cholelithiasis Neg Hx   . Cholesteatoma Neg Hx   . Chondromalacia Neg Hx   . Chorea Neg Hx   . Chromosomal disorder Neg Hx   . Chronic bronchitis Neg Hx   . Chronic fatigue Neg Hx   . Chronic granulomatous disease Neg Hx   . Chronic infections Neg Hx   . Cirrhosis Neg Hx   . Cleft lip Neg Hx   . Cleft palate Neg Hx   . Clotting disorder Neg Hx   . Club foot Neg Hx   . Coarctation of the aorta Neg Hx   . Colitis Neg Hx   . Collagen disease Neg Hx   . Colon cancer Neg Hx   . Colon polyps Neg Hx   . Colonic polyp Neg Hx   . Color blindness Neg Hx   . Conduct disorder Neg Hx   . Conductive hearing loss Neg Hx   . Congenital adrenal hyperplasia Neg Hx   . Congenital heart disease Neg Hx   . Conjunctivitis Neg Hx   . Consanguinity Neg Hx   . Constipation Neg Hx   . COPD Neg Hx   . Corneal abrasion Neg Hx   . Corneal ulcer Neg Hx   . Coronary aneurysm Neg Hx   . Coronary artery disease Neg Hx   . Cowden syndrome Neg Hx   . Craniosynostosis Neg Hx   . Cri-du-chat syndrome Neg Hx   . Crohn's disease Neg Hx   . Cushing syndrome Neg Hx   . Cystic fibrosis Neg Hx   . Cystic kidney disease Neg Hx   . Cystinosis Neg Hx   . Decreased libido Neg Hx   . Deep vein thrombosis Neg Hx   . Delayed menopause Neg Hx   . Delayed puberty Neg Hx   . Dementia Neg Hx   . Dental caries Neg Hx   . Depression Neg Hx   . Dermatomyositis Neg Hx   . DES usage Neg Hx   . Developmental delay Neg Hx   . Diabetes Neg Hx   . Diabetes insipidus Neg Hx   . Diabetes type I Neg Hx   . Diabetes type II Neg Hx   . Diabetic kidney disease Neg Hx   . DiGeorge syndrome Neg Hx   . Dilated cardiomyopathy Neg Hx   . Dislocations Neg Hx   . Diverticulitis Neg Hx   . Diverticulosis Neg Hx   . Down syndrome Neg Hx   . Drug abuse Neg Hx   . Dupuytren's contracture Neg Hx    . Dwarfism Neg Hx   . Dysfunctional uterine bleeding Neg Hx   . Dysmenorrhea Neg Hx   . Dyspareunia Neg Hx   . Dysphagia Neg Hx   . Dysrhythmia Neg Hx   . Dystonia Neg Hx   . Early death Neg Hx   . Early menopause Neg Hx   . Early puberty Neg Hx   . Eating disorder Neg Hx   . Eclampsia Neg Hx   .  Ectodermal dysplasia Neg Hx   . Eczema Neg Hx   . Edema Neg Hx   . Edward's syndrome Neg Hx   . Ehlers-Danlos syndrome Neg Hx   . Emotional abuse Neg Hx   . Emphysema Neg Hx   . Encopresis Neg Hx   . Endocrine tumor Neg Hx   . Endocrinopathy Neg Hx   . Endometrial cancer Neg Hx   . Endometriosis Neg Hx   . Enuresis Neg Hx   . Eosinophilic granuloma Neg Hx   . Epididymitis Neg Hx   . Erectile dysfunction Neg Hx   . Erythema nodosum Neg Hx   . Esophageal cancer Neg Hx   . Esophageal varices Neg Hx   . Esophagitis Neg Hx   . Exostosis Neg Hx   . Fabry's disease Neg Hx   . Factor IX deficiency Neg Hx   . Factor V Leiden deficiency Neg Hx   . Factor VIII deficiency Neg Hx   . Failure to thrive Neg Hx   . Fainting Neg Hx   . Familial dysautonomia Neg Hx   . Familial nephritis Neg Hx   . Familial polyposis Neg Hx   . Fanconi anemia Neg Hx   . Febrile seizures Neg Hx   . Fibrocystic breast disease Neg Hx   . Fibroids Neg Hx   . Fibromyalgia Neg Hx   . Food intolerance Neg Hx   . Fragile X syndrome Neg Hx   . Friedreich's ataxia Neg Hx   . Frontotemporal dementia Neg Hx   . Fuch's dystrophy Neg Hx   . Gait disorder Neg Hx   . Galactosemia Neg Hx   . Gallbladder disease Neg Hx   . Gaucher's disease Neg Hx   . Genodermatoses Neg Hx   . GER disease Neg Hx   . Gestational diabetes Neg Hx   . GI problems Neg Hx   . Glaucoma Neg Hx   . Glomerulonephritis Neg Hx   . Glucose-6-phos deficiency Neg Hx   . Glycogen storage disease Neg Hx   . Goiter Neg Hx   . Gonadal disorder Neg Hx   . Gout Neg Hx   . Graves' disease Neg Hx   . Growth hormone deficiency Neg Hx   . GU  problems Neg Hx   . Hammer toes Neg Hx   . Hartnup's disease Neg Hx   . Hashimoto's thyroiditis Neg Hx   . Healthy Neg Hx   . Hearing loss Neg Hx   . Heart block Neg Hx   . Heart defect Neg Hx   . Heart disease Neg Hx   . Heart failure Neg Hx   . Heart murmur Neg Hx   . Hemangiomas Neg Hx   . Hematuria Neg Hx   . Hemochromatosis Neg Hx   . Hemolytic uremic syndrome Neg Hx   . Hemophilia Neg Hx   . Henoch-Schonlein purpura Neg Hx   . Hepatitis Neg Hx   . Hepatomegaly Neg Hx   . Hereditary spherocytosis Neg Hx   . Hernia Neg Hx   . Hiatal hernia Neg Hx   . High arches Neg Hx   . Hip dysplasia Neg Hx   . Hip fracture Neg Hx   . Hirschsprung's disease Neg Hx   . Hirsutism Neg Hx   . Histiocytosis X Neg Hx   . HIV Neg Hx   . HLA-B27 positive Neg Hx   . Hodgkin's lymphoma Neg Hx   . Homocystinuria Neg  Hx   . Hunter's disease Neg Hx   . Huntington's disease Neg Hx   . Hydrocele Neg Hx   . Hydrocephalus Neg Hx   . Hygroma Neg Hx   . Hypercalcemia Neg Hx   . Hypereosinophilic syndrome Neg Hx   . Hyperinsulinemia Neg Hx   . Hyperkalemia Neg Hx   . Hyperlipidemia Neg Hx   . Hypermobility Neg Hx   . Hypernasality Neg Hx   . Hyperopia Neg Hx   . Hyperparathyroidism Neg Hx   . Hypersomnolence Neg Hx   . Hypertension Neg Hx   . Hyperthyroidism Neg Hx   . Hypertrophic cardiomyopathy Neg Hx   . Hypokalemia Neg Hx   . Hypoparathyroidism Neg Hx   . Hypoplastic kidney Neg Hx   . Hypotension Neg Hx   . Hypothyroidism Neg Hx   . Idiopathic pulmonary fibrosis Neg Hx   . Idiopathic torsion dystonia Neg Hx   . IgA nephropathy Neg Hx   . Immunodeficiency Neg Hx   . Imperforate anus Neg Hx   . Impotence Neg Hx   . Impulse control disorder Neg Hx   . Incompetent cervix Neg Hx   . Infertility Neg Hx   . Inflammatory bowel disease Neg Hx   . Inguinal hernia Neg Hx   . Insomnia Neg Hx   . Insulin resistance Neg Hx   . Interstitial cystitis Neg Hx   . Intestinal malrotation Neg  Hx   . Intestinal polyp Neg Hx   . Intracerebral hemorrhage Neg Hx   . Iron deficiency Neg Hx   . Irregular heart beat Neg Hx   . Irritable bowel syndrome Neg Hx   . Jaundice Neg Hx   . Job's syndrome Neg Hx   . Joint hypermobility Neg Hx   . Juvenile idiopathic arthritis Neg Hx   . Kartagener's syndrome Neg Hx   . Kawasaki disease Neg Hx   . Keloids Neg Hx   . Kennedy's disease Neg Hx   . Keratoconus Neg Hx   . Kidney cancer Neg Hx   . Kidney disease Neg Hx   . Kidney failure Neg Hx   . Kidney nephrosis Neg Hx   . Klinefelter's syndrome Neg Hx   . Krabbe disease Neg Hx   . Kyphosis Neg Hx   . Labyrinthitis Neg Hx   . Lactose intolerance Neg Hx   . Language disorder Neg Hx   . Lead poisoning Neg Hx   . Learning disabilities Neg Hx   . Legg-Calve-Perthes disease Neg Hx   . Lesch-Nyhan syndrome Neg Hx   . Leukemia Neg Hx   . Lichen planus Neg Hx   . Li-Fraumeni syndrome Neg Hx   . Liver cancer Neg Hx   . Liver disease Neg Hx   . Long QT syndrome Neg Hx   . Lumbar disc disease Neg Hx   . Lung cancer Neg Hx   . Lung disease Neg Hx   . Lupus Neg Hx   . Lymphoma Neg Hx   . Macrocephaly Neg Hx   . Macrosomia Neg Hx   . Macular degeneration Neg Hx   . Malignant hypertension Neg Hx   . Malignant hyperthermia Neg Hx   . Maple syrup urine disease Neg Hx   . Marfan syndrome Neg Hx   . Mastocytosis Neg Hx   . Melanoma Neg Hx   . Memory loss Neg Hx   . Meniere's disease Neg Hx   . Menstrual problems  Neg Hx   . Mental illness Neg Hx   . Mental retardation Neg Hx   . Metabolic syndrome Neg Hx   . Microcephaly Neg Hx   . Migraines Neg Hx   . Milk intolerance Neg Hx   . Miscarriages / Stillbirths Neg Hx   . Mitochondrial disorder Neg Hx   . Mitral valve prolapse Neg Hx   . Motor neuron disease Neg Hx   . Movement disorder Neg Hx   . Moyamoya disease Neg Hx   . Multiple births Neg Hx   . Multiple endocrine neoplasia Neg Hx   . Multiple fractures Neg Hx   . Multiple  myeloma Neg Hx   . Multiple sclerosis Neg Hx   . Muscle cancer Neg Hx   . Muscular dystrophy Neg Hx   . Myasthenia gravis Neg Hx   . Myelodysplastic syndrome Neg Hx   . Myocarditis Neg Hx   . Myoclonus Neg Hx   . Myopathy Neg Hx   . Nail disease Neg Hx   . Narcolepsy Neg Hx   . Nephritis Neg Hx   . Nephrolithiasis Neg Hx   . Nephrotic syndrome Neg Hx   . Neural tube defect Neg Hx   . Neurodegenerative disease Neg Hx   . Neurofibromatosis Neg Hx   . Neuromuscular disorder Neg Hx   . Neuropathy Neg Hx   . Neutropenia Neg Hx   . Nevi Neg Hx   . Niemann-Pick disease Neg Hx   . Night blindness Neg Hx   . Nocturnal enuresis Neg Hx   . Nystagmus Neg Hx   . Obesity Neg Hx   . OCD Neg Hx   . ODD Neg Hx   . Orchitis Neg Hx   . Osler-Weber-Rendu syndrome Neg Hx   . Osteoarthritis Neg Hx   . Osteochondroma Neg Hx   . Osteogenesis imperfecta Neg Hx   . Osteopenia Neg Hx   . Osteoporosis Neg Hx   . Osteosarcoma Neg Hx   . Osteosclerosis Neg Hx   . Other Neg Hx   . Otitis media Neg Hx   . Ovarian cancer Neg Hx   . Ovarian cysts Neg Hx   . Oxalosis Neg Hx   . Paget's disease of bone Neg Hx   . Pancreatic cancer Neg Hx   . Pancreatitis Neg Hx   . Panhypopituitarism Neg Hx   . Panic disorder Neg Hx   . Paranoid behavior Neg Hx   . Parasomnia Neg Hx   . Parkinsonism Neg Hx   . Patau's syndrome Neg Hx   . Pathological gambling Neg Hx   . PDD Neg Hx   . Pectus carinatum Neg Hx   . Pectus excavatum Neg Hx   . Pelvic inflammatory disease Neg Hx   . Pemphigus vulgaris Neg Hx   . Penile cancer Neg Hx   . Periodic limb movement Neg Hx   . Peripheral vascular disease Neg Hx   . Pernicious anemia Neg Hx   . Personality disorder Neg Hx   . Pes cavus Neg Hx   . Physical abuse Neg Hx   . Otilio Jefferson sequence Neg Hx   . PKU Neg Hx   . Plantar fasciitis Neg Hx   . Pleurisy Neg Hx   . Pneumonia Neg Hx   . Polychondritis Neg Hx   . Polycystic kidney disease Neg Hx   . Polycystic  ovary syndrome Neg Hx   . Polycythemia Neg Hx   .  Polymyalgia rheumatica Neg Hx   . Polymyositis Neg Hx   . Pompe disease Neg Hx   . Positive PPD/TB Exposure Neg Hx   . Posterior urethral valves Neg Hx   . Post-traumatic stress disorder Neg Hx   . Prader-Willi syndrome Neg Hx   . Premature birth Neg Hx   . Premature ovarian failure Neg Hx   . Preterm labor Neg Hx   . Prolactinoma Neg Hx   . Prostate cancer Neg Hx   . Prostatitis Neg Hx   . Protein C deficiency Neg Hx   . Protein S deficiency Neg Hx   . Proteinuria Neg Hx   . Prune belly syndrome Neg Hx   . Pruritis Neg Hx   . Pseudochol deficiency Neg Hx   . Pseudotumor cerebri Neg Hx   . Psoriasis Neg Hx   . Psychosis Neg Hx   . Ptosis Neg Hx   . Pulmonary embolism Neg Hx   . Pulmonary fibrosis Neg Hx   . Pyelonephritis Neg Hx   . Pyloric stenosis Neg Hx   . Pyruvate dehydrogenase deficiency Neg Hx   . Rashes / Skin problems Neg Hx   . Raynaud syndrome Neg Hx   . Rectal cancer Neg Hx   . Recurrent abdominal pain Neg Hx   . Reiter's syndrome Neg Hx   . Renal tubular acidosis Neg Hx   . Restless legs syndrome Neg Hx   . Retinal degeneration Neg Hx   . Retinal detachment Neg Hx   . Retinitis pigmentosa Neg Hx   . Retinoblastoma Neg Hx   . Retinopathy of prematurity Neg Hx   . Reye's syndrome Neg Hx   . Rheum arthritis Neg Hx   . Rheumatic fever Neg Hx   . Rheumatologic disease Neg Hx   . Rickets Neg Hx   . Rosacea Neg Hx   . Sacroiliitis Neg Hx   . Sarcoidosis Neg Hx   . Schizophrenia Neg Hx   . Scleritis Neg Hx   . Scleroderma Neg Hx   . Scoliosis Neg Hx   . Seasonal affective disorder Neg Hx   . Seizures Neg Hx   . Selective mutism Neg Hx   . Sensorineural hearing loss Neg Hx   . Severe combined immunodeficiency Neg Hx   . Severe sprains Neg Hx   . Sexual abuse Neg Hx   . Short stature Neg Hx   . Sick sinus syndrome Neg Hx   . Sickle cell anemia Neg Hx   . Sickle cell trait Neg Hx   . SIDS Neg Hx   .  Single kidney Neg Hx   . Sinusitis Neg Hx   . Sjogren's syndrome Neg Hx   . Skeletal dysplasia Neg Hx   . Skin cancer Neg Hx   . Skin telangiectasia Neg Hx   . Sleep apnea Neg Hx   . Sleep disorder Neg Hx   . Sleep walking Neg Hx   . Snoring Neg Hx   . Social phobia Neg Hx   . Speech disorder Neg Hx   . Spina bifida Neg Hx   . Spinal muscular atrophy Neg Hx   . Splenomegaly Neg Hx   . Spondyloarthropathy Neg Hx   . Spondylolisthesis Neg Hx   . Spondylolysis Neg Hx   . Squamous cell carcinoma Neg Hx   . Stevens-Johnson syndrome Neg Hx   . Stickler syndrome Neg Hx   . Stomach cancer Neg Hx   . Strabismus  Neg Hx   . Stroke Neg Hx   . Stuttering Neg Hx   . Subarachnoid hemorrhage Neg Hx   . Sudden death Neg Hx   . Suicidality Neg Hx   . Supraventricular tachycardia Neg Hx   . Swallowing difficulties Neg Hx   . Tall stature Neg Hx   . Tay-Sachs disease Neg Hx   . Testicular cancer Neg Hx   . Thalassemia Neg Hx   . Throat cancer Neg Hx   . Thrombocytopenia Neg Hx   . Thrombophilia Neg Hx   . Thrombophlebitis Neg Hx   . Thrombosis Neg Hx   . Thymic aplasia Neg Hx   . Thyroid cancer Neg Hx   . Thyroid disease Neg Hx   . Thyroid nodules Neg Hx   . Tics Neg Hx   . Tongue cancer Neg Hx   . Torticollis Neg Hx   . Tourette syndrome Neg Hx   . Tracheal cancer Neg Hx   . Tracheoesophageal fistual Neg Hx   . Tracheomalacia Neg Hx   . Transient ischemic attack Neg Hx   . Treacher Collins syndrome Neg Hx   . Tremor Neg Hx   . Tuberculosis Neg Hx   . Tuberous sclerosis Neg Hx   . Turner syndrome Neg Hx   . Ulcerative colitis Neg Hx   . Ulcers Neg Hx   . Undescended testes Neg Hx   . Unexplained death Neg Hx   . Urinary tract infection Neg Hx   . Urolithiasis Neg Hx   . Urticaria Neg Hx   . Uterine cancer Neg Hx   . Uveitis Neg Hx   . Vaginal cancer Neg Hx   . Valvular heart disease Neg Hx   . Vasculitis Neg Hx   . Velocardiofacial syndrome Neg Hx   . Venous  thrombosis Neg Hx   . Verruca vulgaris  Neg Hx   . Vesicoureteral reflux Neg Hx   . Vision loss Neg Hx   . Vitamin D deficiency Neg Hx   . Vitiligo Neg Hx   . Voice disorder Neg Hx   . Von Gierke's disease Neg Hx   . Von Hippel-Lindau syndrome Neg Hx   . Von Willebrand disease Neg Hx   . Werdnig Hoffman Disease Neg Hx   . Williams syndrome Neg Hx   . Wilm's tumor Neg Hx   . Wilson's disease Neg Hx   . Wiskott-Aldrich syndrome Neg Hx   . Evelene Croon Parkinson White syndrome Neg Hx   . Xeroderma pigmentosa Neg Hx   . Heart attack Mother   . Heart attack Father     History  Substance Use Topics  . Smoking status: Never Smoker   . Smokeless tobacco: Not on file  . Alcohol Use: No    OB History   Grav Para Term Preterm Abortions TAB SAB Ect Mult Living                  Review of Systems  Constitutional: Negative for fever.  Gastrointestinal: Negative for nausea, vomiting, abdominal pain and bowel incontinence.  Neurological: Negative for tingling, loss of consciousness, numbness and headaches.  All other systems reviewed and are negative.    Allergies  Cephalexin; Morphine and related; Penicillins; Sinutab; Skelaxin; Tetanus toxoids; and Zestril  Home Medications   Current Outpatient Rx  Name  Route  Sig  Dispense  Refill  . acetaminophen (TYLENOL) 325 MG tablet   Oral   Take 650 mg by mouth  every 6 (six) hours as needed. For pain         . bisoprolol-hydrochlorothiazide (ZIAC) 5-6.25 MG per tablet   Oral   Take 1 tablet by mouth daily.   90 tablet   3   . colchicine 0.6 MG tablet   Oral   Take 1 tablet (0.6 mg total) by mouth 2 (two) times daily. Take BID for a week, then QD until Dr [name] sees.   60 tablet   0   . diazepam (VALIUM) 5 MG tablet   Oral   Take 1 tablet (5 mg total) by mouth daily as needed.   60 tablet   2   . diltiazem (CARDIZEM CD) 240 MG 24 hr capsule   Oral   Take 1 capsule (240 mg total) by mouth daily.   90 capsule   3   .  hydrochlorothiazide (HYDRODIURIL) 12.5 MG tablet               . levothyroxine (SYNTHROID, LEVOTHROID) 50 MCG tablet      Take 2 tablets twice weekly and 1 tablet 5 days a week   105 tablet   2   . predniSONE (DELTASONE) 5 MG tablet   Oral   Take 1 tablet (5 mg total) by mouth daily.   90 tablet   2     BP 124/48  Pulse 67  Temp(Src) 98.7 F (37.1 C) (Oral)  Resp 18  SpO2 93%  Physical Exam  Nursing note and vitals reviewed. Constitutional: She is oriented to person, place, and time. She appears well-developed and well-nourished. No distress.  HENT:  Head: Normocephalic and atraumatic.  Right Ear: External ear normal.  Left Ear: External ear normal.  Nose: Nose normal.  Mouth/Throat: Oropharynx is clear and moist.  Eyes: Conjunctivae and EOM are normal. Pupils are equal, round, and reactive to light.  Neck: Normal range of motion.  Cardiovascular: Normal rate, regular rhythm and normal heart sounds.   Pulmonary/Chest: Effort normal and breath sounds normal. No stridor. No respiratory distress. She has no wheezes. She has no rales.  Abdominal: Soft. She exhibits no distension.  Musculoskeletal: Normal range of motion. She exhibits tenderness.       Left hip: She exhibits tenderness and bony tenderness.  Neurological: She is alert and oriented to person, place, and time. She has normal strength.  Skin: Skin is warm and dry. She is not diaphoretic. No erythema.  Psychiatric: She has a normal mood and affect. Her behavior is normal.    ED Course  Procedures (including critical care time)  Labs Reviewed  CBC WITH DIFFERENTIAL - Abnormal; Notable for the following:    WBC 12.7 (*)    MCH 34.2 (*)    Neutro Abs 9.3 (*)    Monocytes Absolute 1.2 (*)    All other components within normal limits  BASIC METABOLIC PANEL - Abnormal; Notable for the following:    Glucose, Bld 102 (*)    Calcium 10.6 (*)    GFR calc non Af Amer 79 (*)    All other components within  normal limits  URINALYSIS, ROUTINE W REFLEX MICROSCOPIC   Dg Hip Complete Left  01/15/2013  *RADIOLOGY REPORT*  Clinical Data: Fall  LEFT HIP - COMPLETE 2+ VIEW  Comparison: 01/25/2006  Findings: Chronic healed left intertrochanteric fracture in satisfactory alignment.  Surgical fixation is unchanged from the prior study.  No acute fracture.  Chronic fracture right superior and inferior pubic rami.  IMPRESSION: No acute  fracture.   Original Report Authenticated By: Janeece Riggers, [name]    Ct Head Wo Contrast  01/15/2013  *RADIOLOGY REPORT*  Clinical Data: Increased confusion.  Fall.  CT HEAD WITHOUT CONTRAST  Technique:  Contiguous axial images were obtained from the base of the skull through the vertex without contrast.  Comparison: 06/06/2010  Findings: Generalized atrophy.  Moderate chronic microvascular ischemia in the white matter.  Negative for acute infarct. Negative for intracranial hemorrhage.  No skull fracture.  Chronic sinusitis on the left with prior surgery left maxillary sinus.  IMPRESSION: Atrophy and chronic microvascular ischemia.  No acute abnormality.   Original Report Authenticated By: Janeece Riggers, [name]     1. Fall, initial encounter   2. Left hip pain       MDM  Patient presents after a mechanical fall. Head CT shows no acute abnormality. She is having localized left hip pain. X-ray shows no fracture. Her hardware from surgery is intact. Labs within normal limits. Followup with your PCP this week. Strict return instructions given. Vital signs stable for discharge. Dr. Blinda Leatherwood evaluated patient and agrees with plan. Patient / Family / Caregiver informed of clinical course, understand medical decision-making process, and agree with plan.         Mora Bellman, PA-C 01/16/13 [phone]

## 2013-01-16 ENCOUNTER — Emergency Department (HOSPITAL_COMMUNITY): Payer: Medicare Other

## 2013-01-16 ENCOUNTER — Observation Stay (HOSPITAL_COMMUNITY)
Admission: EM | Admit: 2013-01-16 | Discharge: 2013-01-18 | Disposition: A | Discharge status: Skilled Nursing Facility | Payer: Medicare Other | Attending: Orthopaedic Surgery | Admitting: Orthopaedic Surgery

## 2013-01-16 ENCOUNTER — Encounter (HOSPITAL_COMMUNITY): Payer: Self-pay | Admitting: *Deleted

## 2013-01-16 DIAGNOSIS — Y92009 Unspecified place in unspecified non-institutional (private) residence as the place of occurrence of the external cause: Secondary | ICD-10-CM | POA: Insufficient documentation

## 2013-01-16 DIAGNOSIS — IMO0002 Reserved for concepts with insufficient information to code with codable children: Secondary | ICD-10-CM | POA: Insufficient documentation

## 2013-01-16 DIAGNOSIS — E039 Hypothyroidism, unspecified: Secondary | ICD-10-CM | POA: Insufficient documentation

## 2013-01-16 DIAGNOSIS — W19XXXA Unspecified fall, initial encounter: Secondary | ICD-10-CM | POA: Insufficient documentation

## 2013-01-16 DIAGNOSIS — S32599A Other specified fracture of unspecified pubis, initial encounter for closed fracture: Secondary | ICD-10-CM

## 2013-01-16 DIAGNOSIS — I10 Essential (primary) hypertension: Secondary | ICD-10-CM | POA: Insufficient documentation

## 2013-01-16 DIAGNOSIS — Z8781 Personal history of (healed) traumatic fracture: Secondary | ICD-10-CM | POA: Insufficient documentation

## 2013-01-16 DIAGNOSIS — Z7901 Long term (current) use of anticoagulants: Secondary | ICD-10-CM | POA: Insufficient documentation

## 2013-01-16 DIAGNOSIS — S32409A Unspecified fracture of unspecified acetabulum, initial encounter for closed fracture: Secondary | ICD-10-CM | POA: Insufficient documentation

## 2013-01-16 DIAGNOSIS — M810 Age-related osteoporosis without current pathological fracture: Secondary | ICD-10-CM | POA: Insufficient documentation

## 2013-01-16 DIAGNOSIS — S32509A Unspecified fracture of unspecified pubis, initial encounter for closed fracture: Principal | ICD-10-CM | POA: Insufficient documentation

## 2013-01-16 DIAGNOSIS — R269 Unspecified abnormalities of gait and mobility: Secondary | ICD-10-CM | POA: Insufficient documentation

## 2013-01-16 DIAGNOSIS — M25559 Pain in unspecified hip: Secondary | ICD-10-CM | POA: Insufficient documentation

## 2013-01-16 DIAGNOSIS — S32592A Other specified fracture of left pubis, initial encounter for closed fracture: Secondary | ICD-10-CM

## 2013-01-16 DIAGNOSIS — M1990 Unspecified osteoarthritis, unspecified site: Secondary | ICD-10-CM | POA: Insufficient documentation

## 2013-01-16 HISTORY — DX: Other specified fracture of unspecified pubis, initial encounter for closed fracture: S32.599A

## 2013-01-16 HISTORY — DX: Unspecified dementia, unspecified severity, without behavioral disturbance, psychotic disturbance, mood disturbance, and anxiety: F03.90

## 2013-01-16 LAB — PROTIME-INR: INR: 1.06 (ref 0.00–1.49)

## 2013-01-16 MED ORDER — METOCLOPRAMIDE HCL 5 MG/ML IJ SOLN: 5.0000 mg | Freq: Three times a day (TID) | INTRAMUSCULAR | Status: DC | PRN

## 2013-01-16 MED ORDER — ONDANSETRON HCL 4 MG/2ML IJ SOLN: 4.0000 mg | Freq: Four times a day (QID) | INTRAMUSCULAR | Status: DC | PRN

## 2013-01-16 MED ORDER — HYDROCODONE-ACETAMINOPHEN 5-325 MG PO TABS
2.0000 | ORAL_TABLET | Freq: Once | ORAL | Status: AC
  Administered 2013-01-16: 2 via ORAL
  Filled 2013-01-16: qty 2

## 2013-01-16 MED ORDER — LEVOTHYROXINE SODIUM 50 MCG PO TABS
50.0000 ug | ORAL_TABLET | ORAL | Status: DC
  Administered 2013-01-17 – 2013-01-18 (×2): 50 ug via ORAL
  Filled 2013-01-16 (×2): qty 1

## 2013-01-16 MED ORDER — DILTIAZEM HCL ER COATED BEADS 240 MG PO CP24
240.0000 mg | ORAL_CAPSULE | Freq: Every day | ORAL | Status: DC
  Administered 2013-01-16 – 2013-01-18 (×3): 240 mg via ORAL
  Filled 2013-01-16 (×3): qty 1

## 2013-01-16 MED ORDER — LEVOTHYROXINE SODIUM 50 MCG PO TABS: 50.0000 ug | ORAL_TABLET | ORAL | Status: DC

## 2013-01-16 MED ORDER — DEXTROSE-NACL 5-0.2 % IV SOLN
INTRAVENOUS | Status: DC
  Administered 2013-01-16: 16:00:00 via INTRAVENOUS

## 2013-01-16 MED ORDER — TRAMADOL HCL 50 MG PO TABS
50.0000 mg | ORAL_TABLET | Freq: Two times a day (BID) | ORAL | Status: DC
  Administered 2013-01-16 – 2013-01-18 (×4): 50 mg via ORAL
  Filled 2013-01-16 (×4): qty 1

## 2013-01-16 MED ORDER — FLEET ENEMA 7-19 GM/118ML RE ENEM: 1.0000 | ENEMA | Freq: Once | RECTAL | Status: AC | PRN

## 2013-01-16 MED ORDER — DOCUSATE SODIUM 100 MG PO CAPS
100.0000 mg | ORAL_CAPSULE | Freq: Two times a day (BID) | ORAL | Status: DC
  Administered 2013-01-16 – 2013-01-18 (×5): 100 mg via ORAL
  Filled 2013-01-16 (×5): qty 1

## 2013-01-16 MED ORDER — METOCLOPRAMIDE HCL 10 MG PO TABS: 5.0000 mg | ORAL_TABLET | Freq: Three times a day (TID) | ORAL | Status: DC | PRN

## 2013-01-16 MED ORDER — HYDROCHLOROTHIAZIDE 25 MG PO TABS
12.5000 mg | ORAL_TABLET | Freq: Every day | ORAL | Status: DC
Start: 2013-01-16 — End: 2013-01-16

## 2013-01-16 MED ORDER — BISACODYL 5 MG PO TBEC: 5.0000 mg | DELAYED_RELEASE_TABLET | Freq: Every day | ORAL | Status: DC | PRN

## 2013-01-16 MED ORDER — ONDANSETRON HCL 4 MG PO TABS: 4.0000 mg | ORAL_TABLET | Freq: Four times a day (QID) | ORAL | Status: DC | PRN

## 2013-01-16 MED ORDER — BISOPROLOL-HYDROCHLOROTHIAZIDE 5-6.25 MG PO TABS
1.0000 | ORAL_TABLET | Freq: Every day | ORAL | Status: DC
  Administered 2013-01-16 – 2013-01-18 (×3): 1 via ORAL
  Filled 2013-01-16 (×3): qty 1

## 2013-01-16 MED ORDER — ASPIRIN EC 81 MG PO TBEC
81.0000 mg | DELAYED_RELEASE_TABLET | Freq: Every day | ORAL | Status: DC
  Administered 2013-01-16 – 2013-01-18 (×3): 81 mg via ORAL
  Filled 2013-01-16 (×3): qty 1

## 2013-01-16 MED ORDER — HYDROMORPHONE HCL PF 1 MG/ML IJ SOLN: 0.5000 mg | INTRAMUSCULAR | Status: DC | PRN

## 2013-01-16 MED ORDER — LEVOTHYROXINE SODIUM 100 MCG PO TABS
100.0000 ug | ORAL_TABLET | ORAL | Status: DC
  Filled 2013-01-16: qty 1

## 2013-01-16 MED ORDER — DIAZEPAM 5 MG PO TABS
5.0000 mg | ORAL_TABLET | Freq: Every day | ORAL | Status: DC
  Administered 2013-01-16 – 2013-01-17 (×2): 5 mg via ORAL
  Filled 2013-01-16 (×2): qty 1

## 2013-01-16 MED ORDER — HYDROCHLOROTHIAZIDE 12.5 MG PO CAPS
12.5000 mg | ORAL_CAPSULE | Freq: Every day | ORAL | Status: DC
  Administered 2013-01-16 – 2013-01-17 (×2): 12.5 mg via ORAL
  Filled 2013-01-16 (×2): qty 1

## 2013-01-16 MED ORDER — PREDNISONE 5 MG PO TABS
5.0000 mg | ORAL_TABLET | Freq: Every day | ORAL | Status: DC
  Administered 2013-01-16 – 2013-01-17 (×2): 5 mg via ORAL
  Filled 2013-01-16 (×3): qty 1

## 2013-01-16 MED ORDER — HYDROCODONE-ACETAMINOPHEN 5-325 MG PO TABS
1.0000 | ORAL_TABLET | Freq: Four times a day (QID) | ORAL | Status: DC | PRN
  Administered 2013-01-17: 1 via ORAL
  Filled 2013-01-16: qty 1

## 2013-01-16 NOTE — ED Notes (Signed)
To ED for eval of hip pain. Pt was in ED yesterday for the same with negative xray and ct per ems. Per EMS, pt has not been able to move around per her norm.

## 2013-01-16 NOTE — ED Notes (Signed)
Norm Parcel, PA at bedside

## 2013-01-16 NOTE — ED Notes (Signed)
Pt being transferred to floor via Malachi Bonds, EMT

## 2013-01-16 NOTE — ED Provider Notes (Signed)
History     CSN: [id number]  Arrival date & time 01/16/13  0902   First MD Initiated Contact with Patient 01/16/13 [phone]      Chief Complaint  Patient presents with  . Hip Pain    (Consider location/radiation/quality/duration/timing/severity/associated sxs/prior treatment) HPI Comments: Patient presents today with left hip pain that has been present since yesterday.  Pain occurred after she fell and landed on her left hip while walking yesterday.  She was seen in the ED after the fall and had a xray done of the hip, which was negative.  She also had a head CT done, which was also negative.  She has been able to ambulate since the injury with her walker, but has had significant pain with ambulation.  She also has significant pain with ROM of the hip.  She has not taken anything for pain prior to arrival.  She denies any chest pain or SOB.  No headache.  No nausea or vomiting.  Her Orthopedist is Dr. Jerl Santos.  She has had a previous hip fracture and pelvic fracture in the past.  She is currently not on Coumadin.  Coumadin was stopped last week.    Patient is a 79 y.o. female presenting with hip pain. The history is provided by the patient.  Hip Pain This is a new problem. The current episode started yesterday. The problem occurs constantly. The problem has been unchanged. Pertinent negatives include no numbness. The symptoms are aggravated by walking.    Past Medical History  Diagnosis Date  . Hypertension   . Rheumatoid arteritis   . Phlebitis   . Deep vein thrombophlebitis of leg   . Fractured pelvis     fall 2011  . Thyroid disease     hypothyroidism  . Humerus fracture   . Thrombocytopenia   . Leukocytosis   . Hypothyroidism   . Phlebitis      Recurrent phlebitis  . Hematoma      Right superior and inferior pubic rami fractures with hematoma adjacent to the right ramus fracture  . Urinary tract infection   . Humerus fracture      Right proximal humerus fracture  .  Osteoarthritis     for which she takes chronic prednisone  . Macular degeneration   . Chronic anticoagulation     Past Surgical History  Procedure Laterality Date  . Left hip open reduction    . Cholecystectomy    . Abdominal hysterectomy  s  . Sinus surgery with instatrak      Family History  Problem Relation Age of Onset  . Abnormal EKG Neg Hx   . Abnormal newborn screen Neg Hx   . Achalasia Neg Hx   . Achondroplasia Neg Hx   . Acne Neg Hx   . Acromegaly Neg Hx   . Actinic keratosis Neg Hx   . Acute lymphoblastic leukemia Neg Hx   . Addison's disease Neg Hx   . Adrenal disorder Neg Hx   . Albinism Neg Hx   . Alcohol abuse Neg Hx   . Alkaptonuria Neg Hx   . Allergic rhinitis Neg Hx   . Allergies Neg Hx   . Allergy (severe) Neg Hx   . Alopecia Neg Hx   . Alpha-1 antitrypsin deficiency Neg Hx   . Alport syndrome Neg Hx   . ALS Neg Hx   . Alzheimer's disease Neg Hx   . Ambiguous genitalia Neg Hx   . Amblyopia Neg Hx   .  Amenorrhea Neg Hx   . Anal fissures Neg Hx   . Anemia Neg Hx   . Anesthesia problems Neg Hx   . Aneurysm Neg Hx   . Angelman syndrome Neg Hx   . Angina Neg Hx   . Angioedema Neg Hx   . Ankylosing spondylitis Neg Hx   . Anorectal malformation Neg Hx   . Anorexia nervosa Neg Hx   . Anti-cardiolipin syndrome Neg Hx   . Antithrombin III deficiency Neg Hx   . Anuerysm Neg Hx   . Anxiety disorder Neg Hx   . Aortic aneurysm Neg Hx   . Aortic dissection Neg Hx   . Aortic stenosis Neg Hx   . Aphthous stomatitis Neg Hx   . Aplastic anemia Neg Hx   . Appendicitis Neg Hx   . Apraxia Neg Hx   . Arnold-Chiari malformation Neg Hx   . Arrhythmia Neg Hx   . Arthritis Neg Hx   . Asperger's syndrome Neg Hx   . Asthma Neg Hx   . Ataxia Neg Hx   . Ataxia telangiectasia Neg Hx   . Atopy Neg Hx   . Atrial fibrillation Neg Hx   . Atrophic kidney Neg Hx   . Auditory processing disorder Neg Hx   . Autism Neg Hx   . Autism spectrum disorder Neg Hx   .  Autoimmune disease Neg Hx   . AVM Neg Hx   . Baker's cyst Neg Hx   . Barrett's esophagus Neg Hx   . Bartter's syndrome Neg Hx   . Basal cell carcinoma Neg Hx   . Behavior problems Neg Hx   . Bell's palsy Neg Hx   . Benign prostatic hyperplasia Neg Hx   . Bipolar disorder Neg Hx   . Birth defects Neg Hx   . Birth marks Neg Hx   . Blindness Neg Hx   . Bone cancer Neg Hx   . BOR syndrome Neg Hx   . Bow legs Neg Hx   . Bradycardia Neg Hx   . Brain cancer Neg Hx   . BRCA 1/2 Neg Hx   . Breast cancer Neg Hx   . Broken bones Neg Hx   . Bronchiolitis Neg Hx   . Bronchopulmonary dysplasia Neg Hx   . Bruton's disease Neg Hx   . Bulemia Neg Hx   . Bullous pemphigoid Neg Hx   . Bunion Neg Hx   . Bursitis Neg Hx   . Caf-au-lait spots Neg Hx   . Calcium disorder Neg Hx   . Canavan disease Neg Hx   . Cancer Neg Hx   . Cardiomyopathy Neg Hx   . Carpal tunnel syndrome Neg Hx   . Cataracts Neg Hx   . Celiac disease Neg Hx   . Cerebral aneurysm Neg Hx   . Cerebral palsy Neg Hx   . Cervical cancer Neg Hx   . Cervical polyp Neg Hx   . Cervicitis Neg Hx   . Chalasia Neg Hx   . Chalazion Neg Hx   . Charcot-Marie-Tooth disease Neg Hx   . Chediak-Higashi syndrome Neg Hx   . Chiari malformation Neg Hx   . Childhood respiratory disease Neg Hx   . Choanal atresia Neg Hx   . Cholecystitis Neg Hx   . Cholelithiasis Neg Hx   . Cholesteatoma Neg Hx   . Chondromalacia Neg Hx   . Chorea Neg Hx   . Chromosomal disorder Neg Hx   . Chronic  bronchitis Neg Hx   . Chronic fatigue Neg Hx   . Chronic granulomatous disease Neg Hx   . Chronic infections Neg Hx   . Cirrhosis Neg Hx   . Cleft lip Neg Hx   . Cleft palate Neg Hx   . Clotting disorder Neg Hx   . Club foot Neg Hx   . Coarctation of the aorta Neg Hx   . Colitis Neg Hx   . Collagen disease Neg Hx   . Colon cancer Neg Hx   . Colon polyps Neg Hx   . Colonic polyp Neg Hx   . Color blindness Neg Hx   . Conduct disorder Neg Hx   .  Conductive hearing loss Neg Hx   . Congenital adrenal hyperplasia Neg Hx   . Congenital heart disease Neg Hx   . Conjunctivitis Neg Hx   . Consanguinity Neg Hx   . Constipation Neg Hx   . COPD Neg Hx   . Corneal abrasion Neg Hx   . Corneal ulcer Neg Hx   . Coronary aneurysm Neg Hx   . Coronary artery disease Neg Hx   . Cowden syndrome Neg Hx   . Craniosynostosis Neg Hx   . Cri-du-chat syndrome Neg Hx   . Crohn's disease Neg Hx   . Cushing syndrome Neg Hx   . Cystic fibrosis Neg Hx   . Cystic kidney disease Neg Hx   . Cystinosis Neg Hx   . Decreased libido Neg Hx   . Deep vein thrombosis Neg Hx   . Delayed menopause Neg Hx   . Delayed puberty Neg Hx   . Dementia Neg Hx   . Dental caries Neg Hx   . Depression Neg Hx   . Dermatomyositis Neg Hx   . DES usage Neg Hx   . Developmental delay Neg Hx   . Diabetes Neg Hx   . Diabetes insipidus Neg Hx   . Diabetes type I Neg Hx   . Diabetes type II Neg Hx   . Diabetic kidney disease Neg Hx   . DiGeorge syndrome Neg Hx   . Dilated cardiomyopathy Neg Hx   . Dislocations Neg Hx   . Diverticulitis Neg Hx   . Diverticulosis Neg Hx   . Down syndrome Neg Hx   . Drug abuse Neg Hx   . Dupuytren's contracture Neg Hx   . Dwarfism Neg Hx   . Dysfunctional uterine bleeding Neg Hx   . Dysmenorrhea Neg Hx   . Dyspareunia Neg Hx   . Dysphagia Neg Hx   . Dysrhythmia Neg Hx   . Dystonia Neg Hx   . Early death Neg Hx   . Early menopause Neg Hx   . Early puberty Neg Hx   . Eating disorder Neg Hx   . Eclampsia Neg Hx   . Ectodermal dysplasia Neg Hx   . Eczema Neg Hx   . Edema Neg Hx   . Edward's syndrome Neg Hx   . Ehlers-Danlos syndrome Neg Hx   . Emotional abuse Neg Hx   . Emphysema Neg Hx   . Encopresis Neg Hx   . Endocrine tumor Neg Hx   . Endocrinopathy Neg Hx   . Endometrial cancer Neg Hx   . Endometriosis Neg Hx   . Enuresis Neg Hx   . Eosinophilic granuloma Neg Hx   . Epididymitis Neg Hx   . Erectile dysfunction Neg Hx     . Erythema nodosum Neg Hx   .  Esophageal cancer Neg Hx   . Esophageal varices Neg Hx   . Esophagitis Neg Hx   . Exostosis Neg Hx   . Fabry's disease Neg Hx   . Factor IX deficiency Neg Hx   . Factor V Leiden deficiency Neg Hx   . Factor VIII deficiency Neg Hx   . Failure to thrive Neg Hx   . Fainting Neg Hx   . Familial dysautonomia Neg Hx   . Familial nephritis Neg Hx   . Familial polyposis Neg Hx   . Fanconi anemia Neg Hx   . Febrile seizures Neg Hx   . Fibrocystic breast disease Neg Hx   . Fibroids Neg Hx   . Fibromyalgia Neg Hx   . Food intolerance Neg Hx   . Fragile X syndrome Neg Hx   . Friedreich's ataxia Neg Hx   . Frontotemporal dementia Neg Hx   . Fuch's dystrophy Neg Hx   . Gait disorder Neg Hx   . Galactosemia Neg Hx   . Gallbladder disease Neg Hx   . Gaucher's disease Neg Hx   . Genodermatoses Neg Hx   . GER disease Neg Hx   . Gestational diabetes Neg Hx   . GI problems Neg Hx   . Glaucoma Neg Hx   . Glomerulonephritis Neg Hx   . Glucose-6-phos deficiency Neg Hx   . Glycogen storage disease Neg Hx   . Goiter Neg Hx   . Gonadal disorder Neg Hx   . Gout Neg Hx   . Graves' disease Neg Hx   . Growth hormone deficiency Neg Hx   . GU problems Neg Hx   . Hammer toes Neg Hx   . Hartnup's disease Neg Hx   . Hashimoto's thyroiditis Neg Hx   . Healthy Neg Hx   . Hearing loss Neg Hx   . Heart block Neg Hx   . Heart defect Neg Hx   . Heart disease Neg Hx   . Heart failure Neg Hx   . Heart murmur Neg Hx   . Hemangiomas Neg Hx   . Hematuria Neg Hx   . Hemochromatosis Neg Hx   . Hemolytic uremic syndrome Neg Hx   . Hemophilia Neg Hx   . Henoch-Schonlein purpura Neg Hx   . Hepatitis Neg Hx   . Hepatomegaly Neg Hx   . Hereditary spherocytosis Neg Hx   . Hernia Neg Hx   . Hiatal hernia Neg Hx   . High arches Neg Hx   . Hip dysplasia Neg Hx   . Hip fracture Neg Hx   . Hirschsprung's disease Neg Hx   . Hirsutism Neg Hx   . Histiocytosis X Neg Hx   . HIV  Neg Hx   . HLA-B27 positive Neg Hx   . Hodgkin's lymphoma Neg Hx   . Homocystinuria Neg Hx   . Hunter's disease Neg Hx   . Huntington's disease Neg Hx   . Hydrocele Neg Hx   . Hydrocephalus Neg Hx   . Hygroma Neg Hx   . Hypercalcemia Neg Hx   . Hypereosinophilic syndrome Neg Hx   . Hyperinsulinemia Neg Hx   . Hyperkalemia Neg Hx   . Hyperlipidemia Neg Hx   . Hypermobility Neg Hx   . Hypernasality Neg Hx   . Hyperopia Neg Hx   . Hyperparathyroidism Neg Hx   . Hypersomnolence Neg Hx   . Hypertension Neg Hx   . Hyperthyroidism Neg Hx   . Hypertrophic cardiomyopathy  Neg Hx   . Hypokalemia Neg Hx   . Hypoparathyroidism Neg Hx   . Hypoplastic kidney Neg Hx   . Hypotension Neg Hx   . Hypothyroidism Neg Hx   . Idiopathic pulmonary fibrosis Neg Hx   . Idiopathic torsion dystonia Neg Hx   . IgA nephropathy Neg Hx   . Immunodeficiency Neg Hx   . Imperforate anus Neg Hx   . Impotence Neg Hx   . Impulse control disorder Neg Hx   . Incompetent cervix Neg Hx   . Infertility Neg Hx   . Inflammatory bowel disease Neg Hx   . Inguinal hernia Neg Hx   . Insomnia Neg Hx   . Insulin resistance Neg Hx   . Interstitial cystitis Neg Hx   . Intestinal malrotation Neg Hx   . Intestinal polyp Neg Hx   . Intracerebral hemorrhage Neg Hx   . Iron deficiency Neg Hx   . Irregular heart beat Neg Hx   . Irritable bowel syndrome Neg Hx   . Jaundice Neg Hx   . Job's syndrome Neg Hx   . Joint hypermobility Neg Hx   . Juvenile idiopathic arthritis Neg Hx   . Kartagener's syndrome Neg Hx   . Kawasaki disease Neg Hx   . Keloids Neg Hx   . Kennedy's disease Neg Hx   . Keratoconus Neg Hx   . Kidney cancer Neg Hx   . Kidney disease Neg Hx   . Kidney failure Neg Hx   . Kidney nephrosis Neg Hx   . Klinefelter's syndrome Neg Hx   . Krabbe disease Neg Hx   . Kyphosis Neg Hx   . Labyrinthitis Neg Hx   . Lactose intolerance Neg Hx   . Language disorder Neg Hx   . Lead poisoning Neg Hx   . Learning  disabilities Neg Hx   . Legg-Calve-Perthes disease Neg Hx   . Lesch-Nyhan syndrome Neg Hx   . Leukemia Neg Hx   . Lichen planus Neg Hx   . Li-Fraumeni syndrome Neg Hx   . Liver cancer Neg Hx   . Liver disease Neg Hx   . Long QT syndrome Neg Hx   . Lumbar disc disease Neg Hx   . Lung cancer Neg Hx   . Lung disease Neg Hx   . Lupus Neg Hx   . Lymphoma Neg Hx   . Macrocephaly Neg Hx   . Macrosomia Neg Hx   . Macular degeneration Neg Hx   . Malignant hypertension Neg Hx   . Malignant hyperthermia Neg Hx   . Maple syrup urine disease Neg Hx   . Marfan syndrome Neg Hx   . Mastocytosis Neg Hx   . Melanoma Neg Hx   . Memory loss Neg Hx   . Meniere's disease Neg Hx   . Menstrual problems Neg Hx   . Mental illness Neg Hx   . Mental retardation Neg Hx   . Metabolic syndrome Neg Hx   . Microcephaly Neg Hx   . Migraines Neg Hx   . Milk intolerance Neg Hx   . Miscarriages / Stillbirths Neg Hx   . Mitochondrial disorder Neg Hx   . Mitral valve prolapse Neg Hx   . Motor neuron disease Neg Hx   . Movement disorder Neg Hx   . Moyamoya disease Neg Hx   . Multiple births Neg Hx   . Multiple endocrine neoplasia Neg Hx   . Multiple fractures Neg Hx   .  Multiple myeloma Neg Hx   . Multiple sclerosis Neg Hx   . Muscle cancer Neg Hx   . Muscular dystrophy Neg Hx   . Myasthenia gravis Neg Hx   . Myelodysplastic syndrome Neg Hx   . Myocarditis Neg Hx   . Myoclonus Neg Hx   . Myopathy Neg Hx   . Nail disease Neg Hx   . Narcolepsy Neg Hx   . Nephritis Neg Hx   . Nephrolithiasis Neg Hx   . Nephrotic syndrome Neg Hx   . Neural tube defect Neg Hx   . Neurodegenerative disease Neg Hx   . Neurofibromatosis Neg Hx   . Neuromuscular disorder Neg Hx   . Neuropathy Neg Hx   . Neutropenia Neg Hx   . Nevi Neg Hx   . Niemann-Pick disease Neg Hx   . Night blindness Neg Hx   . Nocturnal enuresis Neg Hx   . Nystagmus Neg Hx   . Obesity Neg Hx   . OCD Neg Hx   . ODD Neg Hx   . Orchitis Neg Hx    . Osler-Weber-Rendu syndrome Neg Hx   . Osteoarthritis Neg Hx   . Osteochondroma Neg Hx   . Osteogenesis imperfecta Neg Hx   . Osteopenia Neg Hx   . Osteoporosis Neg Hx   . Osteosarcoma Neg Hx   . Osteosclerosis Neg Hx   . Other Neg Hx   . Otitis media Neg Hx   . Ovarian cancer Neg Hx   . Ovarian cysts Neg Hx   . Oxalosis Neg Hx   . Paget's disease of bone Neg Hx   . Pancreatic cancer Neg Hx   . Pancreatitis Neg Hx   . Panhypopituitarism Neg Hx   . Panic disorder Neg Hx   . Paranoid behavior Neg Hx   . Parasomnia Neg Hx   . Parkinsonism Neg Hx   . Patau's syndrome Neg Hx   . Pathological gambling Neg Hx   . PDD Neg Hx   . Pectus carinatum Neg Hx   . Pectus excavatum Neg Hx   . Pelvic inflammatory disease Neg Hx   . Pemphigus vulgaris Neg Hx   . Penile cancer Neg Hx   . Periodic limb movement Neg Hx   . Peripheral vascular disease Neg Hx   . Pernicious anemia Neg Hx   . Personality disorder Neg Hx   . Pes cavus Neg Hx   . Physical abuse Neg Hx   . Otilio Jefferson sequence Neg Hx   . PKU Neg Hx   . Plantar fasciitis Neg Hx   . Pleurisy Neg Hx   . Pneumonia Neg Hx   . Polychondritis Neg Hx   . Polycystic kidney disease Neg Hx   . Polycystic ovary syndrome Neg Hx   . Polycythemia Neg Hx   . Polymyalgia rheumatica Neg Hx   . Polymyositis Neg Hx   . Pompe disease Neg Hx   . Positive PPD/TB Exposure Neg Hx   . Posterior urethral valves Neg Hx   . Post-traumatic stress disorder Neg Hx   . Prader-Willi syndrome Neg Hx   . Premature birth Neg Hx   . Premature ovarian failure Neg Hx   . Preterm labor Neg Hx   . Prolactinoma Neg Hx   . Prostate cancer Neg Hx   . Prostatitis Neg Hx   . Protein C deficiency Neg Hx   . Protein S deficiency Neg Hx   . Proteinuria Neg Hx   .  Prune belly syndrome Neg Hx   . Pruritis Neg Hx   . Pseudochol deficiency Neg Hx   . Pseudotumor cerebri Neg Hx   . Psoriasis Neg Hx   . Psychosis Neg Hx   . Ptosis Neg Hx   . Pulmonary embolism  Neg Hx   . Pulmonary fibrosis Neg Hx   . Pyelonephritis Neg Hx   . Pyloric stenosis Neg Hx   . Pyruvate dehydrogenase deficiency Neg Hx   . Rashes / Skin problems Neg Hx   . Raynaud syndrome Neg Hx   . Rectal cancer Neg Hx   . Recurrent abdominal pain Neg Hx   . Reiter's syndrome Neg Hx   . Renal tubular acidosis Neg Hx   . Restless legs syndrome Neg Hx   . Retinal degeneration Neg Hx   . Retinal detachment Neg Hx   . Retinitis pigmentosa Neg Hx   . Retinoblastoma Neg Hx   . Retinopathy of prematurity Neg Hx   . Reye's syndrome Neg Hx   . Rheum arthritis Neg Hx   . Rheumatic fever Neg Hx   . Rheumatologic disease Neg Hx   . Rickets Neg Hx   . Rosacea Neg Hx   . Sacroiliitis Neg Hx   . Sarcoidosis Neg Hx   . Schizophrenia Neg Hx   . Scleritis Neg Hx   . Scleroderma Neg Hx   . Scoliosis Neg Hx   . Seasonal affective disorder Neg Hx   . Seizures Neg Hx   . Selective mutism Neg Hx   . Sensorineural hearing loss Neg Hx   . Severe combined immunodeficiency Neg Hx   . Severe sprains Neg Hx   . Sexual abuse Neg Hx   . Short stature Neg Hx   . Sick sinus syndrome Neg Hx   . Sickle cell anemia Neg Hx   . Sickle cell trait Neg Hx   . SIDS Neg Hx   . Single kidney Neg Hx   . Sinusitis Neg Hx   . Sjogren's syndrome Neg Hx   . Skeletal dysplasia Neg Hx   . Skin cancer Neg Hx   . Skin telangiectasia Neg Hx   . Sleep apnea Neg Hx   . Sleep disorder Neg Hx   . Sleep walking Neg Hx   . Snoring Neg Hx   . Social phobia Neg Hx   . Speech disorder Neg Hx   . Spina bifida Neg Hx   . Spinal muscular atrophy Neg Hx   . Splenomegaly Neg Hx   . Spondyloarthropathy Neg Hx   . Spondylolisthesis Neg Hx   . Spondylolysis Neg Hx   . Squamous cell carcinoma Neg Hx   . Stevens-Johnson syndrome Neg Hx   . Stickler syndrome Neg Hx   . Stomach cancer Neg Hx   . Strabismus Neg Hx   . Stroke Neg Hx   . Stuttering Neg Hx   . Subarachnoid hemorrhage Neg Hx   . Sudden death Neg Hx   .  Suicidality Neg Hx   . Supraventricular tachycardia Neg Hx   . Swallowing difficulties Neg Hx   . Tall stature Neg Hx   . Tay-Sachs disease Neg Hx   . Testicular cancer Neg Hx   . Thalassemia Neg Hx   . Throat cancer Neg Hx   . Thrombocytopenia Neg Hx   . Thrombophilia Neg Hx   . Thrombophlebitis Neg Hx   . Thrombosis Neg Hx   . Thymic aplasia Neg Hx   .  Thyroid cancer Neg Hx   . Thyroid disease Neg Hx   . Thyroid nodules Neg Hx   . Tics Neg Hx   . Tongue cancer Neg Hx   . Torticollis Neg Hx   . Tourette syndrome Neg Hx   . Tracheal cancer Neg Hx   . Tracheoesophageal fistual Neg Hx   . Tracheomalacia Neg Hx   . Transient ischemic attack Neg Hx   . Treacher Collins syndrome Neg Hx   . Tremor Neg Hx   . Tuberculosis Neg Hx   . Tuberous sclerosis Neg Hx   . Turner syndrome Neg Hx   . Ulcerative colitis Neg Hx   . Ulcers Neg Hx   . Undescended testes Neg Hx   . Unexplained death Neg Hx   . Urinary tract infection Neg Hx   . Urolithiasis Neg Hx   . Urticaria Neg Hx   . Uterine cancer Neg Hx   . Uveitis Neg Hx   . Vaginal cancer Neg Hx   . Valvular heart disease Neg Hx   . Vasculitis Neg Hx   . Velocardiofacial syndrome Neg Hx   . Venous thrombosis Neg Hx   . Verruca vulgaris  Neg Hx   . Vesicoureteral reflux Neg Hx   . Vision loss Neg Hx   . Vitamin D deficiency Neg Hx   . Vitiligo Neg Hx   . Voice disorder Neg Hx   . Von Gierke's disease Neg Hx   . Von Hippel-Lindau syndrome Neg Hx   . Von Willebrand disease Neg Hx   . Werdnig Hoffman Disease Neg Hx   . Williams syndrome Neg Hx   . Wilm's tumor Neg Hx   . Wilson's disease Neg Hx   . Wiskott-Aldrich syndrome Neg Hx   . Evelene Croon Parkinson White syndrome Neg Hx   . Xeroderma pigmentosa Neg Hx   . Heart attack Mother   . Heart attack Father     History  Substance Use Topics  . Smoking status: Never Smoker   . Smokeless tobacco: Not on file  . Alcohol Use: No    OB History   Grav Para Term Preterm Abortions  TAB SAB Ect Mult Living                  Review of Systems  Musculoskeletal: Positive for gait problem.       Left hip pain  Neurological: Negative for numbness.  All other systems reviewed and are negative.    Allergies  Morphine and related and Penicillins  Home Medications   Current Outpatient Rx  Name  Route  Sig  Dispense  Refill  . aspirin EC 81 MG tablet   Oral   Take 81 mg by mouth daily.         . bisoprolol-hydrochlorothiazide (ZIAC) 5-6.25 MG per tablet   Oral   Take 1 tablet by mouth daily.         . diazepam (VALIUM) 5 MG tablet   Oral   Take 5 mg by mouth at bedtime.         Marland Kitchen diltiazem (CARDIZEM CD) 240 MG 24 hr capsule   Oral   Take 240 mg by mouth daily.         . hydrochlorothiazide (HYDRODIURIL) 12.5 MG tablet   Oral   Take 12.5 mg by mouth daily.          Marland Kitchen levothyroxine (SYNTHROID, LEVOTHROID) 50 MCG tablet   Oral   Take 50-100  mcg by mouth See admin instructions. Takes 1 tablet daily except on Monday and Friday when she takes 2 tablets         . predniSONE (DELTASONE) 5 MG tablet   Oral   Take 5 mg by mouth daily.         . traMADol (ULTRAM) 50 MG tablet   Oral   Take 50 mg by mouth 2 (two) times daily.           BP 120/52  Pulse 77  Temp(Src) 98.6 F (37 C) (Oral)  Resp 16  SpO2 95%  Physical Exam  Nursing note and vitals reviewed. Constitutional: She appears well-developed and well-nourished.  HENT:  Head: Normocephalic and atraumatic.  Mouth/Throat: Oropharynx is clear and moist.  Eyes: EOM are normal. Pupils are equal, round, and reactive to light.  Neck: Normal range of motion. Neck supple.  Cardiovascular: Normal rate, regular rhythm and normal heart sounds.   Pulses:      Dorsalis pedis pulses are 2+ on the right side, and 2+ on the left side.  Pulmonary/Chest: Effort normal and breath sounds normal. No respiratory distress. She has no wheezes.  Musculoskeletal:       Left hip: She exhibits  decreased range of motion, tenderness and bony tenderness. She exhibits no swelling and no deformity.  Neurological: She is alert.  Distal sensation of left foot intact  Skin: Skin is warm and dry.     Psychiatric: She has a normal mood and affect.    ED Course  Procedures (including critical care time)  Labs Reviewed - No data to display Dg Hip Complete Left  01/15/2013  *RADIOLOGY REPORT*  Clinical Data: Fall  LEFT HIP - COMPLETE 2+ VIEW  Comparison: 01/25/2006  Findings: Chronic healed left intertrochanteric fracture in satisfactory alignment.  Surgical fixation is unchanged from the prior study.  No acute fracture.  Chronic fracture right superior and inferior pubic rami.  IMPRESSION: No acute fracture.   Original Report Authenticated By: Janeece Riggers, [name]    Ct Head Wo Contrast  01/15/2013  *RADIOLOGY REPORT*  Clinical Data: Increased confusion.  Fall.  CT HEAD WITHOUT CONTRAST  Technique:  Contiguous axial images were obtained from the base of the skull through the vertex without contrast.  Comparison: 06/06/2010  Findings: Generalized atrophy.  Moderate chronic microvascular ischemia in the white matter.  Negative for acute infarct. Negative for intracranial hemorrhage.  No skull fracture.  Chronic sinusitis on the left with prior surgery left maxillary sinus.  IMPRESSION: Atrophy and chronic microvascular ischemia.  No acute abnormality.   Original Report Authenticated By: Janeece Riggers, [name]    Ct Hip Left Wo Contrast  01/16/2013  *RADIOLOGY REPORT*  Clinical Data: Left hip pain.  Fall.  CT OF THE LEFT HIP WITHOUT CONTRAST  Technique:  Multidetector CT imaging was performed according to the standard protocol. Multiplanar CT image reconstructions were also generated.  Comparison: Plain films 01/15/2013.  Findings: There is partial visualization of a nondisplaced left parasymphyseal pubic bone fracture which appears acute.  Also seen is an acute fracture of the anterior wall of the left  acetabulum. As seen on plain films, hardware fixes an old healed left intertrochanteric fracture.  Hardware is intact and no complicating feature is identified.  There is no evidence of avascular necrosis of the left femoral head.  Moderate to advanced left hip degenerative disease is seen with joint space narrowing and osteophytosis present. No hip joint effusion is identified.  Musculature about the  left hip appears normal.  Imaged intrapelvic contents demonstrate no focal abnormality.  IMPRESSION:  1.  Acute, nondisplaced left parasymphyseal pubic bone fracture. There is also an acute, nondisplaced fracture the anterior wall of the left acetabulum. 2.  Old healed left intertrochanteric fracture with fixation hardware in place. 3.  Marked left hip degenerative disease.   Original Report Authenticated By: Holley Dexter, [name]      No diagnosis found.  12:05 PM Spoke with Dr. Jerl Santos.  He reports that he will come see the patient in the ED.  MDM  Patient presenting with left hip pain that has been present since yesterday after a fall.  Xray performed yesterday did not show fracture.  However, since the patient continued to have pain a CT of the hip was ordered.  CT showed left parasymphyseal pubic bone fracture and also anterior wall of the left acetabulum.  Patient neurovascularly intact.  Dr. Jerl Santos with Orthopedics consulted and agreed to admit the patient.        Pascal Lux [place], PA-C 01/16/13 1554

## 2013-01-16 NOTE — ED Notes (Signed)
Family at bedside. 

## 2013-01-16 NOTE — Progress Notes (Signed)
Orthopedic Tech Progress Note Patient Details:  [facility] 20-Jun-1929 [id number] Applied overhead frame to bed.     Jennye Moccasin 01/16/2013, 4:26 PM

## 2013-01-16 NOTE — ED Notes (Signed)
Report called to floor and given to Rote, Charity fundraiser. Nurse has no further questions upon repot. Pt being prepared for transport

## 2013-01-16 NOTE — H&P (Signed)
Reason for Consult:   Pelvic fracture and fall Referring Physician:    Adriana Simas EDP  Marissa Parsons is an 78 y.o. female who I know well. We have followed her for years for ortho issues.  She fell yesterday coming in from her porch and came to ED.  Xrays of the hip were normal and she was sent home.  Today things are worse and she couldn't stand.  Came back to ED and Dr Adriana Simas alertly ordered CT of the hip and pelvis.  This has revealed nondisplaced pelvic and acetabular fractures.  Denies antecedent CP and LOC.   Past Medical History  Diagnosis Date  . Hypertension   . Rheumatoid arteritis   . Phlebitis   . Deep vein thrombophlebitis of leg   . Fractured pelvis     fall 2011  . Thyroid disease     hypothyroidism  . Humerus fracture   . Thrombocytopenia   . Leukocytosis   . Hypothyroidism   . Phlebitis      Recurrent phlebitis  . Hematoma      Right superior and inferior pubic rami fractures with hematoma adjacent to the right ramus fracture  . Urinary tract infection   . Humerus fracture      Right proximal humerus fracture  . Osteoarthritis     for which she takes chronic prednisone  . Macular degeneration   . Chronic anticoagulation     Past Surgical History  Procedure Laterality Date  . Left hip open reduction    . Cholecystectomy    . Abdominal hysterectomy  s  . Sinus surgery with instatrak      Family History  Problem Relation Age of Onset  . Abnormal EKG Neg Hx   . Abnormal newborn screen Neg Hx   . Achalasia Neg Hx   . Achondroplasia Neg Hx   . Acne Neg Hx   . Acromegaly Neg Hx   . Actinic keratosis Neg Hx   . Acute lymphoblastic leukemia Neg Hx   . Addison's disease Neg Hx   . Adrenal disorder Neg Hx   . Albinism Neg Hx   . Alcohol abuse Neg Hx   . Alkaptonuria Neg Hx   . Allergic rhinitis Neg Hx   . Allergies Neg Hx   . Allergy (severe) Neg Hx   . Alopecia Neg Hx   . Alpha-1 antitrypsin deficiency Neg Hx   . Alport syndrome Neg Hx   . ALS Neg Hx    . Alzheimer's disease Neg Hx   . Ambiguous genitalia Neg Hx   . Amblyopia Neg Hx   . Amenorrhea Neg Hx   . Anal fissures Neg Hx   . Anemia Neg Hx   . Anesthesia problems Neg Hx   . Aneurysm Neg Hx   . Angelman syndrome Neg Hx   . Angina Neg Hx   . Angioedema Neg Hx   . Ankylosing spondylitis Neg Hx   . Anorectal malformation Neg Hx   . Anorexia nervosa Neg Hx   . Anti-cardiolipin syndrome Neg Hx   . Antithrombin III deficiency Neg Hx   . Anuerysm Neg Hx   . Anxiety disorder Neg Hx   . Aortic aneurysm Neg Hx   . Aortic dissection Neg Hx   . Aortic stenosis Neg Hx   . Aphthous stomatitis Neg Hx   . Aplastic anemia Neg Hx   . Appendicitis Neg Hx   . Apraxia Neg Hx   . Arnold-Chiari malformation Neg Hx   .  Arrhythmia Neg Hx   . Arthritis Neg Hx   . Asperger's syndrome Neg Hx   . Asthma Neg Hx   . Ataxia Neg Hx   . Ataxia telangiectasia Neg Hx   . Atopy Neg Hx   . Atrial fibrillation Neg Hx   . Atrophic kidney Neg Hx   . Auditory processing disorder Neg Hx   . Autism Neg Hx   . Autism spectrum disorder Neg Hx   . Autoimmune disease Neg Hx   . AVM Neg Hx   . Baker's cyst Neg Hx   . Barrett's esophagus Neg Hx   . Bartter's syndrome Neg Hx   . Basal cell carcinoma Neg Hx   . Behavior problems Neg Hx   . Bell's palsy Neg Hx   . Benign prostatic hyperplasia Neg Hx   . Bipolar disorder Neg Hx   . Birth defects Neg Hx   . Birth marks Neg Hx   . Blindness Neg Hx   . Bone cancer Neg Hx   . BOR syndrome Neg Hx   . Bow legs Neg Hx   . Bradycardia Neg Hx   . Brain cancer Neg Hx   . BRCA 1/2 Neg Hx   . Breast cancer Neg Hx   . Broken bones Neg Hx   . Bronchiolitis Neg Hx   . Bronchopulmonary dysplasia Neg Hx   . Bruton's disease Neg Hx   . Bulemia Neg Hx   . Bullous pemphigoid Neg Hx   . Bunion Neg Hx   . Bursitis Neg Hx   . Caf-au-lait spots Neg Hx   . Calcium disorder Neg Hx   . Canavan disease Neg Hx   . Cancer Neg Hx   . Cardiomyopathy Neg Hx   . Carpal  tunnel syndrome Neg Hx   . Cataracts Neg Hx   . Celiac disease Neg Hx   . Cerebral aneurysm Neg Hx   . Cerebral palsy Neg Hx   . Cervical cancer Neg Hx   . Cervical polyp Neg Hx   . Cervicitis Neg Hx   . Chalasia Neg Hx   . Chalazion Neg Hx   . Charcot-Marie-Tooth disease Neg Hx   . Chediak-Higashi syndrome Neg Hx   . Chiari malformation Neg Hx   . Childhood respiratory disease Neg Hx   . Choanal atresia Neg Hx   . Cholecystitis Neg Hx   . Cholelithiasis Neg Hx   . Cholesteatoma Neg Hx   . Chondromalacia Neg Hx   . Chorea Neg Hx   . Chromosomal disorder Neg Hx   . Chronic bronchitis Neg Hx   . Chronic fatigue Neg Hx   . Chronic granulomatous disease Neg Hx   . Chronic infections Neg Hx   . Cirrhosis Neg Hx   . Cleft lip Neg Hx   . Cleft palate Neg Hx   . Clotting disorder Neg Hx   . Club foot Neg Hx   . Coarctation of the aorta Neg Hx   . Colitis Neg Hx   . Collagen disease Neg Hx   . Colon cancer Neg Hx   . Colon polyps Neg Hx   . Colonic polyp Neg Hx   . Color blindness Neg Hx   . Conduct disorder Neg Hx   . Conductive hearing loss Neg Hx   . Congenital adrenal hyperplasia Neg Hx   . Congenital heart disease Neg Hx   . Conjunctivitis Neg Hx   . Consanguinity Neg Hx   .  Constipation Neg Hx   . COPD Neg Hx   . Corneal abrasion Neg Hx   . Corneal ulcer Neg Hx   . Coronary aneurysm Neg Hx   . Coronary artery disease Neg Hx   . Cowden syndrome Neg Hx   . Craniosynostosis Neg Hx   . Cri-du-chat syndrome Neg Hx   . Crohn's disease Neg Hx   . Cushing syndrome Neg Hx   . Cystic fibrosis Neg Hx   . Cystic kidney disease Neg Hx   . Cystinosis Neg Hx   . Decreased libido Neg Hx   . Deep vein thrombosis Neg Hx   . Delayed menopause Neg Hx   . Delayed puberty Neg Hx   . Dementia Neg Hx   . Dental caries Neg Hx   . Depression Neg Hx   . Dermatomyositis Neg Hx   . DES usage Neg Hx   . Developmental delay Neg Hx   . Diabetes Neg Hx   . Diabetes insipidus Neg Hx    . Diabetes type I Neg Hx   . Diabetes type II Neg Hx   . Diabetic kidney disease Neg Hx   . DiGeorge syndrome Neg Hx   . Dilated cardiomyopathy Neg Hx   . Dislocations Neg Hx   . Diverticulitis Neg Hx   . Diverticulosis Neg Hx   . Down syndrome Neg Hx   . Drug abuse Neg Hx   . Dupuytren's contracture Neg Hx   . Dwarfism Neg Hx   . Dysfunctional uterine bleeding Neg Hx   . Dysmenorrhea Neg Hx   . Dyspareunia Neg Hx   . Dysphagia Neg Hx   . Dysrhythmia Neg Hx   . Dystonia Neg Hx   . Early death Neg Hx   . Early menopause Neg Hx   . Early puberty Neg Hx   . Eating disorder Neg Hx   . Eclampsia Neg Hx   . Ectodermal dysplasia Neg Hx   . Eczema Neg Hx   . Edema Neg Hx   . Edward's syndrome Neg Hx   . Ehlers-Danlos syndrome Neg Hx   . Emotional abuse Neg Hx   . Emphysema Neg Hx   . Encopresis Neg Hx   . Endocrine tumor Neg Hx   . Endocrinopathy Neg Hx   . Endometrial cancer Neg Hx   . Endometriosis Neg Hx   . Enuresis Neg Hx   . Eosinophilic granuloma Neg Hx   . Epididymitis Neg Hx   . Erectile dysfunction Neg Hx   . Erythema nodosum Neg Hx   . Esophageal cancer Neg Hx   . Esophageal varices Neg Hx   . Esophagitis Neg Hx   . Exostosis Neg Hx   . Fabry's disease Neg Hx   . Factor IX deficiency Neg Hx   . Factor V Leiden deficiency Neg Hx   . Factor VIII deficiency Neg Hx   . Failure to thrive Neg Hx   . Fainting Neg Hx   . Familial dysautonomia Neg Hx   . Familial nephritis Neg Hx   . Familial polyposis Neg Hx   . Fanconi anemia Neg Hx   . Febrile seizures Neg Hx   . Fibrocystic breast disease Neg Hx   . Fibroids Neg Hx   . Fibromyalgia Neg Hx   . Food intolerance Neg Hx   . Fragile X syndrome Neg Hx   . Friedreich's ataxia Neg Hx   . Frontotemporal dementia Neg Hx   .  Fuch's dystrophy Neg Hx   . Gait disorder Neg Hx   . Galactosemia Neg Hx   . Gallbladder disease Neg Hx   . Gaucher's disease Neg Hx   . Genodermatoses Neg Hx   . GER disease Neg Hx   .  Gestational diabetes Neg Hx   . GI problems Neg Hx   . Glaucoma Neg Hx   . Glomerulonephritis Neg Hx   . Glucose-6-phos deficiency Neg Hx   . Glycogen storage disease Neg Hx   . Goiter Neg Hx   . Gonadal disorder Neg Hx   . Gout Neg Hx   . Graves' disease Neg Hx   . Growth hormone deficiency Neg Hx   . GU problems Neg Hx   . Hammer toes Neg Hx   . Hartnup's disease Neg Hx   . Hashimoto's thyroiditis Neg Hx   . Healthy Neg Hx   . Hearing loss Neg Hx   . Heart block Neg Hx   . Heart defect Neg Hx   . Heart disease Neg Hx   . Heart failure Neg Hx   . Heart murmur Neg Hx   . Hemangiomas Neg Hx   . Hematuria Neg Hx   . Hemochromatosis Neg Hx   . Hemolytic uremic syndrome Neg Hx   . Hemophilia Neg Hx   . Henoch-Schonlein purpura Neg Hx   . Hepatitis Neg Hx   . Hepatomegaly Neg Hx   . Hereditary spherocytosis Neg Hx   . Hernia Neg Hx   . Hiatal hernia Neg Hx   . High arches Neg Hx   . Hip dysplasia Neg Hx   . Hip fracture Neg Hx   . Hirschsprung's disease Neg Hx   . Hirsutism Neg Hx   . Histiocytosis X Neg Hx   . HIV Neg Hx   . HLA-B27 positive Neg Hx   . Hodgkin's lymphoma Neg Hx   . Homocystinuria Neg Hx   . Hunter's disease Neg Hx   . Huntington's disease Neg Hx   . Hydrocele Neg Hx   . Hydrocephalus Neg Hx   . Hygroma Neg Hx   . Hypercalcemia Neg Hx   . Hypereosinophilic syndrome Neg Hx   . Hyperinsulinemia Neg Hx   . Hyperkalemia Neg Hx   . Hyperlipidemia Neg Hx   . Hypermobility Neg Hx   . Hypernasality Neg Hx   . Hyperopia Neg Hx   . Hyperparathyroidism Neg Hx   . Hypersomnolence Neg Hx   . Hypertension Neg Hx   . Hyperthyroidism Neg Hx   . Hypertrophic cardiomyopathy Neg Hx   . Hypokalemia Neg Hx   . Hypoparathyroidism Neg Hx   . Hypoplastic kidney Neg Hx   . Hypotension Neg Hx   . Hypothyroidism Neg Hx   . Idiopathic pulmonary fibrosis Neg Hx   . Idiopathic torsion dystonia Neg Hx   . IgA nephropathy Neg Hx   . Immunodeficiency Neg Hx   .  Imperforate anus Neg Hx   . Impotence Neg Hx   . Impulse control disorder Neg Hx   . Incompetent cervix Neg Hx   . Infertility Neg Hx   . Inflammatory bowel disease Neg Hx   . Inguinal hernia Neg Hx   . Insomnia Neg Hx   . Insulin resistance Neg Hx   . Interstitial cystitis Neg Hx   . Intestinal malrotation Neg Hx   . Intestinal polyp Neg Hx   . Intracerebral hemorrhage Neg Hx   .  Iron deficiency Neg Hx   . Irregular heart beat Neg Hx   . Irritable bowel syndrome Neg Hx   . Jaundice Neg Hx   . Job's syndrome Neg Hx   . Joint hypermobility Neg Hx   . Juvenile idiopathic arthritis Neg Hx   . Kartagener's syndrome Neg Hx   . Kawasaki disease Neg Hx   . Keloids Neg Hx   . Kennedy's disease Neg Hx   . Keratoconus Neg Hx   . Kidney cancer Neg Hx   . Kidney disease Neg Hx   . Kidney failure Neg Hx   . Kidney nephrosis Neg Hx   . Klinefelter's syndrome Neg Hx   . Krabbe disease Neg Hx   . Kyphosis Neg Hx   . Labyrinthitis Neg Hx   . Lactose intolerance Neg Hx   . Language disorder Neg Hx   . Lead poisoning Neg Hx   . Learning disabilities Neg Hx   . Legg-Calve-Perthes disease Neg Hx   . Lesch-Nyhan syndrome Neg Hx   . Leukemia Neg Hx   . Lichen planus Neg Hx   . Li-Fraumeni syndrome Neg Hx   . Liver cancer Neg Hx   . Liver disease Neg Hx   . Long QT syndrome Neg Hx   . Lumbar disc disease Neg Hx   . Lung cancer Neg Hx   . Lung disease Neg Hx   . Lupus Neg Hx   . Lymphoma Neg Hx   . Macrocephaly Neg Hx   . Macrosomia Neg Hx   . Macular degeneration Neg Hx   . Malignant hypertension Neg Hx   . Malignant hyperthermia Neg Hx   . Maple syrup urine disease Neg Hx   . Marfan syndrome Neg Hx   . Mastocytosis Neg Hx   . Melanoma Neg Hx   . Memory loss Neg Hx   . Meniere's disease Neg Hx   . Menstrual problems Neg Hx   . Mental illness Neg Hx   . Mental retardation Neg Hx   . Metabolic syndrome Neg Hx   . Microcephaly Neg Hx   . Migraines Neg Hx   . Milk intolerance  Neg Hx   . Miscarriages / Stillbirths Neg Hx   . Mitochondrial disorder Neg Hx   . Mitral valve prolapse Neg Hx   . Motor neuron disease Neg Hx   . Movement disorder Neg Hx   . Moyamoya disease Neg Hx   . Multiple births Neg Hx   . Multiple endocrine neoplasia Neg Hx   . Multiple fractures Neg Hx   . Multiple myeloma Neg Hx   . Multiple sclerosis Neg Hx   . Muscle cancer Neg Hx   . Muscular dystrophy Neg Hx   . Myasthenia gravis Neg Hx   . Myelodysplastic syndrome Neg Hx   . Myocarditis Neg Hx   . Myoclonus Neg Hx   . Myopathy Neg Hx   . Nail disease Neg Hx   . Narcolepsy Neg Hx   . Nephritis Neg Hx   . Nephrolithiasis Neg Hx   . Nephrotic syndrome Neg Hx   . Neural tube defect Neg Hx   . Neurodegenerative disease Neg Hx   . Neurofibromatosis Neg Hx   . Neuromuscular disorder Neg Hx   . Neuropathy Neg Hx   . Neutropenia Neg Hx   . Nevi Neg Hx   . Niemann-Pick disease Neg Hx   . Night blindness Neg Hx   . Nocturnal enuresis  Neg Hx   . Nystagmus Neg Hx   . Obesity Neg Hx   . OCD Neg Hx   . ODD Neg Hx   . Orchitis Neg Hx   . Osler-Weber-Rendu syndrome Neg Hx   . Osteoarthritis Neg Hx   . Osteochondroma Neg Hx   . Osteogenesis imperfecta Neg Hx   . Osteopenia Neg Hx   . Osteoporosis Neg Hx   . Osteosarcoma Neg Hx   . Osteosclerosis Neg Hx   . Other Neg Hx   . Otitis media Neg Hx   . Ovarian cancer Neg Hx   . Ovarian cysts Neg Hx   . Oxalosis Neg Hx   . Paget's disease of bone Neg Hx   . Pancreatic cancer Neg Hx   . Pancreatitis Neg Hx   . Panhypopituitarism Neg Hx   . Panic disorder Neg Hx   . Paranoid behavior Neg Hx   . Parasomnia Neg Hx   . Parkinsonism Neg Hx   . Patau's syndrome Neg Hx   . Pathological gambling Neg Hx   . PDD Neg Hx   . Pectus carinatum Neg Hx   . Pectus excavatum Neg Hx   . Pelvic inflammatory disease Neg Hx   . Pemphigus vulgaris Neg Hx   . Penile cancer Neg Hx   . Periodic limb movement Neg Hx   . Peripheral vascular disease  Neg Hx   . Pernicious anemia Neg Hx   . Personality disorder Neg Hx   . Pes cavus Neg Hx   . Physical abuse Neg Hx   . Otilio Jefferson sequence Neg Hx   . PKU Neg Hx   . Plantar fasciitis Neg Hx   . Pleurisy Neg Hx   . Pneumonia Neg Hx   . Polychondritis Neg Hx   . Polycystic kidney disease Neg Hx   . Polycystic ovary syndrome Neg Hx   . Polycythemia Neg Hx   . Polymyalgia rheumatica Neg Hx   . Polymyositis Neg Hx   . Pompe disease Neg Hx   . Positive PPD/TB Exposure Neg Hx   . Posterior urethral valves Neg Hx   . Post-traumatic stress disorder Neg Hx   . Prader-Willi syndrome Neg Hx   . Premature birth Neg Hx   . Premature ovarian failure Neg Hx   . Preterm labor Neg Hx   . Prolactinoma Neg Hx   . Prostate cancer Neg Hx   . Prostatitis Neg Hx   . Protein C deficiency Neg Hx   . Protein S deficiency Neg Hx   . Proteinuria Neg Hx   . Prune belly syndrome Neg Hx   . Pruritis Neg Hx   . Pseudochol deficiency Neg Hx   . Pseudotumor cerebri Neg Hx   . Psoriasis Neg Hx   . Psychosis Neg Hx   . Ptosis Neg Hx   . Pulmonary embolism Neg Hx   . Pulmonary fibrosis Neg Hx   . Pyelonephritis Neg Hx   . Pyloric stenosis Neg Hx   . Pyruvate dehydrogenase deficiency Neg Hx   . Rashes / Skin problems Neg Hx   . Raynaud syndrome Neg Hx   . Rectal cancer Neg Hx   . Recurrent abdominal pain Neg Hx   . Reiter's syndrome Neg Hx   . Renal tubular acidosis Neg Hx   . Restless legs syndrome Neg Hx   . Retinal degeneration Neg Hx   . Retinal detachment Neg Hx   . Retinitis pigmentosa Neg  Hx   . Retinoblastoma Neg Hx   . Retinopathy of prematurity Neg Hx   . Reye's syndrome Neg Hx   . Rheum arthritis Neg Hx   . Rheumatic fever Neg Hx   . Rheumatologic disease Neg Hx   . Rickets Neg Hx   . Rosacea Neg Hx   . Sacroiliitis Neg Hx   . Sarcoidosis Neg Hx   . Schizophrenia Neg Hx   . Scleritis Neg Hx   . Scleroderma Neg Hx   . Scoliosis Neg Hx   . Seasonal affective disorder Neg Hx   .  Seizures Neg Hx   . Selective mutism Neg Hx   . Sensorineural hearing loss Neg Hx   . Severe combined immunodeficiency Neg Hx   . Severe sprains Neg Hx   . Sexual abuse Neg Hx   . Short stature Neg Hx   . Sick sinus syndrome Neg Hx   . Sickle cell anemia Neg Hx   . Sickle cell trait Neg Hx   . SIDS Neg Hx   . Single kidney Neg Hx   . Sinusitis Neg Hx   . Sjogren's syndrome Neg Hx   . Skeletal dysplasia Neg Hx   . Skin cancer Neg Hx   . Skin telangiectasia Neg Hx   . Sleep apnea Neg Hx   . Sleep disorder Neg Hx   . Sleep walking Neg Hx   . Snoring Neg Hx   . Social phobia Neg Hx   . Speech disorder Neg Hx   . Spina bifida Neg Hx   . Spinal muscular atrophy Neg Hx   . Splenomegaly Neg Hx   . Spondyloarthropathy Neg Hx   . Spondylolisthesis Neg Hx   . Spondylolysis Neg Hx   . Squamous cell carcinoma Neg Hx   . Stevens-Johnson syndrome Neg Hx   . Stickler syndrome Neg Hx   . Stomach cancer Neg Hx   . Strabismus Neg Hx   . Stroke Neg Hx   . Stuttering Neg Hx   . Subarachnoid hemorrhage Neg Hx   . Sudden death Neg Hx   . Suicidality Neg Hx   . Supraventricular tachycardia Neg Hx   . Swallowing difficulties Neg Hx   . Tall stature Neg Hx   . Tay-Sachs disease Neg Hx   . Testicular cancer Neg Hx   . Thalassemia Neg Hx   . Throat cancer Neg Hx   . Thrombocytopenia Neg Hx   . Thrombophilia Neg Hx   . Thrombophlebitis Neg Hx   . Thrombosis Neg Hx   . Thymic aplasia Neg Hx   . Thyroid cancer Neg Hx   . Thyroid disease Neg Hx   . Thyroid nodules Neg Hx   . Tics Neg Hx   . Tongue cancer Neg Hx   . Torticollis Neg Hx   . Tourette syndrome Neg Hx   . Tracheal cancer Neg Hx   . Tracheoesophageal fistual Neg Hx   . Tracheomalacia Neg Hx   . Transient ischemic attack Neg Hx   . Treacher Collins syndrome Neg Hx   . Tremor Neg Hx   . Tuberculosis Neg Hx   . Tuberous sclerosis Neg Hx   . Turner syndrome Neg Hx   . Ulcerative colitis Neg Hx   . Ulcers Neg Hx   .  Undescended testes Neg Hx   . Unexplained death Neg Hx   . Urinary tract infection Neg Hx   . Urolithiasis Neg Hx   .  Urticaria Neg Hx   . Uterine cancer Neg Hx   . Uveitis Neg Hx   . Vaginal cancer Neg Hx   . Valvular heart disease Neg Hx   . Vasculitis Neg Hx   . Velocardiofacial syndrome Neg Hx   . Venous thrombosis Neg Hx   . Verruca vulgaris  Neg Hx   . Vesicoureteral reflux Neg Hx   . Vision loss Neg Hx   . Vitamin D deficiency Neg Hx   . Vitiligo Neg Hx   . Voice disorder Neg Hx   . Von Gierke's disease Neg Hx   . Von Hippel-Lindau syndrome Neg Hx   . Von Willebrand disease Neg Hx   . Werdnig Hoffman Disease Neg Hx   . Williams syndrome Neg Hx   . Wilm's tumor Neg Hx   . Wilson's disease Neg Hx   . Wiskott-Aldrich syndrome Neg Hx   . Evelene Croon Parkinson White syndrome Neg Hx   . Xeroderma pigmentosa Neg Hx   . Heart attack Mother   . Heart attack Father     Social History:  reports that she has never smoked. She does not have any smokeless tobacco history on file. She reports that she does not drink alcohol. Her drug history is not on file.  Allergies:  Allergies  Allergen Reactions  . Morphine And Related     nausea  . Penicillins Rash    Medications: I have reviewed the patient's current medications.  Results for orders placed during the hospital encounter of 01/16/13 (from the past 48 hour(s))  PROTIME-INR     Status: None   Collection Time    01/16/13 11:08 AM      Result Value Range   Prothrombin Time 13.7  11.6 - 15.2 seconds   INR 1.06  0.00 - 1.49    Dg Hip Complete Left  01/15/2013  *RADIOLOGY REPORT*  Clinical Data: Fall  LEFT HIP - COMPLETE 2+ VIEW  Comparison: 01/25/2006  Findings: Chronic healed left intertrochanteric fracture in satisfactory alignment.  Surgical fixation is unchanged from the prior study.  No acute fracture.  Chronic fracture right superior and inferior pubic rami.  IMPRESSION: No acute fracture.   Original Report Authenticated  By: Janeece Riggers, [name]    Ct Head Wo Contrast  01/15/2013  *RADIOLOGY REPORT*  Clinical Data: Increased confusion.  Fall.  CT HEAD WITHOUT CONTRAST  Technique:  Contiguous axial images were obtained from the base of the skull through the vertex without contrast.  Comparison: 06/06/2010  Findings: Generalized atrophy.  Moderate chronic microvascular ischemia in the white matter.  Negative for acute infarct. Negative for intracranial hemorrhage.  No skull fracture.  Chronic sinusitis on the left with prior surgery left maxillary sinus.  IMPRESSION: Atrophy and chronic microvascular ischemia.  No acute abnormality.   Original Report Authenticated By: Janeece Riggers, [name]    Ct Hip Left Wo Contrast  01/16/2013  *RADIOLOGY REPORT*  Clinical Data: Left hip pain.  Fall.  CT OF THE LEFT HIP WITHOUT CONTRAST  Technique:  Multidetector CT imaging was performed according to the standard protocol. Multiplanar CT image reconstructions were also generated.  Comparison: Plain films 01/15/2013.  Findings: There is partial visualization of a nondisplaced left parasymphyseal pubic bone fracture which appears acute.  Also seen is an acute fracture of the anterior wall of the left acetabulum. As seen on plain films, hardware fixes an old healed left intertrochanteric fracture.  Hardware is intact and no complicating feature is identified.  There  is no evidence of avascular necrosis of the left femoral head.  Moderate to advanced left hip degenerative disease is seen with joint space narrowing and osteophytosis present. No hip joint effusion is identified.  Musculature about the left hip appears normal.  Imaged intrapelvic contents demonstrate no focal abnormality.  IMPRESSION:  1.  Acute, nondisplaced left parasymphyseal pubic bone fracture. There is also an acute, nondisplaced fracture the anterior wall of the left acetabulum. 2.  Old healed left intertrochanteric fracture with fixation hardware in place. 3.  Marked left hip  degenerative disease.   Original Report Authenticated By: Holley Dexter, [name]     @ROS @ Blood pressure 107/43, pulse 64, temperature 97.3 F (36.3 C), temperature source Oral, resp. rate 16, SpO2 97.00%.  PHYSICAL EXAM:   Neurologically intact ABD soft Sensation intact distally Intact pulses distally Dorsiflexion/Plantar flexion intact Compartment soft  Both hip move fairly well. Cannot actively flex left hip without terrible pain No pain to pelvic compression  ASSESSMENT:   Left acetabular and pelvic nondisplaced fractures  PLAN:   Ysenia has cracked her pelvis in a couple of locations.  These do not require operative stabilization but are quite painful.  We will admit for pain control and engage PT for gradual mobilization.  She normally lives with her nephew and her daughter is next door.  Not sure that she will get back to that in the short term and may need SNF and will will consult appropriate team.  Plan baseline ASA and mechanical means for DVT prophylaxis.     Jatara Huettner G 01/16/2013, 1:04 PM

## 2013-01-16 NOTE — ED Notes (Signed)
Pt able ambulate with EMS assist but c/o of pain

## 2013-01-17 NOTE — Clinical Social Work Placement (Addendum)
Clinical Social Work Department  CLINICAL SOCIAL WORK PLACEMENT NOTE  01/17/2013  Patient: EMELYNN RANCE Account Number: [id number] Admit date: 01/16/13 Clinical Social Worker: Sabino Niemann LCSWA Date/time: 09/03/2012 11:30 AM  Clinical Social Work is seeking post-discharge placement for this patient at the following level of care: SKILLED NURSING (*CSW will update this form in Epic as items are completed)  4/23/2014Patient/family provided with [facility] Department of Clinical Social Work's list of facilities offering this level of care within the geographic area requested by the patient (or if unable, by the patient's family).  4/23/2014Patient/family informed of their freedom to choose among providers that offer the needed level of care, that participate in Medicare, Medicaid or managed care program needed by the patient, have an available bed and are willing to accept the patient.  01/17/2013 Patient/family informed of [facility]' ownership interest in [facility], as well as of the fact that they are under no obligation to receive care at this facility.  PASARR submitted to EDS on  PASARR number received from EDS on  FL2 transmitted to all facilities in geographic area requested by pt/family on 01/17/2013  FL2 transmitted to all facilities within larger geographic area on  Patient informed that his/her managed care company has contracts with or will negotiate with certain facilities, including the following:  Patient/family informed of bed offers received: 01/18/2013 Patient chooses bed at [facility] Physician recommends and patient chooses bed at  Patient to be transferred to on 01/18/2013 Patient to be transferred to facility by [facility] The following physician request were entered in Epic:  Additional Comments:

## 2013-01-17 NOTE — Progress Notes (Addendum)
Subjective:    Dx  Pubic rami fx Admitted for pain control and mobility with therapy-HH vs SNF ?  Activity level:  May be oob wbat Diet tolerance:  ok Voiding:  ok Patient reports pain as 3 on 0-10 scale.    Objective: Vital signs in last 24 hours: Temp:  [97.3 F (36.3 C)-98.8 F (37.1 C)] 98.8 F (37.1 C) (04/23 0623) Pulse Rate:  [64-81] 76 (04/23 0623) Resp:  [15-18] 16 (04/23 0623) BP: (106-142)/(43-75) 126/75 mmHg (04/23 0623) SpO2:  [95 %-100 %] 97 % (04/23 0623)  Labs:  Recent Labs  01/15/13 1727  HGB 14.6    Recent Labs  01/15/13 1727  WBC 12.7*  RBC 4.27  HCT 42.3  PLT 185    Recent Labs  01/15/13 1727  NA 138  K 3.9  CL 99  CO2 30  BUN 13  CREATININE 0.69  GLUCOSE 102*  CALCIUM 10.6*    Recent Labs  01/16/13 1108  INR 1.06    Physical Exam:  Neurologically intact ABD soft Neurovascular intact Sensation intact distally Intact pulses distally Dorsiflexion/Plantar flexion intact No cellulitis present Compartment soft  Assessment/Plan: Pubic rami fx     Advance diet Up with therapy D/C IV fluids We will see how pt goes to help decide HH vs snf in a day or two.my feeling is that she probably will do best had a skilled nursing facility.  She has been to [facility] before and had a good experience. Pain is better now but she has not been oob yet Cont po pain meds as need  ASA ec 81 and scd's for dvt prevention    Leeann Bady R 01/17/2013, 8:02 AM Dx

## 2013-01-17 NOTE — Evaluation (Signed)
Physical Therapy Evaluation Patient Details Name: Marissa Parsons MRN: [id number] DOB: 11-24-1928 Today's Date: 01/17/2013 Time: 1100-1130 PT Time Calculation (min): 30 min  PT Assessment / Plan / Recommendation Clinical Impression  Pt is a pleasant 79 y.o. female adm to [facility] secondary to fall resulting in L acetabular and pelvic fx. Pt has a history of falls. Pt is agreeable to ST SNF for rehab upon acute D/C to return to PLOF. Pt presents with decreased mobility and independence with transfs and balance deficits; making pt a fall risk. Pt to benefit from skilled PT to maximize functional mobility.      PT Assessment  Patient needs continued PT services    Follow Up Recommendations  SNF;Other (comment) (prefers [facility] [place] [facility] )    Does the patient have the potential to tolerate intense rehabilitation      Barriers to Discharge        Equipment Recommendations  None recommended by PT    Recommendations for Other Services     Frequency Min 3X/week    Precautions / Restrictions Precautions Precautions: Fall Precaution Comments: pt has history of multiple falls  Restrictions Weight Bearing Restrictions: Yes   Pertinent Vitals/Pain No indications of pain; states she has pain but did not rate on scale 0-10       Mobility  Bed Mobility Bed Mobility: Supine to Sit;Sitting - Scoot to Edge of Bed Supine to Sit: 4: Min assist;HOB elevated;With rails Sitting - Scoot to Edge of Bed: 4: Min assist;With rail Details for Bed Mobility Assistance: required constant cues for hand placement and sequencing; would stop during task and require cues to redirect for hand placement and sequencing; required min A to advance L LE to EOB. Transfers Transfers: Sit to Stand;Stand to Sit;Stand Pivot Transfers Sit to Stand: 4: Min assist;From elevated surface;With upper extremity assist;From bed Stand to Sit: 4: Min assist;To chair/3-in-1;With upper extremity assist;With armrests Stand Pivot Transfers:  3: Mod assist;With armrests Details for Transfer Assistance: min requried constant cues for sequencing and hand placement; demo posterior lean and decreased ability to fully WB through feet; would WB through bil heels; multimodal cues for upright postures and sequencing for SPT; demo fear of falling with transfers; pt began to urinate during each transfer and was unaware unable to verbalize need for going to restroom. demo decreased safety awareness with transfers Ambulation/Gait Ambulation/Gait Assistance: Not tested (comment) Stairs: No Wheelchair Mobility Wheelchair Mobility: No    Exercises     PT Diagnosis: Difficulty walking;Acute pain  PT Problem List: Decreased strength;Decreased range of motion;Decreased activity tolerance;Decreased balance;Decreased mobility;Decreased cognition;Decreased safety awareness;Decreased knowledge of use of DME;Pain PT Treatment Interventions: DME instruction;Gait training;Functional mobility training;Therapeutic activities;Therapeutic exercise;Balance training;Neuromuscular re-education;Cognitive remediation;Patient/family education   PT Goals Acute Rehab PT Goals PT Goal Formulation: With patient Time For Goal Achievement: 01/22/13 Potential to Achieve Goals: Good Pt will go Supine/Side to Sit: with modified independence PT Goal: Supine/Side to Sit - Progress: Goal set today Pt will Sit at Edge of Bed: with modified independence PT Goal: Sit at Edge Of Bed - Progress: Goal set today Pt will go Sit to Stand: with supervision PT Goal: Sit to Stand - Progress: Goal set today Pt will go Stand to Sit: with supervision PT Goal: Stand to Sit - Progress: Goal set today Pt will Transfer Bed to Chair/Chair to Bed: with supervision PT Transfer Goal: Bed to Chair/Chair to Bed - Progress: Goal set today Pt will Ambulate: with supervision;with rolling walker;51 - 150 feet PT Goal: Ambulate -  Progress: Goal set today  Visit Information  Last PT Received On:  01/17/13 Assistance Needed: +2 (to follow with chair for safety while amb) PT/OT Co-Evaluation/Treatment: Yes    Subjective Data  Subjective: Lying supine in no acute distress; agreeable to thearpy. " are you going to get me up? I fell on my back porch because my legs got tired" Patient Stated Goal: to get better   Prior Functioning  Home Living Lives With: Other (Comment);Family (Nephew lives  with her ) Available Help at Discharge: Family;Available PRN/intermittently;Other (Comment) (Neighbor will check on her during the day) Type of Home: House Home Access: Stairs to enter [employer] of Steps: 2 Entrance Stairs-Rails: Can reach both Home Layout: One level Bathroom Shower/Tub: Health visitor: Handicapped height Bathroom Accessibility: Yes How Accessible: Accessible via walker Home Adaptive Equipment: Shower chair without back;Straight cane;Walker - rolling Additional Comments: has been walking with RW for ~3 years Prior Function Level of Independence: Independent with assistive device(s) Able to Take Stairs?: Yes Driving: No Vocation: Retired Musician: No difficulties Dominant Hand: Right    Cognition  Cognition Arousal/Alertness: Awake/alert Behavior During Therapy: WFL for tasks assessed/performed Overall Cognitive Status: No family/caregiver present to determine baseline cognitive functioning Area of Impairment: Orientation Orientation Level: Time;Disoriented to (unsure of the year ) Current Attention Level: Selective General Comments: could focus on one step command; required constant cues for sequencing and staying on task; easily distractable     Extremity/Trunk Assessment Right Lower Extremity Assessment RLE ROM/Strength/Tone: WFL for tasks assessed RLE Sensation: WFL - Light Touch Left Lower Extremity Assessment LLE ROM/Strength/Tone: WFL for tasks assessed LLE Sensation: WFL - Light Touch Trunk Assessment Trunk  Assessment: Kyphotic   Balance Balance Balance Assessed: Yes Static Sitting Balance Static Sitting - Balance Support: Bilateral upper extremity supported;Feet supported Static Sitting - Level of Assistance: 4: Min assist;5: Stand by assistance Static Sitting - Comment/# of Minutes: initally min (A) required; pt able to sit at EOB with stand by assist with cues and encouragement fro ~ 3 min; no c/o dizziness   End of Session PT - End of Session Equipment Utilized During Treatment: Gait belt Activity Tolerance: Patient tolerated treatment well Patient left: in chair;with call bell/phone within reach;with chair alarm set Nurse Communication: Mobility status  GP     Donell Sievert, [place] [phone] 01/17/2013, 12:01 PM

## 2013-01-17 NOTE — Evaluation (Signed)
Occupational Therapy Evaluation Patient Details Name: Marissa Parsons MRN: [id number] DOB: Apr 16, 1929 Today's Date: 01/17/2013 Time: 5170-0174 OT Time Calculation (min): 29 min  OT Assessment / Plan / Recommendation Clinical Impression    Pt is a pleasant 79 y.o. female adm to [facility] secondary to fall resulting in L acetabular and pelvic fx. Pt has a history of falls. Pt is agreeable to ST SNF for rehab upon acute D/C to return to PLOF. Pt presents with below problem list and currently at Mod A level for LB ADLs.  OT not setting goals due to plan to d/c SNF, however if d/c plan changes, will set goals.      OT Assessment  Patient needs continued OT Services    Follow Up Recommendations  SNF    Barriers to Discharge      Equipment Recommendations  Other (comment) (defer to snf)    Recommendations for Other Services    Frequency       Precautions / Restrictions Precautions Precautions: Fall Precaution Comments: pt has history of multiple falls  Restrictions Weight Bearing Restrictions: Yes   Pertinent Vitals/Pain No indications of pain; states she has pain but did not rate on scale 0-10      ADL  Eating/Feeding: Independent Where Assessed - Eating/Feeding: Chair Grooming: Set up Where Assessed - Grooming: Supported sitting Upper Body Bathing: Set up Where Assessed - Upper Body Bathing: Supported sitting Lower Body Bathing: Moderate assistance Where Assessed - Lower Body Bathing: Supported sit to stand Upper Body Dressing: Set up Where Assessed - Upper Body Dressing: Supported sitting Lower Body Dressing: Moderate assistance Where Assessed - Lower Body Dressing: Supported sit to Pharmacist, hospital: Moderate assistance Toilet Transfer Method: Stand pivot Toilet Transfer Equipment: Raised toilet seat with arms (or 3-in-1 over toilet) Toileting - Clothing Manipulation and Hygiene: Minimal assistance (min a-clothing  hygiene sitting-supervision) Where Assessed - Toileting  Clothing Manipulation and Hygiene: Sit on 3-in-1 or toilet (hygiene-sitting  clothing-standing) Tub/Shower Transfer Method: Not assessed Equipment Used: Gait belt;Rolling walker ADL Comments: Pt able to don/doff socks while sitting in chair. Pt at Mod A for LB dressing with sit to stand transfer.     OT Diagnosis: Acute pain  OT Problem List: Decreased strength;Decreased range of motion;Decreased activity tolerance;Impaired balance (sitting and/or standing);Decreased cognition;Decreased safety awareness;Decreased knowledge of use of DME or AE;Pain OT Treatment Interventions: Self-care/ADL training;DME and/or AE instruction;Therapeutic activities;Balance training;Patient/family education   OT Goals    Visit Information  Last OT Received On: 01/17/13 Assistance Needed: +2 PT/OT Co-Evaluation/Treatment: Yes    Subjective Data      Prior Functioning     Home Living Lives With: Other (Comment);Family (Nephew lives  with her ) Available Help at Discharge: Family;Available PRN/intermittently;Other (Comment) (Neighbor will check on her during the day) Type of Home: House Home Access: Stairs to enter [employer] of Steps: 2 Entrance Stairs-Rails: Can reach both Home Layout: One level Bathroom Shower/Tub: Health visitor: Handicapped height Bathroom Accessibility: Yes How Accessible: Accessible via walker Home Adaptive Equipment: Shower chair without back;Straight cane;Walker - rolling Additional Comments: has been walking with RW for ~3 years Prior Function Level of Independence: Independent with assistive device(s) Able to Take Stairs?: Yes Driving: No Vocation: Retired Musician: No difficulties Dominant Hand: Right         Vision/Perception     Cognition  Cognition Arousal/Alertness: Awake/alert Behavior During Therapy: WFL for tasks assessed/performed Overall Cognitive Status: No family/caregiver present to determine  baseline cognitive functioning Area  of Impairment: Orientation Orientation Level: Time;Disoriented to (unsure of the year ) Current Attention Level: Selective General Comments: could focus on one step command; required constant cues for sequencing and staying on task; easily distractable     Extremity/Trunk Assessment Right Upper Extremity Assessment RUE ROM/Strength/Tone: Deficits RUE ROM/Strength/Tone Deficits: shoulder flexion approx 150 degrees due to reported past injury Left Upper Extremity Assessment LUE ROM/Strength/Tone: [facility] for tasks assessed   Mobility Bed Mobility Bed Mobility: Supine to Sit;Sitting - Scoot to Edge of Bed Supine to Sit: 4: Min assist;HOB elevated;With rails Sitting - Scoot to Edge of Bed: 4: Min assist;With rail Details for Bed Mobility Assistance: required constant cues for hand placement and sequencing; would stop during task and require cues to redirect for hand placement and sequencing; required min A to advance L LE to EOB. Transfers Sit to Stand: 4: Min assist;From elevated surface;With upper extremity assist;From bed Stand to Sit: 4: Min assist;To chair/3-in-1;With upper extremity assist;With armrests Details for Transfer Assistance: min requried constant cues for sequencing and hand placement; demo posterior lean and decreased ability to fully WB through feet; would WB through bil heels; multimodal cues for upright postures and sequencing for SPT; demo fear of falling with transfers; pt began to urinate during each transfer and was unaware unable to verbalize need for going to restroom. demo decreased safety awareness with transfers     Exercise   Balance   End of Session OT - End of Session Equipment Utilized During Treatment: Gait belt Activity Tolerance: Patient tolerated treatment well Patient left: in chair;with call bell/phone within reach Nurse Communication: Mobility status  GO     Earlie Raveling OTR/L [phone] 01/17/2013, 3:04 PM

## 2013-01-18 MED ORDER — HYDROCODONE-ACETAMINOPHEN 5-325 MG PO TABS: 1.0000 | ORAL_TABLET | Freq: Four times a day (QID) | ORAL | Status: DC | PRN

## 2013-01-18 MED ORDER — TRAMADOL HCL 50 MG PO TABS: 50.0000 mg | ORAL_TABLET | Freq: Two times a day (BID) | ORAL | Status: DC

## 2013-01-18 MED ORDER — DIAZEPAM 5 MG PO TABS: 5.0000 mg | ORAL_TABLET | Freq: Every day | ORAL | Status: DC

## 2013-01-18 NOTE — Progress Notes (Signed)
Physical Therapy Treatment Patient Details Name: Marissa Parsons MRN: [id number] DOB: 12/14/1928 Today's Date: 01/18/2013 Time: 4034-7425 PT Time Calculation (min): 24 min  PT Assessment / Plan / Recommendation Comments on Treatment Session  Pt was a little distracted this am due to pending return to SNF today. Pt demonstrated poor safety awareness and balance when fatigued. Pt requires a substantial amount of tactile cues to manage RW and verbal cues to maintain balance/posture. Recommend return to SNF with PT.    Follow Up Recommendations  SNF     Does the patient have the potential to tolerate intense rehabilitation     Barriers to Discharge        Equipment Recommendations  None recommended by PT    Recommendations for Other Services    Frequency     Plan Discharge plan remains appropriate    Precautions / Restrictions Precautions Precautions: Fall Restrictions Weight Bearing Restrictions: No   Pertinent Vitals/Pain     Mobility  Bed Mobility Bed Mobility: Supine to Sit;Sitting - Scoot to Edge of Bed Supine to Sit: 4: Min assist Sitting - Scoot to [place] of Bed: 4: Min assist Details for Bed Mobility Assistance: cues for hand placement for increased safety and independence. Transfers Transfers: Sit to Stand;Stand to Sit Sit to Stand: 4: Min assist Stand to Sit: 4: Min assist Stand Pivot Transfers: 4: Min assist Details for Transfer Assistance: cues for hand placement and upright posture, forward flexed trunk and cues for sequencing. Ambulation/Gait Ambulation/Gait Assistance: 1: +2 Total assist (+2 for safety to bring chair, pt unable to tell when fatigue) Ambulation/Gait: Patient Percentage: 60% Ambulation Distance (Feet): 65 Feet Assistive device: Rolling walker Ambulation/Gait Assistance Details: Max cues for upright posture and walker management. Pt, when fatigued, pushed walker way too far ahead and attepmted to lean forward on RW and required a chair brought  to her for her safety.  Gait Pattern: Step-to pattern;Trunk flexed;Decreased hip/knee flexion - right;Decreased hip/knee flexion - left Gait velocity: decreased Stairs: No    Exercises     PT Diagnosis:    PT Problem List:   PT Treatment Interventions:     PT Goals Acute Rehab PT Goals PT Goal: Supine/Side to Sit - Progress: Progressing toward goal PT Goal: Sit at Edge Of Bed - Progress: Progressing toward goal PT Goal: Sit to Stand - Progress: Progressing toward goal PT Goal: Stand to Sit - Progress: Progressing toward goal PT Transfer Goal: Bed to Chair/Chair to Bed - Progress: Progressing toward goal PT Goal: Ambulate - Progress: Progressing toward goal  Visit Information  Last PT Received On: 01/18/13 Assistance Needed: +2    Subjective Data  Subjective: I am leaving to the nursing home today.   Cognition  Cognition Arousal/Alertness: Awake/alert Behavior During Therapy: WFL for tasks assessed/performed Overall Cognitive Status: No family/caregiver present to determine baseline cognitive functioning Area of Impairment: Safety/judgement;Problem solving;Memory Memory: Decreased recall of precautions Safety/Judgement: Decreased awareness of safety Problem Solving: Slow processing    Balance  Balance Balance Assessed: Yes Static Sitting Balance Static Sitting - Balance Support: No upper extremity supported Static Sitting - Level of Assistance: 6: Modified independent (Device/Increase time) Static Standing Balance Static Standing - Balance Support: Bilateral upper extremity supported Static Standing - Level of Assistance: 5: Stand by assistance  End of Session PT - End of Session Equipment Utilized During Treatment: Gait belt Activity Tolerance: Patient limited by fatigue Patient left: in chair;with call bell/phone within reach Nurse Communication: Mobility status   GP  Greggory Stallion 01/18/2013, 11:29 AM

## 2013-01-18 NOTE — Progress Notes (Signed)
Clinical Social Work Department  BRIEF PSYCHOSOCIAL ASSESSMENT  Patient: Marissa Parsons  Account Number: [id number]   Admit date: 01/16/13 Clinical Social Worker Sabino Niemann, MSW Date/Time: 01/17/13 3PM Referred by: Physician Date Referred:  Referred for   SNF Placement   Other Referral:  Interview type: Patient and patient's friend Other interview type: PSYCHOSOCIAL DATA  Living Status:Alone Admitted from facility:  Level of care:  Primary support name: Hancock,Mahlon B  Primary support relationship to patient: Brother Degree of support available:  Strong and vested  CURRENT CONCERNS  Current Concerns   Post-Acute Placement   Other Concerns:  SOCIAL WORK ASSESSMENT / PLAN  CSW met with pt re: PT recommendation for SNF.   Pt lives alone  CSW explained placement process and answered questions.   Pt reports [facility] as her preference    CSW completed FL2 and initiated SNF search.     Assessment/plan status: Information/Referral to [facility]  Other assessment/ plan:  Information/referral to community resources:  SNF   PTAR  PATIENT'S/FAMILY'S RESPONSE TO PLAN OF CARE:  Pt  reports she is agreeable to ST SNF in order to increase strength and independence with mobility prior to returning home  Pt verbalized understanding of placement process and appreciation for CSW assist.   Sabino Niemann, MSW [phone]

## 2013-01-18 NOTE — Care Management Note (Signed)
CARE MANAGEMENT NOTE 01/18/2013  Patient:  Marissa Parsons, Marissa Parsons   Account Number:  [id number]  Date Initiated:  01/18/2013  Documentation initiated by:  Vance Peper  Subjective/Objective Assessment:   79 yr old female admitted for pelvic rami fractue s/p fall.     Action/Plan:   Patient will be going to shortterm rehab . social worker is aware.   Anticipated DC Date:  01/18/2013   Anticipated DC Plan:  SKILLED NURSING FACILITY  In-house referral  Clinical Social Worker      DC Planning Services  CM consult      Choice offered to / List presented to:             Status of service:  Completed, signed off Medicare Important Message given?   (If response is "NO", the following Medicare IM given date fields will be blank) Date Medicare IM given:   Date Additional Medicare IM given:    Discharge Disposition:  SKILLED NURSING FACILITY  Per UR Regulation:    If discussed at Long Length of Stay Meetings, dates discussed:    Comments:

## 2013-01-18 NOTE — Progress Notes (Signed)
Subjective:     left pubic rami fractures  Patient has worked with therapy and progressed. There is a skilled nursing facility bed available for today.  Activity level:  May be weightbearing as tolerated with a walker Diet tolerance:  ok Voiding:  ok Patient reports pain as 1 on 0-10 scale.    Objective: Vital signs in last 24 hours: Temp:  [97.7 F (36.5 C)-98.2 F (36.8 C)] 98.2 F (36.8 C) (04/24 0643) Pulse Rate:  [63-86] 86 (04/24 0643) Resp:  [16-18] 17 (04/24 0643) BP: (114-130)/(46-58) 130/58 mmHg (04/24 0643) SpO2:  [93 %-100 %] 93 % (04/24 0643)  Labs:  Recent Labs  01/15/13 1727  HGB 14.6    Recent Labs  01/15/13 1727  WBC 12.7*  RBC 4.27  HCT 42.3  PLT 185    Recent Labs  01/15/13 1727  NA 138  K 3.9  CL 99  CO2 30  BUN 13  CREATININE 0.69  GLUCOSE 102*  CALCIUM 10.6*    Recent Labs  01/16/13 1108  INR 1.06    Physical Exam:  Neurologically intact ABD soft Neurovascular intact Sensation intact distally Intact pulses distally Dorsiflexion/Plantar flexion intact No cellulitis present Compartment soft  Assessment/Plan: Left pubic rami fractures stable     Advance diet Up with therapy Discharge to SNF todayy-[facility] She may be weightbearing as tolerated. Return to our office in 3 weeks Continue ASA enteric coated 81 mg 1 pill per day and SCDs Prescriptions in chart for discharge    Marissa Parsons R 01/18/2013, 10:58 AM

## 2013-01-18 NOTE — Progress Notes (Signed)
Patient discharged to SNF in stable condition via ambulance.

## 2013-01-18 NOTE — Progress Notes (Signed)
Patient screened for long-term disease management services with [facility] Care Management Program as a benefit of her [employer]. Noted discharge plan is SNF. Therefore, [facility] Care Management not appropriate at this time.  Raiford Noble, MSN-Ed, RN,BSN, [facility], [phone]

## 2013-01-18 NOTE — Discharge Summary (Signed)
Patient ID: Marissa Parsons MRN: [id number] DOB/AGE: June 10, 1929 79 y.o.  Admit date: 01/16/2013 Discharge date: 01/18/2013  Admission Diagnoses:  Principal Problem:   Fracture of multiple pubic rami Active Problems:   Hypothyroidism   Osteoporosis   Osteoarthritis   Discharge Diagnoses:  Same  Past Medical History  Diagnosis Date  . Hypertension   . Rheumatoid arteritis   . Phlebitis   . Deep vein thrombophlebitis of leg   . Fractured pelvis     fall 2011  . Thyroid disease     hypothyroidism  . Humerus fracture   . Thrombocytopenia   . Leukocytosis   . Hypothyroidism   . Phlebitis      Recurrent phlebitis  . Hematoma      Right superior and inferior pubic rami fractures with hematoma adjacent to the right ramus fracture  . Urinary tract infection   . Humerus fracture      Right proximal humerus fracture  . Osteoarthritis     for which she takes chronic prednisone  . Macular degeneration   . Chronic anticoagulation   . Fracture of multiple pubic rami 01/16/2013  . Family history of anesthesia complication     SISTER HAS NAUSEA   . Dementia     MILD    Surgeries:  on * No surgery found *   Consultants:    Discharged Condition: Improved  Hospital Course: AEDAN BURFORD is an 79 y.o. female who was admitted 01/16/2013 for non operative treatment ofFracture of multiple pubic rami. Patient has severe unremitting pain that affects sleep, daily activities, and work/hobbies. She had fallen recently and presented to the emergency room the day of admission. She was having trouble with pain control and her ability to ambulate with significantly decreased. We had admitted her to the hospital for pain control and physical therapy to help with mobility. We have also discussed with her and family members skilled nursing facility placement short-term post hospital stay. .    Patient was given perioperative antibiotics: Anti-infectives   None       Patient was given  sequential compression devices, early ambulation, and chemoprophylaxis to prevent DVT.  Patient benefited maximally from hospital stay and there were no complications.    Recent vital signs: Patient Vitals for the past 24 hrs:  BP Temp Temp src Pulse Resp SpO2  01/18/13 0643 130/58 mmHg 98.2 F (36.8 C) - 86 17 93 %  01/17/13 2110 127/46 mmHg 97.7 F (36.5 C) Oral 65 16 95 %  01/17/13 1416 114/49 mmHg 97.9 F (36.6 C) Oral 63 18 100 %     Recent laboratory studies:  Recent Labs  01/15/13 1727 01/16/13 1108  WBC 12.7*  --   HGB 14.6  --   HCT 42.3  --   PLT 185  --   NA 138  --   K 3.9  --   CL 99  --   CO2 30  --   BUN 13  --   CREATININE 0.69  --   GLUCOSE 102*  --   INR  --  1.06  CALCIUM 10.6*  --      Discharge Medications:     Medication List    STOP taking these medications       warfarin 5 MG tablet  Commonly known as:  COUMADIN      TAKE these medications       aspirin EC 81 MG tablet  Take 81 mg by mouth daily.  bisoprolol-hydrochlorothiazide 5-6.25 MG per tablet  Commonly known as:  ZIAC  Take 1 tablet by mouth daily.     diazepam 5 MG tablet  Commonly known as:  VALIUM  Take 1 tablet (5 mg total) by mouth at bedtime.     diltiazem 240 MG 24 hr capsule  Commonly known as:  CARDIZEM CD  Take 240 mg by mouth daily.     HYDROcodone-acetaminophen 5-325 MG per tablet  Commonly known as:  NORCO/VICODIN  Take 1 tablet by mouth every 6 (six) hours as needed.     levothyroxine 50 MCG tablet  Commonly known as:  SYNTHROID, LEVOTHROID  Take 50-100 mcg by mouth See admin instructions. Takes 1 tablet daily except on Monday and Friday when she takes 2 tablets     predniSONE 5 MG tablet  Commonly known as:  DELTASONE  Take 5 mg by mouth daily.     traMADol 50 MG tablet  Commonly known as:  ULTRAM  Take 1 tablet (50 mg total) by mouth 2 (two) times daily.       she can work with therapy he can be weightbearing as tolerated on both lower  extremities. Prescription for tramadol was given for less severe pain and hydrocodone for more severe pain. My hope is that she will need very little pain medication at all. She will continue with her ASA dose and early mobility to prevent a DVT.  Diagnostic Studies: Dg Hip Complete Left  01/15/2013  *RADIOLOGY REPORT*  Clinical Data: Fall  LEFT HIP - COMPLETE 2+ VIEW  Comparison: 01/25/2006  Findings: Chronic healed left intertrochanteric fracture in satisfactory alignment.  Surgical fixation is unchanged from the prior study.  No acute fracture.  Chronic fracture right superior and inferior pubic rami.  IMPRESSION: No acute fracture.   Original Report Authenticated By: Janeece Riggers, [name]    Ct Head Wo Contrast  01/15/2013  *RADIOLOGY REPORT*  Clinical Data: Increased confusion.  Fall.  CT HEAD WITHOUT CONTRAST  Technique:  Contiguous axial images were obtained from the base of the skull through the vertex without contrast.  Comparison: 06/06/2010  Findings: Generalized atrophy.  Moderate chronic microvascular ischemia in the white matter.  Negative for acute infarct. Negative for intracranial hemorrhage.  No skull fracture.  Chronic sinusitis on the left with prior surgery left maxillary sinus.  IMPRESSION: Atrophy and chronic microvascular ischemia.  No acute abnormality.   Original Report Authenticated By: Janeece Riggers, [name]    Ct Hip Left Wo Contrast  01/16/2013  *RADIOLOGY REPORT*  Clinical Data: Left hip pain.  Fall.  CT OF THE LEFT HIP WITHOUT CONTRAST  Technique:  Multidetector CT imaging was performed according to the standard protocol. Multiplanar CT image reconstructions were also generated.  Comparison: Plain films 01/15/2013.  Findings: There is partial visualization of a nondisplaced left parasymphyseal pubic bone fracture which appears acute.  Also seen is an acute fracture of the anterior wall of the left acetabulum. As seen on plain films, hardware fixes an old healed left intertrochanteric  fracture.  Hardware is intact and no complicating feature is identified.  There is no evidence of avascular necrosis of the left femoral head.  Moderate to advanced left hip degenerative disease is seen with joint space narrowing and osteophytosis present. No hip joint effusion is identified.  Musculature about the left hip appears normal.  Imaged intrapelvic contents demonstrate no focal abnormality.  IMPRESSION:  1.  Acute, nondisplaced left parasymphyseal pubic bone fracture. There is also an acute, nondisplaced  fracture the anterior wall of the left acetabulum. 2.  Old healed left intertrochanteric fracture with fixation hardware in place. 3.  Marked left hip degenerative disease.   Original Report Authenticated By: Holley Dexter, [name]     Disposition: 01-Home or Self Care      Discharge Orders   Future Appointments Provider Department Dept Phone   01/31/2013 11:15 AM [facility] [phone]   03/06/2013 2:00 PM [name], MD [facility] Main Office [place]) [phone]   Future Orders Complete By Expires     Call MD / Call 911  As directed     Comments:      If you experience chest pain or shortness of breath, CALL 911 and be transported to the hospital emergency room.  If you develope a fever above 101 F, pus (white drainage) or increased drainage or redness at the wound, or calf pain, call your surgeon's office.    Constipation Prevention  As directed     Comments:      Drink plenty of fluids.  Prune juice may be helpful.  You may use a stool softener, such as Colace (over the counter) 100 mg twice a day.  Use MiraLax (over the counter) for constipation as needed.    Diet - low sodium heart healthy  As directed     Increase activity slowly as tolerated  As directed        Follow-up Information   Follow up with DALLDORF,PETER G, MD. Call in 3 weeks.   Contact information:   [address] [place]  [address] [phone]        Signed: Prince Rome 01/18/2013, 11:06 AM

## 2013-01-18 NOTE — Progress Notes (Signed)
Clinical social worker assisted with patient discharge to skilled nursing facility, [facility].  CSW addressed all family questions and concerns. CSW copied chart and added all important documents. CSW also set up patient transportation with [facility]. Clinical Social Worker will sign off for now as social work intervention is no longer needed.   Javona Bergevin, MSW, [phone] 

## 2013-01-18 NOTE — ED Provider Notes (Signed)
Medical screening examination/treatment/procedure(s) were conducted as a shared visit with non-physician practitioner(s) and myself.  I personally evaluated the patient during the encounter.  CT scan of left hip reveals pubic bone fracture and fracture of the acetabulum.  Admit to orthopedics  Donnetta Hutching, MD 01/18/13 [phone]

## 2013-01-19 NOTE — ED Provider Notes (Signed)
Medical screening examination/treatment/procedure(s) were conducted as a shared visit with non-physician practitioner(s) and myself.  I personally evaluated the patient during the encounter.  Presents after a mechanical fall. Patient had evidence of hitting her head, but no neurologic abnormality. CT head unremarkable.  Gilda Crease, MD 01/19/13 [phone]

## 2013-02-07 ENCOUNTER — Other Ambulatory Visit: Payer: Self-pay | Admitting: *Deleted

## 2013-02-07 MED ORDER — PREDNISONE 5 MG PO TABS: 5.0000 mg | ORAL_TABLET | Freq: Every day | ORAL | Status: DC

## 2013-03-05 ENCOUNTER — Telehealth: Payer: Self-pay | Admitting: Cardiology

## 2013-03-05 NOTE — Telephone Encounter (Signed)
New Problem  Marissa Parsons form Marissa Parsons is requesting a verbal order so they can have the social worker to go out and see this patient. She said social service has a program for the visually impaired that patient may use.

## 2013-03-05 NOTE — Telephone Encounter (Signed)
Left message ok to have social worker go out an see patient

## 2013-03-05 NOTE — Telephone Encounter (Signed)
agree

## 2013-03-06 ENCOUNTER — Ambulatory Visit: Payer: Medicare Other | Admitting: Cardiology

## 2013-03-26 ENCOUNTER — Telehealth: Payer: Self-pay | Admitting: Cardiology

## 2013-03-26 NOTE — Telephone Encounter (Signed)
New problem  Pt wants to speak with you regarding appt.

## 2013-03-26 NOTE — Telephone Encounter (Signed)
Patient has a place on foot she is seeing Dr Terri Piedra this week.

## 2013-04-10 ENCOUNTER — Telehealth: Payer: Self-pay | Admitting: Cardiology

## 2013-04-10 NOTE — Telephone Encounter (Signed)
New problem   Pt states she needs to talk to you-but did not disclose any other information

## 2013-04-10 NOTE — Telephone Encounter (Signed)
Scheduled follow up ov with patient

## 2013-04-30 ENCOUNTER — Ambulatory Visit (INDEPENDENT_AMBULATORY_CARE_PROVIDER_SITE_OTHER): Payer: Medicare Other | Admitting: Cardiology

## 2013-04-30 ENCOUNTER — Encounter: Payer: Self-pay | Admitting: Cardiology

## 2013-04-30 VITALS — BP 122/70 | HR 62 | Ht 67.0 in | Wt 138.0 lb

## 2013-04-30 DIAGNOSIS — Z7901 Long term (current) use of anticoagulants: Secondary | ICD-10-CM

## 2013-04-30 DIAGNOSIS — M810 Age-related osteoporosis without current pathological fracture: Secondary | ICD-10-CM

## 2013-04-30 DIAGNOSIS — I119 Hypertensive heart disease without heart failure: Secondary | ICD-10-CM

## 2013-04-30 NOTE — Patient Instructions (Addendum)
Your physician has recommended you make the following change in your medication:  DECREASE PREDNISONE TO EVERY OTHER DAY FOR 1 WEEK THEN STOP.    Your physician wants you to follow-up in: 6 MONTHS WITH EKG  You will receive a reminder letter in the mail two months in advance. If you don't receive a letter, please call our office to schedule the follow-up appointment.

## 2013-04-30 NOTE — Assessment & Plan Note (Signed)
The patient has had a long history of being on low-dose prednisone 5 mg daily.  We're going to taper her prednisone to every other day for a week and then stop it altogether.  With her history of osteoporosis we do not want her on long-term prednisone.

## 2013-04-30 NOTE — Assessment & Plan Note (Signed)
She has not had any recurrent DVTs since her Coumadin was stopped.  She now takes a baby aspirin daily

## 2013-04-30 NOTE — Assessment & Plan Note (Signed)
Her blood pressure was remaining stable on current therapy 

## 2013-04-30 NOTE — Progress Notes (Signed)
Marissa Parsons Date of Birth:  [birth year] [facility] [address] [place], [place]  [address] [phone]  Fax   [phone]  HPI: This pleasant 79 year old woman is seen for a scheduled followup office visit. She has a past history of hypertensive cardiovascular disease and a remote history of deep vein recurrent thrombosis. He has hypothyroidism on medication. She also has osteoarthritis.  She was hospitalized overnight from 07/26/2012 and a discharged on 07/27/2012. She was hospitalized for chest pain felt to be secondary to possible acute pericarditis. Her symptoms responded to colchicine. She did not have any diagnostic EKG changes or audible pericardial rub. She underwent an echocardiogram on 07/26/12 which showed normal systolic function with an ejection fraction of 60-65% and there was grade 1 diastolic dysfunction. She has mild aortic stenosis and mild left atrial enlargement interpolar artery pressure was increased at 49 m mercury. Her EKG on admission was read by the computer as showing atrial fibrillation but actually shows normal sinus rhythm with premature atrial beats.  The patient was rehospitalized in April 2014 after falling and breaking her pelvis.  She went to [facility] for rehabilitation and return to her own home May 19.  Because of her history of repeated falls, she is no longer on Coumadin.  She states that her orthopedist Dr. Yisroel Ramming has told her that her falls her because of her back problems causing her legs to give way.  She walks with a walker. The patient is disabled because of macular degeneration. 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 She is unable to see well enough to cook.    Current Outpatient Prescriptions  Medication Sig Dispense Refill  . aspirin EC 81 MG tablet Take 81 mg by mouth daily.      . diazepam (VALIUM) 5 MG tablet Take 1 tablet (5 mg total) by mouth at bedtime.  30 tablet   0  . diltiazem (CARDIZEM CD) 240 MG 24 hr capsule Take 240 mg by mouth daily.      Marland Kitchen levothyroxine (SYNTHROID, LEVOTHROID) 50 MCG tablet Take 50-100 mcg by mouth See admin instructions. Takes 1 tablet daily except on Monday and Friday when she takes 2 tablets       No current facility-administered medications for this visit.    Allergies  Allergen Reactions  . Morphine And Related     nausea  . Tramadol   . Penicillins Rash    Patient Active Problem List   Diagnosis Date Noted  . Hypothyroidism 01/05/2011    Priority: Medium  . Macular degeneration 01/05/2011    Priority: Medium  . Benign hypertensive heart disease without heart failure 01/05/2011    Priority: Medium  . Fracture of multiple pubic rami 01/16/2013    Class: Acute  . PAC (premature atrial contraction) 08/07/2012  . Hypokalemia 07/27/2012  . Neutrophilic leukocytosis 07/27/2012  . Chronic anticoagulation   . Hypertension   . Pericarditis, acute 07/26/2012  . Weight loss, unintentional 04/04/2012  . Osteoporosis 01/05/2011  . Deep vein thrombosis (DVT) 01/05/2011  . Osteoarthritis 01/05/2011    History  Smoking status  . Former Smoker  Smokeless tobacco  . Never Used    History  Alcohol Use No    Family History  Problem Relation Age of Onset  . Abnormal EKG Neg Hx   . Abnormal newborn screen Neg Hx   . Achalasia Neg Hx   . Achondroplasia Neg Hx   . Acne Neg  Hx   . Acromegaly Neg Hx   . Actinic keratosis Neg Hx   . Acute lymphoblastic leukemia Neg Hx   . Addison's disease Neg Hx   . Adrenal disorder Neg Hx   . Albinism Neg Hx   . Alcohol abuse Neg Hx   . Alkaptonuria Neg Hx   . Allergic rhinitis Neg Hx   . Allergies Neg Hx   . Allergy (severe) Neg Hx   . Alopecia Neg Hx   . Alpha-1 antitrypsin deficiency Neg Hx   . Alport syndrome Neg Hx   . ALS Neg Hx   . Alzheimer's disease Neg Hx   . Ambiguous genitalia Neg Hx   . Amblyopia Neg Hx   . Amenorrhea Neg Hx   . Anal fissures Neg Hx     . Anemia Neg Hx   . Anesthesia problems Neg Hx   . Aneurysm Neg Hx   . Angelman syndrome Neg Hx   . Angina Neg Hx   . Angioedema Neg Hx   . Ankylosing spondylitis Neg Hx   . Anorectal malformation Neg Hx   . Anorexia nervosa Neg Hx   . Anti-cardiolipin syndrome Neg Hx   . Antithrombin III deficiency Neg Hx   . Anuerysm Neg Hx   . Anxiety disorder Neg Hx   . Aortic aneurysm Neg Hx   . Aortic dissection Neg Hx   . Aortic stenosis Neg Hx   . Aphthous stomatitis Neg Hx   . Aplastic anemia Neg Hx   . Appendicitis Neg Hx   . Apraxia Neg Hx   . Arnold-Chiari malformation Neg Hx   . Arrhythmia Neg Hx   . Arthritis Neg Hx   . Asperger's syndrome Neg Hx   . Asthma Neg Hx   . Ataxia Neg Hx   . Ataxia telangiectasia Neg Hx   . Atopy Neg Hx   . Atrial fibrillation Neg Hx   . Atrophic kidney Neg Hx   . Auditory processing disorder Neg Hx   . Autism Neg Hx   . Autism spectrum disorder Neg Hx   . Autoimmune disease Neg Hx   . AVM Neg Hx   . Baker's cyst Neg Hx   . Barrett's esophagus Neg Hx   . Bartter's syndrome Neg Hx   . Basal cell carcinoma Neg Hx   . Behavior problems Neg Hx   . Bell's palsy Neg Hx   . Benign prostatic hyperplasia Neg Hx   . Bipolar disorder Neg Hx   . Birth defects Neg Hx   . Birth marks Neg Hx   . Blindness Neg Hx   . Bone cancer Neg Hx   . BOR syndrome Neg Hx   . Bow legs Neg Hx   . Bradycardia Neg Hx   . Brain cancer Neg Hx   . BRCA 1/2 Neg Hx   . Breast cancer Neg Hx   . Broken bones Neg Hx   . Bronchiolitis Neg Hx   . Bronchopulmonary dysplasia Neg Hx   . Bruton's disease Neg Hx   . Bulemia Neg Hx   . Bullous pemphigoid Neg Hx   . Bunion Neg Hx   . Bursitis Neg Hx   . Caf-au-lait spots Neg Hx   . Calcium disorder Neg Hx   . Canavan disease Neg Hx   . Cancer Neg Hx   . Cardiomyopathy Neg Hx   . Carpal tunnel syndrome Neg Hx   . Cataracts Neg Hx   . Celiac  disease Neg Hx   . Cerebral aneurysm Neg Hx   . Cerebral palsy Neg Hx   .  Cervical cancer Neg Hx   . Cervical polyp Neg Hx   . Cervicitis Neg Hx   . Chalasia Neg Hx   . Chalazion Neg Hx   . Charcot-Marie-Tooth disease Neg Hx   . Chediak-Higashi syndrome Neg Hx   . Chiari malformation Neg Hx   . Childhood respiratory disease Neg Hx   . Choanal atresia Neg Hx   . Cholecystitis Neg Hx   . Cholelithiasis Neg Hx   . Cholesteatoma Neg Hx   . Chondromalacia Neg Hx   . Chorea Neg Hx   . Chromosomal disorder Neg Hx   . Chronic bronchitis Neg Hx   . Chronic fatigue Neg Hx   . Chronic granulomatous disease Neg Hx   . Chronic infections Neg Hx   . Cirrhosis Neg Hx   . Cleft lip Neg Hx   . Cleft palate Neg Hx   . Clotting disorder Neg Hx   . Club foot Neg Hx   . Coarctation of the aorta Neg Hx   . Colitis Neg Hx   . Collagen disease Neg Hx   . Colon cancer Neg Hx   . Colon polyps Neg Hx   . Colonic polyp Neg Hx   . Color blindness Neg Hx   . Conduct disorder Neg Hx   . Conductive hearing loss Neg Hx   . Congenital adrenal hyperplasia Neg Hx   . Congenital heart disease Neg Hx   . Conjunctivitis Neg Hx   . Consanguinity Neg Hx   . Constipation Neg Hx   . COPD Neg Hx   . Corneal abrasion Neg Hx   . Corneal ulcer Neg Hx   . Coronary aneurysm Neg Hx   . Coronary artery disease Neg Hx   . Cowden syndrome Neg Hx   . Craniosynostosis Neg Hx   . Cri-du-chat syndrome Neg Hx   . Crohn's disease Neg Hx   . Cushing syndrome Neg Hx   . Cystic fibrosis Neg Hx   . Cystic kidney disease Neg Hx   . Cystinosis Neg Hx   . Decreased libido Neg Hx   . Deep vein thrombosis Neg Hx   . Delayed menopause Neg Hx   . Delayed puberty Neg Hx   . Dementia Neg Hx   . Dental caries Neg Hx   . Depression Neg Hx   . Dermatomyositis Neg Hx   . DES usage Neg Hx   . Developmental delay Neg Hx   . Diabetes Neg Hx   . Diabetes insipidus Neg Hx   . Diabetes type I Neg Hx   . Diabetes type II Neg Hx   . Diabetic kidney disease Neg Hx   . DiGeorge syndrome Neg Hx   . Dilated  cardiomyopathy Neg Hx   . Dislocations Neg Hx   . Diverticulitis Neg Hx   . Diverticulosis Neg Hx   . Down syndrome Neg Hx   . Drug abuse Neg Hx   . Dupuytren's contracture Neg Hx   . Dwarfism Neg Hx   . Dysfunctional uterine bleeding Neg Hx   . Dysmenorrhea Neg Hx   . Dyspareunia Neg Hx   . Dysphagia Neg Hx   . Dysrhythmia Neg Hx   . Dystonia Neg Hx   . Early death Neg Hx   . Early menopause Neg Hx   . Early puberty Neg Hx   .  Eating disorder Neg Hx   . Eclampsia Neg Hx   . Ectodermal dysplasia Neg Hx   . Eczema Neg Hx   . Edema Neg Hx   . Edward's syndrome Neg Hx   . Ehlers-Danlos syndrome Neg Hx   . Emotional abuse Neg Hx   . Emphysema Neg Hx   . Encopresis Neg Hx   . Endocrine tumor Neg Hx   . Endocrinopathy Neg Hx   . Endometrial cancer Neg Hx   . Endometriosis Neg Hx   . Enuresis Neg Hx   . Eosinophilic granuloma Neg Hx   . Epididymitis Neg Hx   . Erectile dysfunction Neg Hx   . Erythema nodosum Neg Hx   . Esophageal cancer Neg Hx   . Esophageal varices Neg Hx   . Esophagitis Neg Hx   . Exostosis Neg Hx   . Fabry's disease Neg Hx   . Factor IX deficiency Neg Hx   . Factor V Leiden deficiency Neg Hx   . Factor VIII deficiency Neg Hx   . Failure to thrive Neg Hx   . Fainting Neg Hx   . Familial dysautonomia Neg Hx   . Familial nephritis Neg Hx   . Familial polyposis Neg Hx   . Fanconi anemia Neg Hx   . Febrile seizures Neg Hx   . Fibrocystic breast disease Neg Hx   . Fibroids Neg Hx   . Fibromyalgia Neg Hx   . Food intolerance Neg Hx   . Fragile X syndrome Neg Hx   . Friedreich's ataxia Neg Hx   . Frontotemporal dementia Neg Hx   . Fuch's dystrophy Neg Hx   . Gait disorder Neg Hx   . Galactosemia Neg Hx   . Gallbladder disease Neg Hx   . Gaucher's disease Neg Hx   . Genodermatoses Neg Hx   . GER disease Neg Hx   . Gestational diabetes Neg Hx   . GI problems Neg Hx   . Glaucoma Neg Hx   . Glomerulonephritis Neg Hx   . Glucose-6-phos deficiency  Neg Hx   . Glycogen storage disease Neg Hx   . Goiter Neg Hx   . Gonadal disorder Neg Hx   . Gout Neg Hx   . Graves' disease Neg Hx   . Growth hormone deficiency Neg Hx   . GU problems Neg Hx   . Hammer toes Neg Hx   . Hartnup's disease Neg Hx   . Hashimoto's thyroiditis Neg Hx   . Healthy Neg Hx   . Hearing loss Neg Hx   . Heart block Neg Hx   . Heart defect Neg Hx   . Heart disease Neg Hx   . Heart failure Neg Hx   . Heart murmur Neg Hx   . Hemangiomas Neg Hx   . Hematuria Neg Hx   . Hemochromatosis Neg Hx   . Hemolytic uremic syndrome Neg Hx   . Hemophilia Neg Hx   . Henoch-Schonlein purpura Neg Hx   . Hepatitis Neg Hx   . Hepatomegaly Neg Hx   . Hereditary spherocytosis Neg Hx   . Hernia Neg Hx   . Hiatal hernia Neg Hx   . High arches Neg Hx   . Hip dysplasia Neg Hx   . Hip fracture Neg Hx   . Hirschsprung's disease Neg Hx   . Hirsutism Neg Hx   . Histiocytosis X Neg Hx   . HIV Neg Hx   . HLA-B27 positive Neg  Hx   . Hodgkin's lymphoma Neg Hx   . Homocystinuria Neg Hx   . Hunter's disease Neg Hx   . Huntington's disease Neg Hx   . Hydrocele Neg Hx   . Hydrocephalus Neg Hx   . Hygroma Neg Hx   . Hypercalcemia Neg Hx   . Hypereosinophilic syndrome Neg Hx   . Hyperinsulinemia Neg Hx   . Hyperkalemia Neg Hx   . Hyperlipidemia Neg Hx   . Hypermobility Neg Hx   . Hypernasality Neg Hx   . Hyperopia Neg Hx   . Hyperparathyroidism Neg Hx   . Hypersomnolence Neg Hx   . Hypertension Neg Hx   . Hyperthyroidism Neg Hx   . Hypertrophic cardiomyopathy Neg Hx   . Hypokalemia Neg Hx   . Hypoparathyroidism Neg Hx   . Hypoplastic kidney Neg Hx   . Hypotension Neg Hx   . Hypothyroidism Neg Hx   . Idiopathic pulmonary fibrosis Neg Hx   . Idiopathic torsion dystonia Neg Hx   . IgA nephropathy Neg Hx   . Immunodeficiency Neg Hx   . Imperforate anus Neg Hx   . Impotence Neg Hx   . Impulse control disorder Neg Hx   . Incompetent cervix Neg Hx   . Infertility Neg Hx     . Inflammatory bowel disease Neg Hx   . Inguinal hernia Neg Hx   . Insomnia Neg Hx   . Insulin resistance Neg Hx   . Interstitial cystitis Neg Hx   . Intestinal malrotation Neg Hx   . Intestinal polyp Neg Hx   . Intracerebral hemorrhage Neg Hx   . Iron deficiency Neg Hx   . Irregular heart beat Neg Hx   . Irritable bowel syndrome Neg Hx   . Jaundice Neg Hx   . Job's syndrome Neg Hx   . Joint hypermobility Neg Hx   . Juvenile idiopathic arthritis Neg Hx   . Kartagener's syndrome Neg Hx   . Kawasaki disease Neg Hx   . Keloids Neg Hx   . Kennedy's disease Neg Hx   . Keratoconus Neg Hx   . Kidney cancer Neg Hx   . Kidney disease Neg Hx   . Kidney failure Neg Hx   . Kidney nephrosis Neg Hx   . Klinefelter's syndrome Neg Hx   . Krabbe disease Neg Hx   . Kyphosis Neg Hx   . Labyrinthitis Neg Hx   . Lactose intolerance Neg Hx   . Language disorder Neg Hx   . Lead poisoning Neg Hx   . Learning disabilities Neg Hx   . Legg-Calve-Perthes disease Neg Hx   . Lesch-Nyhan syndrome Neg Hx   . Leukemia Neg Hx   . Lichen planus Neg Hx   . Li-Fraumeni syndrome Neg Hx   . Liver cancer Neg Hx   . Liver disease Neg Hx   . Long QT syndrome Neg Hx   . Lumbar disc disease Neg Hx   . Lung cancer Neg Hx   . Lung disease Neg Hx   . Lupus Neg Hx   . Lymphoma Neg Hx   . Macrocephaly Neg Hx   . Macrosomia Neg Hx   . Macular degeneration Neg Hx   . Malignant hypertension Neg Hx   . Malignant hyperthermia Neg Hx   . Maple syrup urine disease Neg Hx   . Marfan syndrome Neg Hx   . Mastocytosis Neg Hx   . Melanoma Neg Hx   . Memory loss  Neg Hx   . Meniere's disease Neg Hx   . Menstrual problems Neg Hx   . Mental illness Neg Hx   . Mental retardation Neg Hx   . Metabolic syndrome Neg Hx   . Microcephaly Neg Hx   . Migraines Neg Hx   . Milk intolerance Neg Hx   . Miscarriages / Stillbirths Neg Hx   . Mitochondrial disorder Neg Hx   . Mitral valve prolapse Neg Hx   . Motor neuron disease  Neg Hx   . Movement disorder Neg Hx   . Moyamoya disease Neg Hx   . Multiple births Neg Hx   . Multiple endocrine neoplasia Neg Hx   . Multiple fractures Neg Hx   . Multiple myeloma Neg Hx   . Multiple sclerosis Neg Hx   . Muscle cancer Neg Hx   . Muscular dystrophy Neg Hx   . Myasthenia gravis Neg Hx   . Myelodysplastic syndrome Neg Hx   . Myocarditis Neg Hx   . Myoclonus Neg Hx   . Myopathy Neg Hx   . Nail disease Neg Hx   . Narcolepsy Neg Hx   . Nephritis Neg Hx   . Nephrolithiasis Neg Hx   . Nephrotic syndrome Neg Hx   . Neural tube defect Neg Hx   . Neurodegenerative disease Neg Hx   . Neurofibromatosis Neg Hx   . Neuromuscular disorder Neg Hx   . Neuropathy Neg Hx   . Neutropenia Neg Hx   . Nevi Neg Hx   . Niemann-Pick disease Neg Hx   . Night blindness Neg Hx   . Nocturnal enuresis Neg Hx   . Nystagmus Neg Hx   . Obesity Neg Hx   . OCD Neg Hx   . ODD Neg Hx   . Orchitis Neg Hx   . Osler-Weber-Rendu syndrome Neg Hx   . Osteoarthritis Neg Hx   . Osteochondroma Neg Hx   . Osteogenesis imperfecta Neg Hx   . Osteopenia Neg Hx   . Osteoporosis Neg Hx   . Osteosarcoma Neg Hx   . Osteosclerosis Neg Hx   . Other Neg Hx   . Otitis media Neg Hx   . Ovarian cancer Neg Hx   . Ovarian cysts Neg Hx   . Oxalosis Neg Hx   . Paget's disease of bone Neg Hx   . Pancreatic cancer Neg Hx   . Pancreatitis Neg Hx   . Panhypopituitarism Neg Hx   . Panic disorder Neg Hx   . Paranoid behavior Neg Hx   . Parasomnia Neg Hx   . Parkinsonism Neg Hx   . Patau's syndrome Neg Hx   . Pathological gambling Neg Hx   . PDD Neg Hx   . Pectus carinatum Neg Hx   . Pectus excavatum Neg Hx   . Pelvic inflammatory disease Neg Hx   . Pemphigus vulgaris Neg Hx   . Penile cancer Neg Hx   . Periodic limb movement Neg Hx   . Peripheral vascular disease Neg Hx   . Pernicious anemia Neg Hx   . Personality disorder Neg Hx   . Pes cavus Neg Hx   . Physical abuse Neg Hx   . Otilio Jefferson  sequence Neg Hx   . PKU Neg Hx   . Plantar fasciitis Neg Hx   . Pleurisy Neg Hx   . Pneumonia Neg Hx   . Polychondritis Neg Hx   . Polycystic kidney disease Neg Hx   .  Polycystic ovary syndrome Neg Hx   . Polycythemia Neg Hx   . Polymyalgia rheumatica Neg Hx   . Polymyositis Neg Hx   . Pompe disease Neg Hx   . Positive PPD/TB Exposure Neg Hx   . Posterior urethral valves Neg Hx   . Post-traumatic stress disorder Neg Hx   . Prader-Willi syndrome Neg Hx   . Premature birth Neg Hx   . Premature ovarian failure Neg Hx   . Preterm labor Neg Hx   . Prolactinoma Neg Hx   . Prostate cancer Neg Hx   . Prostatitis Neg Hx   . Protein C deficiency Neg Hx   . Protein S deficiency Neg Hx   . Proteinuria Neg Hx   . Prune belly syndrome Neg Hx   . Pruritis Neg Hx   . Pseudochol deficiency Neg Hx   . Pseudotumor cerebri Neg Hx   . Psoriasis Neg Hx   . Psychosis Neg Hx   . Ptosis Neg Hx   . Pulmonary embolism Neg Hx   . Pulmonary fibrosis Neg Hx   . Pyelonephritis Neg Hx   . Pyloric stenosis Neg Hx   . Pyruvate dehydrogenase deficiency Neg Hx   . Rashes / Skin problems Neg Hx   . Raynaud syndrome Neg Hx   . Rectal cancer Neg Hx   . Recurrent abdominal pain Neg Hx   . Reiter's syndrome Neg Hx   . Renal tubular acidosis Neg Hx   . Restless legs syndrome Neg Hx   . Retinal degeneration Neg Hx   . Retinal detachment Neg Hx   . Retinitis pigmentosa Neg Hx   . Retinoblastoma Neg Hx   . Retinopathy of prematurity Neg Hx   . Reye's syndrome Neg Hx   . Rheum arthritis Neg Hx   . Rheumatic fever Neg Hx   . Rheumatologic disease Neg Hx   . Rickets Neg Hx   . Rosacea Neg Hx   . Sacroiliitis Neg Hx   . Sarcoidosis Neg Hx   . Schizophrenia Neg Hx   . Scleritis Neg Hx   . Scleroderma Neg Hx   . Scoliosis Neg Hx   . Seasonal affective disorder Neg Hx   . Seizures Neg Hx   . Selective mutism Neg Hx   . Sensorineural hearing loss Neg Hx   . Severe combined immunodeficiency Neg Hx   .  Severe sprains Neg Hx   . Sexual abuse Neg Hx   . Short stature Neg Hx   . Sick sinus syndrome Neg Hx   . Sickle cell anemia Neg Hx   . Sickle cell trait Neg Hx   . SIDS Neg Hx   . Single kidney Neg Hx   . Sinusitis Neg Hx   . Sjogren's syndrome Neg Hx   . Skeletal dysplasia Neg Hx   . Skin cancer Neg Hx   . Skin telangiectasia Neg Hx   . Sleep apnea Neg Hx   . Sleep disorder Neg Hx   . Sleep walking Neg Hx   . Snoring Neg Hx   . Social phobia Neg Hx   . Speech disorder Neg Hx   . Spina bifida Neg Hx   . Spinal muscular atrophy Neg Hx   . Splenomegaly Neg Hx   . Spondyloarthropathy Neg Hx   . Spondylolisthesis Neg Hx   . Spondylolysis Neg Hx   . Squamous cell carcinoma Neg Hx   . Stevens-Johnson syndrome Neg Hx   . Stickler  syndrome Neg Hx   . Stomach cancer Neg Hx   . Strabismus Neg Hx   . Stroke Neg Hx   . Stuttering Neg Hx   . Subarachnoid hemorrhage Neg Hx   . Sudden death Neg Hx   . Suicidality Neg Hx   . Supraventricular tachycardia Neg Hx   . Swallowing difficulties Neg Hx   . Tall stature Neg Hx   . Tay-Sachs disease Neg Hx   . Testicular cancer Neg Hx   . Thalassemia Neg Hx   . Throat cancer Neg Hx   . Thrombocytopenia Neg Hx   . Thrombophilia Neg Hx   . Thrombophlebitis Neg Hx   . Thrombosis Neg Hx   . Thymic aplasia Neg Hx   . Thyroid cancer Neg Hx   . Thyroid disease Neg Hx   . Thyroid nodules Neg Hx   . Tics Neg Hx   . Tongue cancer Neg Hx   . Torticollis Neg Hx   . Tourette syndrome Neg Hx   . Tracheal cancer Neg Hx   . Tracheoesophageal fistual Neg Hx   . Tracheomalacia Neg Hx   . Transient ischemic attack Neg Hx   . Treacher Collins syndrome Neg Hx   . Tremor Neg Hx   . Tuberculosis Neg Hx   . Tuberous sclerosis Neg Hx   . Turner syndrome Neg Hx   . Ulcerative colitis Neg Hx   . Ulcers Neg Hx   . Undescended testes Neg Hx   . Unexplained death Neg Hx   . Urinary tract infection Neg Hx   . Urolithiasis Neg Hx   . Urticaria Neg Hx     . Uterine cancer Neg Hx   . Uveitis Neg Hx   . Vaginal cancer Neg Hx   . Valvular heart disease Neg Hx   . Vasculitis Neg Hx   . Velocardiofacial syndrome Neg Hx   . Venous thrombosis Neg Hx   . Verruca vulgaris  Neg Hx   . Vesicoureteral reflux Neg Hx   . Vision loss Neg Hx   . Vitamin D deficiency Neg Hx   . Vitiligo Neg Hx   . Voice disorder Neg Hx   . Von Gierke's disease Neg Hx   . Von Hippel-Lindau syndrome Neg Hx   . Von Willebrand disease Neg Hx   . Werdnig Hoffman Disease Neg Hx   . Williams syndrome Neg Hx   . Wilm's tumor Neg Hx   . Wilson's disease Neg Hx   . Wiskott-Aldrich syndrome Neg Hx   . Evelene Croon Parkinson White syndrome Neg Hx   . Xeroderma pigmentosa Neg Hx   . Heart attack Mother   . Heart attack Father     Review of Systems: The patient denies any heat or cold intolerance.  No weight gain or weight loss.  The patient denies headaches or blurry vision.  There is no cough or sputum production.  The patient denies dizziness.  There is no hematuria or hematochezia.  The patient denies any muscle aches or arthritis.  The patient denies any rash.  The patient denies frequent falling or instability.  There is no history of depression or anxiety.  All other systems were reviewed and are negative.   Physical Exam: Filed Vitals:   04/30/13 1434  BP: 122/70  Pulse: 62   the general appearance reveals a pleasant elderly woman in no distress.  She is legally blind.  Extraocular movements are full.  There is no scleral icterus.  The mouth and pharynx are normal.  The neck is supple.  The carotids reveal no bruits.  The jugular venous pressure is normal.  The  thyroid is not enlarged.  There is no lymphadenopathy.  The chest is clear to percussion and auscultation.  There are no rales or rhonchi.  Expansion of the chest is symmetrical.  The precordium is quiet.  The first heart sound is normal.  The second heart sound is physiologically split.  There is no murmur gallop rub  or click.  There is no abnormal lift or heave.  The abdomen is soft and nontender.  The bowel sounds are normal.  The liver and spleen are not enlarged.  There are no abdominal masses.  There are no abdominal bruits.  Extremities reveal good pedal pulses.  There is no phlebitis or edema.  There is no cyanosis or clubbing.  Strength is normal and symmetrical in all extremities.  There is no lateralizing weakness.  There are no sensory deficits.  The skin is warm and dry.  There is no rash.     Assessment / Plan: Continue same medication except taper and stop prednisone.  Recheck here in 6 months for office visit and EKG. She is in the process of finding a PCP. She has lost 10 pounds since we last saw her.  We want her to eat more and to not lose any more weight

## 2013-05-08 ENCOUNTER — Telehealth: Payer: Self-pay | Admitting: Cardiology

## 2013-05-08 NOTE — Telephone Encounter (Signed)
New Prob     Pt has some questions regarding one of her medications (she doesn't know which one). Please call.

## 2013-05-08 NOTE — Telephone Encounter (Signed)
Discussed patient stopping Prednisone as recommended by  Dr. [name]

## 2013-05-11 ENCOUNTER — Telehealth: Payer: Self-pay | Admitting: Cardiology

## 2013-05-11 NOTE — Telephone Encounter (Signed)
Patient states since stopping the Prednisone she is aching all over and having swelling. Will forward to  Dr. [name]

## 2013-05-11 NOTE — Telephone Encounter (Signed)
New Prob  Pt would like to speak with you, she did not say what it was concerning.

## 2013-05-11 NOTE — Telephone Encounter (Signed)
Advised patient

## 2013-05-11 NOTE — Telephone Encounter (Signed)
Okay to go back on prednisone 5 mg daily.

## 2013-05-30 ENCOUNTER — Other Ambulatory Visit: Payer: Self-pay

## 2013-05-30 DIAGNOSIS — Z1231 Encounter for screening mammogram for malignant neoplasm of breast: Secondary | ICD-10-CM

## 2013-06-19 ENCOUNTER — Ambulatory Visit
Admission: RE | Admit: 2013-06-19 | Discharge: 2013-06-19 | Disposition: A | Discharge status: Home / Self Care | Payer: Medicare Other | Source: Ambulatory Visit

## 2013-06-19 DIAGNOSIS — Z1231 Encounter for screening mammogram for malignant neoplasm of breast: Secondary | ICD-10-CM

## 2013-10-08 ENCOUNTER — Other Ambulatory Visit: Payer: Self-pay | Admitting: Rheumatology

## 2013-10-08 ENCOUNTER — Ambulatory Visit
Admission: RE | Admit: 2013-10-08 | Discharge: 2013-10-08 | Disposition: A | Discharge status: Home / Self Care | Payer: Medicare HMO | Source: Ambulatory Visit | Attending: Rheumatology | Admitting: Rheumatology

## 2013-10-08 DIAGNOSIS — M545 Low back pain, unspecified: Secondary | ICD-10-CM

## 2014-03-03 ENCOUNTER — Emergency Department (HOSPITAL_COMMUNITY): Payer: Medicare HMO

## 2014-03-03 ENCOUNTER — Inpatient Hospital Stay (HOSPITAL_COMMUNITY)
Admission: EM | Admit: 2014-03-03 | Discharge: 2014-03-11 | DRG: 520 | Disposition: A | Discharge status: Home / Self Care | Payer: Medicare HMO | Attending: Neurological Surgery | Admitting: Neurological Surgery

## 2014-03-03 ENCOUNTER — Encounter (HOSPITAL_COMMUNITY): Payer: Self-pay | Admitting: Emergency Medicine

## 2014-03-03 DIAGNOSIS — F0280 Dementia in other diseases classified elsewhere without behavioral disturbance: Secondary | ICD-10-CM | POA: Diagnosis present

## 2014-03-03 DIAGNOSIS — E039 Hypothyroidism, unspecified: Secondary | ICD-10-CM | POA: Diagnosis present

## 2014-03-03 DIAGNOSIS — M48061 Spinal stenosis, lumbar region without neurogenic claudication: Secondary | ICD-10-CM

## 2014-03-03 DIAGNOSIS — H3530 Unspecified macular degeneration: Secondary | ICD-10-CM | POA: Diagnosis present

## 2014-03-03 DIAGNOSIS — IMO0002 Reserved for concepts with insufficient information to code with codable children: Secondary | ICD-10-CM

## 2014-03-03 DIAGNOSIS — M5137 Other intervertebral disc degeneration, lumbosacral region: Secondary | ICD-10-CM | POA: Diagnosis present

## 2014-03-03 DIAGNOSIS — I10 Essential (primary) hypertension: Secondary | ICD-10-CM | POA: Diagnosis present

## 2014-03-03 DIAGNOSIS — Z9889 Other specified postprocedural states: Secondary | ICD-10-CM

## 2014-03-03 DIAGNOSIS — G8929 Other chronic pain: Secondary | ICD-10-CM | POA: Diagnosis present

## 2014-03-03 DIAGNOSIS — F70 Mild intellectual disabilities: Secondary | ICD-10-CM | POA: Diagnosis present

## 2014-03-03 DIAGNOSIS — M5126 Other intervertebral disc displacement, lumbar region: Principal | ICD-10-CM | POA: Diagnosis present

## 2014-03-03 DIAGNOSIS — M51379 Other intervertebral disc degeneration, lumbosacral region without mention of lumbar back pain or lower extremity pain: Secondary | ICD-10-CM | POA: Diagnosis present

## 2014-03-03 DIAGNOSIS — Z9181 History of falling: Secondary | ICD-10-CM

## 2014-03-03 DIAGNOSIS — Z87891 Personal history of nicotine dependence: Secondary | ICD-10-CM

## 2014-03-03 DIAGNOSIS — Z7982 Long term (current) use of aspirin: Secondary | ICD-10-CM

## 2014-03-03 DIAGNOSIS — M069 Rheumatoid arthritis, unspecified: Secondary | ICD-10-CM | POA: Diagnosis present

## 2014-03-03 DIAGNOSIS — Z79899 Other long term (current) drug therapy: Secondary | ICD-10-CM

## 2014-03-03 LAB — URINALYSIS, ROUTINE W REFLEX MICROSCOPIC
Bilirubin Urine: NEGATIVE
Bilirubin Urine: NEGATIVE
GLUCOSE, UA: NEGATIVE mg/dL
Glucose, UA: NEGATIVE mg/dL
HGB URINE DIPSTICK: NEGATIVE
KETONES UR: NEGATIVE mg/dL
Ketones, ur: NEGATIVE mg/dL
Leukocytes, UA: NEGATIVE
Nitrite: NEGATIVE
Nitrite: NEGATIVE
PH: 7.5 (ref 5.0–8.0)
PROTEIN: NEGATIVE mg/dL
Protein, ur: NEGATIVE mg/dL
SPECIFIC GRAVITY, URINE: 1.013 (ref 1.005–1.030)
Specific Gravity, Urine: 1.014 (ref 1.005–1.030)
Urobilinogen, UA: 0.2 mg/dL (ref 0.0–1.0)
Urobilinogen, UA: 0.2 mg/dL (ref 0.0–1.0)
pH: 7.5 (ref 5.0–8.0)

## 2014-03-03 LAB — COMPREHENSIVE METABOLIC PANEL
ALK PHOS: 58 U/L (ref 39–117)
ALT: 19 U/L (ref 0–35)
AST: 46 U/L — ABNORMAL HIGH (ref 0–37)
Albumin: 3.2 g/dL — ABNORMAL LOW (ref 3.5–5.2)
BUN: 14 mg/dL (ref 6–23)
CHLORIDE: 107 meq/L (ref 96–112)
CO2: 29 mEq/L (ref 19–32)
CREATININE: 0.77 mg/dL (ref 0.50–1.10)
Calcium: 10.6 mg/dL — ABNORMAL HIGH (ref 8.4–10.5)
GFR calc non Af Amer: 76 mL/min — ABNORMAL LOW (ref >90)
GFR, EST AFRICAN AMERICAN: 88 mL/min — AB (ref >90)
Glucose, Bld: 86 mg/dL (ref 70–99)
Potassium: 3.8 mEq/L (ref 3.7–5.3)
Sodium: 147 mEq/L (ref 137–147)
TOTAL PROTEIN: 6.3 g/dL (ref 6.0–8.3)
Total Bilirubin: 0.4 mg/dL (ref 0.3–1.2)

## 2014-03-03 LAB — CBC WITH DIFFERENTIAL/PLATELET
Basophils Absolute: 0 10*3/uL (ref 0.0–0.1)
Basophils Relative: 0 % (ref 0–1)
EOS ABS: 0.1 10*3/uL (ref 0.0–0.7)
Eosinophils Relative: 2 % (ref 0–5)
HEMATOCRIT: 42.8 % (ref 36.0–46.0)
HEMOGLOBIN: 14.3 g/dL (ref 12.0–15.0)
LYMPHS ABS: 2.3 10*3/uL (ref 0.7–4.0)
Lymphocytes Relative: 37 % (ref 12–46)
MCH: 36.2 pg — AB (ref 26.0–34.0)
MCHC: 33.4 g/dL (ref 30.0–36.0)
MCV: 108.4 fL — ABNORMAL HIGH (ref 78.0–100.0)
MONO ABS: 0.4 10*3/uL (ref 0.1–1.0)
MONOS PCT: 6 % (ref 3–12)
Neutro Abs: 3.5 10*3/uL (ref 1.7–7.7)
Neutrophils Relative %: 55 % (ref 43–77)
Platelets: 118 10*3/uL — ABNORMAL LOW (ref 150–400)
RBC: 3.95 MIL/uL (ref 3.87–5.11)
RDW: 14.5 % (ref 11.5–15.5)
WBC: 6.3 10*3/uL (ref 4.0–10.5)

## 2014-03-03 LAB — URINE MICROSCOPIC-ADD ON

## 2014-03-03 LAB — CK: Total CK: 37 U/L (ref 7–177)

## 2014-03-03 MED ORDER — SODIUM CHLORIDE 0.9 % IV BOLUS (SEPSIS)
500.0000 mL | Freq: Once | INTRAVENOUS | Status: AC
  Administered 2014-03-03: 500 mL via INTRAVENOUS

## 2014-03-03 MED ORDER — ACETAMINOPHEN 325 MG PO TABS
650.0000 mg | ORAL_TABLET | Freq: Once | ORAL | Status: AC
  Administered 2014-03-03: 650 mg via ORAL
  Filled 2014-03-03: qty 2

## 2014-03-03 MED ORDER — ONDANSETRON HCL 4 MG/2ML IJ SOLN
4.0000 mg | Freq: Once | INTRAMUSCULAR | Status: AC
  Administered 2014-03-03: 4 mg via INTRAVENOUS
  Filled 2014-03-03: qty 2

## 2014-03-03 MED ORDER — ALENDRONATE SODIUM 70 MG PO TABS: 70.0000 mg | ORAL_TABLET | ORAL | Status: DC

## 2014-03-03 MED ORDER — ACETAMINOPHEN 650 MG RE SUPP: 650.0000 mg | RECTAL | Status: DC | PRN

## 2014-03-03 MED ORDER — HYDROCHLOROTHIAZIDE 10 MG/ML ORAL SUSPENSION
6.2500 mg | Freq: Every day | ORAL | Status: DC
  Administered 2014-03-03 – 2014-03-11 (×7): 6.25 mg via ORAL
  Filled 2014-03-03 (×10): qty 1.25

## 2014-03-03 MED ORDER — DILTIAZEM HCL ER COATED BEADS 240 MG PO CP24
240.0000 mg | ORAL_CAPSULE | Freq: Every day | ORAL | Status: DC
  Administered 2014-03-03 – 2014-03-04 (×2): 240 mg via ORAL
  Filled 2014-03-03 (×4): qty 1

## 2014-03-03 MED ORDER — SODIUM CHLORIDE 0.9 % IV SOLN: 250.0000 mL | INTRAVENOUS | Status: DC

## 2014-03-03 MED ORDER — MAGNESIUM CITRATE PO SOLN
1.0000 | Freq: Once | ORAL | Status: AC | PRN
  Filled 2014-03-03: qty 296

## 2014-03-03 MED ORDER — ASPIRIN EC 81 MG PO TBEC
81.0000 mg | DELAYED_RELEASE_TABLET | Freq: Every day | ORAL | Status: DC
  Administered 2014-03-03 – 2014-03-05 (×3): 81 mg via ORAL
  Filled 2014-03-03 (×4): qty 1

## 2014-03-03 MED ORDER — SODIUM CHLORIDE 0.9 % IJ SOLN
3.0000 mL | Freq: Two times a day (BID) | INTRAMUSCULAR | Status: DC
  Administered 2014-03-03 – 2014-03-06 (×3): 3 mL via INTRAVENOUS

## 2014-03-03 MED ORDER — POTASSIUM CHLORIDE IN NACL 20-0.9 MEQ/L-% IV SOLN
INTRAVENOUS | Status: DC
  Administered 2014-03-03 – 2014-03-04 (×2): via INTRAVENOUS
  Filled 2014-03-03 (×5): qty 1000

## 2014-03-03 MED ORDER — LEVOTHYROXINE SODIUM 100 MCG PO TABS
100.0000 ug | ORAL_TABLET | ORAL | Status: DC
  Administered 2014-03-04 – 2014-03-11 (×3): 100 ug via ORAL
  Filled 2014-03-03 (×3): qty 1

## 2014-03-03 MED ORDER — MORPHINE SULFATE 2 MG/ML IJ SOLN
1.0000 mg | Freq: Once | INTRAMUSCULAR | Status: DC
  Filled 2014-03-03: qty 1

## 2014-03-03 MED ORDER — BISOPROLOL-HYDROCHLOROTHIAZIDE 5-6.25 MG PO TABS: 1.0000 | ORAL_TABLET | Freq: Every day | ORAL | Status: DC

## 2014-03-03 MED ORDER — HYDROMORPHONE HCL PF 1 MG/ML IJ SOLN
0.5000 mg | INTRAMUSCULAR | Status: DC | PRN
  Administered 2014-03-03 (×2): 0.5 mg via INTRAVENOUS
  Filled 2014-03-03 (×2): qty 1

## 2014-03-03 MED ORDER — CEFTRIAXONE SODIUM 1 G IJ SOLR
1.0000 g | Freq: Once | INTRAMUSCULAR | Status: DC
  Filled 2014-03-03: qty 10

## 2014-03-03 MED ORDER — ACETAMINOPHEN 325 MG PO TABS: 650.0000 mg | ORAL_TABLET | ORAL | Status: DC | PRN

## 2014-03-03 MED ORDER — BISOPROLOL FUMARATE 5 MG PO TABS
5.0000 mg | ORAL_TABLET | Freq: Every day | ORAL | Status: DC
  Administered 2014-03-03 – 2014-03-11 (×7): 5 mg via ORAL
  Filled 2014-03-03 (×9): qty 1

## 2014-03-03 MED ORDER — PHENOL 1.4 % MT LIQD: 1.0000 | OROMUCOSAL | Status: DC | PRN

## 2014-03-03 MED ORDER — SENNA 8.6 MG PO TABS
1.0000 | ORAL_TABLET | Freq: Two times a day (BID) | ORAL | Status: DC
  Administered 2014-03-03 – 2014-03-05 (×5): 8.6 mg via ORAL
  Filled 2014-03-03 (×7): qty 1

## 2014-03-03 MED ORDER — SODIUM CHLORIDE 0.9 % IJ SOLN: 3.0000 mL | INTRAMUSCULAR | Status: DC | PRN

## 2014-03-03 MED ORDER — ONDANSETRON HCL 4 MG/2ML IJ SOLN: 4.0000 mg | INTRAMUSCULAR | Status: DC | PRN

## 2014-03-03 MED ORDER — MORPHINE SULFATE 2 MG/ML IJ SOLN
1.0000 mg | Freq: Once | INTRAMUSCULAR | Status: AC
  Administered 2014-03-03: 1 mg via INTRAVENOUS
  Filled 2014-03-03: qty 1

## 2014-03-03 MED ORDER — MENTHOL 3 MG MT LOZG: 1.0000 | LOZENGE | OROMUCOSAL | Status: DC | PRN

## 2014-03-03 MED ORDER — OXYCODONE-ACETAMINOPHEN 5-325 MG PO TABS
1.0000 | ORAL_TABLET | ORAL | Status: DC | PRN
  Administered 2014-03-03 – 2014-03-05 (×4): 1 via ORAL
  Filled 2014-03-03 (×5): qty 1

## 2014-03-03 MED ORDER — LEVOTHYROXINE SODIUM 50 MCG PO TABS
50.0000 ug | ORAL_TABLET | ORAL | Status: DC
  Administered 2014-03-05: 50 ug via ORAL
  Filled 2014-03-03 (×3): qty 1

## 2014-03-03 MED ORDER — PREDNISONE 5 MG PO TABS
5.0000 mg | ORAL_TABLET | Freq: Every day | ORAL | Status: DC
  Administered 2014-03-03 – 2014-03-05 (×3): 5 mg via ORAL
  Filled 2014-03-03 (×4): qty 1

## 2014-03-03 MED ORDER — FOLIC ACID 1 MG PO TABS
1.0000 mg | ORAL_TABLET | Freq: Every day | ORAL | Status: DC
  Administered 2014-03-03 – 2014-03-05 (×3): 1 mg via ORAL
  Filled 2014-03-03 (×4): qty 1

## 2014-03-03 NOTE — ED Notes (Signed)
Ambulated pt with a walker. Pt did good.

## 2014-03-03 NOTE — ED Notes (Signed)
Patient transported to X-ray 

## 2014-03-03 NOTE — Progress Notes (Signed)
Pt transferred to 4N25 from the ED, stable upon admission.  Welcomed and oriented to the unit and her room.  Vitals and assessment completed.  Pain assessed and medication given, call bell within reach. Sondra Come, RN

## 2014-03-03 NOTE — ED Notes (Signed)
Patient transported to MRI 

## 2014-03-03 NOTE — ED Provider Notes (Signed)
CSN: [id number]     Arrival date & time 03/03/14  0448 History   First MD Initiated Contact with Patient 03/03/14 0602     Chief Complaint  Patient presents with  . Back Pain  . Joint Pain     (Consider location/radiation/quality/duration/timing/severity/associated sxs/prior Treatment) HPI [name] presents today complaining of two day history of worsening back and lower leg pain bilaterally.  She states she has had pain in this area before.  She denies any known causative factors.  She states she has pain from the knees down and she cannot move this area secondary to the pain.  She has a history of athritis with similar symptoms.   Past Medical History  Diagnosis Date  . Hypertension   . Rheumatoid arteritis   . Phlebitis   . Deep vein thrombophlebitis of leg   . Fractured pelvis     fall 2011  . Thyroid disease     hypothyroidism  . Humerus fracture   . Thrombocytopenia   . Leukocytosis   . Hypothyroidism   . Phlebitis      Recurrent phlebitis  . Hematoma      Right superior and inferior pubic rami fractures with hematoma adjacent to the right ramus fracture  . Urinary tract infection   . Humerus fracture      Right proximal humerus fracture  . Osteoarthritis     for which she takes chronic prednisone  . Macular degeneration   . Chronic anticoagulation   . Fracture of multiple pubic rami 01/16/2013  . Family history of anesthesia complication     SISTER HAS NAUSEA   . Dementia     MILD   Past Surgical History  Procedure Laterality Date  . Left hip open reduction    . Cholecystectomy    . Abdominal hysterectomy  s  . Sinus surgery with instatrak     Family History  Problem Relation Age of Onset  . Abnormal EKG Neg Hx   . Abnormal newborn screen Neg Hx   . Achalasia Neg Hx   . Achondroplasia Neg Hx   . Acne Neg Hx   . Acromegaly Neg Hx   . Actinic keratosis Neg Hx   . Acute lymphoblastic leukemia Neg Hx   . Addison's disease Neg Hx   . Adrenal  disorder Neg Hx   . Albinism Neg Hx   . Alcohol abuse Neg Hx   . Alkaptonuria Neg Hx   . Allergic rhinitis Neg Hx   . Allergies Neg Hx   . Allergy (severe) Neg Hx   . Alopecia Neg Hx   . Alpha-1 antitrypsin deficiency Neg Hx   . Alport syndrome Neg Hx   . ALS Neg Hx   . Alzheimer's disease Neg Hx   . Ambiguous genitalia Neg Hx   . Amblyopia Neg Hx   . Amenorrhea Neg Hx   . Anal fissures Neg Hx   . Anemia Neg Hx   . Anesthesia problems Neg Hx   . Aneurysm Neg Hx   . Angelman syndrome Neg Hx   . Angina Neg Hx   . Angioedema Neg Hx   . Ankylosing spondylitis Neg Hx   . Anorectal malformation Neg Hx   . Anorexia nervosa Neg Hx   . Anti-cardiolipin syndrome Neg Hx   . Antithrombin III deficiency Neg Hx   . Anuerysm Neg Hx   . Anxiety disorder Neg Hx   . Aortic aneurysm Neg Hx   . Aortic dissection  Neg Hx   . Aortic stenosis Neg Hx   . Aphthous stomatitis Neg Hx   . Aplastic anemia Neg Hx   . Appendicitis Neg Hx   . Apraxia Neg Hx   . Arnold-Chiari malformation Neg Hx   . Arrhythmia Neg Hx   . Arthritis Neg Hx   . Asperger's syndrome Neg Hx   . Asthma Neg Hx   . Ataxia Neg Hx   . Ataxia telangiectasia Neg Hx   . Atopy Neg Hx   . Atrial fibrillation Neg Hx   . Atrophic kidney Neg Hx   . Auditory processing disorder Neg Hx   . Autism Neg Hx   . Autism spectrum disorder Neg Hx   . Autoimmune disease Neg Hx   . AVM Neg Hx   . Baker's cyst Neg Hx   . Barrett's esophagus Neg Hx   . Bartter's syndrome Neg Hx   . Basal cell carcinoma Neg Hx   . Behavior problems Neg Hx   . Bell's palsy Neg Hx   . Benign prostatic hyperplasia Neg Hx   . Bipolar disorder Neg Hx   . Birth defects Neg Hx   . Birth marks Neg Hx   . Blindness Neg Hx   . Bone cancer Neg Hx   . BOR syndrome Neg Hx   . Bow legs Neg Hx   . Bradycardia Neg Hx   . Brain cancer Neg Hx   . BRCA 1/2 Neg Hx   . Breast cancer Neg Hx   . Broken bones Neg Hx   . Bronchiolitis Neg Hx   . Bronchopulmonary  dysplasia Neg Hx   . Bruton's disease Neg Hx   . Bulemia Neg Hx   . Bullous pemphigoid Neg Hx   . Bunion Neg Hx   . Bursitis Neg Hx   . Caf-au-lait spots Neg Hx   . Calcium disorder Neg Hx   . Canavan disease Neg Hx   . Cancer Neg Hx   . Cardiomyopathy Neg Hx   . Carpal tunnel syndrome Neg Hx   . Cataracts Neg Hx   . Celiac disease Neg Hx   . Cerebral aneurysm Neg Hx   . Cerebral palsy Neg Hx   . Cervical cancer Neg Hx   . Cervical polyp Neg Hx   . Cervicitis Neg Hx   . Chalasia Neg Hx   . Chalazion Neg Hx   . Charcot-Marie-Tooth disease Neg Hx   . Chediak-Higashi syndrome Neg Hx   . Chiari malformation Neg Hx   . Childhood respiratory disease Neg Hx   . Choanal atresia Neg Hx   . Cholecystitis Neg Hx   . Cholelithiasis Neg Hx   . Cholesteatoma Neg Hx   . Chondromalacia Neg Hx   . Chorea Neg Hx   . Chromosomal disorder Neg Hx   . Chronic bronchitis Neg Hx   . Chronic fatigue Neg Hx   . Chronic granulomatous disease Neg Hx   . Chronic infections Neg Hx   . Cirrhosis Neg Hx   . Cleft lip Neg Hx   . Cleft palate Neg Hx   . Clotting disorder Neg Hx   . Club foot Neg Hx   . Coarctation of the aorta Neg Hx   . Colitis Neg Hx   . Collagen disease Neg Hx   . Colon cancer Neg Hx   . Colon polyps Neg Hx   . Colonic polyp Neg Hx   . Color blindness Neg Hx   .  Conduct disorder Neg Hx   . Conductive hearing loss Neg Hx   . Congenital adrenal hyperplasia Neg Hx   . Congenital heart disease Neg Hx   . Conjunctivitis Neg Hx   . Consanguinity Neg Hx   . Constipation Neg Hx   . COPD Neg Hx   . Corneal abrasion Neg Hx   . Corneal ulcer Neg Hx   . Coronary aneurysm Neg Hx   . Coronary artery disease Neg Hx   . Cowden syndrome Neg Hx   . Craniosynostosis Neg Hx   . Cri-du-chat syndrome Neg Hx   . Crohn's disease Neg Hx   . Cushing syndrome Neg Hx   . Cystic fibrosis Neg Hx   . Cystic kidney disease Neg Hx   . Cystinosis Neg Hx   . Decreased libido Neg Hx   . Deep vein  thrombosis Neg Hx   . Delayed menopause Neg Hx   . Delayed puberty Neg Hx   . Dementia Neg Hx   . Dental caries Neg Hx   . Depression Neg Hx   . Dermatomyositis Neg Hx   . DES usage Neg Hx   . Developmental delay Neg Hx   . Diabetes Neg Hx   . Diabetes insipidus Neg Hx   . Diabetes type I Neg Hx   . Diabetes type II Neg Hx   . Diabetic kidney disease Neg Hx   . DiGeorge syndrome Neg Hx   . Dilated cardiomyopathy Neg Hx   . Dislocations Neg Hx   . Diverticulitis Neg Hx   . Diverticulosis Neg Hx   . Down syndrome Neg Hx   . Drug abuse Neg Hx   . Dupuytren's contracture Neg Hx   . Dwarfism Neg Hx   . Dysfunctional uterine bleeding Neg Hx   . Dysmenorrhea Neg Hx   . Dyspareunia Neg Hx   . Dysphagia Neg Hx   . Dysrhythmia Neg Hx   . Dystonia Neg Hx   . Early death Neg Hx   . Early menopause Neg Hx   . Early puberty Neg Hx   . Eating disorder Neg Hx   . Eclampsia Neg Hx   . Ectodermal dysplasia Neg Hx   . Eczema Neg Hx   . Edema Neg Hx   . Edward's syndrome Neg Hx   . Ehlers-Danlos syndrome Neg Hx   . Emotional abuse Neg Hx   . Emphysema Neg Hx   . Encopresis Neg Hx   . Endocrine tumor Neg Hx   . Endocrinopathy Neg Hx   . Endometrial cancer Neg Hx   . Endometriosis Neg Hx   . Enuresis Neg Hx   . Eosinophilic granuloma Neg Hx   . Epididymitis Neg Hx   . Erectile dysfunction Neg Hx   . Erythema nodosum Neg Hx   . Esophageal cancer Neg Hx   . Esophageal varices Neg Hx   . Esophagitis Neg Hx   . Exostosis Neg Hx   . Fabry's disease Neg Hx   . Factor IX deficiency Neg Hx   . Factor V Leiden deficiency Neg Hx   . Factor VIII deficiency Neg Hx   . Failure to thrive Neg Hx   . Fainting Neg Hx   . Familial dysautonomia Neg Hx   . Familial nephritis Neg Hx   . Familial polyposis Neg Hx   . Fanconi anemia Neg Hx   . Febrile seizures Neg Hx   . Fibrocystic breast disease Neg Hx   .  Fibroids Neg Hx   . Fibromyalgia Neg Hx   . Food intolerance Neg Hx   . Fragile X  syndrome Neg Hx   . Friedreich's ataxia Neg Hx   . Frontotemporal dementia Neg Hx   . Fuch's dystrophy Neg Hx   . Gait disorder Neg Hx   . Galactosemia Neg Hx   . Gallbladder disease Neg Hx   . Gaucher's disease Neg Hx   . Genodermatoses Neg Hx   . GER disease Neg Hx   . Gestational diabetes Neg Hx   . GI problems Neg Hx   . Glaucoma Neg Hx   . Glomerulonephritis Neg Hx   . Glucose-6-phos deficiency Neg Hx   . Glycogen storage disease Neg Hx   . Goiter Neg Hx   . Gonadal disorder Neg Hx   . Gout Neg Hx   . Graves' disease Neg Hx   . Growth hormone deficiency Neg Hx   . GU problems Neg Hx   . Hammer toes Neg Hx   . Hartnup's disease Neg Hx   . Hashimoto's thyroiditis Neg Hx   . Healthy Neg Hx   . Hearing loss Neg Hx   . Heart block Neg Hx   . Heart defect Neg Hx   . Heart disease Neg Hx   . Heart failure Neg Hx   . Heart murmur Neg Hx   . Hemangiomas Neg Hx   . Hematuria Neg Hx   . Hemochromatosis Neg Hx   . Hemolytic uremic syndrome Neg Hx   . Hemophilia Neg Hx   . Henoch-Schonlein purpura Neg Hx   . Hepatitis Neg Hx   . Hepatomegaly Neg Hx   . Hereditary spherocytosis Neg Hx   . Hernia Neg Hx   . Hiatal hernia Neg Hx   . High arches Neg Hx   . Hip dysplasia Neg Hx   . Hip fracture Neg Hx   . Hirschsprung's disease Neg Hx   . Hirsutism Neg Hx   . Histiocytosis X Neg Hx   . HIV Neg Hx   . HLA-B27 positive Neg Hx   . Hodgkin's lymphoma Neg Hx   . Homocystinuria Neg Hx   . Hunter's disease Neg Hx   . Huntington's disease Neg Hx   . Hydrocele Neg Hx   . Hydrocephalus Neg Hx   . Hygroma Neg Hx   . Hypercalcemia Neg Hx   . Hypereosinophilic syndrome Neg Hx   . Hyperinsulinemia Neg Hx   . Hyperkalemia Neg Hx   . Hyperlipidemia Neg Hx   . Hypermobility Neg Hx   . Hypernasality Neg Hx   . Hyperopia Neg Hx   . Hyperparathyroidism Neg Hx   . Hypersomnolence Neg Hx   . Hypertension Neg Hx   . Hyperthyroidism Neg Hx   . Hypertrophic cardiomyopathy Neg Hx   .  Hypokalemia Neg Hx   . Hypoparathyroidism Neg Hx   . Hypoplastic kidney Neg Hx   . Hypotension Neg Hx   . Hypothyroidism Neg Hx   . Idiopathic pulmonary fibrosis Neg Hx   . Idiopathic torsion dystonia Neg Hx   . IgA nephropathy Neg Hx   . Immunodeficiency Neg Hx   . Imperforate anus Neg Hx   . Impotence Neg Hx   . Impulse control disorder Neg Hx   . Incompetent cervix Neg Hx   . Infertility Neg Hx   . Inflammatory bowel disease Neg Hx   . Inguinal hernia Neg Hx   .  Insomnia Neg Hx   . Insulin resistance Neg Hx   . Interstitial cystitis Neg Hx   . Intestinal malrotation Neg Hx   . Intestinal polyp Neg Hx   . Intracerebral hemorrhage Neg Hx   . Iron deficiency Neg Hx   . Irregular heart beat Neg Hx   . Irritable bowel syndrome Neg Hx   . Jaundice Neg Hx   . Job's syndrome Neg Hx   . Joint hypermobility Neg Hx   . Juvenile idiopathic arthritis Neg Hx   . Kartagener's syndrome Neg Hx   . Kawasaki disease Neg Hx   . Keloids Neg Hx   . Kennedy's disease Neg Hx   . Keratoconus Neg Hx   . Kidney cancer Neg Hx   . Kidney disease Neg Hx   . Kidney failure Neg Hx   . Kidney nephrosis Neg Hx   . Klinefelter's syndrome Neg Hx   . Krabbe disease Neg Hx   . Kyphosis Neg Hx   . Labyrinthitis Neg Hx   . Lactose intolerance Neg Hx   . Language disorder Neg Hx   . Lead poisoning Neg Hx   . Learning disabilities Neg Hx   . Legg-Calve-Perthes disease Neg Hx   . Lesch-Nyhan syndrome Neg Hx   . Leukemia Neg Hx   . Lichen planus Neg Hx   . Li-Fraumeni syndrome Neg Hx   . Liver cancer Neg Hx   . Liver disease Neg Hx   . Long QT syndrome Neg Hx   . Lumbar disc disease Neg Hx   . Lung cancer Neg Hx   . Lung disease Neg Hx   . Lupus Neg Hx   . Lymphoma Neg Hx   . Macrocephaly Neg Hx   . Macrosomia Neg Hx   . Macular degeneration Neg Hx   . Malignant hypertension Neg Hx   . Malignant hyperthermia Neg Hx   . Maple syrup urine disease Neg Hx   . Marfan syndrome Neg Hx   .  Mastocytosis Neg Hx   . Melanoma Neg Hx   . Memory loss Neg Hx   . Meniere's disease Neg Hx   . Menstrual problems Neg Hx   . Mental illness Neg Hx   . Mental retardation Neg Hx   . Metabolic syndrome Neg Hx   . Microcephaly Neg Hx   . Migraines Neg Hx   . Milk intolerance Neg Hx   . Miscarriages / Stillbirths Neg Hx   . Mitochondrial disorder Neg Hx   . Mitral valve prolapse Neg Hx   . Motor neuron disease Neg Hx   . Movement disorder Neg Hx   . Moyamoya disease Neg Hx   . Multiple births Neg Hx   . Multiple endocrine neoplasia Neg Hx   . Multiple fractures Neg Hx   . Multiple myeloma Neg Hx   . Multiple sclerosis Neg Hx   . Muscle cancer Neg Hx   . Muscular dystrophy Neg Hx   . Myasthenia gravis Neg Hx   . Myelodysplastic syndrome Neg Hx   . Myocarditis Neg Hx   . Myoclonus Neg Hx   . Myopathy Neg Hx   . Nail disease Neg Hx   . Narcolepsy Neg Hx   . Nephritis Neg Hx   . Nephrolithiasis Neg Hx   . Nephrotic syndrome Neg Hx   . Neural tube defect Neg Hx   . Neurodegenerative disease Neg Hx   . Neurofibromatosis Neg Hx   .  Neuromuscular disorder Neg Hx   . Neuropathy Neg Hx   . Neutropenia Neg Hx   . Nevi Neg Hx   . Niemann-Pick disease Neg Hx   . Night blindness Neg Hx   . Nocturnal enuresis Neg Hx   . Nystagmus Neg Hx   . Obesity Neg Hx   . OCD Neg Hx   . ODD Neg Hx   . Orchitis Neg Hx   . Osler-Weber-Rendu syndrome Neg Hx   . Osteoarthritis Neg Hx   . Osteochondroma Neg Hx   . Osteogenesis imperfecta Neg Hx   . Osteopenia Neg Hx   . Osteoporosis Neg Hx   . Osteosarcoma Neg Hx   . Osteosclerosis Neg Hx   . Other Neg Hx   . Otitis media Neg Hx   . Ovarian cancer Neg Hx   . Ovarian cysts Neg Hx   . Oxalosis Neg Hx   . Paget's disease of bone Neg Hx   . Pancreatic cancer Neg Hx   . Pancreatitis Neg Hx   . Panhypopituitarism Neg Hx   . Panic disorder Neg Hx   . Paranoid behavior Neg Hx   . Parasomnia Neg Hx   . Parkinsonism Neg Hx   . Patau's  syndrome Neg Hx   . Pathological gambling Neg Hx   . PDD Neg Hx   . Pectus carinatum Neg Hx   . Pectus excavatum Neg Hx   . Pelvic inflammatory disease Neg Hx   . Pemphigus vulgaris Neg Hx   . Penile cancer Neg Hx   . Periodic limb movement Neg Hx   . Peripheral vascular disease Neg Hx   . Pernicious anemia Neg Hx   . Personality disorder Neg Hx   . Pes cavus Neg Hx   . Physical abuse Neg Hx   . Polo Riley sequence Neg Hx   . PKU Neg Hx   . Plantar fasciitis Neg Hx   . Pleurisy Neg Hx   . Pneumonia Neg Hx   . Polychondritis Neg Hx   . Polycystic kidney disease Neg Hx   . Polycystic ovary syndrome Neg Hx   . Polycythemia Neg Hx   . Polymyalgia rheumatica Neg Hx   . Polymyositis Neg Hx   . Pompe disease Neg Hx   . Positive PPD/TB Exposure Neg Hx   . Posterior urethral valves Neg Hx   . Post-traumatic stress disorder Neg Hx   . Prader-Willi syndrome Neg Hx   . Premature birth Neg Hx   . Premature ovarian failure Neg Hx   . Preterm labor Neg Hx   . Prolactinoma Neg Hx   . Prostate cancer Neg Hx   . Prostatitis Neg Hx   . Protein C deficiency Neg Hx   . Protein S deficiency Neg Hx   . Proteinuria Neg Hx   . Prune belly syndrome Neg Hx   . Pruritis Neg Hx   . Pseudochol deficiency Neg Hx   . Pseudotumor cerebri Neg Hx   . Psoriasis Neg Hx   . Psychosis Neg Hx   . Ptosis Neg Hx   . Pulmonary embolism Neg Hx   . Pulmonary fibrosis Neg Hx   . Pyelonephritis Neg Hx   . Pyloric stenosis Neg Hx   . Pyruvate dehydrogenase deficiency Neg Hx   . Rashes / Skin problems Neg Hx   . Raynaud syndrome Neg Hx   . Rectal cancer Neg Hx   . Recurrent abdominal pain Neg Hx   .  Reiter's syndrome Neg Hx   . Renal tubular acidosis Neg Hx   . Restless legs syndrome Neg Hx   . Retinal degeneration Neg Hx   . Retinal detachment Neg Hx   . Retinitis pigmentosa Neg Hx   . Retinoblastoma Neg Hx   . Retinopathy of prematurity Neg Hx   . Reye's syndrome Neg Hx   . Rheum arthritis Neg Hx    . Rheumatic fever Neg Hx   . Rheumatologic disease Neg Hx   . Rickets Neg Hx   . Rosacea Neg Hx   . Sacroiliitis Neg Hx   . Sarcoidosis Neg Hx   . Schizophrenia Neg Hx   . Scleritis Neg Hx   . Scleroderma Neg Hx   . Scoliosis Neg Hx   . Seasonal affective disorder Neg Hx   . Seizures Neg Hx   . Selective mutism Neg Hx   . Sensorineural hearing loss Neg Hx   . Severe combined immunodeficiency Neg Hx   . Severe sprains Neg Hx   . Sexual abuse Neg Hx   . Short stature Neg Hx   . Sick sinus syndrome Neg Hx   . Sickle cell anemia Neg Hx   . Sickle cell trait Neg Hx   . SIDS Neg Hx   . Single kidney Neg Hx   . Sinusitis Neg Hx   . Sjogren's syndrome Neg Hx   . Skeletal dysplasia Neg Hx   . Skin cancer Neg Hx   . Skin telangiectasia Neg Hx   . Sleep apnea Neg Hx   . Sleep disorder Neg Hx   . Sleep walking Neg Hx   . Snoring Neg Hx   . Social phobia Neg Hx   . Speech disorder Neg Hx   . Spina bifida Neg Hx   . Spinal muscular atrophy Neg Hx   . Splenomegaly Neg Hx   . Spondyloarthropathy Neg Hx   . Spondylolisthesis Neg Hx   . Spondylolysis Neg Hx   . Squamous cell carcinoma Neg Hx   . Stevens-Johnson syndrome Neg Hx   . Stickler syndrome Neg Hx   . Stomach cancer Neg Hx   . Strabismus Neg Hx   . Stroke Neg Hx   . Stuttering Neg Hx   . Subarachnoid hemorrhage Neg Hx   . Sudden death Neg Hx   . Suicidality Neg Hx   . Supraventricular tachycardia Neg Hx   . Swallowing difficulties Neg Hx   . Tall stature Neg Hx   . Tay-Sachs disease Neg Hx   . Testicular cancer Neg Hx   . Thalassemia Neg Hx   . Throat cancer Neg Hx   . Thrombocytopenia Neg Hx   . Thrombophilia Neg Hx   . Thrombophlebitis Neg Hx   . Thrombosis Neg Hx   . Thymic aplasia Neg Hx   . Thyroid cancer Neg Hx   . Thyroid disease Neg Hx   . Thyroid nodules Neg Hx   . Tics Neg Hx   . Tongue cancer Neg Hx   . Torticollis Neg Hx   . Tourette syndrome Neg Hx   . Tracheal cancer Neg Hx   .  Tracheoesophageal fistual Neg Hx   . Tracheomalacia Neg Hx   . Transient ischemic attack Neg Hx   . Treacher Collins syndrome Neg Hx   . Tremor Neg Hx   . Tuberculosis Neg Hx   . Tuberous sclerosis Neg Hx   . Turner syndrome Neg Hx   .  Ulcerative colitis Neg Hx   . Ulcers Neg Hx   . Undescended testes Neg Hx   . Unexplained death Neg Hx   . Urinary tract infection Neg Hx   . Urolithiasis Neg Hx   . Urticaria Neg Hx   . Uterine cancer Neg Hx   . Uveitis Neg Hx   . Vaginal cancer Neg Hx   . Valvular heart disease Neg Hx   . Vasculitis Neg Hx   . Velocardiofacial syndrome Neg Hx   . Venous thrombosis Neg Hx   . Verruca vulgaris  Neg Hx   . Vesicoureteral reflux Neg Hx   . Vision loss Neg Hx   . Vitamin D deficiency Neg Hx   . Vitiligo Neg Hx   . Voice disorder Neg Hx   . Von Gierke's disease Neg Hx   . Von Hippel-Lindau syndrome Neg Hx   . Von Willebrand disease Neg Hx   . Werdnig Hoffman Disease Neg Hx   . Williams syndrome Neg Hx   . Wilm's tumor Neg Hx   . Wilson's disease Neg Hx   . Wiskott-Aldrich syndrome Neg Hx   . Evelene Croon Parkinson White syndrome Neg Hx   . Xeroderma pigmentosa Neg Hx   . Heart attack Mother   . Heart attack Father    History  Substance Use Topics  . Smoking status: Former Games developer  . Smokeless tobacco: Never Used  . Alcohol Use: No   OB History   Grav Para Term Preterm Abortions TAB SAB Ect Mult Living                 Review of Systems    Allergies  Morphine and related; Tramadol; and Penicillins  Home Medications   Prior to Admission medications   Medication Sig Start Date End Date Taking? Authorizing Provider  alendronate (FOSAMAX) 70 MG tablet Take 70 mg by mouth once a week. Take on Monday.Take with a full glass of water on an empty stomach.   Yes Historical Provider, MD  aspirin EC 81 MG tablet Take 81 mg by mouth daily.   Yes Historical Provider, MD  bisoprolol-hydrochlorothiazide (ZIAC) 5-6.25 MG per tablet Take 1 tablet by  mouth daily.   Yes Historical Provider, MD  diazepam (VALIUM) 5 MG tablet Take 1 tablet (5 mg total) by mouth at bedtime. 01/18/13  Yes Suszanne Conners Carnaghi, PA-C  diltiazem (CARDIZEM CD) 240 MG 24 hr capsule Take 240 mg by mouth daily. 11/08/12  Yes [name], MD  folic acid (FOLVITE) 1 MG tablet Take 1 mg by mouth daily.   Yes Historical Provider, MD  levothyroxine (SYNTHROID, LEVOTHROID) 50 MCG tablet Take 50-100 mcg by mouth See admin instructions. Takes 1 tablet daily except on Monday and Friday when she takes 2 tablets 11/12/11  Yes [name], MD  predniSONE (DELTASONE) 5 MG tablet Take 5 mg by mouth daily.   Yes Historical Provider, MD   BP 151/62  Pulse 65  Temp(Src) 98.1 F (36.7 C) (Rectal)  Resp 20  SpO2 97% Physical Exam  Nursing note and vitals reviewed. Constitutional: She is oriented to person, place, and time. She appears well-developed and well-nourished.  HENT:  Head: Normocephalic and atraumatic.  Right Ear: Tympanic membrane and external ear normal.  Left Ear: Tympanic membrane and external ear normal.  Nose: Nose normal. Right sinus exhibits no maxillary sinus tenderness and no frontal sinus tenderness. Left sinus exhibits no maxillary sinus tenderness and no frontal sinus tenderness.  Eyes: Conjunctivae and  EOM are normal. Pupils are equal, round, and reactive to light. Right eye exhibits no nystagmus. Left eye exhibits no nystagmus.  Neck: Normal range of motion. Neck supple.  Cardiovascular: Normal rate, regular rhythm, normal heart sounds and intact distal pulses.   Pulmonary/Chest: Effort normal and breath sounds normal. No respiratory distress. She exhibits no tenderness.  Abdominal: Soft. Bowel sounds are normal. She exhibits no distension and no mass. There is no tenderness.  Musculoskeletal: Normal range of motion. She exhibits no edema and no tenderness.  Neurological: She is alert and oriented to person, place, and time. She has normal strength and  normal reflexes. No sensory deficit. She displays a negative Romberg sign. GCS eye subscore is 4. GCS verbal subscore is 5. GCS motor subscore is 6.  Reflex Scores:      Tricep reflexes are 2+ on the right side and 2+ on the left side.      Bicep reflexes are 2+ on the right side and 2+ on the left side.      Brachioradialis reflexes are 2+ on the right side and 2+ on the left side.      Patellar reflexes are 2+ on the right side and 2+ on the left side.      Achilles reflexes are 2+ on the right side and 2+ on the left side. Speech is normal without dysarthria, dysphasia, or aphasia. Muscle strength is 5/5 in bilateral shoulders, elbow flexor and extensors, wrist flexor and extensors, and intrinsic hand muscles. 4/5 bilateral lower extremity hip flexors, extensors, knee flexors and extensors, and ankle dorsi and plantar flexors.    Skin: Skin is warm and dry. No rash noted.  Psychiatric: She has a normal mood and affect. Her behavior is normal. Judgment and thought content normal.    ED Course  Procedures (including critical care time) Labs Review Labs Reviewed  URINALYSIS, ROUTINE W REFLEX MICROSCOPIC - Abnormal; Notable for the following:    APPearance CLOUDY (*)    Hgb urine dipstick TRACE (*)    Leukocytes, UA LARGE (*)    All other components within normal limits  URINE MICROSCOPIC-ADD ON - Abnormal; Notable for the following:    Squamous Epithelial / LPF MANY (*)    Bacteria, UA FEW (*)    All other components within normal limits  CBC WITH DIFFERENTIAL  COMPREHENSIVE METABOLIC PANEL  CK    Imaging Review Dg Lumbar Spine Complete  03/03/2014   CLINICAL DATA:  Pain.  No injury.  EXAM: LUMBAR SPINE - COMPLETE 4+ VIEW  COMPARISON:  10/08/2013  FINDINGS: Mild curvature convex to the right. Vertebral body heights are within normal. There is moderate spondylosis. There is moderate disc space narrowing at the L3-4, L4-5 and L5-S1 levels and to a lesser extent at the L1-2 level as  these findings are stable. There is no compression fracture or subluxation. There is moderate facet arthropathy. There is moderate calcified plaque involving the abdominal aorta and iliac vessels. Remainder the exam is unchanged.  IMPRESSION: No acute findings.  Moderate spondylosis with moderate multilevel disc disease   Electronically Signed   By: Marin Olp [name]   On: 03/03/2014 08:39   Mr Lumbar Spine Wo Contrast  03/03/2014   CLINICAL DATA:  New severe bilateral lower leg pain. Chronic low back pain. Urinary retention.  EXAM: MRI LUMBAR SPINE WITHOUT CONTRAST  TECHNIQUE: Multiplanar, multisequence MR imaging of the lumbar spine was performed. No intravenous contrast was administered.  COMPARISON:  Lumbar spine radiographs 03/03/2014.  MRI lumbar spine without contrast 07/15/2012.  FINDINGS: Normal signal is present in the conus medullaris which terminates at L1. Progressive endplate changes and loss of disc height are present at L4-5. Chronic endplate changes are again noted L5-S1. Schmorl's nodes are stable. Vertebral body heights are maintained.  Limited imaging of the abdomen is unremarkable.  L1-2: A shallow central disc protrusion is present without significant stenosis.  L2-3: Mild facet hypertrophy is present bilaterally. There is mild broad-based disc bulging. No significant stenosis is present.  L3-4: A broad-based disc protrusion is present. Moderate facet hypertrophy has progressed. Progressive lateral recess narrowing is worse on the left. Mild foraminal narrowing is stable, slightly worse on the right.  L4-5: Severe central canal stenosis has progressed with a broad-based disc protrusion, advanced facet hypertrophy, and ligamentum flavum thickening. Moderate foraminal stenosis is worse as well, right greater than left.  L5-S1: A broad-based disc protrusion is asymmetric to the right. Moderate facet hypertrophy and ligamentum flavum thickening are again noted. Moderate central canal stenosis is  present with right greater than left lateral recess narrowing. Mild foraminal stenosis bilaterally is stable.  S1-2: A diminutive disc is present. There is no significant protrusion or stenosis.  IMPRESSION: 1. Progressive broad-based disc protrusion and advanced facet hypertrophy at L4-5 with severe central canal stenosis and moderate foraminal narrowing bilaterally, right greater than left. 2. Moderate lateral recess and mild foraminal stenosis at L5-S1 is stable, worse on the right. 3. Progressive lateral recess narrowing at L3-4 is worse on the left. 4. Mild foraminal narrowing at L3-4 is stable, worse on the right. 5. Stable facet hypertrophy and disc bulging at L2-3 without significant stenosis.   Electronically Signed   By: Lawrence Santiago [name]   On: 03/03/2014 11:35     EKG Interpretation None      MDM      Discussed with Dr. Ardeth Perfect.  Plan MRI then if negative will attempt to ambulate patient.  Discussed with Dr. Ronnald Ramp and he saw and evaluated patient.  She will be admitted for surgery for spinal stenosis with pain from this.  Patient with catheterized urine performed revealed 1100 ccs which patient did not note need to urinate from- unclear that she has had symptoms of urinary retention at home.    Shaune Pollack, MD 03/04/14 1003

## 2014-03-03 NOTE — H&P (Signed)
Subjective: Patient is a 80 y.o. female who complains of worsening back and leg pain with numbness distal to the knee. Onset of symptoms was 1 year ago, gradually worsening since that time.  Onset was not related to no known injury. The pain is rated severe, and is located at the across the lower back. The pain is described as aching and occurs intermittently. The symptoms have been progressive, worsening acutely last night prompting a visit to the emergency department. Symptoms are exacerbated by exercise and standing. The patient has tried medications. MRI showed severe spinal stenosis L3-4 L4-5 and L5-S1. She has suffered from decreasing functional abilities and increasing number of falls.   Past Medical History  Diagnosis Date  . Hypertension   . Rheumatoid arteritis   . Phlebitis   . Deep vein thrombophlebitis of leg   . Fractured pelvis     fall 2011  . Thyroid disease     hypothyroidism  . Humerus fracture   . Thrombocytopenia   . Leukocytosis   . Hypothyroidism   . Phlebitis      Recurrent phlebitis  . Hematoma      Right superior and inferior pubic rami fractures with hematoma adjacent to the right ramus fracture  . Urinary tract infection   . Humerus fracture      Right proximal humerus fracture  . Osteoarthritis     for which she takes chronic prednisone  . Macular degeneration   . Chronic anticoagulation   . Fracture of multiple pubic rami 01/16/2013  . Family history of anesthesia complication     SISTER HAS NAUSEA   . Dementia     MILD    Past Surgical History  Procedure Laterality Date  . Left hip open reduction    . Cholecystectomy    . Abdominal hysterectomy  s  . Sinus surgery with instatrak      Allergies  Allergen Reactions  . Morphine And Related     nausea  . Tramadol Other (See Comments)    unknown  . Penicillins Rash    History  Substance Use Topics  . Smoking status: Former Research scientist (life sciences)  . Smokeless tobacco: Never Used  . Alcohol Use: No     Family History  Problem Relation Age of Onset  . Abnormal EKG Neg Hx   . Abnormal newborn screen Neg Hx   . Achalasia Neg Hx   . Achondroplasia Neg Hx   . Acne Neg Hx   . Acromegaly Neg Hx   . Actinic keratosis Neg Hx   . Acute lymphoblastic leukemia Neg Hx   . Addison's disease Neg Hx   . Adrenal disorder Neg Hx   . Albinism Neg Hx   . Alcohol abuse Neg Hx   . Alkaptonuria Neg Hx   . Allergic rhinitis Neg Hx   . Allergies Neg Hx   . Allergy (severe) Neg Hx   . Alopecia Neg Hx   . Alpha-1 antitrypsin deficiency Neg Hx   . Alport syndrome Neg Hx   . ALS Neg Hx   . Alzheimer's disease Neg Hx   . Ambiguous genitalia Neg Hx   . Amblyopia Neg Hx   . Amenorrhea Neg Hx   . Anal fissures Neg Hx   . Anemia Neg Hx   . Anesthesia problems Neg Hx   . Aneurysm Neg Hx   . Angelman syndrome Neg Hx   . Angina Neg Hx   . Angioedema Neg Hx   . Ankylosing spondylitis Neg  Hx   . Anorectal malformation Neg Hx   . Anorexia nervosa Neg Hx   . Anti-cardiolipin syndrome Neg Hx   . Antithrombin III deficiency Neg Hx   . Anuerysm Neg Hx   . Anxiety disorder Neg Hx   . Aortic aneurysm Neg Hx   . Aortic dissection Neg Hx   . Aortic stenosis Neg Hx   . Aphthous stomatitis Neg Hx   . Aplastic anemia Neg Hx   . Appendicitis Neg Hx   . Apraxia Neg Hx   . Arnold-Chiari malformation Neg Hx   . Arrhythmia Neg Hx   . Arthritis Neg Hx   . Asperger's syndrome Neg Hx   . Asthma Neg Hx   . Ataxia Neg Hx   . Ataxia telangiectasia Neg Hx   . Atopy Neg Hx   . Atrial fibrillation Neg Hx   . Atrophic kidney Neg Hx   . Auditory processing disorder Neg Hx   . Autism Neg Hx   . Autism spectrum disorder Neg Hx   . Autoimmune disease Neg Hx   . AVM Neg Hx   . Baker's cyst Neg Hx   . Barrett's esophagus Neg Hx   . Bartter's syndrome Neg Hx   . Basal cell carcinoma Neg Hx   . Behavior problems Neg Hx   . Bell's palsy Neg Hx   . Benign prostatic hyperplasia Neg Hx   . Bipolar disorder Neg Hx   .  Birth defects Neg Hx   . Birth marks Neg Hx   . Blindness Neg Hx   . Bone cancer Neg Hx   . BOR syndrome Neg Hx   . Bow legs Neg Hx   . Bradycardia Neg Hx   . Brain cancer Neg Hx   . BRCA 1/2 Neg Hx   . Breast cancer Neg Hx   . Broken bones Neg Hx   . Bronchiolitis Neg Hx   . Bronchopulmonary dysplasia Neg Hx   . Bruton's disease Neg Hx   . Bulemia Neg Hx   . Bullous pemphigoid Neg Hx   . Bunion Neg Hx   . Bursitis Neg Hx   . Caf-au-lait spots Neg Hx   . Calcium disorder Neg Hx   . Canavan disease Neg Hx   . Cancer Neg Hx   . Cardiomyopathy Neg Hx   . Carpal tunnel syndrome Neg Hx   . Cataracts Neg Hx   . Celiac disease Neg Hx   . Cerebral aneurysm Neg Hx   . Cerebral palsy Neg Hx   . Cervical cancer Neg Hx   . Cervical polyp Neg Hx   . Cervicitis Neg Hx   . Chalasia Neg Hx   . Chalazion Neg Hx   . Charcot-Marie-Tooth disease Neg Hx   . Chediak-Higashi syndrome Neg Hx   . Chiari malformation Neg Hx   . Childhood respiratory disease Neg Hx   . Choanal atresia Neg Hx   . Cholecystitis Neg Hx   . Cholelithiasis Neg Hx   . Cholesteatoma Neg Hx   . Chondromalacia Neg Hx   . Chorea Neg Hx   . Chromosomal disorder Neg Hx   . Chronic bronchitis Neg Hx   . Chronic fatigue Neg Hx   . Chronic granulomatous disease Neg Hx   . Chronic infections Neg Hx   . Cirrhosis Neg Hx   . Cleft lip Neg Hx   . Cleft palate Neg Hx   . Clotting disorder Neg Hx   .  Club foot Neg Hx   . Coarctation of the aorta Neg Hx   . Colitis Neg Hx   . Collagen disease Neg Hx   . Colon cancer Neg Hx   . Colon polyps Neg Hx   . Colonic polyp Neg Hx   . Color blindness Neg Hx   . Conduct disorder Neg Hx   . Conductive hearing loss Neg Hx   . Congenital adrenal hyperplasia Neg Hx   . Congenital heart disease Neg Hx   . Conjunctivitis Neg Hx   . Consanguinity Neg Hx   . Constipation Neg Hx   . COPD Neg Hx   . Corneal abrasion Neg Hx   . Corneal ulcer Neg Hx   . Coronary aneurysm Neg Hx   .  Coronary artery disease Neg Hx   . Cowden syndrome Neg Hx   . Craniosynostosis Neg Hx   . Cri-du-chat syndrome Neg Hx   . Crohn's disease Neg Hx   . Cushing syndrome Neg Hx   . Cystic fibrosis Neg Hx   . Cystic kidney disease Neg Hx   . Cystinosis Neg Hx   . Decreased libido Neg Hx   . Deep vein thrombosis Neg Hx   . Delayed menopause Neg Hx   . Delayed puberty Neg Hx   . Dementia Neg Hx   . Dental caries Neg Hx   . Depression Neg Hx   . Dermatomyositis Neg Hx   . DES usage Neg Hx   . Developmental delay Neg Hx   . Diabetes Neg Hx   . Diabetes insipidus Neg Hx   . Diabetes type I Neg Hx   . Diabetes type II Neg Hx   . Diabetic kidney disease Neg Hx   . DiGeorge syndrome Neg Hx   . Dilated cardiomyopathy Neg Hx   . Dislocations Neg Hx   . Diverticulitis Neg Hx   . Diverticulosis Neg Hx   . Down syndrome Neg Hx   . Drug abuse Neg Hx   . Dupuytren's contracture Neg Hx   . Dwarfism Neg Hx   . Dysfunctional uterine bleeding Neg Hx   . Dysmenorrhea Neg Hx   . Dyspareunia Neg Hx   . Dysphagia Neg Hx   . Dysrhythmia Neg Hx   . Dystonia Neg Hx   . Early death Neg Hx   . Early menopause Neg Hx   . Early puberty Neg Hx   . Eating disorder Neg Hx   . Eclampsia Neg Hx   . Ectodermal dysplasia Neg Hx   . Eczema Neg Hx   . Edema Neg Hx   . Edward's syndrome Neg Hx   . Ehlers-Danlos syndrome Neg Hx   . Emotional abuse Neg Hx   . Emphysema Neg Hx   . Encopresis Neg Hx   . Endocrine tumor Neg Hx   . Endocrinopathy Neg Hx   . Endometrial cancer Neg Hx   . Endometriosis Neg Hx   . Enuresis Neg Hx   . Eosinophilic granuloma Neg Hx   . Epididymitis Neg Hx   . Erectile dysfunction Neg Hx   . Erythema nodosum Neg Hx   . Esophageal cancer Neg Hx   . Esophageal varices Neg Hx   . Esophagitis Neg Hx   . Exostosis Neg Hx   . Fabry's disease Neg Hx   . Factor IX deficiency Neg Hx   . Factor V Leiden deficiency Neg Hx   . Factor VIII deficiency Neg Hx   .  Failure to thrive Neg  Hx   . Fainting Neg Hx   . Familial dysautonomia Neg Hx   . Familial nephritis Neg Hx   . Familial polyposis Neg Hx   . Fanconi anemia Neg Hx   . Febrile seizures Neg Hx   . Fibrocystic breast disease Neg Hx   . Fibroids Neg Hx   . Fibromyalgia Neg Hx   . Food intolerance Neg Hx   . Fragile X syndrome Neg Hx   . Friedreich's ataxia Neg Hx   . Frontotemporal dementia Neg Hx   . Fuch's dystrophy Neg Hx   . Gait disorder Neg Hx   . Galactosemia Neg Hx   . Gallbladder disease Neg Hx   . Gaucher's disease Neg Hx   . Genodermatoses Neg Hx   . GER disease Neg Hx   . Gestational diabetes Neg Hx   . GI problems Neg Hx   . Glaucoma Neg Hx   . Glomerulonephritis Neg Hx   . Glucose-6-phos deficiency Neg Hx   . Glycogen storage disease Neg Hx   . Goiter Neg Hx   . Gonadal disorder Neg Hx   . Gout Neg Hx   . Graves' disease Neg Hx   . Growth hormone deficiency Neg Hx   . GU problems Neg Hx   . Hammer toes Neg Hx   . Hartnup's disease Neg Hx   . Hashimoto's thyroiditis Neg Hx   . Healthy Neg Hx   . Hearing loss Neg Hx   . Heart block Neg Hx   . Heart defect Neg Hx   . Heart disease Neg Hx   . Heart failure Neg Hx   . Heart murmur Neg Hx   . Hemangiomas Neg Hx   . Hematuria Neg Hx   . Hemochromatosis Neg Hx   . Hemolytic uremic syndrome Neg Hx   . Hemophilia Neg Hx   . Henoch-Schonlein purpura Neg Hx   . Hepatitis Neg Hx   . Hepatomegaly Neg Hx   . Hereditary spherocytosis Neg Hx   . Hernia Neg Hx   . Hiatal hernia Neg Hx   . High arches Neg Hx   . Hip dysplasia Neg Hx   . Hip fracture Neg Hx   . Hirschsprung's disease Neg Hx   . Hirsutism Neg Hx   . Histiocytosis X Neg Hx   . HIV Neg Hx   . HLA-B27 positive Neg Hx   . Hodgkin's lymphoma Neg Hx   . Homocystinuria Neg Hx   . Hunter's disease Neg Hx   . Huntington's disease Neg Hx   . Hydrocele Neg Hx   . Hydrocephalus Neg Hx   . Hygroma Neg Hx   . Hypercalcemia Neg Hx   . Hypereosinophilic syndrome Neg Hx   .  Hyperinsulinemia Neg Hx   . Hyperkalemia Neg Hx   . Hyperlipidemia Neg Hx   . Hypermobility Neg Hx   . Hypernasality Neg Hx   . Hyperopia Neg Hx   . Hyperparathyroidism Neg Hx   . Hypersomnolence Neg Hx   . Hypertension Neg Hx   . Hyperthyroidism Neg Hx   . Hypertrophic cardiomyopathy Neg Hx   . Hypokalemia Neg Hx   . Hypoparathyroidism Neg Hx   . Hypoplastic kidney Neg Hx   . Hypotension Neg Hx   . Hypothyroidism Neg Hx   . Idiopathic pulmonary fibrosis Neg Hx   . Idiopathic torsion dystonia Neg Hx   . IgA nephropathy Neg Hx   .  Immunodeficiency Neg Hx   . Imperforate anus Neg Hx   . Impotence Neg Hx   . Impulse control disorder Neg Hx   . Incompetent cervix Neg Hx   . Infertility Neg Hx   . Inflammatory bowel disease Neg Hx   . Inguinal hernia Neg Hx   . Insomnia Neg Hx   . Insulin resistance Neg Hx   . Interstitial cystitis Neg Hx   . Intestinal malrotation Neg Hx   . Intestinal polyp Neg Hx   . Intracerebral hemorrhage Neg Hx   . Iron deficiency Neg Hx   . Irregular heart beat Neg Hx   . Irritable bowel syndrome Neg Hx   . Jaundice Neg Hx   . Job's syndrome Neg Hx   . Joint hypermobility Neg Hx   . Juvenile idiopathic arthritis Neg Hx   . Kartagener's syndrome Neg Hx   . Kawasaki disease Neg Hx   . Keloids Neg Hx   . Kennedy's disease Neg Hx   . Keratoconus Neg Hx   . Kidney cancer Neg Hx   . Kidney disease Neg Hx   . Kidney failure Neg Hx   . Kidney nephrosis Neg Hx   . Klinefelter's syndrome Neg Hx   . Krabbe disease Neg Hx   . Kyphosis Neg Hx   . Labyrinthitis Neg Hx   . Lactose intolerance Neg Hx   . Language disorder Neg Hx   . Lead poisoning Neg Hx   . Learning disabilities Neg Hx   . Legg-Calve-Perthes disease Neg Hx   . Lesch-Nyhan syndrome Neg Hx   . Leukemia Neg Hx   . Lichen planus Neg Hx   . Li-Fraumeni syndrome Neg Hx   . Liver cancer Neg Hx   . Liver disease Neg Hx   . Long QT syndrome Neg Hx   . Lumbar disc disease Neg Hx   . Lung  cancer Neg Hx   . Lung disease Neg Hx   . Lupus Neg Hx   . Lymphoma Neg Hx   . Macrocephaly Neg Hx   . Macrosomia Neg Hx   . Macular degeneration Neg Hx   . Malignant hypertension Neg Hx   . Malignant hyperthermia Neg Hx   . Maple syrup urine disease Neg Hx   . Marfan syndrome Neg Hx   . Mastocytosis Neg Hx   . Melanoma Neg Hx   . Memory loss Neg Hx   . Meniere's disease Neg Hx   . Menstrual problems Neg Hx   . Mental illness Neg Hx   . Mental retardation Neg Hx   . Metabolic syndrome Neg Hx   . Microcephaly Neg Hx   . Migraines Neg Hx   . Milk intolerance Neg Hx   . Miscarriages / Stillbirths Neg Hx   . Mitochondrial disorder Neg Hx   . Mitral valve prolapse Neg Hx   . Motor neuron disease Neg Hx   . Movement disorder Neg Hx   . Moyamoya disease Neg Hx   . Multiple births Neg Hx   . Multiple endocrine neoplasia Neg Hx   . Multiple fractures Neg Hx   . Multiple myeloma Neg Hx   . Multiple sclerosis Neg Hx   . Muscle cancer Neg Hx   . Muscular dystrophy Neg Hx   . Myasthenia gravis Neg Hx   . Myelodysplastic syndrome Neg Hx   . Myocarditis Neg Hx   . Myoclonus Neg Hx   . Myopathy Neg Hx   .  Nail disease Neg Hx   . Narcolepsy Neg Hx   . Nephritis Neg Hx   . Nephrolithiasis Neg Hx   . Nephrotic syndrome Neg Hx   . Neural tube defect Neg Hx   . Neurodegenerative disease Neg Hx   . Neurofibromatosis Neg Hx   . Neuromuscular disorder Neg Hx   . Neuropathy Neg Hx   . Neutropenia Neg Hx   . Nevi Neg Hx   . Niemann-Pick disease Neg Hx   . Night blindness Neg Hx   . Nocturnal enuresis Neg Hx   . Nystagmus Neg Hx   . Obesity Neg Hx   . OCD Neg Hx   . ODD Neg Hx   . Orchitis Neg Hx   . Osler-Weber-Rendu syndrome Neg Hx   . Osteoarthritis Neg Hx   . Osteochondroma Neg Hx   . Osteogenesis imperfecta Neg Hx   . Osteopenia Neg Hx   . Osteoporosis Neg Hx   . Osteosarcoma Neg Hx   . Osteosclerosis Neg Hx   . Other Neg Hx   . Otitis media Neg Hx   . Ovarian cancer  Neg Hx   . Ovarian cysts Neg Hx   . Oxalosis Neg Hx   . Paget's disease of bone Neg Hx   . Pancreatic cancer Neg Hx   . Pancreatitis Neg Hx   . Panhypopituitarism Neg Hx   . Panic disorder Neg Hx   . Paranoid behavior Neg Hx   . Parasomnia Neg Hx   . Parkinsonism Neg Hx   . Patau's syndrome Neg Hx   . Pathological gambling Neg Hx   . PDD Neg Hx   . Pectus carinatum Neg Hx   . Pectus excavatum Neg Hx   . Pelvic inflammatory disease Neg Hx   . Pemphigus vulgaris Neg Hx   . Penile cancer Neg Hx   . Periodic limb movement Neg Hx   . Peripheral vascular disease Neg Hx   . Pernicious anemia Neg Hx   . Personality disorder Neg Hx   . Pes cavus Neg Hx   . Physical abuse Neg Hx   . Polo Riley sequence Neg Hx   . PKU Neg Hx   . Plantar fasciitis Neg Hx   . Pleurisy Neg Hx   . Pneumonia Neg Hx   . Polychondritis Neg Hx   . Polycystic kidney disease Neg Hx   . Polycystic ovary syndrome Neg Hx   . Polycythemia Neg Hx   . Polymyalgia rheumatica Neg Hx   . Polymyositis Neg Hx   . Pompe disease Neg Hx   . Positive PPD/TB Exposure Neg Hx   . Posterior urethral valves Neg Hx   . Post-traumatic stress disorder Neg Hx   . Prader-Willi syndrome Neg Hx   . Premature birth Neg Hx   . Premature ovarian failure Neg Hx   . Preterm labor Neg Hx   . Prolactinoma Neg Hx   . Prostate cancer Neg Hx   . Prostatitis Neg Hx   . Protein C deficiency Neg Hx   . Protein S deficiency Neg Hx   . Proteinuria Neg Hx   . Prune belly syndrome Neg Hx   . Pruritis Neg Hx   . Pseudochol deficiency Neg Hx   . Pseudotumor cerebri Neg Hx   . Psoriasis Neg Hx   . Psychosis Neg Hx   . Ptosis Neg Hx   . Pulmonary embolism Neg Hx   . Pulmonary fibrosis Neg Hx   .  Pyelonephritis Neg Hx   . Pyloric stenosis Neg Hx   . Pyruvate dehydrogenase deficiency Neg Hx   . Rashes / Skin problems Neg Hx   . Raynaud syndrome Neg Hx   . Rectal cancer Neg Hx   . Recurrent abdominal pain Neg Hx   . Reiter's syndrome  Neg Hx   . Renal tubular acidosis Neg Hx   . Restless legs syndrome Neg Hx   . Retinal degeneration Neg Hx   . Retinal detachment Neg Hx   . Retinitis pigmentosa Neg Hx   . Retinoblastoma Neg Hx   . Retinopathy of prematurity Neg Hx   . Reye's syndrome Neg Hx   . Rheum arthritis Neg Hx   . Rheumatic fever Neg Hx   . Rheumatologic disease Neg Hx   . Rickets Neg Hx   . Rosacea Neg Hx   . Sacroiliitis Neg Hx   . Sarcoidosis Neg Hx   . Schizophrenia Neg Hx   . Scleritis Neg Hx   . Scleroderma Neg Hx   . Scoliosis Neg Hx   . Seasonal affective disorder Neg Hx   . Seizures Neg Hx   . Selective mutism Neg Hx   . Sensorineural hearing loss Neg Hx   . Severe combined immunodeficiency Neg Hx   . Severe sprains Neg Hx   . Sexual abuse Neg Hx   . Short stature Neg Hx   . Sick sinus syndrome Neg Hx   . Sickle cell anemia Neg Hx   . Sickle cell trait Neg Hx   . SIDS Neg Hx   . Single kidney Neg Hx   . Sinusitis Neg Hx   . Sjogren's syndrome Neg Hx   . Skeletal dysplasia Neg Hx   . Skin cancer Neg Hx   . Skin telangiectasia Neg Hx   . Sleep apnea Neg Hx   . Sleep disorder Neg Hx   . Sleep walking Neg Hx   . Snoring Neg Hx   . Social phobia Neg Hx   . Speech disorder Neg Hx   . Spina bifida Neg Hx   . Spinal muscular atrophy Neg Hx   . Splenomegaly Neg Hx   . Spondyloarthropathy Neg Hx   . Spondylolisthesis Neg Hx   . Spondylolysis Neg Hx   . Squamous cell carcinoma Neg Hx   . Stevens-Johnson syndrome Neg Hx   . Stickler syndrome Neg Hx   . Stomach cancer Neg Hx   . Strabismus Neg Hx   . Stroke Neg Hx   . Stuttering Neg Hx   . Subarachnoid hemorrhage Neg Hx   . Sudden death Neg Hx   . Suicidality Neg Hx   . Supraventricular tachycardia Neg Hx   . Swallowing difficulties Neg Hx   . Tall stature Neg Hx   . Tay-Sachs disease Neg Hx   . Testicular cancer Neg Hx   . Thalassemia Neg Hx   . Throat cancer Neg Hx   . Thrombocytopenia Neg Hx   . Thrombophilia Neg Hx   .  Thrombophlebitis Neg Hx   . Thrombosis Neg Hx   . Thymic aplasia Neg Hx   . Thyroid cancer Neg Hx   . Thyroid disease Neg Hx   . Thyroid nodules Neg Hx   . Tics Neg Hx   . Tongue cancer Neg Hx   . Torticollis Neg Hx   . Tourette syndrome Neg Hx   . Tracheal cancer Neg Hx   . Tracheoesophageal fistual Neg  Hx   . Tracheomalacia Neg Hx   . Transient ischemic attack Neg Hx   . Treacher Collins syndrome Neg Hx   . Tremor Neg Hx   . Tuberculosis Neg Hx   . Tuberous sclerosis Neg Hx   . Turner syndrome Neg Hx   . Ulcerative colitis Neg Hx   . Ulcers Neg Hx   . Undescended testes Neg Hx   . Unexplained death Neg Hx   . Urinary tract infection Neg Hx   . Urolithiasis Neg Hx   . Urticaria Neg Hx   . Uterine cancer Neg Hx   . Uveitis Neg Hx   . Vaginal cancer Neg Hx   . Valvular heart disease Neg Hx   . Vasculitis Neg Hx   . Velocardiofacial syndrome Neg Hx   . Venous thrombosis Neg Hx   . Verruca vulgaris  Neg Hx   . Vesicoureteral reflux Neg Hx   . Vision loss Neg Hx   . Vitamin D deficiency Neg Hx   . Vitiligo Neg Hx   . Voice disorder Neg Hx   . Von Gierke's disease Neg Hx   . Von Hippel-Lindau syndrome Neg Hx   . Von Willebrand disease Neg Hx   . Werdnig Hoffman Disease Neg Hx   . Williams syndrome Neg Hx   . Wilm's tumor Neg Hx   . Wilson's disease Neg Hx   . Wiskott-Aldrich syndrome Neg Hx   . Yves Dill Parkinson White syndrome Neg Hx   . Xeroderma pigmentosa Neg Hx   . Heart attack Mother   . Heart attack Father    Prior to Admission medications   Medication Sig Start Date End Date Taking? Authorizing Provider  alendronate (FOSAMAX) 70 MG tablet Take 70 mg by mouth once a week. Take on Monday.Take with a full glass of water on an empty stomach.   Yes Historical Provider, MD  aspirin EC 81 MG tablet Take 81 mg by mouth daily.   Yes Historical Provider, MD  bisoprolol-hydrochlorothiazide (ZIAC) 5-6.25 MG per tablet Take 1 tablet by mouth daily.   Yes Historical  Provider, MD  diazepam (VALIUM) 5 MG tablet Take 1 tablet (5 mg total) by mouth at bedtime. 01/18/13  Yes Otelia Limes Carnaghi, PA-C  diltiazem (CARDIZEM CD) 240 MG 24 hr capsule Take 240 mg by mouth daily. 11/08/12  Yes Darlin Coco, MD  folic acid (FOLVITE) 1 MG tablet Take 1 mg by mouth daily.   Yes Historical Provider, MD  levothyroxine (SYNTHROID, LEVOTHROID) 50 MCG tablet Take 50-100 mcg by mouth See admin instructions. Takes 1 tablet daily except on Monday and Friday when she takes 2 tablets 11/12/11  Yes Darlin Coco, MD  predniSONE (DELTASONE) 5 MG tablet Take 5 mg by mouth daily.   Yes Historical Provider, MD     Review of Systems  Positive ROS: Poor vision from macular degeneration  All other systems have been reviewed and were otherwise negative with the exception of those mentioned in the HPI and as above.  Objective: Vital signs in last 24 hours: Temp:  [97.8 F (36.6 C)-98.1 F (36.7 C)] 98.1 F (36.7 C) (06/07 0759) Pulse Rate:  [61-85] 85 (06/07 1430) Resp:  [18-20] 18 (06/07 1421) BP: (125-157)/(57-85) 155/68 mmHg (06/07 1430) SpO2:  [92 %-100 %] 99 % (06/07 1430)  General Appearance: Alert, cooperative, no distress, appears stated age Head: Normocephalic, without obvious abnormality, atraumatic Eyes: PERRL, conjunctiva/corneas clear, EOM's intact, decreased central vision    Neck: Supple, symmetrical,  trachea midline Lungs:respirations unlabored Heart: Regular rate and rhythm Abdomen: Soft Extremities: Extremities normal, atraumatic, no cyanosis or edema   NEUROLOGIC:   Mental status: alert and oriented, no aphasia, good attention span, Fund of knowledge/ memory ok Motor Exam - grossly normal with some weakness in the distal lower extremities Sensory Exam - decreased in distal lower extremity Reflexes: Decreased Coordination - grossly norma in the arms Gait - slow with a walker Balance - poor Cranial Nerves: I: smell Not tested  II: visual acuity   OS: na    OD: na  II: visual fields Full to confrontation  II: pupils Equal, round, reactive to light  III,VII: ptosis None  III,IV,VI: extraocular muscles  Full ROM  V: mastication Normal  V: facial light touch sensation  Normal  V,VII: corneal reflex  Present  VII: facial muscle function - upper  Normal  VII: facial muscle function - lower Normal  VIII: hearing Not tested  IX: soft palate elevation  Normal  IX,X: gag reflex Present  XI: trapezius strength  5/5  XI: sternocleidomastoid strength 5/5  XI: neck flexion strength  5/5  XII: tongue strength  Normal    Data Review Lab Results  Component Value Date   WBC 6.3 03/03/2014   HGB 14.3 03/03/2014   HCT 42.8 03/03/2014   MCV 108.4* 03/03/2014   PLT 118* 03/03/2014   Lab Results  Component Value Date   NA 147 03/03/2014   K 3.8 03/03/2014   CL 107 03/03/2014   CO2 29 03/03/2014   BUN 14 03/03/2014   CREATININE 0.77 03/03/2014   GLUCOSE 86 03/03/2014   Lab Results  Component Value Date   INR 1.06 01/16/2013    Assessment/Plan: Severe lumbar spinal stenosis L3-4 L4-5 and L5-S1 with critical stenosis at L4-5 and degenerative disease L4-5 and L5-S1 with progressive back and leg pain with functional weakness and recent frequent falls. Did have over a liter of urine on catheterization in the ER has not had symptoms of urinary retention or incontinence. This may simply be from inability to get to the bathroom because of acute exacerbation of pain last night. I am going to admit her to the hospital and start physical therapy for strengthening and ambulation. I believe she needs surgical decompression for severe spinal stenosis. I think this will improve her function and decrease her pain and potentially reduce the incidence of her falls. This will help to potentially keep her independent. I have described this to her in detail. I am recommending decompressive laminectomy at L3-4 L4-5 and L5-S1 for severe spinal stenosis. We will do this hopefully  midweek. This admission will allow [place] to monitor her bladder function and her strength and ambulation and start physical therapy prior to surgical decompression.   Eustace Moore 03/03/2014 2:51 PM

## 2014-03-03 NOTE — ED Notes (Signed)
Patient presents to ED via [facility]. Patient states that she has a history of "arthritis" but in the last couple of days the pain has been "unbearable." C/o of lower back pain that extends down bilateral legs and into her feet.Pt normally walks with a walker but unable to do so tonight.  +PMS. A&Ox4. No acute distress noted at this time.

## 2014-03-04 LAB — PROTIME-INR
INR: 1.02 (ref 0.00–1.49)
PROTHROMBIN TIME: 13.2 s (ref 11.6–15.2)

## 2014-03-04 NOTE — Progress Notes (Signed)
CARE MANAGEMENT NOTE 03/04/2014  Patient:  Marissa Parsons, Marissa Parsons   Account Number:  [id number]  Date Initiated:  03/04/2014  Documentation initiated by:  Jiles Crocker  Subjective/Objective Assessment:   ADMITTED WITH SEVERE SPINAL STENOSIS; FOR SURGERY     Action/Plan:   CM FOLLOWING FOR DCP   Anticipated DC Date:  03/13/2014   Anticipated DC Plan:  [facility]ED [facility] F[facility]     DC Planning Services  CM consult          Status of service:  In process, will continue to follow Medicare Important Message given?   (If response is "NO", the following Medicare IM given date fields will be blank)  Per UR Regulation:  Reviewed for med. necessity/level of care/duration of stay  Comments:  6/8/2015Abelino Derrick RN,BSN,MHA [phone]

## 2014-03-04 NOTE — Evaluation (Signed)
Physical Therapy Evaluation Patient Details Name: Marissa Parsons MRN: [id number] DOB: [birth year] Today's Date: 03/04/2014   History of Present Illness  Patient is a 80 y.o. female who complains of worsening back and leg pain with numbness distal to the knee. Onset of symptoms was 1 year ago, gradually worsening since that time.  Onset was not related to no known injury. The pain is rated severe, and is located at the across the lower back. The pain is described as aching and occurs intermittently. The symptoms have been progressive, worsening acutely last night prompting a visit to the emergency department. Symptoms are exacerbated by exercise and standing. The patient has tried medications. MRI showed severe spinal stenosis L3-4 L4-5 and L5-S1. She has suffered from decreasing functional abilities and increasing number of falls.  Pt planned for OR mid week per Dr. Yetta Barre note.  Clinical Impression  Pt mobility greatly limited by bilat LE pain and numbness. Aware pt scheduled for surgery sometime this week. Acute PT to re-assess pt s/p surgery from mobility and d/c recommendation stand points.    Follow Up Recommendations SNF;Supervision/Assistance - 24 hour    Equipment Recommendations       Recommendations for Other Services       Precautions / Restrictions Precautions Precautions: Fall Precaution Comments: pt at high risk of fall Restrictions Weight Bearing Restrictions: No      Mobility  Bed Mobility Overal bed mobility: Needs Assistance Bed Mobility: Supine to Sit     Supine to sit: Min assist     General bed mobility comments: assist for trunk elevation  Transfers Overall transfer level: Needs assistance Equipment used: 1 person hand held assist Transfers: Sit to/from [employer] Sit to Stand: Min assist Stand pivot transfers: Min assist       General transfer comment: pt assisted to [facility], pt with bilat LE pain, pt required increased time to  sequence steps to [facility] due to pain.   Ambulation/Gait Ambulation/Gait assistance: Min assist Ambulation Distance (Feet): 5 Feet Assistive device: Rolling walker (2 wheeled) Gait Pattern/deviations: Step-through pattern;Decreased stride length;Shuffle;Trunk flexed Gait velocity: slow Gait velocity interpretation: Below normal speed for age/gender General Gait Details: Pt limited by bilat LE pain.  Stairs            Wheelchair Mobility    Modified Rankin (Stroke Patients Only)       Balance Overall balance assessment: Needs assistance Sitting-balance support: Feet supported;No upper extremity supported Sitting balance-Leahy Scale: Fair     Standing balance support: Bilateral upper extremity supported Standing balance-Leahy Scale: Poor Standing balance comment: pt requires UE support due to pain in bilat LE                             Pertinent Vitals/Pain bilat LE pain, pt did not rate however had to sit down after 5 feet of amb    Home Living Family/patient expects to be discharged to:: Private residence Living Arrangements: Alone;Other relatives (nephew lives with patient but he works) Available Help at Discharge: Family;Available PRN/intermittently Type of Home: House Home Access: Stairs to enter Entrance Stairs-Rails: Can reach both Entrance Stairs-Number of Steps: 2 Home Layout: One level Home Equipment: Walker - 2 wheels;Shower seat Additional Comments: pt does not drive due to impaired vision    Prior Function Level of Independence: Independent with assistive device(s)         Comments: pt was using RW recently due to bilat LE  pain. Pt reports washing dishes, cleaning bathroom, and doing laundry     Hand Dominance   Dominant Hand: Right    Extremity/Trunk Assessment   Upper Extremity Assessment: Generalized weakness           Lower Extremity Assessment: Generalized weakness (reports of bilat LE pain)      Cervical / Trunk  Assessment: Kyphotic  Communication   Communication:  (has macular degeneration)  Cognition Arousal/Alertness: Awake/alert Behavior During Therapy: WFL for tasks assessed/performed Overall Cognitive Status: Within Functional Limits for tasks assessed (however appears unrealistic regarding surgery reporting that she will be able to go home the next day and be by herself)                      General Comments General comments (skin integrity, edema, etc.): pt performed hygiene from sitting position s/p urination on [facility]    Exercises        Assessment/Plan    PT Assessment Patient needs continued PT services  PT Diagnosis Difficulty walking;Acute pain;Generalized weakness   PT Problem List Decreased strength;Decreased activity tolerance;Pain;Decreased balance;Decreased mobility  PT Treatment Interventions DME instruction;Gait training;Stair training;Functional mobility training;Therapeutic activities;Therapeutic exercise;Balance training   PT Goals (Current goals can be found in the Care Plan section) Acute Rehab PT Goals Patient Stated Goal: to have surgery PT Goal Formulation: With patient Time For Goal Achievement: 03/18/14 Potential to Achieve Goals: Good    Frequency Min 3X/week   Barriers to discharge Decreased caregiver support      Co-evaluation               End of Session Equipment Utilized During Treatment: Gait belt Activity Tolerance: Patient limited by pain Patient left: in chair;with call bell/phone within reach;with chair alarm set Nurse Communication: Mobility status         Time: 8413-2440 PT Time Calculation (min): 15 min   Charges:   PT Evaluation $Initial PT Evaluation Tier I: 1 Procedure PT Treatments $Gait Training: 8-22 mins   PT G CodesRozell Searing Sena Clouatre 03/04/2014, 1:54 PM  Lewis Shock, PT, DPT Pager #: [phone] Office #: [phone]

## 2014-03-04 NOTE — Progress Notes (Signed)
Patient ID: Marissa Parsons, female   DOB: [birth year], 80 y.o.   MRN: [id number] Subjective: Patient reports she's stable. Leg pain with walking. NO new N/T/W  Objective: Vital signs in last 24 hours: Temp:  [97.8 F (36.6 C)-98 F (36.7 C)] 97.9 F (36.6 C) (06/08 1516) Pulse Rate:  [57-81] 57 (06/08 1516) Resp:  [16-20] 18 (06/08 1516) BP: (104-147)/(47-65) 107/49 mmHg (06/08 1516) SpO2:  [93 %-98 %] 97 % (06/08 1516) Weight:  [59.648 kg (131 lb 8 oz)] 59.648 kg (131 lb 8 oz) (06/07 1609)  Intake/Output from previous day: 06/07 0701 - 06/08 0700 In: -  Out: 1875 [Urine:1875] Intake/Output this shift: Total I/O In: 120 [P.O.:120] Out: 450 [Urine:450]  Neurologic: Grossly normal  Lab Results: Lab Results  Component Value Date   WBC 6.3 03/03/2014   HGB 14.3 03/03/2014   HCT 42.8 03/03/2014   MCV 108.4* 03/03/2014   PLT 118* 03/03/2014   Lab Results  Component Value Date   INR 1.02 03/04/2014   BMET Lab Results  Component Value Date   NA 147 03/03/2014   K 3.8 03/03/2014   CL 107 03/03/2014   CO2 29 03/03/2014   GLUCOSE 86 03/03/2014   BUN 14 03/03/2014   CREATININE 0.77 03/03/2014   CALCIUM 10.6* 03/03/2014    Studies/Results: Dg Lumbar Spine Complete  03/03/2014   CLINICAL DATA:  Pain.  No injury.  EXAM: LUMBAR SPINE - COMPLETE 4+ VIEW  COMPARISON:  10/08/2013  FINDINGS: Mild curvature convex to the right. Vertebral body heights are within normal. There is moderate spondylosis. There is moderate disc space narrowing at the L3-4, L4-5 and L5-S1 levels and to a lesser extent at the L1-2 level as these findings are stable. There is no compression fracture or subluxation. There is moderate facet arthropathy. There is moderate calcified plaque involving the abdominal aorta and iliac vessels. Remainder the exam is unchanged.  IMPRESSION: No acute findings.  Moderate spondylosis with moderate multilevel disc disease   Electronically Signed   By: Elberta Fortis [name]   On: 03/03/2014 08:39   Mr  Lumbar Spine Wo Contrast  03/03/2014   CLINICAL DATA:  New severe bilateral lower leg pain. Chronic low back pain. Urinary retention.  EXAM: MRI LUMBAR SPINE WITHOUT CONTRAST  TECHNIQUE: Multiplanar, multisequence MR imaging of the lumbar spine was performed. No intravenous contrast was administered.  COMPARISON:  Lumbar spine radiographs 03/03/2014. MRI lumbar spine without contrast 07/15/2012.  FINDINGS: Normal signal is present in the conus medullaris which terminates at L1. Progressive endplate changes and loss of disc height are present at L4-5. Chronic endplate changes are again noted L5-S1. Schmorl's nodes are stable. Vertebral body heights are maintained.  Limited imaging of the abdomen is unremarkable.  L1-2: A shallow central disc protrusion is present without significant stenosis.  L2-3: Mild facet hypertrophy is present bilaterally. There is mild broad-based disc bulging. No significant stenosis is present.  L3-4: A broad-based disc protrusion is present. Moderate facet hypertrophy has progressed. Progressive lateral recess narrowing is worse on the left. Mild foraminal narrowing is stable, slightly worse on the right.  L4-5: Severe central canal stenosis has progressed with a broad-based disc protrusion, advanced facet hypertrophy, and ligamentum flavum thickening. Moderate foraminal stenosis is worse as well, right greater than left.  L5-S1: A broad-based disc protrusion is asymmetric to the right. Moderate facet hypertrophy and ligamentum flavum thickening are again noted. Moderate central canal stenosis is present with right greater than left lateral recess narrowing. Mild  foraminal stenosis bilaterally is stable.  S1-2: A diminutive disc is present. There is no significant protrusion or stenosis.  IMPRESSION: 1. Progressive broad-based disc protrusion and advanced facet hypertrophy at L4-5 with severe central canal stenosis and moderate foraminal narrowing bilaterally, right greater than left. 2.  Moderate lateral recess and mild foraminal stenosis at L5-S1 is stable, worse on the right. 3. Progressive lateral recess narrowing at L3-4 is worse on the left. 4. Mild foraminal narrowing at L3-4 is stable, worse on the right. 5. Stable facet hypertrophy and disc bulging at L2-3 without significant stenosis.   Electronically Signed   By: Gennette Pac [name]   On: 03/03/2014 11:35    Assessment/Plan: OR wed for severe spinal stenosis   LOS: 1 day    Tia Alert 03/04/2014, 4:04 PM

## 2014-03-04 NOTE — Progress Notes (Signed)
OT Cancellation Note  Patient Details Name: Marissa Parsons MRN: [id number] DOB: 1929/07/10   Cancelled Treatment:    Reason Eval/Treat Not Completed: Other (comment). Per nurse, pt confused/frustrated. Pt plans to have back surgery mid-week. Will assess pt after procedure.   Earlie Raveling OTR/L [phone] 03/04/2014, 9:04 AM

## 2014-03-05 NOTE — Progress Notes (Signed)
Patient ID: Marissa Parsons, female   DOB: [birth year], 80 y.o.   MRN: [id number] No change, ready for surgery tomorrow. Understands risks include but not limited to bleeding, infection, CSF leak, Nerve injury, N/T/W, loss of bladder control, lack of relief of sxs, worsening sxs, need for further surgery, and anesthesia risks. Wished to proceed.

## 2014-03-06 ENCOUNTER — Inpatient Hospital Stay (HOSPITAL_COMMUNITY): Payer: Medicare HMO | Admitting: Anesthesiology

## 2014-03-06 ENCOUNTER — Encounter (HOSPITAL_COMMUNITY): Payer: Medicare HMO | Admitting: Anesthesiology

## 2014-03-06 ENCOUNTER — Encounter (HOSPITAL_COMMUNITY): Payer: Self-pay | Admitting: Anesthesiology

## 2014-03-06 ENCOUNTER — Encounter (HOSPITAL_COMMUNITY)
Admission: EM | Disposition: A | Discharge status: Home / Self Care | Payer: Self-pay | Source: Home / Self Care | Attending: Neurological Surgery

## 2014-03-06 ENCOUNTER — Inpatient Hospital Stay (HOSPITAL_COMMUNITY): Payer: Medicare HMO

## 2014-03-06 DIAGNOSIS — Z9889 Other specified postprocedural states: Secondary | ICD-10-CM

## 2014-03-06 HISTORY — PX: LUMBAR LAMINECTOMY/DECOMPRESSION MICRODISCECTOMY: SHX5026

## 2014-03-06 SURGERY — LUMBAR LAMINECTOMY/DECOMPRESSION MICRODISCECTOMY 3 LEVELS
Anesthesia: General | Site: Back

## 2014-03-06 MED ORDER — GLYCOPYRROLATE 0.2 MG/ML IJ SOLN
INTRAMUSCULAR | Status: DC | PRN
  Administered 2014-03-06: 0.2 mg via INTRAVENOUS

## 2014-03-06 MED ORDER — 0.9 % SODIUM CHLORIDE (POUR BTL) OPTIME
TOPICAL | Status: DC | PRN
  Administered 2014-03-06: 1000 mL

## 2014-03-06 MED ORDER — DEXAMETHASONE 4 MG PO TABS
4.0000 mg | ORAL_TABLET | Freq: Four times a day (QID) | ORAL | Status: DC
  Administered 2014-03-06 – 2014-03-07 (×2): 4 mg via ORAL
  Filled 2014-03-06 (×7): qty 1

## 2014-03-06 MED ORDER — ONDANSETRON HCL 4 MG/2ML IJ SOLN: 4.0000 mg | INTRAMUSCULAR | Status: DC | PRN

## 2014-03-06 MED ORDER — SODIUM CHLORIDE 0.9 % IJ SOLN: 3.0000 mL | INTRAMUSCULAR | Status: DC | PRN

## 2014-03-06 MED ORDER — SODIUM CHLORIDE 0.9 % IV SOLN: 250.0000 mL | INTRAVENOUS | Status: DC

## 2014-03-06 MED ORDER — PHENYLEPHRINE HCL 10 MG/ML IJ SOLN
INTRAMUSCULAR | Status: DC | PRN
  Administered 2014-03-06: 40 ug via INTRAVENOUS
  Administered 2014-03-06: 80 ug via INTRAVENOUS
  Administered 2014-03-06: 120 ug via INTRAVENOUS
  Administered 2014-03-06 (×3): 40 ug via INTRAVENOUS

## 2014-03-06 MED ORDER — SODIUM CHLORIDE 0.9 % IJ SOLN
3.0000 mL | Freq: Two times a day (BID) | INTRAMUSCULAR | Status: DC
  Administered 2014-03-06 – 2014-03-09 (×7): 3 mL via INTRAVENOUS

## 2014-03-06 MED ORDER — OXYCODONE HCL 5 MG PO TABS: 5.0000 mg | ORAL_TABLET | Freq: Once | ORAL | Status: AC | PRN

## 2014-03-06 MED ORDER — STERILE WATER FOR INJECTION IJ SOLN
INTRAMUSCULAR | Status: AC
  Filled 2014-03-06: qty 10

## 2014-03-06 MED ORDER — LACTATED RINGERS IV SOLN
INTRAVENOUS | Status: DC | PRN
  Administered 2014-03-06 (×2): via INTRAVENOUS

## 2014-03-06 MED ORDER — ONDANSETRON HCL 4 MG/2ML IJ SOLN
INTRAMUSCULAR | Status: DC | PRN
Start: 2014-03-06 — End: 2014-03-06
  Administered 2014-03-06: 4 mg via INTRAVENOUS

## 2014-03-06 MED ORDER — HYDROCORTISONE NA SUCCINATE PF 100 MG IJ SOLR
INTRAMUSCULAR | Status: AC
  Filled 2014-03-06: qty 2

## 2014-03-06 MED ORDER — VANCOMYCIN HCL 1000 MG IV SOLR
1000.0000 mg | INTRAVENOUS | Status: DC | PRN
  Administered 2014-03-06: 1000 mg via INTRAVENOUS

## 2014-03-06 MED ORDER — ACETAMINOPHEN 650 MG RE SUPP: 650.0000 mg | RECTAL | Status: DC | PRN

## 2014-03-06 MED ORDER — PROPOFOL 10 MG/ML IV BOLUS
INTRAVENOUS | Status: AC
  Filled 2014-03-06: qty 20

## 2014-03-06 MED ORDER — POTASSIUM CHLORIDE IN NACL 20-0.9 MEQ/L-% IV SOLN
INTRAVENOUS | Status: DC
  Filled 2014-03-06 (×4): qty 1000

## 2014-03-06 MED ORDER — DEXAMETHASONE SODIUM PHOSPHATE 4 MG/ML IJ SOLN
4.0000 mg | Freq: Four times a day (QID) | INTRAMUSCULAR | Status: DC
  Filled 2014-03-06 (×4): qty 1

## 2014-03-06 MED ORDER — EPHEDRINE SULFATE 50 MG/ML IJ SOLN
INTRAMUSCULAR | Status: AC
  Filled 2014-03-06: qty 1

## 2014-03-06 MED ORDER — SURGIFOAM 100 EX MISC
CUTANEOUS | Status: DC | PRN
  Administered 2014-03-06: 17:00:00 via TOPICAL

## 2014-03-06 MED ORDER — METHOTREXATE 2.5 MG PO TABS: 10.0000 mg | ORAL_TABLET | ORAL | Status: DC

## 2014-03-06 MED ORDER — BUPIVACAINE HCL (PF) 0.25 % IJ SOLN
INTRAMUSCULAR | Status: DC | PRN
  Administered 2014-03-06: 7 mL

## 2014-03-06 MED ORDER — ROCURONIUM BROMIDE 100 MG/10ML IV SOLN
INTRAVENOUS | Status: DC | PRN
  Administered 2014-03-06: 40 mg via INTRAVENOUS

## 2014-03-06 MED ORDER — SODIUM CHLORIDE 0.9 % IR SOLN
Status: DC | PRN
  Administered 2014-03-06: 17:00:00

## 2014-03-06 MED ORDER — PHENOL 1.4 % MT LIQD: 1.0000 | OROMUCOSAL | Status: DC | PRN

## 2014-03-06 MED ORDER — LIDOCAINE HCL (CARDIAC) 20 MG/ML IV SOLN
INTRAVENOUS | Status: DC | PRN
  Administered 2014-03-06: 50 mg via INTRAVENOUS

## 2014-03-06 MED ORDER — GLYCOPYRROLATE 0.2 MG/ML IJ SOLN
INTRAMUSCULAR | Status: AC
  Filled 2014-03-06: qty 1

## 2014-03-06 MED ORDER — ACETAMINOPHEN 325 MG PO TABS: 650.0000 mg | ORAL_TABLET | ORAL | Status: DC | PRN

## 2014-03-06 MED ORDER — OXYCODONE-ACETAMINOPHEN 5-325 MG PO TABS
1.0000 | ORAL_TABLET | ORAL | Status: DC | PRN
  Administered 2014-03-06 – 2014-03-09 (×3): 1 via ORAL
  Administered 2014-03-09: 2 via ORAL
  Administered 2014-03-10: 1 via ORAL
  Filled 2014-03-06: qty 1
  Filled 2014-03-06: qty 2
  Filled 2014-03-06 (×3): qty 1

## 2014-03-06 MED ORDER — ESMOLOL HCL 10 MG/ML IV SOLN
INTRAVENOUS | Status: DC | PRN
  Administered 2014-03-06: 10 mg via INTRAVENOUS

## 2014-03-06 MED ORDER — NEOSTIGMINE METHYLSULFATE 10 MG/10ML IV SOLN
INTRAVENOUS | Status: DC | PRN
  Administered 2014-03-06: 2 mg via INTRAVENOUS

## 2014-03-06 MED ORDER — VANCOMYCIN HCL IN DEXTROSE 1-5 GM/200ML-% IV SOLN
1000.0000 mg | Freq: Once | INTRAVENOUS | Status: AC
  Administered 2014-03-07: 1000 mg via INTRAVENOUS
  Filled 2014-03-06: qty 200

## 2014-03-06 MED ORDER — METOCLOPRAMIDE HCL 5 MG/ML IJ SOLN: 10.0000 mg | Freq: Once | INTRAMUSCULAR | Status: AC | PRN

## 2014-03-06 MED ORDER — MENTHOL 3 MG MT LOZG: 1.0000 | LOZENGE | OROMUCOSAL | Status: DC | PRN

## 2014-03-06 MED ORDER — HYDROCORTISONE NA SUCCINATE PF 1000 MG IJ SOLR
INTRAMUSCULAR | Status: DC | PRN
  Administered 2014-03-06: 100 mg via INTRAVENOUS

## 2014-03-06 MED ORDER — ARTIFICIAL TEARS OP OINT
TOPICAL_OINTMENT | OPHTHALMIC | Status: DC | PRN
  Administered 2014-03-06: 1 via OPHTHALMIC

## 2014-03-06 MED ORDER — VANCOMYCIN HCL IN DEXTROSE 1-5 GM/200ML-% IV SOLN
INTRAVENOUS | Status: AC
  Filled 2014-03-06: qty 200

## 2014-03-06 MED ORDER — PROPOFOL 10 MG/ML IV BOLUS
INTRAVENOUS | Status: DC | PRN
  Administered 2014-03-06: 120 mg via INTRAVENOUS

## 2014-03-06 MED ORDER — LIDOCAINE HCL (CARDIAC) 20 MG/ML IV SOLN
INTRAVENOUS | Status: AC
  Filled 2014-03-06: qty 5

## 2014-03-06 MED ORDER — FENTANYL CITRATE 0.05 MG/ML IJ SOLN
INTRAMUSCULAR | Status: DC | PRN
  Administered 2014-03-06 (×5): 50 ug via INTRAVENOUS

## 2014-03-06 MED ORDER — ONDANSETRON HCL 4 MG/2ML IJ SOLN
INTRAMUSCULAR | Status: AC
  Filled 2014-03-06: qty 2

## 2014-03-06 MED ORDER — FENTANYL CITRATE 0.05 MG/ML IJ SOLN
INTRAMUSCULAR | Status: AC
  Filled 2014-03-06: qty 5

## 2014-03-06 MED ORDER — HYDROMORPHONE HCL PF 1 MG/ML IJ SOLN: 0.2500 mg | INTRAMUSCULAR | Status: DC | PRN

## 2014-03-06 MED ORDER — OXYCODONE HCL 5 MG/5ML PO SOLN: 5.0000 mg | Freq: Once | ORAL | Status: AC | PRN

## 2014-03-06 MED ORDER — NEOSTIGMINE METHYLSULFATE 10 MG/10ML IV SOLN
INTRAVENOUS | Status: AC
  Filled 2014-03-06: qty 1

## 2014-03-06 SURGICAL SUPPLY — 48 items
BAG DECANTER FOR FLEXI CONT (MISCELLANEOUS) ×3 IMPLANT
BENZOIN TINCTURE PRP APPL 2/3 (GAUZE/BANDAGES/DRESSINGS) ×3 IMPLANT
BLADE 10 SAFETY STRL DISP (BLADE) ×3 IMPLANT
BUR MATCHSTICK NEURO 3.0 LAGG (BURR) ×3 IMPLANT
CANISTER SUCT 3000ML (MISCELLANEOUS) ×3 IMPLANT
CLOSURE WOUND 1/2 X4 (GAUZE/BANDAGES/DRESSINGS) ×1
CONT SPEC 4OZ CLIKSEAL STRL BL (MISCELLANEOUS) ×3 IMPLANT
DRAPE LAPAROTOMY 100X72X124 (DRAPES) ×3 IMPLANT
DRAPE MICROSCOPE ZEISS OPMI (DRAPES) IMPLANT
DRAPE POUCH INSTRU U-SHP 10X18 (DRAPES) ×3 IMPLANT
DRAPE SURG 17X23 STRL (DRAPES) ×3 IMPLANT
DRSG OPSITE 4X5.5 SM (GAUZE/BANDAGES/DRESSINGS) ×3 IMPLANT
DRSG OPSITE POSTOP 4X6 (GAUZE/BANDAGES/DRESSINGS) ×3 IMPLANT
DRSG TELFA 3X8 NADH (GAUZE/BANDAGES/DRESSINGS) ×3 IMPLANT
DURAPREP 26ML APPLICATOR (WOUND CARE) ×3 IMPLANT
ELECT REM PT RETURN 9FT ADLT (ELECTROSURGICAL) ×3
ELECTRODE REM PT RTRN 9FT ADLT (ELECTROSURGICAL) ×1 IMPLANT
GAUZE SPONGE 4X4 16PLY XRAY LF (GAUZE/BANDAGES/DRESSINGS) IMPLANT
GLOVE BIO SURGEON STRL SZ8 (GLOVE) ×3 IMPLANT
GLOVE BIOGEL PI IND STRL 7.5 (GLOVE) ×3 IMPLANT
GLOVE BIOGEL PI INDICATOR 7.5 (GLOVE) ×6
GLOVE ECLIPSE 7.0 STRL STRAW (GLOVE) ×3 IMPLANT
GLOVE ECLIPSE 7.5 STRL STRAW (GLOVE) ×3 IMPLANT
GLOVE SURG SS PI 7.0 STRL IVOR (GLOVE) ×6 IMPLANT
GOWN STRL REUS W/ TWL LRG LVL3 (GOWN DISPOSABLE) IMPLANT
GOWN STRL REUS W/ TWL XL LVL3 (GOWN DISPOSABLE) ×2 IMPLANT
GOWN STRL REUS W/TWL 2XL LVL3 (GOWN DISPOSABLE) IMPLANT
GOWN STRL REUS W/TWL LRG LVL3 (GOWN DISPOSABLE)
GOWN STRL REUS W/TWL XL LVL3 (GOWN DISPOSABLE) ×4
HEMOSTAT POWDER KIT SURGIFOAM (HEMOSTASIS) IMPLANT
KIT BASIN OR (CUSTOM PROCEDURE TRAY) ×3 IMPLANT
KIT ROOM TURNOVER OR (KITS) ×3 IMPLANT
NEEDLE HYPO 25X1 1.5 SAFETY (NEEDLE) ×3 IMPLANT
NEEDLE SPNL 20GX3.5 QUINCKE YW (NEEDLE) IMPLANT
NS IRRIG 1000ML POUR BTL (IV SOLUTION) ×3 IMPLANT
PACK LAMINECTOMY NEURO (CUSTOM PROCEDURE TRAY) ×3 IMPLANT
PAD ARMBOARD 7.5X6 YLW CONV (MISCELLANEOUS) ×9 IMPLANT
RUBBERBAND STERILE (MISCELLANEOUS) IMPLANT
SPONGE SURGIFOAM ABS GEL SZ50 (HEMOSTASIS) ×3 IMPLANT
STRIP CLOSURE SKIN 1/2X4 (GAUZE/BANDAGES/DRESSINGS) ×2 IMPLANT
SUT VIC AB 0 CT1 18XCR BRD8 (SUTURE) ×1 IMPLANT
SUT VIC AB 0 CT1 8-18 (SUTURE) ×2
SUT VIC AB 2-0 CP2 18 (SUTURE) ×3 IMPLANT
SUT VIC AB 3-0 SH 8-18 (SUTURE) ×3 IMPLANT
SYR 20ML ECCENTRIC (SYRINGE) ×3 IMPLANT
TOWEL OR 17X24 6PK STRL BLUE (TOWEL DISPOSABLE) ×3 IMPLANT
TOWEL OR 17X26 10 PK STRL BLUE (TOWEL DISPOSABLE) ×3 IMPLANT
WATER STERILE IRR 1000ML POUR (IV SOLUTION) ×3 IMPLANT

## 2014-03-06 NOTE — Op Note (Signed)
03/03/2014 - 03/06/2014  6:42 PM  PATIENT:  Marissa Parsons  80 y.o. female  PRE-OPERATIVE DIAGNOSIS:  Severe lumbar spinal stenosis with leg numbness and weakness, back and leg pain  POST-OPERATIVE DIAGNOSIS:  Same  PROCEDURE:  Decompressive lumbar laminectomy, medial facetectomy, and foraminotomies L3-4 L4-5 L5-S1  SURGEON:  Marikay Alar, MD  ASSISTANTS: dr Conchita Paris  ANESTHESIA:   General  EBL: 100 ml  Total I/O In: 1000 [I.V.:1000] Out: 100 [Blood:100]  BLOOD ADMINISTERED:none  DRAINS: None   SPECIMEN:  No Specimen  INDICATION FOR PROCEDURE: This patient presented to the hospital progressive weakness in her legs. She had an MRI which showed severe spinal stenosis. She has chronic back pain with bilateral leg pain and numbness. I recommended decompressive laminectomy. Patient understood the risks, benefits, and alternatives and potential outcomes and wished to proceed.  PROCEDURE DETAILS: The patient was taken to the operating room and after induction of adequate generalized endotracheal anesthesia, the patient was rolled into the prone position on the Wilson frame and all pressure points were padded. The lumbar region was cleaned and then prepped with DuraPrep and draped in the usual sterile fashion. 5 cc of local anesthesia was injected and then a dorsal midline incision was made and carried down to the lumbo sacral fascia. The fascia was opened and the paraspinous musculature was taken down in a subperiosteal fashion to expose L3-4, L4-5 and L5-S1 bilaterally. Intraoperative x-ray confirmed my level ( was also read by the radiologist), and then I used a combination of the high-speed drill and the Kerrison punches to perform a hemilaminectomy, medial facetectomy, and foraminotomy at L3-4, L4-5 and L5-S1 bilaterally. The underlying yellow ligament was opened and removed in a piecemeal fashion to expose the underlying dura and exiting nerve root. She had very severe spinal stenosis  at L4-5 with severe overgrowth of the yellow ligament. There were severe compression of the thecal sac. I undercut the lateral recess and dissected down until I was medial to and distal to the pedicle at each level. The nerve root was well decompressed at each level.  I then palpated with a coronary dilator along the nerve root and into the foramen to assure adequate decompression. I felt no more compression of the nerve root. I irrigated with saline solution containing bacitracin. Achieved hemostasis with bipolar cautery, lined the dura with Gelfoam, and then closed the fascia with 0 Vicryl. I closed the subcutaneous tissues with 2-0 Vicryl and the subcuticular tissues with 3-0 Vicryl. The skin was then closed with benzoin and Steri-Strips. The drapes were removed, a sterile dressing was applied. The patient was awakened from general anesthesia and transferred to the recovery room in stable condition. At the end of the procedure all sponge, needle and instrument counts were correct.   PLAN OF CARE: Admit to inpatient   PATIENT DISPOSITION:  PACU - hemodynamically stable.   Delay start of Pharmacological VTE agent (>24hrs) due to surgical blood loss or risk of bleeding:  yes

## 2014-03-06 NOTE — Anesthesia Preprocedure Evaluation (Signed)
Anesthesia Evaluation  Patient identified by MRN, date of birth, ID band Patient awake    Reviewed: Allergy & Precautions, H&P , NPO status , Patient's Chart, lab work & pertinent test results, reviewed documented beta blocker date and time   Airway Mallampati: II TM Distance: >3 FB Neck ROM: full    Dental   Pulmonary neg pulmonary ROS, former smoker,  breath sounds clear to auscultation        Cardiovascular hypertension, On Medications + Peripheral Vascular Disease Rhythm:regular     Neuro/Psych PSYCHIATRIC DISORDERS negative neurological ROS     GI/Hepatic negative GI ROS, Neg liver ROS,   Endo/Other  Hypothyroidism   Renal/GU negative Renal ROS  negative genitourinary   Musculoskeletal   Abdominal   Peds  Hematology negative hematology ROS (+)   Anesthesia Other Findings See surgeon's H&P   Reproductive/Obstetrics negative OB ROS                           Anesthesia Physical Anesthesia Plan  ASA: III  Anesthesia Plan: General   Post-op Pain Management:    Induction: Intravenous  Airway Management Planned: Oral ETT  Additional Equipment:   Intra-op Plan:   Post-operative Plan: Extubation in OR  Informed Consent: I have reviewed the patients History and Physical, chart, labs and discussed the procedure including the risks, benefits and alternatives for the proposed anesthesia with the patient or authorized representative who has indicated his/her understanding and acceptance.   Dental Advisory Given  Plan Discussed with: CRNA and Surgeon  Anesthesia Plan Comments:         Anesthesia Quick Evaluation

## 2014-03-06 NOTE — Anesthesia Postprocedure Evaluation (Signed)
Anesthesia Post Note  Patient: Marissa Parsons  Procedure(s) Performed: Procedure(s) (LRB): LUMBAR LAMINECTOMY/DECOMPRESSION LUMBAR THREE, FOUR, AND FIVE  (N/A)  Anesthesia type: general  Patient location: PACU  Post pain: Pain level controlled  Post assessment: Patient's Cardiovascular Status Stable  Last Vitals:  Filed Vitals:   03/06/14 2241  BP: 109/60  Pulse: 73  Temp: 36.6 C  Resp: 16    Post vital signs: Reviewed and stable  Level of consciousness: sedated  Complications: No apparent anesthesia complications

## 2014-03-06 NOTE — Transfer of Care (Signed)
Immediate Anesthesia Transfer of Care Note  Patient: Marissa Parsons  Procedure(s) Performed: Procedure(s) with comments: LUMBAR LAMINECTOMY/DECOMPRESSION LUMBAR THREE, FOUR, AND FIVE  (N/A) - LUMBAR LAMINECTOMY/DECOMPRESSION LUMBAR THREE, FOUR, AND FIVE   Patient Location: PACU  Anesthesia Type:General  Level of Consciousness: awake, alert  and oriented  Airway & Oxygen Therapy: Patient Spontanous Breathing  Post-op Assessment: Report given to PACU RN and Post -op Vital signs reviewed and stable  Post vital signs: Reviewed and stable  Complications: No apparent anesthesia complications

## 2014-03-06 NOTE — Clinical Social Work Psychosocial (Signed)
Clinical Social Work Department BRIEF PSYCHOSOCIAL ASSESSMENT 03/06/2014  Patient:  NIKOLETA, DADY     Account Number:  [id number]     Admit date:  03/03/2014  Clinical Social Worker:  Robin Searing  Date/Time:  03/06/2014 12:11 PM  Referred by:  Physician  Date Referred:  03/06/2014 Referred for  SNF Placement   Other Referral:   Interview type:  Patient Other interview type:    PSYCHOSOCIAL DATA Living Status:  FAMILY Admitted from facility:   Level of care:   Primary support name:  nephew- brother and a sister Primary support relationship to patient:  FAMILY Degree of support available:   good but minimal physical assistance    CURRENT CONCERNS Current Concerns  Post-Acute Placement   Other Concerns:    SOCIAL WORK ASSESSMENT / PLAN Patient reports to CSW that she is going to surgery later today- "it's gonna be a long day"- surgery time is 4pm. Patient in good spirits and looking forward to having the surgery completed so she can regain her strength, independence and lessen the pain. Patient shared with CSW that she has been to SNF several times in the past and she anticipates needing it again- her nephew lives with her but she feels SNF will be beneficial. She would like to return to [facility] where she has been in the past if possible.  Patient reports that her brother and sister as well as the nephew who lives with her are good support to her- she reports having a daughter and grandchild who she hasn't seen in 8 years- "God has taken my tears and led me through the grief". She is at peace with this-   Assessment/plan status:  Other - See comment Other assessment/ plan:   FL2 and PASARR for SNF search.   Information/referral to community resources:   SNF list    PATIENT'S/FAMILY'S RESPONSE TO PLAN OF CARE: Patient agrees to pursuing SNF post operatively at dc- CSW will begin workring towards this and advise on offers as rec'd.       Reece Levy,  MSW, [facility]ors [phone]

## 2014-03-06 NOTE — OR Nursing (Signed)
Non-blanching sacral redness. Found when positioning pt prone. Will report to Neuro PACU RN.

## 2014-03-07 ENCOUNTER — Encounter (HOSPITAL_COMMUNITY): Payer: Self-pay | Admitting: Neurological Surgery

## 2014-03-07 NOTE — Progress Notes (Signed)
UR complete.  Kasondra Junod RN, MSN 

## 2014-03-07 NOTE — Progress Notes (Signed)
Patient ID: Marissa Parsons, female   DOB: 1929-05-10, 80 y.o.   MRN: [id number] Subjective: Patient reports minor back soreness, reduced leg numbness, no leg pain. Has walked to the BR  Objective: Vital signs in last 24 hours: Temp:  [97.4 F (36.3 C)-98.3 F (36.8 C)] 97.7 F (36.5 C) (06/11 0608) Pulse Rate:  [72-100] 99 (06/11 0608) Resp:  [16-23] 16 (06/11 0608) BP: (109-151)/(56-75) 130/73 mmHg (06/11 0608) SpO2:  [92 %-99 %] 95 % (06/11 0608)  Intake/Output from previous day: 06/10 0701 - 06/11 0700 In: 1400 [I.V.:1400] Out: 100 [Blood:100] Intake/Output this shift: Total I/O In: 360 [P.O.:360] Out: -   Neurologic: Grossly normal to in chair exam  Lab Results: Lab Results  Component Value Date   WBC 6.3 03/03/2014   HGB 14.3 03/03/2014   HCT 42.8 03/03/2014   MCV 108.4* 03/03/2014   PLT 118* 03/03/2014   Lab Results  Component Value Date   INR 1.02 03/04/2014   BMET Lab Results  Component Value Date   NA 147 03/03/2014   K 3.8 03/03/2014   CL 107 03/03/2014   CO2 29 03/03/2014   GLUCOSE 86 03/03/2014   BUN 14 03/03/2014   CREATININE 0.77 03/03/2014   CALCIUM 10.6* 03/03/2014    Studies/Results: Dg Lumbar Spine 1 View  03/06/2014   CLINICAL DATA:  Spine surgery and surgical localization.  EXAM: LUMBAR SPINE - 1 VIEW  COMPARISON:  MRI of lumbar spine 03/03/2014.  FINDINGS: Single lateral view was obtained. The lumbar spine was labeled similar to the recent MRI of the lumbar spine. There is marked disc space narrowing at L4-L5 and L5-S1. Surgical markers at the level of L4 and L5. Atherosclerotic disease in the aorta.  IMPRESSION: Surgical marking at L4 and L5.   Electronically Signed   By: Richarda Overlie [name]   On: 03/06/2014 17:44    Assessment/Plan: Seems to be doing well. Interested to see how she tolerates PT/OT today.   LOS: 4 days    Latishia Suitt S 03/07/2014, 9:12 AM

## 2014-03-07 NOTE — Progress Notes (Addendum)
Physical Therapy Treatment Patient Details Name: Marissa Parsons MRN: [id number] DOB: 1928-12-22 Today's Date: 03/07/2014    History of Present Illness Pt is an [name] s/p lumbar laminectomy/decompression on 03/06/14. Pt was admitted 03/03/14 due to Bil LE pain and weakness and history of falls over the past few weeks.     PT Comments    Reinforced all advised precaution, continued education.  Pt moving better than pre surgery as hoped.  May need ST SNF due to no assist at home.  Will determine in next day or two.   Follow Up Recommendations  SNF;Other (comment) (up to 24 hour assist.  May decide to go home with HHPT)     Equipment Recommendations  None recommended by PT    Recommendations for Other Services       Precautions / Restrictions Precautions Precautions: Back;Fall Precaution Booklet Issued: Yes (comment) Precaution Comments: educated pt on 3/3 back precautions and incorporating into ADLs. Restrictions Weight Bearing Restrictions: No    Mobility  Bed Mobility Overal bed mobility: Needs Assistance             General bed mobility comments: pt up in recliner.  Addressed log roll and side to sit but not observed.  Transfers Overall transfer level: Needs assistance Equipment used: Rolling walker (2 wheeled) Transfers: Sit to/from Stand Sit to Stand: Min assist         General transfer comment: Good safety, Stability assist  Ambulation/Gait Ambulation/Gait assistance: Min assist Ambulation Distance (Feet): 240 Feet Assistive device: Rolling walker (2 wheeled) Gait Pattern/deviations: Step-through pattern Gait velocity: slow   General Gait Details: generally steady but trunk still weak  and pt unable to keep pelvis in midline and is biased left   Stairs            Wheelchair Mobility    Modified Rankin (Stroke Patients Only)       Balance Overall balance assessment: Needs assistance Sitting-balance support: Feet supported;No  upper extremity supported Sitting balance-Leahy Scale: Good Sitting balance - Comments: Actually able to move out of BOS against her precautions   Standing balance support: No upper extremity supported Standing balance-Leahy Scale: Fair                      Cognition Arousal/Alertness: Awake/alert Behavior During Therapy: WFL for tasks assessed/performed Overall Cognitive Status: Within Functional Limits for tasks assessed       Memory: Decreased recall of precautions              Exercises      General Comments General comments (skin integrity, edema, etc.): Reinforced advised back precautions, log roll, lifting restrictions, and progression of activity      Pertinent Vitals/Pain No pain.    Home Living Family/patient expects to be discharged to:: Private residence Living Arrangements: Other relatives Available Help at Discharge: Family;Available PRN/intermittently;Friend(s) Type of Home: House Home Access: Stairs to enter Entrance Stairs-Rails: Can reach both Home Layout: One level Home Equipment: Walker - 2 wheels;Shower seat Additional Comments: pt does not drive due to impaired vision    Prior Function Level of Independence: Independent with assistive device(s)      Comments: pt was using RW recently due to bilat LE pain. Pt reports washing dishes, cleaning bathroom, and doing laundry   PT Goals (current goals can now be found in the care plan section) Acute Rehab PT Goals Patient Stated Goal: to go home PT Goal Formulation: With patient Time For Goal  Achievement: 03/14/14 Potential to Achieve Goals: Good    Frequency  Min 5X/week    PT Plan      Co-evaluation             End of Session Equipment Utilized During Treatment: Gait belt Activity Tolerance: Patient tolerated treatment well Patient left: in chair;with call bell/phone within reach;with chair alarm set     Time: 1610-9604 PT Time Calculation (min): 30 min  Charges:   $Gait Training: 8-22 mins $Self Care/Home Management: 2023-06-09                    G Codes:      Neel Buffone, [name] 03/07/2014, 4:12 PM 03/07/2014  [place] Bing, PT [phone]  (pager)

## 2014-03-07 NOTE — Progress Notes (Signed)
Occupational Therapy Evaluation Patient Details Name: Marissa Parsons MRN: [id number] DOB: 08/31/1929 Today's Date: 03/07/2014    History of Present Illness Pt is an [name] s/p lumbar laminectomy/decompression on 03/06/14. Pt was admitted 03/03/14 due to Bil LE pain and weakness and history of falls over the past few weeks.    Clinical Impression   PTA pt lived at home with her nephew and was independent with ADLs with the use of a RW. Pt reports recent falls due to Bil LE weakness and pain. Pt reports legs feel significantly better since her surgery. Pt overall required Min A for ADLs and functional mobility. Educated pt on fall prevention and safety with DME, compensatory techniques for LB ADLs and toilet hygiene, and incorporating back precautions into ADLs. Pt would benefit from continued OT to increase independence with ADLs. Discharge planning TBD based on pt's progression with therapy for SNF vs. HHOT.     Follow Up Recommendations  TBD: SNF vs Home health OT;Supervision/Assistance - 24 hour    Equipment Recommendations  3 in 1 bedside comode       Precautions / Restrictions Precautions Precautions: Back;Fall Precaution Booklet Issued: Yes (comment) Precaution Comments: educated pt on 3/3 back precautions and incorporating into ADLs. Restrictions Weight Bearing Restrictions: No      Mobility Bed Mobility               General bed mobility comments: Pt up in recliner before/after OT session.   Transfers Overall transfer level: Needs assistance Equipment used: 1 person hand held assist Transfers: Sit to/from Stand Sit to Stand: Min assist         General transfer comment: Pt required 1 person hand held assist, however will likely do well with RW.          ADL Overall ADL's : Needs assistance/impaired Eating/Feeding: Independent;Sitting   Grooming: Set up;Sitting   Upper Body Bathing: Set up;Sitting   Lower Body Bathing: Minimal assistance;Sit  to/from stand;Adhering to back precautions (pt unable to reach L foot through figure four sitting)   Upper Body Dressing : Set up;Sitting   Lower Body Dressing: Minimal assistance;Sit to/from stand;Adhering to back precautions   Toilet Transfer: Minimal assistance;Ambulation;BSC (1 person HHA)   Toileting- Clothing Manipulation and Hygiene: Minimal assistance;Sit to/from stand;Adhering to back precautions   Tub/ Shower Transfer: Walk-in shower;Minimal assistance;Adhering to back precautions;Ambulation;Shower seat (1 person HHA)   Functional mobility during ADLs: Minimal assistance (1 person HHA) General ADL Comments: Pt is moving well and requires min A for ambulation due to lack of RW in room. Pt reports using RW at home lately due to pain and weakness in Bil LEs. Pt ambulated to bathroom for grooming at the sink and returned to recliner with min A.      Vision  Pt reports hx of macular degeneration.   Pt reports no change from baseline.                 Perception Perception Perception Tested?: No   Praxis Praxis Praxis tested?: Within functional limits    Pertinent Vitals/Pain No c/o pain, NAD.      Hand Dominance Right   Extremity/Trunk Assessment Upper Extremity Assessment Upper Extremity Assessment: Generalized weakness   Lower Extremity Assessment Lower Extremity Assessment: Generalized weakness   Cervical / Trunk Assessment Cervical / Trunk Assessment: Kyphotic   Communication Communication Communication: No difficulties   Cognition Arousal/Alertness: Awake/alert Behavior During Therapy: WFL for tasks assessed/performed Overall Cognitive Status: Within Functional Limits for  tasks assessed       Memory: Decreased recall of precautions                        Home Living Family/patient expects to be discharged to:: Private residence Living Arrangements: Other relatives (Nephew lives with pt) Available Help at Discharge: Family;Available  PRN/intermittently;Friend(s) Type of Home: House Home Access: Stairs to enter [employer] of Steps: 2 Entrance Stairs-Rails: Can reach both Home Layout: One level     Bathroom Shower/Tub: Producer, television/film/video: Handicapped height Bathroom Accessibility: Yes How Accessible: Accessible via walker Home Equipment: Walker - 2 wheels;Shower seat   Additional Comments: pt does not drive due to impaired vision      Prior Functioning/Environment Level of Independence: Independent with assistive device(s)        Comments: pt was using RW recently due to bilat LE pain. Pt reports washing dishes, cleaning bathroom, and doing laundry    OT Diagnosis: Generalized weakness;Acute pain   OT Problem List: Decreased strength;Decreased range of motion;Decreased activity tolerance;Impaired balance (sitting and/or standing);Decreased safety awareness;Decreased knowledge of use of DME or AE;Decreased knowledge of precautions;Pain   OT Treatment/Interventions: Self-care/ADL training;Energy conservation;DME and/or AE instruction;Therapeutic activities;Patient/family education;Balance training    OT Goals(Current goals can be found in the care plan section) Acute Rehab OT Goals Patient Stated Goal: to go home OT Goal Formulation: With patient Time For Goal Achievement: 03/14/14 Potential to Achieve Goals: Good ADL Goals Pt Will Perform Grooming: with modified independence;standing Pt Will Perform Lower Body Bathing: with modified independence;with adaptive equipment;sit to/from stand Pt Will Perform Lower Body Dressing: with modified independence;with adaptive equipment;sit to/from stand Pt Will Transfer to Toilet: with modified independence;ambulating;bedside commode Pt Will Perform Toileting - Clothing Manipulation and hygiene: with modified independence;sit to/from stand Pt Will Perform Tub/Shower Transfer: Shower transfer;with modified independence;ambulating;shower  seat;rolling walker Additional ADL Goal #1: Pt will perform bed mobility using log roll technique with Supervision to prepare for ADLs.  OT Frequency: Min 2X/week   Barriers to D/C: Decreased caregiver support  Pt reports that her nephew lives with her but he is at work during the day. Pt has no 24/7 support. She does report that people will be "in and out" to check on her.          End of Session Equipment Utilized During Treatment: Gait belt  Activity Tolerance: Patient tolerated treatment well Patient left: in chair;with call bell/phone within reach;with chair alarm set   Time: 1201-1232 OT Time Calculation (min): 31 min Charges:  OT General Charges $OT Visit: 1 Procedure OT Evaluation $Initial OT Evaluation Tier I: 1 Procedure OT Treatments $Self Care/Home Management : 8-22 mins  Rae Lips [phone] 03/07/2014, 1:38 PM

## 2014-03-08 NOTE — Plan of Care (Signed)
Problem: Consults Goal: Diagnosis - Spinal Surgery Lumbar Laminectomy (Complex)     

## 2014-03-08 NOTE — Clinical Social Work Note (Signed)
Per RNCM, pt and pt's brother have decided on discharging home with home health services versus SNF. CSW signing off.  Marcelline Deist, MSW, [facility] Licensed Clinical Social Worker [phone] and [phone]

## 2014-03-08 NOTE — Plan of Care (Signed)
Problem: Consults Goal: Diagnosis - Spinal Surgery Outcome: Completed/Met Date Met:  03/08/14 [place]/Lumbar Spine Fusion

## 2014-03-08 NOTE — Progress Notes (Signed)
Physical Therapy Treatment Patient Details Name: Marissa Parsons MRN: [id number] DOB: 02/16/1929 Today's Date: 03/08/2014    History of Present Illness Pt is an [name] s/p lumbar laminectomy/decompression on 03/06/14. Pt was admitted 03/03/14 due to Bil LE pain and weakness and history of falls over the past few weeks.     PT Comments    Pt has made a definite decision to go home  (confirmed by her brother)  Will have HHPT see her for home safety eval and treat as needed.  Follow Up Recommendations  Home health PT;Other (comment) (pt has decided with confirmation by brother to go home.)     Equipment Recommendations  None recommended by PT    Recommendations for Other Services       Precautions / Restrictions Precautions Precautions: Back;Fall Precaution Booklet Issued: Yes (comment) Precaution Comments: educated pt on 3/3 back precautions and incorporating into ADLs. Restrictions Weight Bearing Restrictions: No    Mobility  Bed Mobility Overal bed mobility: Needs Assistance Bed Mobility: Supine to Sit     Supine to sit: Min assist     General bed mobility comments: reinforced log roll and coming up from side  Transfers Overall transfer level: Needs assistance Equipment used: Rolling walker (2 wheeled) Transfers: Sit to/from Stand Sit to Stand: Supervision         General transfer comment: Used safe technique  Ambulation/Gait Ambulation/Gait assistance: Supervision Ambulation Distance (Feet): 300 Feet Assistive device: Rolling walker (2 wheeled) Gait Pattern/deviations: Step-through pattern Gait velocity: slow   General Gait Details: generally steady but trunk still weak  and pt unable to keep pelvis in midline and is biased left   Stairs Stairs: Yes Stairs assistance: Supervision Stair Management: One rail Right;With walker;Forwards;Step to pattern Number of Stairs: 5 General stair comments: Has a specific method that should work safely for  her.  Wheelchair Mobility    Modified Rankin (Stroke Patients Only)       Balance Overall balance assessment: Needs assistance Sitting-balance support: No upper extremity supported Sitting balance-Leahy Scale: Good     Standing balance support: Bilateral upper extremity supported Standing balance-Leahy Scale: Fair                      Cognition Arousal/Alertness: Awake/alert Behavior During Therapy: WFL for tasks assessed/performed Overall Cognitive Status: Within Functional Limits for tasks assessed                      Exercises      General Comments General comments (skin integrity, edema, etc.): Reinforced advised back precautions, log roll, lifting restrictions, and progression of activity      Pertinent Vitals/Pain     Home Living                      Prior Function            PT Goals (current goals can now be found in the care plan section) Acute Rehab PT Goals Patient Stated Goal: to go home PT Goal Formulation: With patient Time For Goal Achievement: 03/14/14 Potential to Achieve Goals: Good Progress towards PT goals: Progressing toward goals    Frequency  Min 5X/week    PT Plan Current plan remains appropriate    Co-evaluation             End of Session Equipment Utilized During Treatment: Gait belt Activity Tolerance: Patient tolerated treatment well Patient left: in chair;with call bell/phone  within reach;with chair alarm set;with family/visitor present     Time: 1610-9604 PT Time Calculation (min): 28 min  Charges:  $Gait Training: 8-22 mins $Self Care/Home Management: 8-22                    G Codes:      Marissa Parsons, Marissa Parsons 03/08/2014, 1:42 PM 03/08/2014  Marissa Parsons, PT [phone]  (pager)

## 2014-03-08 NOTE — Care Management Note (Signed)
    Page 1 of 2   03/08/2014     4:09:08 PM CARE MANAGEMENT NOTE 03/08/2014  Patient:  Marissa Parsons, Marissa Parsons   Account Number:  [id number]  Date Initiated:  03/04/2014  Documentation initiated by:  Olga Coaster  Subjective/Objective Assessment:   ADMITTED WITH SEVERE SPINAL STENOSIS; FOR SURGERY     Action/Plan:   CM FOLLOWING FOR DCP   Anticipated DC Date:  03/13/2014   Anticipated DC Plan:  [place]  CM consult      Choice offered to / List presented to:  C-1 Patient        [place] arranged  [place] agency  [facility]   Status of service:  In process, will continue to follow Medicare Important Message given?  YES (If response is "NO", the following Medicare IM given date fields will be blank) Date Medicare IM given:  03/08/2014 Date Additional Medicare IM given:    Discharge Disposition:    Per UR Regulation:  Reviewed for med. necessity/level of care/duration of stay  If discussed at [place] of Stay Meetings, dates discussed:    Comments:  03/08/14 [place], MSN, CM- Recieved call from [place] stating that they are unable to accept the referral.  Met with patient and brother Marissa Parsons, who have chosen [facility].  Mary with [facility] was notified and has accepted the referral. Please contact brother Marissa Parsons for appointments: home [phone] or cell [phone].   03/08/14 [place] RN, MSN, CM- Met with patient and brother to discuss home health needs.  Patient and brother are in agreement on discharging home instead of SNF.  CSW was notified and will contact the facility regarding change in discharge disposition.  Patient has chosen [facility] for HHPT/OT, which they have used in the past.  Voicemail was left for [facility] with [facility] regarding the referral.  Awaiting call back.  Message left for Dr Ronnald Ramp in [place] to notify of discharge disposition change.  Pt and brother are hoping for  discharge home this afternoon. Awaiting return call from Dr Ronnald Ramp.  Pt denies any DME needs, already has a rolling walker at home.  RN updated.   6/8/2015Mindi Slicker RN,BSN,MHA [phone]

## 2014-03-08 NOTE — Progress Notes (Signed)
Patient ID: Marissa Parsons, female   DOB: 1928-10-04, 80 y.o.   MRN: [id number] Patient seems to be doing well today. Complains of some mild back soreness but no leg pain. States she walked quite a distance yesterday with her walker. Feels more steady on her feet. Denies numbness in the legs. Denies weakness. There may be some mild disorientation or confusion and that at one point she felt her twin sister was lying in the bed. Her twin sister I believe is deceased. Her dressing is dry. Her strength is good. Continue therapy today. Potentially home with home health versus short-term skilled nursing facility.

## 2014-03-09 LAB — GLUCOSE, CAPILLARY: GLUCOSE-CAPILLARY: 104 mg/dL — AB (ref 70–99)

## 2014-03-09 MED ORDER — BISACODYL 10 MG RE SUPP: 10.0000 mg | Freq: Every day | RECTAL | Status: DC | PRN

## 2014-03-09 NOTE — Progress Notes (Signed)
Patient ID: Marissa Parsons, female   DOB: 04-26-1929, 80 y.o.   MRN: [id number] Afeb, vss No new neuro issues Looks good, but some concern regarding disposition. Will decide in next 1 to 2 days what her best plan is

## 2014-03-09 NOTE — Progress Notes (Signed)
Physical Therapy Treatment Patient Details Name: Marissa Parsons MRN: [id number] DOB: [birth year] Today's Date: 03/09/2014    History of Present Illness Pt is an [name] s/p lumbar laminectomy/decompression on 03/06/14. Pt was admitted 03/03/14 due to Bil LE pain and weakness and history of falls over the past few weeks.     PT Comments    Patient agreeable to ambulation despite complaints of pain but declined stairs this morning. Will continue with current POC  Follow Up Recommendations  Home health PT;Other (comment)     Equipment Recommendations  None recommended by PT    Recommendations for Other Services       Precautions / Restrictions Precautions Precautions: Back;Fall Precaution Comments: patient able to recall precautions    Mobility  Bed Mobility         Supine to sit: Min guard     General bed mobility comments: reinforced log roll and coming up from side  Transfers Overall transfer level: Needs assistance Equipment used: Rolling walker (2 wheeled)   Sit to Stand: Supervision         General transfer comment: Used safe technique. Supervision for safety  Ambulation/Gait Ambulation/Gait assistance: Supervision Ambulation Distance (Feet): 300 Feet Assistive device: Rolling walker (2 wheeled) Gait Pattern/deviations: Step-through pattern Gait velocity: slow   General Gait Details: Continues with left hip bias with gait. Otherwise safe and steady   Information systems manager Rankin (Stroke Patients Only)       Balance                                    Cognition Arousal/Alertness: Awake/alert Behavior During Therapy: WFL for tasks assessed/performed Overall Cognitive Status: Within Functional Limits for tasks assessed                      Exercises      General Comments        Pertinent Vitals/Pain 10/10 back pain RN provided medication to assist with pain control      Home Living                      Prior Function            PT Goals (current goals can now be found in the care plan section) Progress towards PT goals: Progressing toward goals    Frequency  Min 5X/week    PT Plan Current plan remains appropriate    Co-evaluation             End of Session Equipment Utilized During Treatment: Gait belt Activity Tolerance: Patient tolerated treatment well Patient left: in chair;with call bell/phone within reach;with chair alarm set     Time: 9242-6834 PT Time Calculation (min): 14 min  Charges:  $Gait Training: 8-22 mins                    G Codes:      Fredrich Birks 03/09/2014, 1:53 PM 03/09/2014 Fredrich Birks PTA [phone] pager [phone] office

## 2014-03-10 NOTE — Progress Notes (Signed)
Occupational Therapy Treatment Patient Details Name: KRISHIKA ZARAZUA MRN: [id number] DOB: 05/22/1929 Today's Date: 03/10/2014    History of present illness Pt is an [name] s/p lumbar laminectomy/decompression on 03/06/14. Pt was admitted 03/03/14 due to Bil LE pain and weakness and history of falls over the past few weeks.    OT comments  Pt seen today for ADLs and functional mobility. Pt overall at Supervision level for functional mobility and transfers. Educated pt on home safety, fall prevention, and incorporating back precautions into ADLs to promote safety and independence with ADLs.    Follow Up Recommendations  Home health OT;Supervision/Assistance - 24 hour    Equipment Recommendations  3 in 1 bedside comode       Precautions / Restrictions Precautions Precautions: Back;Fall Precaution Comments: patient able to recall precautions Restrictions Weight Bearing Restrictions: No       Mobility Bed Mobility               General bed mobility comments: Pt in recliner before/after OT session. Reinforced Log roll technique.  Transfers Overall transfer level: Needs assistance Equipment used: Rolling walker (2 wheeled) Transfers: Sit to/from Stand Sit to Stand: Supervision         General transfer comment: Used safe technique. Supervision for safety        ADL Overall ADL's : Needs assistance/impaired             Lower Body Bathing: Minimal assistance;Sit to/from stand Lower Body Bathing Details (indicate cue type and reason): pt able to reach LLE but not RLE without bending back. Discussed use of long handled sponge, or looping hand towel under foot for cleaning. Pt is not able to perform figure four position for LB ADLs.      Lower Body Dressing: Minimal assistance;Sit to/from stand;Adhering to back precautions Lower Body Dressing Details (indicate cue type and reason): Pt able to don/doff R sock, however able to doff L sock but not don without  bending back. Pt plans to have family assist with socks. Pt could likely thread Bil LEs through loose clothing and discussed wearing pants with elastic waist bands. Toilet Transfer: Supervision/safety;Ambulation;BSC;RW       Tub/ Shower Transfer: Walk-in shower;Supervision/safety;Ambulation;3 in 1;Rolling walker   Functional mobility during ADLs: Supervision/safety;Rolling walker General ADL Comments: Pt is motivated to return home and mobility is at Supervision level. Educated pt on fall prevention and home safety and again stressed back precautions and incorporating into ADLs.                 Cognition  Arousal/Alertness: Awake/Alert Behavior During Therapy: WFL for tasks assessed/performed Overall Cognitive Status: Within Functional Limits for tasks assessed                                    Pertinent Vitals/ Pain       NAD         Frequency Min 2X/week     Progress Toward Goals  OT Goals(current goals can now be found in the care plan section)  Progress towards OT goals: Progressing toward goals     Plan Discharge plan needs to be updated       End of Session Equipment Utilized During Treatment: Gait belt;Rolling walker   Activity Tolerance Patient tolerated treatment well   Patient Left in chair;with call bell/phone within reach;with chair alarm set  Time: 1710-1737 OT Time Calculation (min): 27 min  Charges: OT General Charges $OT Visit: 1 Procedure OT Treatments $Self Care/Home Management : 23-37 mins  Rae Lips [phone] 03/10/2014, 6:32 PM

## 2014-03-10 NOTE — Progress Notes (Signed)
Patient reports feeling better. Back pain better controlled. Walking better with therapy. Able to go up and down some stairs. Patient considering discharge home rather than skilled nursing facility.  Afebrile. Vitals are stable. Wound clean and dry. Chest and abdomen benign.  Status post lumbar decompressive surgery. Discharge plan to be finalized tomorrow. Patient likely to be discharged home with home therapy

## 2014-03-11 MED ORDER — OXYCODONE-ACETAMINOPHEN 5-325 MG PO TABS: 1.0000 | ORAL_TABLET | Freq: Four times a day (QID) | ORAL | Status: DC | PRN

## 2014-03-11 NOTE — Discharge Instructions (Signed)
Wound Care You may take a shower and get your incision wet. Do not put any creams, lotions, or ointments on incision.  Activity Walk each and every day, increasing distance each day. No lifting greater than 5 lbs.  Avoid bending, arching, or twisting. If provided with back brace, wear when out of bed.  It is not necessary to wear in bed. Diet Resume your normal diet.  Return to Work Will be discussed at you follow up appointment. Call Your Doctor If Any of These Occur Redness, drainage, or swelling at the wound.  Temperature greater than 101 degrees. Severe pain not relieved by pain medication. Incision starts to come apart. Follow Up Appt Call today for appointment in 1-2 weeks ([phone]) or for problems.  If you have any hardware placed in your spine, you will need an x-ray before your appointment.

## 2014-03-11 NOTE — Discharge Summary (Signed)
Physician Discharge Summary  Patient ID: Marissa Parsons MRN: [id number] DOB/AGE: 1929-04-10 80 y.o.  Admit date: 03/03/2014 Discharge date: 03/11/2014  Admission Diagnoses: lumbar spinal stenosis with leg weakness    Discharge Diagnoses: same   Discharged Condition: good  Hospital Course: The patient was admitted on 03/03/2014 with leg pain, numbness, and weakness from severe spinal stenosis. She was taken to the operating room a couple of days later where the patient underwent decompressive lumbar laminectomy. The patient tolerated the procedure well and was taken to the recovery room and then to the floor in stable condition. The hospital course was routine. There were no complications. The wound remained clean dry and intact. Pt had appropriate back soreness. No complaints of leg pain or new N/T/W. The patient remained afebrile with stable vital signs, and tolerated a regular diet. The patient continued to increase activities with physical therapy, and pain was well controlled with oral pain medications. She was discharged home with plans for home health physical therapy. Her pain and numbness were much improved as compared to preoperative status.  Consults: None  Significant Diagnostic Studies:  Results for orders placed during the hospital encounter of 03/03/14  CBC WITH DIFFERENTIAL      Result Value Ref Range   WBC 6.3  4.0 - 10.5 K/uL   RBC 3.95  3.87 - 5.11 MIL/uL   Hemoglobin 14.3  12.0 - 15.0 g/dL   HCT 13.2  44.0 - 10.2 %   MCV 108.4 (*) 78.0 - 100.0 fL   MCH 36.2 (*) 26.0 - 34.0 pg   MCHC 33.4  30.0 - 36.0 g/dL   RDW 72.5  36.6 - 44.0 %   Platelets 118 (*) 150 - 400 K/uL   Neutrophils Relative % 55  43 - 77 %   Neutro Abs 3.5  1.7 - 7.7 K/uL   Lymphocytes Relative 37  12 - 46 %   Lymphs Abs 2.3  0.7 - 4.0 K/uL   Monocytes Relative 6  3 - 12 %   Monocytes Absolute 0.4  0.1 - 1.0 K/uL   Eosinophils Relative 2  0 - 5 %   Eosinophils Absolute 0.1  0.0 - 0.7 K/uL    Basophils Relative 0  0 - 1 %   Basophils Absolute 0.0  0.0 - 0.1 K/uL  COMPREHENSIVE METABOLIC PANEL      Result Value Ref Range   Sodium 147  137 - 147 mEq/L   Potassium 3.8  3.7 - 5.3 mEq/L   Chloride 107  96 - 112 mEq/L   CO2 29  19 - 32 mEq/L   Glucose, Bld 86  70 - 99 mg/dL   BUN 14  6 - 23 mg/dL   Creatinine, Ser 3.47  0.50 - 1.10 mg/dL   Calcium 42.5 (*) 8.4 - 10.5 mg/dL   Total Protein 6.3  6.0 - 8.3 g/dL   Albumin 3.2 (*) 3.5 - 5.2 g/dL   AST 46 (*) 0 - 37 U/L   ALT 19  0 - 35 U/L   Alkaline Phosphatase 58  39 - 117 U/L   Total Bilirubin 0.4  0.3 - 1.2 mg/dL   GFR calc non Af Amer 76 (*) >90 mL/min   GFR calc Af Amer 88 (*) >90 mL/min  URINALYSIS, ROUTINE W REFLEX MICROSCOPIC      Result Value Ref Range   Color, Urine YELLOW  YELLOW   APPearance CLOUDY (*) CLEAR   Specific Gravity, Urine 1.014  1.005 -  1.030   pH 7.5  5.0 - 8.0   Glucose, UA NEGATIVE  NEGATIVE mg/dL   Hgb urine dipstick TRACE (*) NEGATIVE   Bilirubin Urine NEGATIVE  NEGATIVE   Ketones, ur NEGATIVE  NEGATIVE mg/dL   Protein, ur NEGATIVE  NEGATIVE mg/dL   Urobilinogen, UA 0.2  0.0 - 1.0 mg/dL   Nitrite NEGATIVE  NEGATIVE   Leukocytes, UA LARGE (*) NEGATIVE  CK      Result Value Ref Range   Total CK 37  7 - 177 U/L  URINE MICROSCOPIC-ADD ON      Result Value Ref Range   Squamous Epithelial / LPF MANY (*) RARE   WBC, UA TOO NUMEROUS TO COUNT  <3 WBC/hpf   RBC / HPF 0-2  <3 RBC/hpf   Bacteria, UA FEW (*) RARE  URINALYSIS, ROUTINE W REFLEX MICROSCOPIC      Result Value Ref Range   Color, Urine YELLOW  YELLOW   APPearance CLEAR  CLEAR   Specific Gravity, Urine 1.013  1.005 - 1.030   pH 7.5  5.0 - 8.0   Glucose, UA NEGATIVE  NEGATIVE mg/dL   Hgb urine dipstick NEGATIVE  NEGATIVE   Bilirubin Urine NEGATIVE  NEGATIVE   Ketones, ur NEGATIVE  NEGATIVE mg/dL   Protein, ur NEGATIVE  NEGATIVE mg/dL   Urobilinogen, UA 0.2  0.0 - 1.0 mg/dL   Nitrite NEGATIVE  NEGATIVE   Leukocytes, UA NEGATIVE   NEGATIVE  PROTIME-INR      Result Value Ref Range   Prothrombin Time 13.2  11.6 - 15.2 seconds   INR 1.02  0.00 - 1.49  GLUCOSE, CAPILLARY      Result Value Ref Range   Glucose-Capillary 104 (*) 70 - 99 mg/dL    Dg Lumbar Spine Complete  03/03/2014   CLINICAL DATA:  Pain.  No injury.  EXAM: LUMBAR SPINE - COMPLETE 4+ VIEW  COMPARISON:  10/08/2013  FINDINGS: Mild curvature convex to the right. Vertebral body heights are within normal. There is moderate spondylosis. There is moderate disc space narrowing at the L3-4, L4-5 and L5-S1 levels and to a lesser extent at the L1-2 level as these findings are stable. There is no compression fracture or subluxation. There is moderate facet arthropathy. There is moderate calcified plaque involving the abdominal aorta and iliac vessels. Remainder the exam is unchanged.  IMPRESSION: No acute findings.  Moderate spondylosis with moderate multilevel disc disease   Electronically Signed   By: Elberta Fortis [name]   On: 03/03/2014 08:39   Mr Lumbar Spine Wo Contrast  03/03/2014   CLINICAL DATA:  New severe bilateral lower leg pain. Chronic low back pain. Urinary retention.  EXAM: MRI LUMBAR SPINE WITHOUT CONTRAST  TECHNIQUE: Multiplanar, multisequence MR imaging of the lumbar spine was performed. No intravenous contrast was administered.  COMPARISON:  Lumbar spine radiographs 03/03/2014. MRI lumbar spine without contrast 07/15/2012.  FINDINGS: Normal signal is present in the conus medullaris which terminates at L1. Progressive endplate changes and loss of disc height are present at L4-5. Chronic endplate changes are again noted L5-S1. Schmorl's nodes are stable. Vertebral body heights are maintained.  Limited imaging of the abdomen is unremarkable.  L1-2: A shallow central disc protrusion is present without significant stenosis.  L2-3: Mild facet hypertrophy is present bilaterally. There is mild broad-based disc bulging. No significant stenosis is present.  L3-4: A  broad-based disc protrusion is present. Moderate facet hypertrophy has progressed. Progressive lateral recess narrowing is worse on the left.  Mild foraminal narrowing is stable, slightly worse on the right.  L4-5: Severe central canal stenosis has progressed with a broad-based disc protrusion, advanced facet hypertrophy, and ligamentum flavum thickening. Moderate foraminal stenosis is worse as well, right greater than left.  L5-S1: A broad-based disc protrusion is asymmetric to the right. Moderate facet hypertrophy and ligamentum flavum thickening are again noted. Moderate central canal stenosis is present with right greater than left lateral recess narrowing. Mild foraminal stenosis bilaterally is stable.  S1-2: A diminutive disc is present. There is no significant protrusion or stenosis.  IMPRESSION: 1. Progressive broad-based disc protrusion and advanced facet hypertrophy at L4-5 with severe central canal stenosis and moderate foraminal narrowing bilaterally, right greater than left. 2. Moderate lateral recess and mild foraminal stenosis at L5-S1 is stable, worse on the right. 3. Progressive lateral recess narrowing at L3-4 is worse on the left. 4. Mild foraminal narrowing at L3-4 is stable, worse on the right. 5. Stable facet hypertrophy and disc bulging at L2-3 without significant stenosis.   Electronically Signed   By: Gennette Pac [name]   On: 03/03/2014 11:35   Dg Lumbar Spine 1 View  03/06/2014   CLINICAL DATA:  Spine surgery and surgical localization.  EXAM: LUMBAR SPINE - 1 VIEW  COMPARISON:  MRI of lumbar spine 03/03/2014.  FINDINGS: Single lateral view was obtained. The lumbar spine was labeled similar to the recent MRI of the lumbar spine. There is marked disc space narrowing at L4-L5 and L5-S1. Surgical markers at the level of L4 and L5. Atherosclerotic disease in the aorta.  IMPRESSION: Surgical marking at L4 and L5.   Electronically Signed   By: Richarda Overlie [name]   On: 03/06/2014 17:44     Antibiotics:  Anti-infectives   Start     Dose/Rate Route Frequency Ordered Stop   03/07/14 0440  vancomycin (VANCOCIN) IVPB 1000 mg/200 mL premix     1,000 mg 200 mL/hr over 60 Minutes Intravenous  Once 03/06/14 1959 03/07/14 0639   03/06/14 1723  bacitracin 50,000 Units in sodium chloride irrigation 0.9 % 500 mL irrigation  Status:  Discontinued       As needed 03/06/14 1723 03/06/14 1849   03/06/14 1635  vancomycin (VANCOCIN) 1 GM/200ML IVPB    Comments:  Vinnie Langton   : cabinet override      03/06/14 1635 03/07/14 0444   03/03/14 0830  cefTRIAXone (ROCEPHIN) 1 g in dextrose 5 % 50 mL IVPB  Status:  Discontinued     1 g 100 mL/hr over 30 Minutes Intravenous  Once 03/03/14 0823 03/03/14 0950      Discharge Exam: Blood pressure 110/55, pulse 88, temperature 99.5 F (37.5 C), temperature source Oral, resp. rate 19, height 5\' 4"  (1.626 m), weight 59.648 kg (131 lb 8 oz), SpO2 97.00%. Neurologic: Grossly normal Incision clean dry and intact  Discharge Medications:     Medication List         alendronate 70 MG tablet  Commonly known as:  FOSAMAX  Take 70 mg by mouth once a week. Take on Monday.Take with a full glass of water on an empty stomach.     aspirin EC 81 MG tablet  Take 81 mg by mouth daily.     bisoprolol-hydrochlorothiazide 5-6.25 MG per tablet  Commonly known as:  ZIAC  Take 1 tablet by mouth daily.     diazepam 5 MG tablet  Commonly known as:  VALIUM  Take 1 tablet (5 mg total) by mouth  at bedtime.     diltiazem 240 MG 24 hr capsule  Commonly known as:  CARDIZEM CD  Take 240 mg by mouth daily.     folic acid 1 MG tablet  Commonly known as:  FOLVITE  Take 1 mg by mouth daily.     levothyroxine 50 MCG tablet  Commonly known as:  SYNTHROID, LEVOTHROID  Take 50-100 mcg by mouth See admin instructions. Take 2 tablets (100 mcg) on Monday and Friday, take 1 tablet (50 mcg) on Sunday, Tuesday, Wednesday, Thursday, Saturday     methotrexate 2.5 MG  tablet  Commonly known as:  RHEUMATREX  Take 10 mg by mouth 2 (two) times a week. On Tuesdays and Wednesdays     oxyCODONE-acetaminophen 5-325 MG per tablet  Commonly known as:  PERCOCET/ROXICET  Take 1 tablet by mouth every 6 (six) hours as needed for moderate pain.     predniSONE 5 MG tablet  Commonly known as:  DELTASONE  Take 5 mg by mouth daily.        Disposition: home with home health   Final Dx: decompressive lumbar laminectomy for spinal stenosis      Discharge Instructions   Call MD for:  difficulty breathing, headache or visual disturbances    Complete by:  As directed      Call MD for:  persistant nausea and vomiting    Complete by:  As directed      Call MD for:  redness, tenderness, or signs of infection (pain, swelling, redness, odor or green/yellow discharge around incision site)    Complete by:  As directed      Call MD for:  severe uncontrolled pain    Complete by:  As directed      Call MD for:  temperature >100.4    Complete by:  As directed      Diet - low sodium heart healthy    Complete by:  As directed      Discharge instructions    Complete by:  As directed   No bending or twisting, may shower normally, no heavy lifting, no driving     Increase activity slowly    Complete by:  As directed            Follow-up Information   Follow up with Domonik Levario S, MD. Schedule an appointment as soon as possible for a visit in 2 weeks.   Specialty:  Neurosurgery   Contact information:   [address] [place] [address] [phone]        Signed: Tia Alert 03/11/2014, 6:53 AM

## 2014-03-13 NOTE — Plan of Care (Addendum)
Problem: Consults Goal: Diagnosis - Spinal Surgery Outcome: Completed/Met Date Met:  03/13/14 Lumbar Laminectomy (Complex)     

## 2014-03-13 NOTE — Progress Notes (Signed)
Pt. Alert and oriented,follows simple instructions, denies pain. Incision area without swelling, redness or S/S of infection. Voiding adequate clear yellow urine. Moving all extremities and vitals stable and documented. Lumbar surgery notes instructions given to patient and family member for home safety and precautions. Pt. and family stated understanding of instructions given.

## 2014-03-20 ENCOUNTER — Inpatient Hospital Stay (HOSPITAL_COMMUNITY)
Admission: EM | Admit: 2014-03-20 | Discharge: 2014-03-27 | DRG: 863 | Disposition: A | Discharge status: Home Health | Payer: Medicare HMO | Attending: Neurological Surgery | Admitting: Neurological Surgery

## 2014-03-20 ENCOUNTER — Emergency Department (HOSPITAL_COMMUNITY): Payer: Medicare HMO

## 2014-03-20 DIAGNOSIS — A419 Sepsis, unspecified organism: Secondary | ICD-10-CM

## 2014-03-20 DIAGNOSIS — A4901 Methicillin susceptible Staphylococcus aureus infection, unspecified site: Secondary | ICD-10-CM

## 2014-03-20 DIAGNOSIS — Z87891 Personal history of nicotine dependence: Secondary | ICD-10-CM

## 2014-03-20 DIAGNOSIS — Z86718 Personal history of other venous thrombosis and embolism: Secondary | ICD-10-CM

## 2014-03-20 DIAGNOSIS — I10 Essential (primary) hypertension: Secondary | ICD-10-CM | POA: Diagnosis present

## 2014-03-20 DIAGNOSIS — M069 Rheumatoid arthritis, unspecified: Secondary | ICD-10-CM | POA: Diagnosis present

## 2014-03-20 DIAGNOSIS — Z66 Do not resuscitate: Secondary | ICD-10-CM | POA: Diagnosis present

## 2014-03-20 DIAGNOSIS — H3530 Unspecified macular degeneration: Secondary | ICD-10-CM | POA: Diagnosis present

## 2014-03-20 DIAGNOSIS — J4489 Other specified chronic obstructive pulmonary disease: Secondary | ICD-10-CM | POA: Diagnosis present

## 2014-03-20 DIAGNOSIS — R509 Fever, unspecified: Secondary | ICD-10-CM | POA: Diagnosis present

## 2014-03-20 DIAGNOSIS — N390 Urinary tract infection, site not specified: Secondary | ICD-10-CM | POA: Diagnosis present

## 2014-03-20 DIAGNOSIS — D72829 Elevated white blood cell count, unspecified: Secondary | ICD-10-CM | POA: Diagnosis present

## 2014-03-20 DIAGNOSIS — Z7982 Long term (current) use of aspirin: Secondary | ICD-10-CM

## 2014-03-20 DIAGNOSIS — Y838 Other surgical procedures as the cause of abnormal reaction of the patient, or of later complication, without mention of misadventure at the time of the procedure: Secondary | ICD-10-CM | POA: Diagnosis present

## 2014-03-20 DIAGNOSIS — E039 Hypothyroidism, unspecified: Secondary | ICD-10-CM | POA: Diagnosis present

## 2014-03-20 DIAGNOSIS — T814XXS Infection following a procedure, sequela: Secondary | ICD-10-CM

## 2014-03-20 DIAGNOSIS — T8149XA Infection following a procedure, other surgical site, initial encounter: Secondary | ICD-10-CM

## 2014-03-20 DIAGNOSIS — E46 Unspecified protein-calorie malnutrition: Secondary | ICD-10-CM | POA: Diagnosis present

## 2014-03-20 DIAGNOSIS — Z9889 Other specified postprocedural states: Secondary | ICD-10-CM

## 2014-03-20 DIAGNOSIS — F0390 Unspecified dementia without behavioral disturbance: Secondary | ICD-10-CM | POA: Diagnosis present

## 2014-03-20 DIAGNOSIS — R0902 Hypoxemia: Secondary | ICD-10-CM | POA: Diagnosis present

## 2014-03-20 DIAGNOSIS — IMO0002 Reserved for concepts with insufficient information to code with codable children: Principal | ICD-10-CM | POA: Diagnosis present

## 2014-03-20 DIAGNOSIS — J449 Chronic obstructive pulmonary disease, unspecified: Secondary | ICD-10-CM | POA: Diagnosis present

## 2014-03-20 DIAGNOSIS — M1990 Unspecified osteoarthritis, unspecified site: Secondary | ICD-10-CM | POA: Diagnosis present

## 2014-03-20 DIAGNOSIS — IMO0001 Reserved for inherently not codable concepts without codable children: Secondary | ICD-10-CM

## 2014-03-20 DIAGNOSIS — Z79899 Other long term (current) drug therapy: Secondary | ICD-10-CM

## 2014-03-20 LAB — URINE MICROSCOPIC-ADD ON

## 2014-03-20 LAB — URINALYSIS, ROUTINE W REFLEX MICROSCOPIC
Bilirubin Urine: NEGATIVE
Glucose, UA: NEGATIVE mg/dL
KETONES UR: NEGATIVE mg/dL
NITRITE: NEGATIVE
PROTEIN: 30 mg/dL — AB
Specific Gravity, Urine: 1.015 (ref 1.005–1.030)
UROBILINOGEN UA: 0.2 mg/dL (ref 0.0–1.0)
pH: 6.5 (ref 5.0–8.0)

## 2014-03-20 LAB — CBC WITH DIFFERENTIAL/PLATELET
BASOS PCT: 0 % (ref 0–1)
Basophils Absolute: 0 10*3/uL (ref 0.0–0.1)
Eosinophils Absolute: 0 10*3/uL (ref 0.0–0.7)
Eosinophils Relative: 0 % (ref 0–5)
HCT: 38.5 % (ref 36.0–46.0)
Hemoglobin: 12.6 g/dL (ref 12.0–15.0)
LYMPHS ABS: 1.6 10*3/uL (ref 0.7–4.0)
Lymphocytes Relative: 10 % — ABNORMAL LOW (ref 12–46)
MCH: 34.3 pg — AB (ref 26.0–34.0)
MCHC: 32.7 g/dL (ref 30.0–36.0)
MCV: 104.9 fL — ABNORMAL HIGH (ref 78.0–100.0)
Monocytes Absolute: 0.9 10*3/uL (ref 0.1–1.0)
Monocytes Relative: 5 % (ref 3–12)
NEUTROS ABS: 14 10*3/uL — AB (ref 1.7–7.7)
NEUTROS PCT: 85 % — AB (ref 43–77)
Platelets: 261 10*3/uL (ref 150–400)
RBC: 3.67 MIL/uL — AB (ref 3.87–5.11)
RDW: 14.1 % (ref 11.5–15.5)
WBC: 16.5 10*3/uL — ABNORMAL HIGH (ref 4.0–10.5)

## 2014-03-20 LAB — COMPREHENSIVE METABOLIC PANEL
ALBUMIN: 3.2 g/dL — AB (ref 3.5–5.2)
ALK PHOS: 57 U/L (ref 39–117)
ALT: 30 U/L (ref 0–35)
AST: 65 U/L — ABNORMAL HIGH (ref 0–37)
BUN: 22 mg/dL (ref 6–23)
CO2: 26 mEq/L (ref 19–32)
Calcium: 10.1 mg/dL (ref 8.4–10.5)
Chloride: 98 mEq/L (ref 96–112)
Creatinine, Ser: 0.79 mg/dL (ref 0.50–1.10)
GFR calc Af Amer: 87 mL/min — ABNORMAL LOW (ref >90)
GFR calc non Af Amer: 75 mL/min — ABNORMAL LOW (ref >90)
GLUCOSE: 135 mg/dL — AB (ref 70–99)
POTASSIUM: 3.4 meq/L — AB (ref 3.7–5.3)
SODIUM: 139 meq/L (ref 137–147)
Total Bilirubin: 0.4 mg/dL (ref 0.3–1.2)
Total Protein: 6.3 g/dL (ref 6.0–8.3)

## 2014-03-20 LAB — LACTIC ACID, PLASMA: Lactic Acid, Venous: 1.2 mmol/L (ref 0.5–2.2)

## 2014-03-20 MED ORDER — ONDANSETRON 4 MG PO TBDP
8.0000 mg | ORAL_TABLET | Freq: Once | ORAL | Status: AC
  Administered 2014-03-20: 8 mg via ORAL
  Filled 2014-03-20: qty 2

## 2014-03-20 MED ORDER — ONDANSETRON HCL 4 MG/2ML IJ SOLN: 4.0000 mg | Freq: Once | INTRAMUSCULAR | Status: DC

## 2014-03-20 MED ORDER — SODIUM CHLORIDE 0.9 % IV BOLUS (SEPSIS)
500.0000 mL | Freq: Once | INTRAVENOUS | Status: AC
  Administered 2014-03-20: 500 mL via INTRAVENOUS

## 2014-03-20 MED ORDER — GADOBENATE DIMEGLUMINE 529 MG/ML IV SOLN
10.0000 mL | Freq: Once | INTRAVENOUS | Status: AC | PRN
Start: 2014-03-20 — End: 2014-03-20
  Administered 2014-03-20: 10 mL via INTRAVENOUS

## 2014-03-20 MED ORDER — CEFTRIAXONE SODIUM 1 G IJ SOLR
1.0000 g | Freq: Once | INTRAMUSCULAR | Status: AC
  Administered 2014-03-21: 1 g via INTRAVENOUS
  Filled 2014-03-20: qty 10

## 2014-03-20 MED ORDER — SODIUM CHLORIDE 0.9 % IV BOLUS (SEPSIS)
500.0000 mL | Freq: Once | INTRAVENOUS | Status: AC
  Administered 2014-03-21: 500 mL via INTRAVENOUS

## 2014-03-20 MED ORDER — IBUPROFEN 400 MG PO TABS
600.0000 mg | ORAL_TABLET | Freq: Once | ORAL | Status: AC
  Administered 2014-03-20: 600 mg via ORAL
  Filled 2014-03-20 (×2): qty 1

## 2014-03-20 MED ORDER — SODIUM CHLORIDE 0.9 % IV BOLUS (SEPSIS): 1000.0000 mL | Freq: Once | INTRAVENOUS | Status: DC

## 2014-03-20 NOTE — ED Notes (Signed)
MD at bedside. 

## 2014-03-20 NOTE — Progress Notes (Signed)
Patient ID: Marissa Parsons, female   DOB: 1929/09/17, 80 y.o.   MRN: [id number] I reviewed her MRI and I think it shows expected findings at this early stage after surgery. It shows expected post-op seroma with mild enhancement which would be seen at  this stage after any multilevel decompression. Cannot totally rule out infection with MRI, but I think we have a source of fever and WBC with UTI. Would treat UTI appropriately and watch wound/ clinical response expectantly. I saw her in office just yesterday and it appears her wound is unchanged and clinically she was doing well.

## 2014-03-20 NOTE — ED Notes (Signed)
Per EMS: pt coming from home with c/o fever, vomiting, post op surgical site with redness. Pt reported 101.2 at home, EMT on scene gave 1000 mg of tylenol at 1845. Pt denies any other complaints. Pt A&Ox4, respirations equal and unlabored, skin warm and dry

## 2014-03-20 NOTE — ED Notes (Signed)
Pt is in MRI  

## 2014-03-20 NOTE — ED Provider Notes (Signed)
CSN: [id number]     Arrival date & time 03/20/14  1933 History   First MD Initiated Contact with Patient 03/20/14 1959     Chief Complaint  Patient presents with  . Emesis  . Fever  . Post-op Problem     (Consider location/radiation/quality/duration/timing/severity/associated sxs/prior Treatment) Patient is a 80 y.o. female presenting with vomiting and fever. The history is provided by the patient.  Emesis Severity:  Mild Timing:  Intermittent Quality:  Stomach contents Progression:  Unchanged Chronicity:  New Relieved by:  Nothing Worsened by:  Nothing tried Ineffective treatments:  None tried Associated symptoms: fever   Associated symptoms: no abdominal pain, no diarrhea and no headaches   Fever:    Timing:  Constant   Max temp PTA (F):  101   Temp source:  Oral Fever Associated symptoms: cough, nausea and vomiting   Associated symptoms: no chest pain, no congestion, no diarrhea, no dysuria and no headaches     Past Medical History  Diagnosis Date  . Hypertension   . Rheumatoid arteritis   . Phlebitis   . Deep vein thrombophlebitis of leg   . Fractured pelvis     fall 2011  . Thyroid disease     hypothyroidism  . Humerus fracture   . Thrombocytopenia   . Leukocytosis   . Hypothyroidism   . Phlebitis      Recurrent phlebitis  . Hematoma      Right superior and inferior pubic rami fractures with hematoma adjacent to the right ramus fracture  . Urinary tract infection   . Humerus fracture      Right proximal humerus fracture  . Osteoarthritis     for which she takes chronic prednisone  . Macular degeneration   . Chronic anticoagulation   . Fracture of multiple pubic rami 01/16/2013  . Dementia     MILD   Past Surgical History  Procedure Laterality Date  . Left hip open reduction    . Cholecystectomy    . Abdominal hysterectomy  s  . Sinus surgery with instatrak    . Lumbar laminectomy/decompression microdiscectomy N/A 03/06/2014    Procedure: LUMBAR  LAMINECTOMY/DECOMPRESSION LUMBAR THREE, FOUR, AND FIVE ;  Surgeon: Eustace Moore, MD;  Location: [facility];  Service: Neurosurgery;  Laterality: N/A;  LUMBAR LAMINECTOMY/DECOMPRESSION LUMBAR THREE, FOUR, AND FIVE    Family History  Problem Relation Age of Onset  . Abnormal EKG Neg Hx   . Abnormal newborn screen Neg Hx   . Achalasia Neg Hx   . Achondroplasia Neg Hx   . Acne Neg Hx   . Acromegaly Neg Hx   . Actinic keratosis Neg Hx   . Acute lymphoblastic leukemia Neg Hx   . Addison's disease Neg Hx   . Adrenal disorder Neg Hx   . Albinism Neg Hx   . Alcohol abuse Neg Hx   . Alkaptonuria Neg Hx   . Allergic rhinitis Neg Hx   . Allergies Neg Hx   . Allergy (severe) Neg Hx   . Alopecia Neg Hx   . Alpha-1 antitrypsin deficiency Neg Hx   . Alport syndrome Neg Hx   . ALS Neg Hx   . Alzheimer's disease Neg Hx   . Ambiguous genitalia Neg Hx   . Amblyopia Neg Hx   . Amenorrhea Neg Hx   . Anal fissures Neg Hx   . Anemia Neg Hx   . Anesthesia problems Neg Hx   . Aneurysm Neg Hx   .  Angelman syndrome Neg Hx   . Angina Neg Hx   . Angioedema Neg Hx   . Ankylosing spondylitis Neg Hx   . Anorectal malformation Neg Hx   . Anorexia nervosa Neg Hx   . Anti-cardiolipin syndrome Neg Hx   . Antithrombin III deficiency Neg Hx   . Anuerysm Neg Hx   . Anxiety disorder Neg Hx   . Aortic aneurysm Neg Hx   . Aortic dissection Neg Hx   . Aortic stenosis Neg Hx   . Aphthous stomatitis Neg Hx   . Aplastic anemia Neg Hx   . Appendicitis Neg Hx   . Apraxia Neg Hx   . Arnold-Chiari malformation Neg Hx   . Arrhythmia Neg Hx   . Arthritis Neg Hx   . Asperger's syndrome Neg Hx   . Asthma Neg Hx   . Ataxia Neg Hx   . Ataxia telangiectasia Neg Hx   . Atopy Neg Hx   . Atrial fibrillation Neg Hx   . Atrophic kidney Neg Hx   . Auditory processing disorder Neg Hx   . Autism Neg Hx   . Autism spectrum disorder Neg Hx   . Autoimmune disease Neg Hx   . AVM Neg Hx   . Baker's cyst Neg Hx   .  Barrett's esophagus Neg Hx   . Bartter's syndrome Neg Hx   . Basal cell carcinoma Neg Hx   . Behavior problems Neg Hx   . Bell's palsy Neg Hx   . Benign prostatic hyperplasia Neg Hx   . Bipolar disorder Neg Hx   . Birth defects Neg Hx   . Birth marks Neg Hx   . Blindness Neg Hx   . Bone cancer Neg Hx   . BOR syndrome Neg Hx   . Bow legs Neg Hx   . Bradycardia Neg Hx   . Brain cancer Neg Hx   . BRCA 1/2 Neg Hx   . Breast cancer Neg Hx   . Broken bones Neg Hx   . Bronchiolitis Neg Hx   . Bronchopulmonary dysplasia Neg Hx   . Bruton's disease Neg Hx   . Bulemia Neg Hx   . Bullous pemphigoid Neg Hx   . Bunion Neg Hx   . Bursitis Neg Hx   . Caf-au-lait spots Neg Hx   . Calcium disorder Neg Hx   . Canavan disease Neg Hx   . Cancer Neg Hx   . Cardiomyopathy Neg Hx   . Carpal tunnel syndrome Neg Hx   . Cataracts Neg Hx   . Celiac disease Neg Hx   . Cerebral aneurysm Neg Hx   . Cerebral palsy Neg Hx   . Cervical cancer Neg Hx   . Cervical polyp Neg Hx   . Cervicitis Neg Hx   . Chalasia Neg Hx   . Chalazion Neg Hx   . Charcot-Marie-Tooth disease Neg Hx   . Chediak-Higashi syndrome Neg Hx   . Chiari malformation Neg Hx   . Childhood respiratory disease Neg Hx   . Choanal atresia Neg Hx   . Cholecystitis Neg Hx   . Cholelithiasis Neg Hx   . Cholesteatoma Neg Hx   . Chondromalacia Neg Hx   . Chorea Neg Hx   . Chromosomal disorder Neg Hx   . Chronic bronchitis Neg Hx   . Chronic fatigue Neg Hx   . Chronic granulomatous disease Neg Hx   . Chronic infections Neg Hx   . Cirrhosis Neg Hx   .  Cleft lip Neg Hx   . Cleft palate Neg Hx   . Clotting disorder Neg Hx   . Club foot Neg Hx   . Coarctation of the aorta Neg Hx   . Colitis Neg Hx   . Collagen disease Neg Hx   . Colon cancer Neg Hx   . Colon polyps Neg Hx   . Colonic polyp Neg Hx   . Color blindness Neg Hx   . Conduct disorder Neg Hx   . Conductive hearing loss Neg Hx   . Congenital adrenal hyperplasia Neg Hx    . Congenital heart disease Neg Hx   . Conjunctivitis Neg Hx   . Consanguinity Neg Hx   . Constipation Neg Hx   . COPD Neg Hx   . Corneal abrasion Neg Hx   . Corneal ulcer Neg Hx   . Coronary aneurysm Neg Hx   . Coronary artery disease Neg Hx   . Cowden syndrome Neg Hx   . Craniosynostosis Neg Hx   . Cri-du-chat syndrome Neg Hx   . Crohn's disease Neg Hx   . Cushing syndrome Neg Hx   . Cystic fibrosis Neg Hx   . Cystic kidney disease Neg Hx   . Cystinosis Neg Hx   . Decreased libido Neg Hx   . Deep vein thrombosis Neg Hx   . Delayed menopause Neg Hx   . Delayed puberty Neg Hx   . Dementia Neg Hx   . Dental caries Neg Hx   . Depression Neg Hx   . Dermatomyositis Neg Hx   . DES usage Neg Hx   . Developmental delay Neg Hx   . Diabetes Neg Hx   . Diabetes insipidus Neg Hx   . Diabetes type I Neg Hx   . Diabetes type II Neg Hx   . Diabetic kidney disease Neg Hx   . DiGeorge syndrome Neg Hx   . Dilated cardiomyopathy Neg Hx   . Dislocations Neg Hx   . Diverticulitis Neg Hx   . Diverticulosis Neg Hx   . Down syndrome Neg Hx   . Drug abuse Neg Hx   . Dupuytren's contracture Neg Hx   . Dwarfism Neg Hx   . Dysfunctional uterine bleeding Neg Hx   . Dysmenorrhea Neg Hx   . Dyspareunia Neg Hx   . Dysphagia Neg Hx   . Dysrhythmia Neg Hx   . Dystonia Neg Hx   . Early death Neg Hx   . Early menopause Neg Hx   . Early puberty Neg Hx   . Eating disorder Neg Hx   . Eclampsia Neg Hx   . Ectodermal dysplasia Neg Hx   . Eczema Neg Hx   . Edema Neg Hx   . Edward's syndrome Neg Hx   . Ehlers-Danlos syndrome Neg Hx   . Emotional abuse Neg Hx   . Emphysema Neg Hx   . Encopresis Neg Hx   . Endocrine tumor Neg Hx   . Endocrinopathy Neg Hx   . Endometrial cancer Neg Hx   . Endometriosis Neg Hx   . Enuresis Neg Hx   . Eosinophilic granuloma Neg Hx   . Epididymitis Neg Hx   . Erectile dysfunction Neg Hx   . Erythema nodosum Neg Hx   . Esophageal cancer Neg Hx   . Esophageal  varices Neg Hx   . Esophagitis Neg Hx   . Exostosis Neg Hx   . Fabry's disease Neg Hx   . Factor  IX deficiency Neg Hx   . Factor V Leiden deficiency Neg Hx   . Factor VIII deficiency Neg Hx   . Failure to thrive Neg Hx   . Fainting Neg Hx   . Familial dysautonomia Neg Hx   . Familial nephritis Neg Hx   . Familial polyposis Neg Hx   . Fanconi anemia Neg Hx   . Febrile seizures Neg Hx   . Fibrocystic breast disease Neg Hx   . Fibroids Neg Hx   . Fibromyalgia Neg Hx   . Food intolerance Neg Hx   . Fragile X syndrome Neg Hx   . Friedreich's ataxia Neg Hx   . Frontotemporal dementia Neg Hx   . Fuch's dystrophy Neg Hx   . Gait disorder Neg Hx   . Galactosemia Neg Hx   . Gallbladder disease Neg Hx   . Gaucher's disease Neg Hx   . Genodermatoses Neg Hx   . GER disease Neg Hx   . Gestational diabetes Neg Hx   . GI problems Neg Hx   . Glaucoma Neg Hx   . Glomerulonephritis Neg Hx   . Glucose-6-phos deficiency Neg Hx   . Glycogen storage disease Neg Hx   . Goiter Neg Hx   . Gonadal disorder Neg Hx   . Gout Neg Hx   . Graves' disease Neg Hx   . Growth hormone deficiency Neg Hx   . GU problems Neg Hx   . Hammer toes Neg Hx   . Hartnup's disease Neg Hx   . Hashimoto's thyroiditis Neg Hx   . Healthy Neg Hx   . Hearing loss Neg Hx   . Heart block Neg Hx   . Heart defect Neg Hx   . Heart disease Neg Hx   . Heart failure Neg Hx   . Heart murmur Neg Hx   . Hemangiomas Neg Hx   . Hematuria Neg Hx   . Hemochromatosis Neg Hx   . Hemolytic uremic syndrome Neg Hx   . Hemophilia Neg Hx   . Henoch-Schonlein purpura Neg Hx   . Hepatitis Neg Hx   . Hepatomegaly Neg Hx   . Hereditary spherocytosis Neg Hx   . Hernia Neg Hx   . Hiatal hernia Neg Hx   . High arches Neg Hx   . Hip dysplasia Neg Hx   . Hip fracture Neg Hx   . Hirschsprung's disease Neg Hx   . Hirsutism Neg Hx   . Histiocytosis X Neg Hx   . HIV Neg Hx   . HLA-B27 positive Neg Hx   . Hodgkin's lymphoma Neg Hx   .  Homocystinuria Neg Hx   . Hunter's disease Neg Hx   . Huntington's disease Neg Hx   . Hydrocele Neg Hx   . Hydrocephalus Neg Hx   . Hygroma Neg Hx   . Hypercalcemia Neg Hx   . Hypereosinophilic syndrome Neg Hx   . Hyperinsulinemia Neg Hx   . Hyperkalemia Neg Hx   . Hyperlipidemia Neg Hx   . Hypermobility Neg Hx   . Hypernasality Neg Hx   . Hyperopia Neg Hx   . Hyperparathyroidism Neg Hx   . Hypersomnolence Neg Hx   . Hypertension Neg Hx   . Hyperthyroidism Neg Hx   . Hypertrophic cardiomyopathy Neg Hx   . Hypokalemia Neg Hx   . Hypoparathyroidism Neg Hx   . Hypoplastic kidney Neg Hx   . Hypotension Neg Hx   . Hypothyroidism Neg Hx   .  Idiopathic pulmonary fibrosis Neg Hx   . Idiopathic torsion dystonia Neg Hx   . IgA nephropathy Neg Hx   . Immunodeficiency Neg Hx   . Imperforate anus Neg Hx   . Impotence Neg Hx   . Impulse control disorder Neg Hx   . Incompetent cervix Neg Hx   . Infertility Neg Hx   . Inflammatory bowel disease Neg Hx   . Inguinal hernia Neg Hx   . Insomnia Neg Hx   . Insulin resistance Neg Hx   . Interstitial cystitis Neg Hx   . Intestinal malrotation Neg Hx   . Intestinal polyp Neg Hx   . Intracerebral hemorrhage Neg Hx   . Iron deficiency Neg Hx   . Irregular heart beat Neg Hx   . Irritable bowel syndrome Neg Hx   . Jaundice Neg Hx   . Job's syndrome Neg Hx   . Joint hypermobility Neg Hx   . Juvenile idiopathic arthritis Neg Hx   . Kartagener's syndrome Neg Hx   . Kawasaki disease Neg Hx   . Keloids Neg Hx   . Kennedy's disease Neg Hx   . Keratoconus Neg Hx   . Kidney cancer Neg Hx   . Kidney disease Neg Hx   . Kidney failure Neg Hx   . Kidney nephrosis Neg Hx   . Klinefelter's syndrome Neg Hx   . Krabbe disease Neg Hx   . Kyphosis Neg Hx   . Labyrinthitis Neg Hx   . Lactose intolerance Neg Hx   . Language disorder Neg Hx   . Lead poisoning Neg Hx   . Learning disabilities Neg Hx   . Legg-Calve-Perthes disease Neg Hx   .  Lesch-Nyhan syndrome Neg Hx   . Leukemia Neg Hx   . Lichen planus Neg Hx   . Li-Fraumeni syndrome Neg Hx   . Liver cancer Neg Hx   . Liver disease Neg Hx   . Long QT syndrome Neg Hx   . Lumbar disc disease Neg Hx   . Lung cancer Neg Hx   . Lung disease Neg Hx   . Lupus Neg Hx   . Lymphoma Neg Hx   . Macrocephaly Neg Hx   . Macrosomia Neg Hx   . Macular degeneration Neg Hx   . Malignant hypertension Neg Hx   . Malignant hyperthermia Neg Hx   . Maple syrup urine disease Neg Hx   . Marfan syndrome Neg Hx   . Mastocytosis Neg Hx   . Melanoma Neg Hx   . Memory loss Neg Hx   . Meniere's disease Neg Hx   . Menstrual problems Neg Hx   . Mental illness Neg Hx   . Mental retardation Neg Hx   . Metabolic syndrome Neg Hx   . Microcephaly Neg Hx   . Migraines Neg Hx   . Milk intolerance Neg Hx   . Miscarriages / Stillbirths Neg Hx   . Mitochondrial disorder Neg Hx   . Mitral valve prolapse Neg Hx   . Motor neuron disease Neg Hx   . Movement disorder Neg Hx   . Moyamoya disease Neg Hx   . Multiple births Neg Hx   . Multiple endocrine neoplasia Neg Hx   . Multiple fractures Neg Hx   . Multiple myeloma Neg Hx   . Multiple sclerosis Neg Hx   . Muscle cancer Neg Hx   . Muscular dystrophy Neg Hx   . Myasthenia gravis Neg Hx   .  Myelodysplastic syndrome Neg Hx   . Myocarditis Neg Hx   . Myoclonus Neg Hx   . Myopathy Neg Hx   . Nail disease Neg Hx   . Narcolepsy Neg Hx   . Nephritis Neg Hx   . Nephrolithiasis Neg Hx   . Nephrotic syndrome Neg Hx   . Neural tube defect Neg Hx   . Neurodegenerative disease Neg Hx   . Neurofibromatosis Neg Hx   . Neuromuscular disorder Neg Hx   . Neuropathy Neg Hx   . Neutropenia Neg Hx   . Nevi Neg Hx   . Niemann-Pick disease Neg Hx   . Night blindness Neg Hx   . Nocturnal enuresis Neg Hx   . Nystagmus Neg Hx   . Obesity Neg Hx   . OCD Neg Hx   . ODD Neg Hx   . Orchitis Neg Hx   . Osler-Weber-Rendu syndrome Neg Hx   . Osteoarthritis Neg  Hx   . Osteochondroma Neg Hx   . Osteogenesis imperfecta Neg Hx   . Osteopenia Neg Hx   . Osteoporosis Neg Hx   . Osteosarcoma Neg Hx   . Osteosclerosis Neg Hx   . Other Neg Hx   . Otitis media Neg Hx   . Ovarian cancer Neg Hx   . Ovarian cysts Neg Hx   . Oxalosis Neg Hx   . Paget's disease of bone Neg Hx   . Pancreatic cancer Neg Hx   . Pancreatitis Neg Hx   . Panhypopituitarism Neg Hx   . Panic disorder Neg Hx   . Paranoid behavior Neg Hx   . Parasomnia Neg Hx   . Parkinsonism Neg Hx   . Patau's syndrome Neg Hx   . Pathological gambling Neg Hx   . PDD Neg Hx   . Pectus carinatum Neg Hx   . Pectus excavatum Neg Hx   . Pelvic inflammatory disease Neg Hx   . Pemphigus vulgaris Neg Hx   . Penile cancer Neg Hx   . Periodic limb movement Neg Hx   . Peripheral vascular disease Neg Hx   . Pernicious anemia Neg Hx   . Personality disorder Neg Hx   . Pes cavus Neg Hx   . Physical abuse Neg Hx   . Polo Riley sequence Neg Hx   . PKU Neg Hx   . Plantar fasciitis Neg Hx   . Pleurisy Neg Hx   . Pneumonia Neg Hx   . Polychondritis Neg Hx   . Polycystic kidney disease Neg Hx   . Polycystic ovary syndrome Neg Hx   . Polycythemia Neg Hx   . Polymyalgia rheumatica Neg Hx   . Polymyositis Neg Hx   . Pompe disease Neg Hx   . Positive PPD/TB Exposure Neg Hx   . Posterior urethral valves Neg Hx   . Post-traumatic stress disorder Neg Hx   . Prader-Willi syndrome Neg Hx   . Premature birth Neg Hx   . Premature ovarian failure Neg Hx   . Preterm labor Neg Hx   . Prolactinoma Neg Hx   . Prostate cancer Neg Hx   . Prostatitis Neg Hx   . Protein C deficiency Neg Hx   . Protein S deficiency Neg Hx   . Proteinuria Neg Hx   . Prune belly syndrome Neg Hx   . Pruritis Neg Hx   . Pseudochol deficiency Neg Hx   . Pseudotumor cerebri Neg Hx   . Psoriasis Neg Hx   .  Psychosis Neg Hx   . Ptosis Neg Hx   . Pulmonary embolism Neg Hx   . Pulmonary fibrosis Neg Hx   . Pyelonephritis Neg  Hx   . Pyloric stenosis Neg Hx   . Pyruvate dehydrogenase deficiency Neg Hx   . Rashes / Skin problems Neg Hx   . Raynaud syndrome Neg Hx   . Rectal cancer Neg Hx   . Recurrent abdominal pain Neg Hx   . Reiter's syndrome Neg Hx   . Renal tubular acidosis Neg Hx   . Restless legs syndrome Neg Hx   . Retinal degeneration Neg Hx   . Retinal detachment Neg Hx   . Retinitis pigmentosa Neg Hx   . Retinoblastoma Neg Hx   . Retinopathy of prematurity Neg Hx   . Reye's syndrome Neg Hx   . Rheum arthritis Neg Hx   . Rheumatic fever Neg Hx   . Rheumatologic disease Neg Hx   . Rickets Neg Hx   . Rosacea Neg Hx   . Sacroiliitis Neg Hx   . Sarcoidosis Neg Hx   . Schizophrenia Neg Hx   . Scleritis Neg Hx   . Scleroderma Neg Hx   . Scoliosis Neg Hx   . Seasonal affective disorder Neg Hx   . Seizures Neg Hx   . Selective mutism Neg Hx   . Sensorineural hearing loss Neg Hx   . Severe combined immunodeficiency Neg Hx   . Severe sprains Neg Hx   . Sexual abuse Neg Hx   . Short stature Neg Hx   . Sick sinus syndrome Neg Hx   . Sickle cell anemia Neg Hx   . Sickle cell trait Neg Hx   . SIDS Neg Hx   . Single kidney Neg Hx   . Sinusitis Neg Hx   . Sjogren's syndrome Neg Hx   . Skeletal dysplasia Neg Hx   . Skin cancer Neg Hx   . Skin telangiectasia Neg Hx   . Sleep apnea Neg Hx   . Sleep disorder Neg Hx   . Sleep walking Neg Hx   . Snoring Neg Hx   . Social phobia Neg Hx   . Speech disorder Neg Hx   . Spina bifida Neg Hx   . Spinal muscular atrophy Neg Hx   . Splenomegaly Neg Hx   . Spondyloarthropathy Neg Hx   . Spondylolisthesis Neg Hx   . Spondylolysis Neg Hx   . Squamous cell carcinoma Neg Hx   . Stevens-Johnson syndrome Neg Hx   . Stickler syndrome Neg Hx   . Stomach cancer Neg Hx   . Strabismus Neg Hx   . Stroke Neg Hx   . Stuttering Neg Hx   . Subarachnoid hemorrhage Neg Hx   . Sudden death Neg Hx   . Suicidality Neg Hx   . Supraventricular tachycardia Neg Hx   .  Swallowing difficulties Neg Hx   . Tall stature Neg Hx   . Tay-Sachs disease Neg Hx   . Testicular cancer Neg Hx   . Thalassemia Neg Hx   . Throat cancer Neg Hx   . Thrombocytopenia Neg Hx   . Thrombophilia Neg Hx   . Thrombophlebitis Neg Hx   . Thrombosis Neg Hx   . Thymic aplasia Neg Hx   . Thyroid cancer Neg Hx   . Thyroid disease Neg Hx   . Thyroid nodules Neg Hx   . Tics Neg Hx   . Tongue cancer Neg Hx   .  Torticollis Neg Hx   . Tourette syndrome Neg Hx   . Tracheal cancer Neg Hx   . Tracheoesophageal fistual Neg Hx   . Tracheomalacia Neg Hx   . Transient ischemic attack Neg Hx   . Treacher Collins syndrome Neg Hx   . Tremor Neg Hx   . Tuberculosis Neg Hx   . Tuberous sclerosis Neg Hx   . Turner syndrome Neg Hx   . Ulcerative colitis Neg Hx   . Ulcers Neg Hx   . Undescended testes Neg Hx   . Unexplained death Neg Hx   . Urinary tract infection Neg Hx   . Urolithiasis Neg Hx   . Urticaria Neg Hx   . Uterine cancer Neg Hx   . Uveitis Neg Hx   . Vaginal cancer Neg Hx   . Valvular heart disease Neg Hx   . Vasculitis Neg Hx   . Velocardiofacial syndrome Neg Hx   . Venous thrombosis Neg Hx   . Verruca vulgaris  Neg Hx   . Vesicoureteral reflux Neg Hx   . Vision loss Neg Hx   . Vitamin D deficiency Neg Hx   . Vitiligo Neg Hx   . Voice disorder Neg Hx   . Von Gierke's disease Neg Hx   . Von Hippel-Lindau syndrome Neg Hx   . Von Willebrand disease Neg Hx   . Werdnig Hoffman Disease Neg Hx   . Williams syndrome Neg Hx   . Wilm's tumor Neg Hx   . Wilson's disease Neg Hx   . Wiskott-Aldrich syndrome Neg Hx   . Yves Dill Parkinson White syndrome Neg Hx   . Xeroderma pigmentosa Neg Hx   . Heart attack Mother   . Heart attack Father    History  Substance Use Topics  . Smoking status: Former Research scientist (life sciences)  . Smokeless tobacco: Never Used  . Alcohol Use: No   OB History   Grav Para Term Preterm Abortions TAB SAB Ect Mult Living                 Review of Systems   Constitutional: Positive for fever. Negative for fatigue.  HENT: Negative for congestion and drooling.   Eyes: Negative for pain.  Respiratory: Positive for cough. Negative for shortness of breath.   Cardiovascular: Negative for chest pain.  Gastrointestinal: Positive for nausea and vomiting. Negative for abdominal pain and diarrhea.  Genitourinary: Negative for dysuria and hematuria.  Musculoskeletal: Negative for back pain, gait problem and neck pain.  Skin: Negative for color change.  Neurological: Negative for dizziness and headaches.  Hematological: Negative for adenopathy.  Psychiatric/Behavioral: Negative for behavioral problems.  All other systems reviewed and are negative.     Allergies  Morphine and related; Tramadol; and Penicillins  Home Medications   Prior to Admission medications   Medication Sig Start Date End Date Taking? Authorizing Provider  acetaminophen (TYLENOL) 500 MG chewable tablet Chew 1,000 mg by mouth every 6 (six) hours as needed for pain.   Yes Historical Provider, MD  alendronate (FOSAMAX) 70 MG tablet Take 70 mg by mouth once a week. Take on Monday.Take with a full glass of water on an empty stomach.   Yes Historical Provider, MD  aspirin EC 81 MG tablet Take 81 mg by mouth daily.   Yes Historical Provider, MD  bisoprolol-hydrochlorothiazide (ZIAC) 5-6.25 MG per tablet Take 1 tablet by mouth daily.   Yes Historical Provider, MD  diazepam (VALIUM) 5 MG tablet Take 1 tablet (5 mg total)  by mouth at bedtime. 01/18/13  Yes Otelia Limes Carnaghi, PA-C  diltiazem (CARDIZEM CD) 240 MG 24 hr capsule Take 240 mg by mouth daily. 11/08/12  Yes Darlin Coco, MD  folic acid (FOLVITE) 1 MG tablet Take 1 mg by mouth daily.   Yes Historical Provider, MD  levothyroxine (SYNTHROID, LEVOTHROID) 50 MCG tablet Take 50-100 mcg by mouth See admin instructions. Take 2 tablets (100 mcg) on Monday and Friday, take 1 tablet (50 mcg) on Sunday, Tuesday, Wednesday, Thursday,  Saturday 11/12/11  Yes Darlin Coco, MD  methotrexate (RHEUMATREX) 2.5 MG tablet Take 10 mg by mouth 2 (two) times a week. On Tuesdays and Wednesdays 01/18/14  Yes Historical Provider, MD  predniSONE (DELTASONE) 5 MG tablet Take 5 mg by mouth daily.   Yes Historical Provider, MD  oxyCODONE-acetaminophen (PERCOCET/ROXICET) 5-325 MG per tablet Take 1 tablet by mouth every 6 (six) hours as needed for moderate pain. 03/11/14   Eustace Moore, MD   BP 108/44  Pulse 71  Temp(Src) 103.3 F (39.6 C) (Rectal)  Resp 21  SpO2 95% Physical Exam  Nursing note and vitals reviewed. Constitutional: She is oriented to person, place, and time. She appears well-developed and well-nourished.  HENT:  Head: Normocephalic.  Mouth/Throat: Oropharynx is clear and moist. No oropharyngeal exudate.  Eyes: Conjunctivae and EOM are normal. Pupils are equal, round, and reactive to light.  Neck: Normal range of motion. Neck supple.  Cardiovascular: Normal rate, regular rhythm, normal heart sounds and intact distal pulses.  Exam reveals no gallop and no friction rub.   No murmur heard. Pulmonary/Chest: Effort normal and breath sounds normal. No respiratory distress. She has no wheezes.  Abdominal: Soft. Bowel sounds are normal. There is no tenderness. There is no rebound and no guarding.  Musculoskeletal: Normal range of motion. She exhibits no edema and no tenderness.  Postoperative incision on the lumbar area of her back has mild surrounding erythema and straw-colored serous drainage.  Neurological: She is alert and oriented to person, place, and time.  Normal strength and sensation in bilateral lower extremities.  Skin: Skin is warm and dry.  Psychiatric: She has a normal mood and affect. Her behavior is normal.    ED Course  Procedures (including critical care time) Labs Review Labs Reviewed  CBC WITH DIFFERENTIAL - Abnormal; Notable for the following:    WBC 16.5 (*)    RBC 3.67 (*)    MCV 104.9 (*)     MCH 34.3 (*)    Neutrophils Relative % 85 (*)    Neutro Abs 14.0 (*)    Lymphocytes Relative 10 (*)    All other components within normal limits  COMPREHENSIVE METABOLIC PANEL - Abnormal; Notable for the following:    Potassium 3.4 (*)    Glucose, Bld 135 (*)    Albumin 3.2 (*)    AST 65 (*)    GFR calc non Af Amer 75 (*)    GFR calc Af Amer 87 (*)    All other components within normal limits  URINALYSIS, ROUTINE W REFLEX MICROSCOPIC - Abnormal; Notable for the following:    APPearance TURBID (*)    Hgb urine dipstick TRACE (*)    Protein, ur 30 (*)    Leukocytes, UA LARGE (*)    All other components within normal limits  URINE MICROSCOPIC-ADD ON - Abnormal; Notable for the following:    Squamous Epithelial / LPF MANY (*)    Bacteria, UA MANY (*)    All other  components within normal limits  BASIC METABOLIC PANEL - Abnormal; Notable for the following:    Potassium 3.5 (*)    GFR calc non Af Amer 79 (*)    All other components within normal limits  CBC - Abnormal; Notable for the following:    WBC 15.2 (*)    RBC 3.31 (*)    Hemoglobin 11.4 (*)    HCT 35.3 (*)    MCV 106.6 (*)    MCH 34.4 (*)    All other components within normal limits  URINE CULTURE  CULTURE, BLOOD (ROUTINE X 2)  CULTURE, BLOOD (ROUTINE X 2)  LACTIC ACID, PLASMA    Imaging Review Dg Chest 2 View  03/20/2014   CLINICAL DATA:  Productive cough.  EXAM: CHEST  2 VIEW  COMPARISON:  07/26/2012  FINDINGS: Heart size and pulmonary vascularity are normal. There is chronic or recurrent peribronchial thickening. The lungs are hyperinflated with an appearance suggestive of emphysema. No acute osseous abnormality.  IMPRESSION: Emphysema.  Recurrent or chronic bronchitic changes.   Electronically Signed   By: Rozetta Nunnery [name]   On: 03/20/2014 21:26   Ct Angio Chest Pe W/cm &/or Wo Cm  03/21/2014   CLINICAL DATA:  Shortness of breath after laminectomy.  EXAM: CT ANGIOGRAPHY CHEST WITH CONTRAST  TECHNIQUE:  Multidetector CT imaging of the chest was performed using the standard protocol during bolus administration of intravenous contrast. Multiplanar CT image reconstructions and MIPs were obtained to evaluate the vascular anatomy.  CONTRAST:  123mL OMNIPAQUE IOHEXOL 350 MG/ML SOLN  COMPARISON:  07/26/2012  FINDINGS: Technically adequate study with good opacification of the central and segmental pulmonary arteries. No focal filling defects are identified. No evidence of significant pulmonary embolus.  Normal heart size. Normal caliber thoracic aorta. Calcification in the coronary arteries, aortic valve, and aorta. No significant lymphadenopathy in the chest. Esophagus is decompressed. Multiple nodular enlargement of the thyroid gland, similar to prior study. Atelectasis or consolidation in the right lung base. Diffuse emphysematous changes in the lungs. No pneumothorax. No pleural effusions. Visualized portions of the upper abdominal organs are grossly unremarkable. Degenerative changes in the thoracic spine. Old fracture deformity of the right shoulder.  Review of the MIP images confirms the above findings.  IMPRESSION: No evidence of significant pulmonary embolus. Atelectasis or consolidation in the right lung base.   Electronically Signed   By: Lucienne Capers [name]   On: 03/21/2014 03:05   Mr Lumbar Spine W Wo Contrast  03/20/2014   CLINICAL DATA:  fever, low back pain, recent surgery of lumbar  EXAM: MRI LUMBAR SPINE WITHOUT AND WITH CONTRAST  TECHNIQUE: Multiplanar and multiecho pulse sequences of the lumbar spine were obtained without and with intravenous contrast.  CONTRAST:  74mL MULTIHANCE GADOBENATE DIMEGLUMINE 529 MG/ML IV SOLN  COMPARISON:  Prior MRI from 03/03/2014.  FINDINGS: For the purposes of this dictation, the lowest well-formed intervertebral disc spaces presumed to be the L5-S1 level, and there presumed to be 5 lumbar type vertebral bodies.  Vertebral bodies are normally aligned with preservation  of the normal lumbar lordosis. Vertebral body heights are preserved. No acute fracture or or listhesis. Signal intensity within the vertebral body bone marrow is within normal limits. No evidence of osteomyelitis discitis.  Signal intensity within the visualized spinal cord is within normal limits. Conus medullaris terminates at the L1 level.  Postsurgical changes from interval decompressive laminectomy and facetectomy at L3-4, L4-5, and L5-S1 is seen. Heterogeneous signal intensity seen within the paraspinous  soft tissues at the laminectomy site, compatible with postoperative changes. A loculated fluid collection measuring approximately 6.3 x 5.6 x 2.4 cm seen along the midline incision (series 10, image 10). Mild postcontrast enhancement seen about this collection. While this finding may represent a postoperative seroma, possible infection is not excluded. This collection abuts the dorsal aspect of the thecal sac at the L3 and L4 levels along the laminectomy site. No epidural abscess.  L1-2: Shallow central disc protrusion again noted without significant stenosis.  L2-3: Mild facet hypertrophy again noted bilaterally. There is mild diffuse degenerative disc bulge. No significant stenosis.  L3-4: Postoperative changes from prior decompressive laminectomy and medial facetectomy present. Broad-based disc bulging. No previously seen bilateral lateral recess stenosis is slightly improved. Mild bilateral foraminal narrowing is stable.  L4-5: Sequelae of postoperative decompressive laminectomy and medial facetectomy. Previously seen severe central canal stenosis is markedly improved. Moderate bilateral foraminal narrowing also improved, 0. There is persistent mild bilateral foraminal stenosis at this level.  L5-S1: Postoperative changes from decompressive laminectomy present. Broad-based degenerative disc bulging again seen. Previously seen moderate central canal stenosis is improved as is bilateral lateral recess  narrowing. Mild bilateral foraminal stenosis is stable.  Visualized visceral structures are unremarkable.  IMPRESSION: 1. Postoperative changes from interval decompressive laminectomy and medial facetectomy at L3-4, L4-5, and L5-S1. 2. Loculated fluid collection measuring approximately 6.3 x 5.6 x 2.4 cm along the midline incision with mild peripheral post-contrast enhancement. While this finding may reflect postoperative seroma, superimposed infection is not excluded. Clinical correlation recommended. No epidural abscess or evidence of osteomyelitis discitis seen elsewhere within the lumbar spine. 3. Interval improvement in central canal stenosis at L3 through S1 status post spinal decompression. 4. Additional degenerative changes as above.   Electronically Signed   By: Jeannine Boga [name]   On: 03/20/2014 22:46     EKG Interpretation   Date/Time:  Wednesday March 20 2014 19:46:35 EDT Ventricular Rate:  75 PR Interval:  171 QRS Duration: 76 QT Interval:  366 QTC Calculation: 409 R Axis:   36 Text Interpretation:  Sinus rhythm Low voltage, precordial leads No  significant change since last tracing Confirmed by HARRISON  MD, FORREST  ([id number]) on 03/21/2014 12:22:29 AM      MDM   Final diagnoses:  Other specified fever  UTI (lower urinary tract infection)  Hypoxia  Leukocytosis  S/P lumbar laminectomy  Sepsis, due to unspecified organism    9:01 PM 80 y.o. female s/p lumbar laminectomy on 03/06/14 who presents with fever and vomiting which began today. The patient states she saw her neurosurgeon yesterday and felt that things were going well. Today she developed a fever and vomiting. She notes that she has ongoing back pain since the surgery but this is unchanged. There is some mild erythema around the surgical wound with some serous drainage but no evidence of abscess. The patient also developed a cough today productive of yellow sputum. She is febrile here and mildly hypoxic with an O2  sat of 89% on room air. Will get screening labs and imaging.  Will tx UTI w/ rocephin. Pt is hypoxic w/ non-contrib CXR. Case discussed w/ on call physician for [place]. He will see pt. Plan to order CTA to r/o PE.    Blanchard Kelch, MD 03/21/14 [phone]

## 2014-03-20 NOTE — ED Notes (Signed)
Patient transported to X-ray 

## 2014-03-21 ENCOUNTER — Encounter (HOSPITAL_COMMUNITY): Payer: Medicare HMO | Admitting: Anesthesiology

## 2014-03-21 ENCOUNTER — Inpatient Hospital Stay (HOSPITAL_COMMUNITY): Payer: Medicare HMO | Admitting: Anesthesiology

## 2014-03-21 ENCOUNTER — Encounter (HOSPITAL_COMMUNITY)
Admission: EM | Disposition: A | Discharge status: Home Health | Payer: Self-pay | Source: Home / Self Care | Attending: Neurological Surgery

## 2014-03-21 ENCOUNTER — Encounter (HOSPITAL_COMMUNITY): Payer: Self-pay | Admitting: Internal Medicine

## 2014-03-21 ENCOUNTER — Inpatient Hospital Stay (HOSPITAL_COMMUNITY): Payer: Medicare HMO

## 2014-03-21 DIAGNOSIS — R0902 Hypoxemia: Secondary | ICD-10-CM | POA: Diagnosis present

## 2014-03-21 DIAGNOSIS — D72829 Elevated white blood cell count, unspecified: Secondary | ICD-10-CM | POA: Diagnosis present

## 2014-03-21 DIAGNOSIS — N390 Urinary tract infection, site not specified: Secondary | ICD-10-CM | POA: Diagnosis present

## 2014-03-21 DIAGNOSIS — R509 Fever, unspecified: Secondary | ICD-10-CM | POA: Diagnosis present

## 2014-03-21 DIAGNOSIS — T8149XA Infection following a procedure, other surgical site, initial encounter: Secondary | ICD-10-CM

## 2014-03-21 DIAGNOSIS — Z9889 Other specified postprocedural states: Secondary | ICD-10-CM

## 2014-03-21 HISTORY — PX: LUMBAR WOUND DEBRIDEMENT: SHX1988

## 2014-03-21 LAB — URINE CULTURE: Colony Count: >=100000

## 2014-03-21 LAB — BASIC METABOLIC PANEL
BUN: 16 mg/dL (ref 6–23)
CO2: 27 mEq/L (ref 19–32)
CREATININE: 0.67 mg/dL (ref 0.50–1.10)
Calcium: 9.2 mg/dL (ref 8.4–10.5)
Chloride: 102 mEq/L (ref 96–112)
GFR, EST NON AFRICAN AMERICAN: 79 mL/min — AB (ref >90)
Glucose, Bld: 92 mg/dL (ref 70–99)
POTASSIUM: 3.5 meq/L — AB (ref 3.7–5.3)
Sodium: 140 mEq/L (ref 137–147)

## 2014-03-21 LAB — CBC
HCT: 35.3 % — ABNORMAL LOW (ref 36.0–46.0)
Hemoglobin: 11.4 g/dL — ABNORMAL LOW (ref 12.0–15.0)
MCH: 34.4 pg — ABNORMAL HIGH (ref 26.0–34.0)
MCHC: 32.3 g/dL (ref 30.0–36.0)
MCV: 106.6 fL — ABNORMAL HIGH (ref 78.0–100.0)
Platelets: 225 10*3/uL (ref 150–400)
RBC: 3.31 MIL/uL — ABNORMAL LOW (ref 3.87–5.11)
RDW: 14.5 % (ref 11.5–15.5)
WBC: 15.2 10*3/uL — ABNORMAL HIGH (ref 4.0–10.5)

## 2014-03-21 SURGERY — LUMBAR WOUND DEBRIDEMENT
Anesthesia: General | Site: Back

## 2014-03-21 MED ORDER — LEVOTHYROXINE SODIUM 50 MCG PO TABS: 50.0000 ug | ORAL_TABLET | ORAL | Status: DC

## 2014-03-21 MED ORDER — DIAZEPAM 5 MG PO TABS
5.0000 mg | ORAL_TABLET | Freq: Every day | ORAL | Status: DC
  Administered 2014-03-21: 5 mg via ORAL
  Filled 2014-03-21: qty 1

## 2014-03-21 MED ORDER — PROPOFOL 10 MG/ML IV BOLUS
INTRAVENOUS | Status: AC
  Filled 2014-03-21: qty 20

## 2014-03-21 MED ORDER — 0.9 % SODIUM CHLORIDE (POUR BTL) OPTIME
TOPICAL | Status: DC | PRN
  Administered 2014-03-21: 1000 mL

## 2014-03-21 MED ORDER — MENTHOL 3 MG MT LOZG: 1.0000 | LOZENGE | OROMUCOSAL | Status: DC | PRN

## 2014-03-21 MED ORDER — ONDANSETRON HCL 4 MG PO TABS: 4.0000 mg | ORAL_TABLET | Freq: Four times a day (QID) | ORAL | Status: DC | PRN

## 2014-03-21 MED ORDER — CEFTRIAXONE SODIUM 1 G IJ SOLR
1.0000 g | Freq: Every day | INTRAMUSCULAR | Status: DC
  Filled 2014-03-21: qty 10

## 2014-03-21 MED ORDER — ACETAMINOPHEN 650 MG RE SUPP
650.0000 mg | Freq: Four times a day (QID) | RECTAL | Status: DC | PRN
  Administered 2014-03-21: 650 mg via RECTAL
  Filled 2014-03-21: qty 1

## 2014-03-21 MED ORDER — VANCOMYCIN HCL 1000 MG IV SOLR
INTRAVENOUS | Status: DC | PRN
  Administered 2014-03-21: 1000 mg

## 2014-03-21 MED ORDER — SODIUM CHLORIDE 0.9 % IJ SOLN: 3.0000 mL | INTRAMUSCULAR | Status: DC | PRN

## 2014-03-21 MED ORDER — DEXTROSE 5 % IV SOLN
1.0000 g | Freq: Two times a day (BID) | INTRAVENOUS | Status: DC
  Filled 2014-03-21: qty 1

## 2014-03-21 MED ORDER — VANCOMYCIN HCL 1000 MG IV SOLR
INTRAVENOUS | Status: AC
  Filled 2014-03-21: qty 1000

## 2014-03-21 MED ORDER — VANCOMYCIN HCL 500 MG IV SOLR
500.0000 mg | Freq: Two times a day (BID) | INTRAVENOUS | Status: DC
  Filled 2014-03-21: qty 500

## 2014-03-21 MED ORDER — POTASSIUM CHLORIDE IN NACL 20-0.9 MEQ/L-% IV SOLN
INTRAVENOUS | Status: DC
  Administered 2014-03-21 – 2014-03-26 (×8): via INTRAVENOUS
  Filled 2014-03-21 (×12): qty 1000

## 2014-03-21 MED ORDER — FENTANYL CITRATE 0.05 MG/ML IJ SOLN
INTRAMUSCULAR | Status: AC
  Filled 2014-03-21: qty 5

## 2014-03-21 MED ORDER — SODIUM CHLORIDE 0.9 % IV SOLN: 1000.0000 mL | Freq: Once | INTRAVENOUS | Status: DC

## 2014-03-21 MED ORDER — MIDAZOLAM HCL 2 MG/2ML IJ SOLN
INTRAMUSCULAR | Status: AC
  Filled 2014-03-21: qty 2

## 2014-03-21 MED ORDER — ALBUMIN HUMAN 5 % IV SOLN
12.5000 g | Freq: Once | INTRAVENOUS | Status: AC
  Administered 2014-03-21: 12.5 g via INTRAVENOUS

## 2014-03-21 MED ORDER — FENTANYL CITRATE 0.05 MG/ML IJ SOLN
INTRAMUSCULAR | Status: DC | PRN
  Administered 2014-03-21: 50 ug via INTRAVENOUS

## 2014-03-21 MED ORDER — ROCURONIUM BROMIDE 100 MG/10ML IV SOLN
INTRAVENOUS | Status: DC | PRN
  Administered 2014-03-21: 25 mg via INTRAVENOUS

## 2014-03-21 MED ORDER — VANCOMYCIN HCL 500 MG IV SOLR
500.0000 mg | Freq: Two times a day (BID) | INTRAVENOUS | Status: DC
  Filled 2014-03-21 (×2): qty 500

## 2014-03-21 MED ORDER — HYDROCODONE-ACETAMINOPHEN 5-325 MG PO TABS: 1.0000 | ORAL_TABLET | ORAL | Status: DC | PRN

## 2014-03-21 MED ORDER — NEOSTIGMINE METHYLSULFATE 10 MG/10ML IV SOLN
INTRAVENOUS | Status: DC | PRN
  Administered 2014-03-21: 2 mg via INTRAVENOUS

## 2014-03-21 MED ORDER — ONDANSETRON HCL 4 MG/2ML IJ SOLN: 4.0000 mg | Freq: Once | INTRAMUSCULAR | Status: DC

## 2014-03-21 MED ORDER — ACETAMINOPHEN 10 MG/ML IV SOLN
INTRAVENOUS | Status: AC
Start: 2014-03-21 — End: 2014-03-21
  Administered 2014-03-21: 1000 mg
  Filled 2014-03-21: qty 100

## 2014-03-21 MED ORDER — SODIUM CHLORIDE 0.9 % IV SOLN: 250.0000 mL | INTRAVENOUS | Status: DC

## 2014-03-21 MED ORDER — ALUM & MAG HYDROXIDE-SIMETH 200-200-20 MG/5ML PO SUSP: 30.0000 mL | Freq: Four times a day (QID) | ORAL | Status: DC | PRN

## 2014-03-21 MED ORDER — GLYCOPYRROLATE 0.2 MG/ML IJ SOLN
INTRAMUSCULAR | Status: DC | PRN
  Administered 2014-03-21: 0.4 mg via INTRAVENOUS

## 2014-03-21 MED ORDER — BACITRACIN ZINC 500 UNIT/GM EX OINT
TOPICAL_OINTMENT | CUTANEOUS | Status: DC | PRN
  Administered 2014-03-21: 1 via TOPICAL

## 2014-03-21 MED ORDER — FENTANYL CITRATE 0.05 MG/ML IJ SOLN: 25.0000 ug | INTRAMUSCULAR | Status: DC | PRN

## 2014-03-21 MED ORDER — BISOPROLOL-HYDROCHLOROTHIAZIDE 5-6.25 MG PO TABS
1.0000 | ORAL_TABLET | Freq: Every day | ORAL | Status: DC
  Administered 2014-03-21 – 2014-03-24 (×3): 1 via ORAL
  Filled 2014-03-21 (×4): qty 1

## 2014-03-21 MED ORDER — DEXTROSE 5 % IV SOLN
1.0000 g | INTRAVENOUS | Status: DC
  Administered 2014-03-21 – 2014-03-25 (×5): 1 g via INTRAVENOUS
  Filled 2014-03-21 (×6): qty 10

## 2014-03-21 MED ORDER — ASPIRIN EC 81 MG PO TBEC
81.0000 mg | DELAYED_RELEASE_TABLET | Freq: Every day | ORAL | Status: DC
  Administered 2014-03-21 – 2014-03-27 (×7): 81 mg via ORAL
  Filled 2014-03-21 (×7): qty 1

## 2014-03-21 MED ORDER — DOCUSATE SODIUM 100 MG PO CAPS
100.0000 mg | ORAL_CAPSULE | Freq: Two times a day (BID) | ORAL | Status: DC
  Administered 2014-03-21 – 2014-03-27 (×12): 100 mg via ORAL
  Filled 2014-03-21 (×13): qty 1

## 2014-03-21 MED ORDER — OXYCODONE-ACETAMINOPHEN 5-325 MG PO TABS: 1.0000 | ORAL_TABLET | Freq: Four times a day (QID) | ORAL | Status: DC | PRN

## 2014-03-21 MED ORDER — LEVOTHYROXINE SODIUM 25 MCG PO TABS
50.0000 ug | ORAL_TABLET | ORAL | Status: DC
  Administered 2014-03-21: 50 ug via ORAL
  Filled 2014-03-21: qty 1
  Filled 2014-03-21: qty 2

## 2014-03-21 MED ORDER — HYDROMORPHONE HCL PF 1 MG/ML IJ SOLN: 0.5000 mg | INTRAMUSCULAR | Status: DC | PRN

## 2014-03-21 MED ORDER — VANCOMYCIN HCL IN DEXTROSE 1-5 GM/200ML-% IV SOLN
INTRAVENOUS | Status: AC
  Administered 2014-03-21: 1000 mg via INTRAVENOUS
  Filled 2014-03-21: qty 200

## 2014-03-21 MED ORDER — ONDANSETRON HCL 4 MG/2ML IJ SOLN: 4.0000 mg | INTRAMUSCULAR | Status: DC | PRN

## 2014-03-21 MED ORDER — ENOXAPARIN SODIUM 40 MG/0.4ML ~~LOC~~ SOLN
40.0000 mg | Freq: Every day | SUBCUTANEOUS | Status: DC
  Administered 2014-03-21: 40 mg via SUBCUTANEOUS
  Filled 2014-03-21: qty 0.4

## 2014-03-21 MED ORDER — LEVOTHYROXINE SODIUM 25 MCG PO TABS
50.0000 ug | ORAL_TABLET | ORAL | Status: DC
  Administered 2014-03-22: 50 ug via ORAL
  Filled 2014-03-21: qty 1

## 2014-03-21 MED ORDER — GLYCOPYRROLATE 0.2 MG/ML IJ SOLN
INTRAMUSCULAR | Status: AC
  Filled 2014-03-21: qty 2

## 2014-03-21 MED ORDER — IOHEXOL 350 MG/ML SOLN
100.0000 mL | Freq: Once | INTRAVENOUS | Status: AC | PRN
  Administered 2014-03-21: 100 mL via INTRAVENOUS

## 2014-03-21 MED ORDER — SODIUM CHLORIDE 0.9 % IR SOLN
Status: DC | PRN
  Administered 2014-03-21 (×2)

## 2014-03-21 MED ORDER — SODIUM CHLORIDE 0.9 % IV SOLN
INTRAVENOUS | Status: DC
  Administered 2014-03-21: 02:00:00 via INTRAVENOUS

## 2014-03-21 MED ORDER — ALBUMIN HUMAN 5 % IV SOLN
INTRAVENOUS | Status: AC
Start: 2014-03-21 — End: 2014-03-22
  Filled 2014-03-21: qty 250

## 2014-03-21 MED ORDER — PHENYLEPHRINE HCL 10 MG/ML IJ SOLN
10.0000 mg | INTRAVENOUS | Status: DC | PRN
  Administered 2014-03-21: 20 ug/min via INTRAVENOUS

## 2014-03-21 MED ORDER — LIDOCAINE HCL (CARDIAC) 20 MG/ML IV SOLN
INTRAVENOUS | Status: DC | PRN
  Administered 2014-03-21: 20 mg via INTRAVENOUS

## 2014-03-21 MED ORDER — LACTATED RINGERS IV SOLN
INTRAVENOUS | Status: DC | PRN
  Administered 2014-03-21: 18:00:00 via INTRAVENOUS

## 2014-03-21 MED ORDER — PHENOL 1.4 % MT LIQD: 1.0000 | OROMUCOSAL | Status: DC | PRN

## 2014-03-21 MED ORDER — ONDANSETRON HCL 4 MG/2ML IJ SOLN: 4.0000 mg | Freq: Four times a day (QID) | INTRAMUSCULAR | Status: DC | PRN

## 2014-03-21 MED ORDER — FOLIC ACID 1 MG PO TABS
1.0000 mg | ORAL_TABLET | Freq: Every day | ORAL | Status: DC
  Administered 2014-03-21 – 2014-03-27 (×7): 1 mg via ORAL
  Filled 2014-03-21 (×7): qty 1

## 2014-03-21 MED ORDER — ARTIFICIAL TEARS OP OINT
TOPICAL_OINTMENT | OPHTHALMIC | Status: DC | PRN
  Administered 2014-03-21: 1 via OPHTHALMIC

## 2014-03-21 MED ORDER — DILTIAZEM HCL ER COATED BEADS 240 MG PO CP24
240.0000 mg | ORAL_CAPSULE | Freq: Every day | ORAL | Status: DC
  Administered 2014-03-21 – 2014-03-27 (×7): 240 mg via ORAL
  Filled 2014-03-21 (×7): qty 1

## 2014-03-21 MED ORDER — NEOSTIGMINE METHYLSULFATE 10 MG/10ML IV SOLN
INTRAVENOUS | Status: AC
  Filled 2014-03-21: qty 1

## 2014-03-21 MED ORDER — PROPOFOL 10 MG/ML IV BOLUS
INTRAVENOUS | Status: DC | PRN
  Administered 2014-03-21: 70 mg via INTRAVENOUS

## 2014-03-21 MED ORDER — ONDANSETRON HCL 4 MG/2ML IJ SOLN
INTRAMUSCULAR | Status: DC | PRN
  Administered 2014-03-21: 4 mg via INTRAVENOUS

## 2014-03-21 MED ORDER — ONDANSETRON HCL 4 MG/2ML IJ SOLN
INTRAMUSCULAR | Status: AC
  Filled 2014-03-21: qty 2

## 2014-03-21 MED ORDER — ACETAMINOPHEN 325 MG PO TABS
650.0000 mg | ORAL_TABLET | Freq: Four times a day (QID) | ORAL | Status: DC | PRN
  Administered 2014-03-22: 650 mg via ORAL

## 2014-03-21 MED ORDER — SODIUM CHLORIDE 0.9 % IJ SOLN
3.0000 mL | Freq: Two times a day (BID) | INTRAMUSCULAR | Status: DC
  Administered 2014-03-21 – 2014-03-23 (×4): 3 mL via INTRAVENOUS
  Administered 2014-03-24: 11:00:00 via INTRAVENOUS
  Administered 2014-03-25 – 2014-03-26 (×2): 3 mL via INTRAVENOUS

## 2014-03-21 MED ORDER — PREDNISONE 5 MG PO TABS
5.0000 mg | ORAL_TABLET | Freq: Every day | ORAL | Status: DC
  Administered 2014-03-21 – 2014-03-27 (×7): 5 mg via ORAL
  Filled 2014-03-21 (×8): qty 1

## 2014-03-21 SURGICAL SUPPLY — 46 items
BAG DECANTER FOR FLEXI CONT (MISCELLANEOUS) ×3 IMPLANT
BENZOIN TINCTURE PRP APPL 2/3 (GAUZE/BANDAGES/DRESSINGS) ×3 IMPLANT
BLADE 10 SAFETY STRL DISP (BLADE) IMPLANT
CANISTER SUCT 3000ML (MISCELLANEOUS) ×3 IMPLANT
CLOSURE WOUND 1/2 X4 (GAUZE/BANDAGES/DRESSINGS) ×1
DRAPE LAPAROTOMY 100X72X124 (DRAPES) ×3 IMPLANT
DRAPE POUCH INSTRU U-SHP 10X18 (DRAPES) ×3 IMPLANT
DRSG OPSITE 4X5.5 SM (GAUZE/BANDAGES/DRESSINGS) ×3 IMPLANT
DRSG OPSITE POSTOP 4X6 (GAUZE/BANDAGES/DRESSINGS) ×3 IMPLANT
DRSG TELFA 3X8 NADH (GAUZE/BANDAGES/DRESSINGS) ×3 IMPLANT
DURAPREP 26ML APPLICATOR (WOUND CARE) IMPLANT
DURAPREP 6ML APPLICATOR 50/CS (WOUND CARE) IMPLANT
ELECT REM PT RETURN 9FT ADLT (ELECTROSURGICAL) ×3
ELECTRODE REM PT RTRN 9FT ADLT (ELECTROSURGICAL) ×1 IMPLANT
GAUZE SPONGE 4X4 16PLY XRAY LF (GAUZE/BANDAGES/DRESSINGS) IMPLANT
GLOVE BIO SURGEON STRL SZ 6.5 (GLOVE) ×4 IMPLANT
GLOVE BIO SURGEON STRL SZ8 (GLOVE) ×3 IMPLANT
GLOVE BIO SURGEONS STRL SZ 6.5 (GLOVE) ×2
GLOVE BIOGEL PI IND STRL 7.0 (GLOVE) ×1 IMPLANT
GLOVE BIOGEL PI INDICATOR 7.0 (GLOVE) ×2
GOWN STRL REUS W/ TWL LRG LVL3 (GOWN DISPOSABLE) ×1 IMPLANT
GOWN STRL REUS W/ TWL XL LVL3 (GOWN DISPOSABLE) IMPLANT
GOWN STRL REUS W/TWL 2XL LVL3 (GOWN DISPOSABLE) ×3 IMPLANT
GOWN STRL REUS W/TWL LRG LVL3 (GOWN DISPOSABLE) ×2
GOWN STRL REUS W/TWL XL LVL3 (GOWN DISPOSABLE)
KIT BASIN OR (CUSTOM PROCEDURE TRAY) ×3 IMPLANT
KIT ROOM TURNOVER OR (KITS) ×3 IMPLANT
NEEDLE HYPO 18GX1.5 BLUNT FILL (NEEDLE) IMPLANT
NEEDLE HYPO 25X1 1.5 SAFETY (NEEDLE) IMPLANT
NEEDLE SPNL 20GX3.5 QUINCKE YW (NEEDLE) IMPLANT
NS IRRIG 1000ML POUR BTL (IV SOLUTION) ×3 IMPLANT
PACK LAMINECTOMY NEURO (CUSTOM PROCEDURE TRAY) ×3 IMPLANT
PAD ARMBOARD 7.5X6 YLW CONV (MISCELLANEOUS) ×9 IMPLANT
STAPLER VISISTAT 35W (STAPLE) ×3 IMPLANT
STRIP CLOSURE SKIN 1/2X4 (GAUZE/BANDAGES/DRESSINGS) ×2 IMPLANT
SUT VIC AB 0 CT1 18XCR BRD8 (SUTURE) ×2 IMPLANT
SUT VIC AB 0 CT1 8-18 (SUTURE) ×4
SUT VIC AB 2-0 CP2 18 (SUTURE) ×3 IMPLANT
SUT VIC AB 3-0 SH 8-18 (SUTURE) ×3 IMPLANT
SWAB CULTURE LIQ STUART DBL (MISCELLANEOUS) ×6 IMPLANT
SYR 20ML ECCENTRIC (SYRINGE) ×3 IMPLANT
SYR 3ML LL SCALE MARK (SYRINGE) IMPLANT
TOWEL OR 17X24 6PK STRL BLUE (TOWEL DISPOSABLE) ×3 IMPLANT
TOWEL OR 17X26 10 PK STRL BLUE (TOWEL DISPOSABLE) ×3 IMPLANT
TUBE ANAEROBIC SPECIMEN COL (MISCELLANEOUS) ×3 IMPLANT
WATER STERILE IRR 1000ML POUR (IV SOLUTION) IMPLANT

## 2014-03-21 NOTE — Evaluation (Signed)
Physical Therapy Evaluation Patient Details Name: Marissa Parsons MRN: [id number] DOB: 05-11-1929 Today's Date: 03/21/2014   History of Present Illness  Pt is an 80 y/o female admitted with nausea/vomiting and fever. Patient recently underwent a decompressive lumbar laminectomy on 6/7 by Dr. Marikay Alar. She tolerated the procedure well and was progressing very well at home, ambulating with a walker and able to do some minor household chores. The afternoon of admission she developed sudden onset of nausea and vomiting associated with fever stating she felt like she was "burning up". She had several episodes of emesis which prompted her to come to the emergency department. She was evaluated and found to have a temperature 103.3 with O2 sats in the 80's.   Clinical Impression  Pt admitted with the above. Pt currently with functional limitations due to the deficits listed below (see PT Problem List). At the time of PT eval pt was able to sit EOB but had very poor sitting balance. Further mobility deferred. As pt was doing well functionally PTA, keeping follow-up recommendations for HHPT. If pt does not improve, may want to consider SNF. Pt will benefit from skilled PT to increase their independence and safety with mobility to allow discharge to the venue listed below.      Follow Up Recommendations Home health PT;Supervision/Assistance - 24 hour    Equipment Recommendations  None recommended by PT    Recommendations for Other Services       Precautions / Restrictions Precautions Precautions: Back;Fall Precaution Comments: Pt unable to recall any of her back precautions. Reviewed 3/3 precautions and pt was unable to state them back to me.  Restrictions Weight Bearing Restrictions: No      Mobility  Bed Mobility Overal bed mobility: Needs Assistance Bed Mobility: Supine to Sit;Sit to Supine;Rolling Rolling: Min assist   Supine to sit: Min guard Sit to supine: Min assist   General bed  mobility comments: Assist to intiate tasks. Pt only able to sit EOB with poor sitting balance. Did not attempt standing as pt was confused and had decreased safety awareness. Was able to maintain static sitting with mod assist but only required min guard to achieve sitting EOB.   Transfers                    Ambulation/Gait                Stairs            Wheelchair Mobility    Modified Rankin (Stroke Patients Only)       Balance Overall balance assessment: Needs assistance Sitting-balance support: Feet supported;Bilateral upper extremity supported Sitting balance-Leahy Scale: Poor Sitting balance - Comments: Mod assist required to maintain sitting EOB.                                      Pertinent Vitals/Pain Vitals stable throughout session.     Home Living Family/patient expects to be discharged to:: Private residence Living Arrangements: Alone Available Help at Discharge: Family;Available PRN/intermittently;Friend(s) Type of Home: House Home Access: Stairs to enter Entrance Stairs-Rails: Can reach both Entrance Stairs-Number of Steps: 2 Home Layout: One level Home Equipment: Walker - 2 wheels;Shower seat Additional Comments: pt does not drive due to impaired vision    Prior Function Level of Independence: Independent with assistive device(s)         Comments: Pt reports she  was able to ambulate around the house and do some housework. Unable to elaborate on what kind of activities she was doing.      Hand Dominance   Dominant Hand: Right    Extremity/Trunk Assessment   Upper Extremity Assessment: Defer to OT evaluation           Lower Extremity Assessment: Generalized weakness      Cervical / Trunk Assessment: Kyphotic  Communication   Communication: No difficulties  Cognition Arousal/Alertness: Awake/alert Behavior During Therapy: WFL for tasks assessed/performed Overall Cognitive Status: Impaired/Different  from baseline Area of Impairment: Orientation;Attention;Memory;Following commands;Safety/judgement;Awareness;Problem solving Orientation Level: Disoriented to;Place;Time;Situation Current Attention Level: Focused Memory: Decreased recall of precautions;Decreased short-term memory Following Commands: Follows one step commands inconsistently;Follows one step commands with increased time Safety/Judgement: Decreased awareness of safety;Decreased awareness of deficits Awareness: Intellectual Problem Solving: Slow processing      General Comments      Exercises        Assessment/Plan    PT Assessment Patient needs continued PT services  PT Diagnosis Difficulty walking;Generalized weakness   PT Problem List Decreased strength;Decreased range of motion;Decreased activity tolerance;Decreased balance;Decreased mobility;Decreased knowledge of use of DME;Decreased safety awareness;Decreased knowledge of precautions  PT Treatment Interventions DME instruction;Gait training;Stair training;Functional mobility training;Therapeutic activities;Therapeutic exercise;Neuromuscular re-education;Patient/family education   PT Goals (Current goals can be found in the Care Plan section) Acute Rehab PT Goals Patient Stated Goal: to go home PT Goal Formulation: With patient Time For Goal Achievement: 04/04/14 Potential to Achieve Goals: Good    Frequency Min 5X/week   Barriers to discharge        Co-evaluation               End of Session   Activity Tolerance: Patient tolerated treatment well Patient left: in bed;with bed alarm set;with call bell/phone within reach Nurse Communication: Mobility status         Time: 1610-9604 PT Time Calculation (min): 15 min   Charges:   PT Evaluation $Initial PT Evaluation Tier I: 1 Procedure     PT G CodesRuthann Cancer 03/21/2014, 5:21 PM  Ruthann Cancer, PT, DPT Acute Rehabilitation Services Pager: [phone]

## 2014-03-21 NOTE — Progress Notes (Signed)
Pt arrived to 5w28, no c/o or distress at this time.  VSS.  Oriented to room and how to use call bell.  Will continue to monitor closely.

## 2014-03-21 NOTE — Anesthesia Postprocedure Evaluation (Signed)
  Anesthesia Post-op Note  Patient: Marissa Parsons  Procedure(s) Performed: Procedure(s): Incision and drainage of lumbar wound (N/A)  Patient Location: PACU  Anesthesia Type:General  Level of Consciousness: awake, alert  and oriented  Airway and Oxygen Therapy: Patient Spontanous Breathing and Patient connected to nasal cannula oxygen  Post-op Pain: mild  Post-op Assessment: Post-op Vital signs reviewed, Patient's Cardiovascular Status Stable, Respiratory Function Stable, Patent Airway and Pain level controlled  Post-op Vital Signs: stable  Last Vitals:  Filed Vitals:   03/21/14 2038  BP:   Pulse: 64  Temp: 37.2 C  Resp: 18    Complications: No apparent anesthesia complications

## 2014-03-21 NOTE — Progress Notes (Signed)
Physician Daily Progress Note  Subjective: No complaints today  No back pain    Objective: Vital signs in last 24 hours: Temp:  [97.6 F (36.4 C)-103.3 F (39.6 C)] 97.6 F (36.4 C) (06/25 0539) Pulse Rate:  [63-75] 63 (06/25 0539) Resp:  [19-47] 20 (06/25 0539) BP: (99-134)/(37-63) 103/58 mmHg (06/25 0539) SpO2:  [95 %-99 %] 99 % (06/25 0539) Weight:  [129 lb 10.1 oz (58.8 kg)] 129 lb 10.1 oz (58.8 kg) (06/25 0109) Weight change:  Last BM Date: 03/20/14  CBG (last 3)  No results found for this basename: GLUCAP,  in the last 72 hours  Intake/Output from previous day: No intake or output data in the 24 hours ending 03/21/14 0716    Physical Exam General appearance: WF in NAD  Eyes: no scleral icterus Throat: oropharynx moist without erythema Resp: CTAB, no wheezes, rales  Cardio: RRR, no MRG  GI: soft, non-tender; bowel sounds normal; no masses,  no organomegaly Extremities: no clubbing, cyanosis or edema   Lab Results:  Recent Labs  03/20/14 2000 03/21/14 0415  NA 139 140  K 3.4* 3.5*  CL 98 102  CO2 26 27  GLUCOSE 135* 92  BUN 22 16  CREATININE 0.79 0.67  CALCIUM 10.1 9.2     Recent Labs  03/20/14 2000  AST 65*  ALT 30  ALKPHOS 57  BILITOT 0.4  PROT 6.3  ALBUMIN 3.2*     Recent Labs  03/20/14 2000 03/21/14 0415  WBC 16.5* 15.2*  NEUTROABS 14.0*  --   HGB 12.6 11.4*  HCT 38.5 35.3*  MCV 104.9* 106.6*  PLT 261 225    Lab Results  Component Value Date   INR 1.02 03/04/2014   INR 1.06 01/16/2013   INR 2.4 01/03/2013    No results found for this basename: CKTOTAL, CKMB, CKMBINDEX, TROPONINI,  in the last 72 hours  No results found for this basename: TSH, T4TOTAL, FREET3, T3FREE, THYROIDAB,  in the last 72 hours  No results found for this basename: VITAMINB12, FOLATE, FERRITIN, TIBC, IRON, RETICCTPCT,  in the last 72 hours  Micro Results: No results found for this or any previous visit (from the past 240  hour(s)).  Studies/Results: Dg Chest 2 View  03/20/2014   CLINICAL DATA:  Productive cough.  EXAM: CHEST  2 VIEW  COMPARISON:  07/26/2012  FINDINGS: Heart size and pulmonary vascularity are normal. There is chronic or recurrent peribronchial thickening. The lungs are hyperinflated with an appearance suggestive of emphysema. No acute osseous abnormality.  IMPRESSION: Emphysema.  Recurrent or chronic bronchitic changes.   Electronically Signed   By: Geanie Cooley [name]   On: 03/20/2014 21:26   Ct Angio Chest Pe W/cm &/or Wo Cm  03/21/2014   CLINICAL DATA:  Shortness of breath after laminectomy.  EXAM: CT ANGIOGRAPHY CHEST WITH CONTRAST  TECHNIQUE: Multidetector CT imaging of the chest was performed using the standard protocol during bolus administration of intravenous contrast. Multiplanar CT image reconstructions and MIPs were obtained to evaluate the vascular anatomy.  CONTRAST:  OMNIPAQUE IOHEXOL 350 MG/ML SOLN  COMPARISON:  07/26/2012  FINDINGS: Technically adequate study with good opacification of the central and segmental pulmonary arteries. No focal filling defects are identified. No evidence of significant pulmonary embolus.  Normal heart size. Normal caliber thoracic aorta. Calcification in the coronary arteries, aortic valve, and aorta. No significant lymphadenopathy in the chest. Esophagus is decompressed. Multiple nodular enlargement of the thyroid gland, similar to prior study. Atelectasis or consolidation  in the right lung base. Diffuse emphysematous changes in the lungs. No pneumothorax. No pleural effusions. Visualized portions of the upper abdominal organs are grossly unremarkable. Degenerative changes in the thoracic spine. Old fracture deformity of the right shoulder.  Review of the MIP images confirms the above findings.  IMPRESSION: No evidence of significant pulmonary embolus. Atelectasis or consolidation in the right lung base.   Electronically Signed   By: Burman Nieves [name]   On:  03/21/2014 03:05   Mr Lumbar Spine W Wo Contrast  03/20/2014   CLINICAL DATA:  fever, low back pain, recent surgery of lumbar  EXAM: MRI LUMBAR SPINE WITHOUT AND WITH CONTRAST  TECHNIQUE: Multiplanar and multiecho pulse sequences of the lumbar spine were obtained without and with intravenous contrast.  CONTRAST:  46mL MULTIHANCE GADOBENATE DIMEGLUMINE 529 MG/ML IV SOLN  COMPARISON:  Prior MRI from 03/03/2014.  FINDINGS: For the purposes of this dictation, the lowest well-formed intervertebral disc spaces presumed to be the L5-S1 level, and there presumed to be 5 lumbar type vertebral bodies.  Vertebral bodies are normally aligned with preservation of the normal lumbar lordosis. Vertebral body heights are preserved. No acute fracture or or listhesis. Signal intensity within the vertebral body bone marrow is within normal limits. No evidence of osteomyelitis discitis.  Signal intensity within the visualized spinal cord is within normal limits. Conus medullaris terminates at the L1 level.  Postsurgical changes from interval decompressive laminectomy and facetectomy at L3-4, L4-5, and L5-S1 is seen. Heterogeneous signal intensity seen within the paraspinous soft tissues at the laminectomy site, compatible with postoperative changes. A loculated fluid collection measuring approximately 6.3 x 5.6 x 2.4 cm seen along the midline incision (series 10, image 10). Mild postcontrast enhancement seen about this collection. While this finding may represent a postoperative seroma, possible infection is not excluded. This collection abuts the dorsal aspect of the thecal sac at the L3 and L4 levels along the laminectomy site. No epidural abscess.  L1-2: Shallow central disc protrusion again noted without significant stenosis.  L2-3: Mild facet hypertrophy again noted bilaterally. There is mild diffuse degenerative disc bulge. No significant stenosis.  L3-4: Postoperative changes from prior decompressive laminectomy and medial  facetectomy present. Broad-based disc bulging. No previously seen bilateral lateral recess stenosis is slightly improved. Mild bilateral foraminal narrowing is stable.  L4-5: Sequelae of postoperative decompressive laminectomy and medial facetectomy. Previously seen severe central canal stenosis is markedly improved. Moderate bilateral foraminal narrowing also improved, 0. There is persistent mild bilateral foraminal stenosis at this level.  L5-S1: Postoperative changes from decompressive laminectomy present. Broad-based degenerative disc bulging again seen. Previously seen moderate central canal stenosis is improved as is bilateral lateral recess narrowing. Mild bilateral foraminal stenosis is stable.  Visualized visceral structures are unremarkable.  IMPRESSION: 1. Postoperative changes from interval decompressive laminectomy and medial facetectomy at L3-4, L4-5, and L5-S1. 2. Loculated fluid collection measuring approximately 6.3 x 5.6 x 2.4 cm along the midline incision with mild peripheral post-contrast enhancement. While this finding may reflect postoperative seroma, superimposed infection is not excluded. Clinical correlation recommended. No epidural abscess or evidence of osteomyelitis discitis seen elsewhere within the lumbar spine. 3. Interval improvement in central canal stenosis at L3 through S1 status post spinal decompression. 4. Additional degenerative changes as above.   Electronically Signed   By: Rise Mu [name]   On: 03/20/2014 22:46     Medications: Scheduled: . aspirin EC  81 mg Oral Daily  . bisoprolol-hydrochlorothiazide  1 tablet Oral Daily  .  cefTRIAXone (ROCEPHIN)  IV  1 g Intravenous QHS  . diazepam  5 mg Oral QHS  . diltiazem  240 mg Oral Daily  . docusate sodium  100 mg Oral BID  . enoxaparin (LOVENOX) injection  40 mg Subcutaneous Daily  . folic acid  1 mg Oral Daily  . levothyroxine  50 mcg Oral Once per day on Sun Tue Wed Thu Sat   And  . [START ON 03/22/2014]  levothyroxine  50 mcg Oral Once per day on Mon Fri  . predniSONE  5 mg Oral Q breakfast   Continuous: . sodium chloride 75 mL/hr at 03/21/14 0136    Assessment/Plan:   1. Fever-UTI vs infected seroma postoperatively on immunosuppressants. occurring over 2 weeks after recent lumbar surgery. U/a shows UTI so treating as the source at this time. Will monitor fever curve and leukocytosis. If any decompensation, will reconsult NSG to re-consider post-op infection as a source.   Blood cultures pending #UTI (lower urinary tract infection)-continue Rocephin pending urine cultures  # RA - on home pred dosing at this point. If develops refractory hypotension, consider stress steroids, as likely adrenally suppressed.  #Hypoxia-PE ruled out. O2 as needed # Status post lumbar laminectomy-  Resume PT/OT when able.  # Disposition-anticipate discharge to home once her fever has resolved.  # CODE STATUS-I did discuss CODE STATUS with the patient and she states that she is a full code   DVT Prophylaxis - lovenox    LOS: 1 day   HOLWERDA, SCOTT 03/21/2014, 7:16 AM

## 2014-03-21 NOTE — H&P (Signed)
PCP:   Alysia Penna, MD   Chief Complaint:  fever  HPI: This he is an 80 year old white female with a history of rheumatoid arthritis anemia suppressant therapy, spinal stenosis status post recent lumbar laminectomy (6/7) who presented to the emergency department with the complaint of nausea/vomiting and fever. Patient recently underwent a decompressive lumbar laminectomy on 6/7 by Dr. Marikay Alar. She tolerated the procedure well and was progressing very well at home, ambulating with a walker and able to do some minor household chores. She saw Dr. Yetta Barre yesterday who is pleased with her progress. This afternoon she developed sudden onset of nausea and vomiting associated with fever stating she felt like she was "burning up". She had several episodes of emesis which prompted her to come to the emergency department where she's been evaluated and found to have a temperature 103.3, white blood count 16.5. Urinalysis shows 21-50 white blood cells. MRI of the lumbar spine was performed that showed a loculated fluid collection measuring 6.3 x 5.6 x 2.4 cm along the midline incision. This was reviewed by Dr. Yetta Barre in he felt like it was a postoperative seroma rather than a source of infection. Patient denies any headache, neck stiffness, back pain, radicular pain, or numbness in her feet. She also denies any urinary symptoms or abdominal pain. She is receiving a dose of Rocephin in the emergency department and we are called for admission. Of note, she did have oxygen saturations of 80% on presentation. She endorses cough with scant sputum production but no chest pain or significant shortness of breath. She does her history of DVT and was previously on anticoagulation.   Review of Systems:  Review of Systems - All systems reviewed with the patient and are negative as in history of present illness the following exceptions: Blindness secondary to macular degeneration   Past Medical History: Past Medical  History  Diagnosis Date  . Hypertension   . Rheumatoid arteritis   . Phlebitis   . Deep vein thrombophlebitis of leg   . Fractured pelvis     fall 2011  . Thyroid disease     hypothyroidism  . Humerus fracture   . Thrombocytopenia   . Leukocytosis   . Hypothyroidism   . Phlebitis      Recurrent phlebitis  . Hematoma      Right superior and inferior pubic rami fractures with hematoma adjacent to the right ramus fracture  . Urinary tract infection   . Humerus fracture      Right proximal humerus fracture  . Osteoarthritis     for which she takes chronic prednisone  . Macular degeneration   . Chronic anticoagulation   . Fracture of multiple pubic rami 01/16/2013  . Dementia     MILD   hip fracture  April 2014 , 2011 , 2008 // frequent falls  severe spinal stenosis per MRI 2013 -- ESI in 2013 for therapy RA   hypothyroidism  HTN  macular degeneration - Dr Thomasene Ripple [facility] superficial phlebitis - treated w/ coumadin in the past   Past Surgical History  Procedure Laterality Date  . Left hip open reduction    . Cholecystectomy    . Abdominal hysterectomy  s  . Sinus surgery with instatrak    . Lumbar laminectomy/decompression microdiscectomy N/A 03/06/2014    Procedure: LUMBAR LAMINECTOMY/DECOMPRESSION LUMBAR THREE, FOUR, AND FIVE ;  Surgeon: Tia Alert, MD;  Location: [facility];  Service: Neurosurgery;  Laterality: N/A;  LUMBAR LAMINECTOMY/DECOMPRESSION LUMBAR THREE, FOUR,  AND FIVE    choley  TAH hemorrhoid surgery  vein surgery  THA   Medications: Prior to Admission medications   Medication Sig Start Date End Date Taking? Authorizing Provider  acetaminophen (TYLENOL) 500 MG chewable tablet Chew 1,000 mg by mouth every 6 (six) hours as needed for pain.   Yes Historical Provider, MD  alendronate (FOSAMAX) 70 MG tablet Take 70 mg by mouth once a week. Take on Monday.Take with a full glass of water on an empty stomach.   Yes Historical Provider, MD  aspirin  EC 81 MG tablet Take 81 mg by mouth daily.   Yes Historical Provider, MD  bisoprolol-hydrochlorothiazide (ZIAC) 5-6.25 MG per tablet Take 1 tablet by mouth daily.   Yes Historical Provider, MD  diazepam (VALIUM) 5 MG tablet Take 1 tablet (5 mg total) by mouth at bedtime. 01/18/13  Yes Suszanne Conners Carnaghi, PA-C  diltiazem (CARDIZEM CD) 240 MG 24 hr capsule Take 240 mg by mouth daily. 11/08/12  Yes [name], MD  folic acid (FOLVITE) 1 MG tablet Take 1 mg by mouth daily.   Yes Historical Provider, MD  levothyroxine (SYNTHROID, LEVOTHROID) 50 MCG tablet Take 50-100 mcg by mouth See admin instructions. Take 2 tablets (100 mcg) on Monday and Friday, take 1 tablet (50 mcg) on Sunday, Tuesday, Wednesday, Thursday, Saturday 11/12/11  Yes [name], MD  methotrexate (RHEUMATREX) 2.5 MG tablet Take 10 mg by mouth 2 (two) times a week. On Tuesdays and Wednesdays 01/18/14  Yes Historical Provider, MD  predniSONE (DELTASONE) 5 MG tablet Take 5 mg by mouth daily.   Yes Historical Provider, MD  oxyCODONE-acetaminophen (PERCOCET/ROXICET) 5-325 MG per tablet Take 1 tablet by mouth every 6 (six) hours as needed for moderate pain. 03/11/14   Tia Alert, MD    Allergies:   Allergies  Allergen Reactions  . Morphine And Related     nausea  . Tramadol Other (See Comments)    unknown  . Penicillins Rash    Social History: widowed. retired. needs help w/ shopping, driving, finances.  POA - neighbor Lorane Gell [phone]  Ethics: DNR/DNI  tob: quit 1980s. minimal history  etoh: none   Family History: parents -  MI > 21 , HTN  brother - alchoholism , HTN  sister - CVA > 60 , goiter , melanoma , DM, arthritis, MI > 60 , HTN    Physical Exam: Filed Vitals:   03/20/14 2000 03/20/14 2030 03/20/14 2100 03/20/14 2237  BP: 110/62 108/44 103/37 107/44  Pulse: 73 71 74 70  Temp:      TempSrc:      Resp: 31 21 27  47  SpO2: 95% 95% 98% 96%   General appearance: alert and no distress Head:  Normocephalic, without obvious abnormality, atraumatic Eyes: conjunctivae/corneas clear. PERRL, EOM's intact.  Nose: Nares normal. Septum midline. Mucosa normal. No drainage or sinus tenderness. Throat: lips, mucosa, and tongue normal; teeth and gums normal Neck: no adenopathy, no carotid bruit, no JVD and thyroid not enlarged, symmetric, no tenderness/mass/nodules Resp: clear to auscultation bilaterally Cardio: regular rate and rhythm and Grade 3 of 6 holosystolic murmur over the apex GI: soft, non-tender; bowel sounds normal; no masses,  no organomegaly Extremities: extremities normal, atraumatic, no cyanosis or edema Pulses: 2+ and symmetric Lymph nodes: Cervical adenopathy: no cervical lymphadenopathy Neurologic: Alert and oriented X 3, normal strength and tone. Normal symmetric reflexes.  Skin: Incision is clean with minimal erythema surrounding and mild serous drainage; no surrounding  tenderness  Labs on Admission:   Recent Labs  03/20/14 2000  NA 139  K 3.4*  CL 98  CO2 26  GLUCOSE 135*  BUN 22  CREATININE 0.79  CALCIUM 10.1    Recent Labs  03/20/14 2000  AST 65*  ALT 30  ALKPHOS 57  BILITOT 0.4  PROT 6.3  ALBUMIN 3.2*    Recent Labs  03/20/14 2000  WBC 16.5*  NEUTROABS 14.0*  HGB 12.6  HCT 38.5  MCV 104.9*  PLT 261   No results found for this basename: CKTOTAL, CKMB, CKMBINDEX, TROPONINI,  in the last 72 hours Lab Results  Component Value Date   INR 1.02 03/04/2014   INR 1.06 01/16/2013   INR 2.4 01/03/2013    Radiological Exams on Admission: Dg Chest 2 View  03/20/2014   CLINICAL DATA:  Productive cough.  EXAM: CHEST  2 VIEW  COMPARISON:  07/26/2012  FINDINGS: Heart size and pulmonary vascularity are normal. There is chronic or recurrent peribronchial thickening. The lungs are hyperinflated with an appearance suggestive of emphysema. No acute osseous abnormality.  IMPRESSION: Emphysema.  Recurrent or chronic bronchitic changes.   Electronically Signed    By: Geanie Cooley [name]   On: 03/20/2014 21:26   Mr Lumbar Spine W Wo Contrast  03/20/2014   CLINICAL DATA:  fever, low back pain, recent surgery of lumbar  EXAM: MRI LUMBAR SPINE WITHOUT AND WITH CONTRAST  TECHNIQUE: Multiplanar and multiecho pulse sequences of the lumbar spine were obtained without and with intravenous contrast.  CONTRAST:  13mL MULTIHANCE GADOBENATE DIMEGLUMINE 529 MG/ML IV SOLN  COMPARISON:  Prior MRI from 03/03/2014.  FINDINGS: For the purposes of this dictation, the lowest well-formed intervertebral disc spaces presumed to be the L5-S1 level, and there presumed to be 5 lumbar type vertebral bodies.  Vertebral bodies are normally aligned with preservation of the normal lumbar lordosis. Vertebral body heights are preserved. No acute fracture or or listhesis. Signal intensity within the vertebral body bone marrow is within normal limits. No evidence of osteomyelitis discitis.  Signal intensity within the visualized spinal cord is within normal limits. Conus medullaris terminates at the L1 level.  Postsurgical changes from interval decompressive laminectomy and facetectomy at L3-4, L4-5, and L5-S1 is seen. Heterogeneous signal intensity seen within the paraspinous soft tissues at the laminectomy site, compatible with postoperative changes. A loculated fluid collection measuring approximately 6.3 x 5.6 x 2.4 cm seen along the midline incision (series 10, image 10). Mild postcontrast enhancement seen about this collection. While this finding may represent a postoperative seroma, possible infection is not excluded. This collection abuts the dorsal aspect of the thecal sac at the L3 and L4 levels along the laminectomy site. No epidural abscess.  L1-2: Shallow central disc protrusion again noted without significant stenosis.  L2-3: Mild facet hypertrophy again noted bilaterally. There is mild diffuse degenerative disc bulge. No significant stenosis.  L3-4: Postoperative changes from prior  decompressive laminectomy and medial facetectomy present. Broad-based disc bulging. No previously seen bilateral lateral recess stenosis is slightly improved. Mild bilateral foraminal narrowing is stable.  L4-5: Sequelae of postoperative decompressive laminectomy and medial facetectomy. Previously seen severe central canal stenosis is markedly improved. Moderate bilateral foraminal narrowing also improved, 0. There is persistent mild bilateral foraminal stenosis at this level.  L5-S1: Postoperative changes from decompressive laminectomy present. Broad-based degenerative disc bulging again seen. Previously seen moderate central canal stenosis is improved as is bilateral lateral recess narrowing. Mild bilateral foraminal stenosis is stable.  Visualized visceral structures are unremarkable.  IMPRESSION: 1. Postoperative changes from interval decompressive laminectomy and medial facetectomy at L3-4, L4-5, and L5-S1. 2. Loculated fluid collection measuring approximately 6.3 x 5.6 x 2.4 cm along the midline incision with mild peripheral post-contrast enhancement. While this finding may reflect postoperative seroma, superimposed infection is not excluded. Clinical correlation recommended. No epidural abscess or evidence of osteomyelitis discitis seen elsewhere within the lumbar spine. 3. Interval improvement in central canal stenosis at L3 through S1 status post spinal decompression. 4. Additional degenerative changes as above.   Electronically Signed   By: Rise Mu [name]   On: 03/20/2014 22:46   EKG- low voltage, no acute changes  Assessment/Plan Active Problems: 1. Fever-occurring over 2 weeks after recent surgery. She is on immunosuppressant therapy for RA which increases her risk of postoperative infections. Her urinalysis is consistent with a urinary tract infection which may be the source. MRI shows loculated fluid collection but this is not felt to be the source of infection per Neurosurgery. Treat  with Rocephin to cover urinary tract infection with close monitoring of her fever curve and leukocytosis. Consider further exploration of lumbar fluid collection if fever persists. Blood cultures have been obtained 2. UTI (lower urinary tract infection)-continue Rocephin pending urine cultures 3. Leukocytosis-monitor with antibiotics. 4. Hypoxia-mild postoperative hypoxia without associated chest pain or shortness of breath. This may be secondary to atelectasis or underlying lung disease; however, given recent surgery it is prudent to rule out PE with a CT. She does have a history of thrombophlebitis treated with Coumadin so she is at an increased risk of DVT/PE with her recent surgery. 5. Status post lumbar laminectomy-she has been progressing well after recent surgery. Resume PT/OT when able. 6. Disposition-anticipate discharge to home once her fever has resolved. 7. CODE STATUS-I did discuss CODE STATUS with the patient and she states that she is a full code per  Martha Clan 03/21/2014, 12:04 AM

## 2014-03-21 NOTE — Progress Notes (Signed)
Patient ID: Marissa Parsons, female   DOB: [birth year], 80 y.o.   MRN: [id number] Subjective: Patient reports no back or leg pain.  Objective: Vital signs in last 24 hours: Temp:  [97.6 F (36.4 C)-103.3 F (39.6 C)] 97.6 F (36.4 C) (06/25 0539) Pulse Rate:  [63-75] 63 (06/25 0539) Resp:  [19-47] 20 (06/25 0539) BP: (99-134)/(37-63) 103/58 mmHg (06/25 0539) SpO2:  [95 %-99 %] 99 % (06/25 0539) Weight:  [58.8 kg (129 lb 10.1 oz)] 58.8 kg (129 lb 10.1 oz) (06/25 0109)  Intake/Output from previous day:   Intake/Output this shift:    Neurologic: Grossly normal, do think she is somewhat confused, wound is draining obvious purulence  Lab Results: Lab Results  Component Value Date   WBC 15.2* 03/21/2014   HGB 11.4* 03/21/2014   HCT 35.3* 03/21/2014   MCV 106.6* 03/21/2014   PLT 225 03/21/2014   Lab Results  Component Value Date   INR 1.02 03/04/2014   BMET Lab Results  Component Value Date   NA 140 03/21/2014   K 3.5* 03/21/2014   CL 102 03/21/2014   CO2 27 03/21/2014   GLUCOSE 92 03/21/2014   BUN 16 03/21/2014   CREATININE 0.67 03/21/2014   CALCIUM 9.2 03/21/2014    Studies/Results: Dg Chest 2 View  03/20/2014   CLINICAL DATA:  Productive cough.  EXAM: CHEST  2 VIEW  COMPARISON:  07/26/2012  FINDINGS: Heart size and pulmonary vascularity are normal. There is chronic or recurrent peribronchial thickening. The lungs are hyperinflated with an appearance suggestive of emphysema. No acute osseous abnormality.  IMPRESSION: Emphysema.  Recurrent or chronic bronchitic changes.   Electronically Signed   By: Geanie Cooley [name]   On: 03/20/2014 21:26   Ct Angio Chest Pe W/cm &/or Wo Cm  03/21/2014   CLINICAL DATA:  Shortness of breath after laminectomy.  EXAM: CT ANGIOGRAPHY CHEST WITH CONTRAST  TECHNIQUE: Multidetector CT imaging of the chest was performed using the standard protocol during bolus administration of intravenous contrast. Multiplanar CT image reconstructions and MIPs were obtained  to evaluate the vascular anatomy.  CONTRAST:  OMNIPAQUE IOHEXOL 350 MG/ML SOLN  COMPARISON:  07/26/2012  FINDINGS: Technically adequate study with good opacification of the central and segmental pulmonary arteries. No focal filling defects are identified. No evidence of significant pulmonary embolus.  Normal heart size. Normal caliber thoracic aorta. Calcification in the coronary arteries, aortic valve, and aorta. No significant lymphadenopathy in the chest. Esophagus is decompressed. Multiple nodular enlargement of the thyroid gland, similar to prior study. Atelectasis or consolidation in the right lung base. Diffuse emphysematous changes in the lungs. No pneumothorax. No pleural effusions. Visualized portions of the upper abdominal organs are grossly unremarkable. Degenerative changes in the thoracic spine. Old fracture deformity of the right shoulder.  Review of the MIP images confirms the above findings.  IMPRESSION: No evidence of significant pulmonary embolus. Atelectasis or consolidation in the right lung base.   Electronically Signed   By: Burman Nieves [name]   On: 03/21/2014 03:05   Mr Lumbar Spine W Wo Contrast  03/20/2014   CLINICAL DATA:  fever, low back pain, recent surgery of lumbar  EXAM: MRI LUMBAR SPINE WITHOUT AND WITH CONTRAST  TECHNIQUE: Multiplanar and multiecho pulse sequences of the lumbar spine were obtained without and with intravenous contrast.  CONTRAST:  10mL MULTIHANCE GADOBENATE DIMEGLUMINE 529 MG/ML IV SOLN  COMPARISON:  Prior MRI from 03/03/2014.  FINDINGS: For the purposes of this dictation, the lowest well-formed intervertebral  disc spaces presumed to be the L5-S1 level, and there presumed to be 5 lumbar type vertebral bodies.  Vertebral bodies are normally aligned with preservation of the normal lumbar lordosis. Vertebral body heights are preserved. No acute fracture or or listhesis. Signal intensity within the vertebral body bone marrow is within normal limits. No  evidence of osteomyelitis discitis.  Signal intensity within the visualized spinal cord is within normal limits. Conus medullaris terminates at the L1 level.  Postsurgical changes from interval decompressive laminectomy and facetectomy at L3-4, L4-5, and L5-S1 is seen. Heterogeneous signal intensity seen within the paraspinous soft tissues at the laminectomy site, compatible with postoperative changes. A loculated fluid collection measuring approximately 6.3 x 5.6 x 2.4 cm seen along the midline incision (series 10, image 10). Mild postcontrast enhancement seen about this collection. While this finding may represent a postoperative seroma, possible infection is not excluded. This collection abuts the dorsal aspect of the thecal sac at the L3 and L4 levels along the laminectomy site. No epidural abscess.  L1-2: Shallow central disc protrusion again noted without significant stenosis.  L2-3: Mild facet hypertrophy again noted bilaterally. There is mild diffuse degenerative disc bulge. No significant stenosis.  L3-4: Postoperative changes from prior decompressive laminectomy and medial facetectomy present. Broad-based disc bulging. No previously seen bilateral lateral recess stenosis is slightly improved. Mild bilateral foraminal narrowing is stable.  L4-5: Sequelae of postoperative decompressive laminectomy and medial facetectomy. Previously seen severe central canal stenosis is markedly improved. Moderate bilateral foraminal narrowing also improved, 0. There is persistent mild bilateral foraminal stenosis at this level.  L5-S1: Postoperative changes from decompressive laminectomy present. Broad-based degenerative disc bulging again seen. Previously seen moderate central canal stenosis is improved as is bilateral lateral recess narrowing. Mild bilateral foraminal stenosis is stable.  Visualized visceral structures are unremarkable.  IMPRESSION: 1. Postoperative changes from interval decompressive laminectomy and  medial facetectomy at L3-4, L4-5, and L5-S1. 2. Loculated fluid collection measuring approximately 6.3 x 5.6 x 2.4 cm along the midline incision with mild peripheral post-contrast enhancement. While this finding may reflect postoperative seroma, superimposed infection is not excluded. Clinical correlation recommended. No epidural abscess or evidence of osteomyelitis discitis seen elsewhere within the lumbar spine. 3. Interval improvement in central canal stenosis at L3 through S1 status post spinal decompression. 4. Additional degenerative changes as above.   Electronically Signed   By: Rise Mu [name]   On: 03/20/2014 22:46    Assessment/Plan: Lumbar wound infection and urinary tract infection, now on Rocephin for her UTI. I am recommending irrigation and debridement of lumbar wound. We will do that today. We'll obtain cultures and added vancomycin to her antibiotic regimen until cultures are available.   LOS: 1 day    JONES,DAVID S 03/21/2014, 12:31 PM

## 2014-03-21 NOTE — Progress Notes (Signed)
Pt c/o back pain, SBp 90-92 (11o preop). Dr Noreene Larsson notified. New order for IV Ofirmev. Will cont to monitor closely.

## 2014-03-21 NOTE — Anesthesia Preprocedure Evaluation (Addendum)
Anesthesia Evaluation  Patient identified by MRN, date of birth, ID band Patient awake    Reviewed: Allergy & Precautions, H&P , NPO status , Patient's Chart, lab work & pertinent test results, reviewed documented beta blocker date and time   Airway Mallampati: II TM Distance: >3 FB Neck ROM: full    Dental  (+) Edentulous Upper, Edentulous Lower, Dental Advisory Given   Pulmonary neg pulmonary ROS, former smoker,  breath sounds clear to auscultation        Cardiovascular hypertension, On Medications and Pt. on home beta blockers + Peripheral Vascular Disease Rhythm:regular Rate:Normal     Neuro/Psych PSYCHIATRIC DISORDERS negative neurological ROS     GI/Hepatic negative GI ROS, Neg liver ROS,   Endo/Other  Hypothyroidism   Renal/GU      Musculoskeletal   Abdominal   Peds  Hematology negative hematology ROS (+)   Anesthesia Other Findings See surgeon's H&P   Reproductive/Obstetrics negative OB ROS                        Anesthesia Physical Anesthesia Plan  ASA: III  Anesthesia Plan: General   Post-op Pain Management:    Induction: Intravenous  Airway Management Planned: Oral ETT  Additional Equipment:   Intra-op Plan:   Post-operative Plan: Extubation in OR  Informed Consent: I have reviewed the patients History and Physical, chart, labs and discussed the procedure including the risks, benefits and alternatives for the proposed anesthesia with the patient or authorized representative who has indicated his/her understanding and acceptance.   Dental advisory given  Plan Discussed with: Surgeon and CRNA  Anesthesia Plan Comments:        Anesthesia Quick Evaluation

## 2014-03-21 NOTE — Progress Notes (Signed)
Spoke with brother, who came to visit patient.  He asked that he would be informed when the patient will be having any procedures since patient is visual impaired and forgets easily.  Brother's name is Arnold Long [phone].

## 2014-03-21 NOTE — Progress Notes (Signed)
ANTIBIOTIC CONSULT NOTE - INITIAL  Pharmacy Consult for cefepime and vancomycin Indication: wound infection  Allergies  Allergen Reactions  . Morphine And Related     nausea  . Tramadol Other (See Comments)    unknown  . Penicillins Rash    Patient Measurements: Height: 5\' 7"  (170.2 cm) Weight: 129 lb 10.1 oz (58.8 kg) IBW/kg (Calculated) : 61.6 Adjusted Body Weight:   Vital Signs: Temp: 102.8 F (39.3 C) (06/25 1511) Temp src: Oral (06/25 1511) BP: 123/69 mmHg (06/25 1509) Pulse Rate: 89 (06/25 1509) Intake/Output from previous day:   Intake/Output from this shift:    Labs:  Recent Labs  03/20/14 2000 03/21/14 0415  WBC 16.5* 15.2*  HGB 12.6 11.4*  PLT 261 225  CREATININE 0.79 0.67   Estimated Creatinine Clearance: 49.5 ml/min (by C-G formula based on Cr of 0.67). No results found for this basename: VANCOTROUGH, VANCOPEAK, VANCORANDOM, GENTTROUGH, GENTPEAK, GENTRANDOM, TOBRATROUGH, TOBRAPEAK, TOBRARND, AMIKACINPEAK, AMIKACINTROU, AMIKACIN,  in the last 72 hours   Microbiology: No results found for this or any previous visit (from the past 720 hour(s)).  Medical History: Past Medical History  Diagnosis Date  . Hypertension   . Rheumatoid arteritis   . Phlebitis   . Deep vein thrombophlebitis of leg   . Fractured pelvis     fall 2011  . Thyroid disease     hypothyroidism  . Humerus fracture   . Thrombocytopenia   . Leukocytosis   . Hypothyroidism   . Phlebitis      Recurrent phlebitis  . Hematoma      Right superior and inferior pubic rami fractures with hematoma adjacent to the right ramus fracture  . Urinary tract infection   . Humerus fracture      Right proximal humerus fracture  . Osteoarthritis     for which she takes chronic prednisone  . Macular degeneration   . Chronic anticoagulation   . Fracture of multiple pubic rami 01/16/2013  . Dementia     MILD    Medications:  Scheduled:  . aspirin EC  81 mg Oral Daily  .  bisoprolol-hydrochlorothiazide  1 tablet Oral Daily  . ceFEPime (MAXIPIME) IV  1 g Intravenous Q12H  . diazepam  5 mg Oral QHS  . diltiazem  240 mg Oral Daily  . docusate sodium  100 mg Oral BID  . enoxaparin (LOVENOX) injection  40 mg Subcutaneous Daily  . folic acid  1 mg Oral Daily  . levothyroxine  50 mcg Oral Once per day on Sun Tue Wed Thu Sat   And  . [START ON 03/22/2014] levothyroxine  50 mcg Oral Once per day on Mon Fri  . predniSONE  5 mg Oral Q breakfast  . vancomycin  500 mg Intravenous Q12H   Assessment: 80 yr old female with a lumbar wound and infection and UTI. She was started on Rocephin for the UTI but Has been changed today to cefepime and vancomycin until cultures are back.   Goal of Therapy:  Vancomycin trough level 15-20 mcg/ml  Plan:  Cefepime 1 GM IV q12hr. Vancomycin 500 mg IV q12h. Vanc trough levels when appropriate.  91 03/21/2014,5:02 PM

## 2014-03-21 NOTE — Progress Notes (Signed)
Called to get report. Nurse stated she would call back

## 2014-03-21 NOTE — Op Note (Signed)
03/20/2014 - 03/21/2014  6:46 PM  PATIENT:  Marissa Parsons  80 y.o. female  PRE-OPERATIVE DIAGNOSIS:  Lumbar wound infection  POST-OPERATIVE DIAGNOSIS:  Same  PROCEDURE:  Irrigation and debridement of lumbar wound  SURGEON:  Marikay Alar, MD  ASSISTANTS: None  ANESTHESIA:   General  EBL: 25 ml     BLOOD ADMINISTERED:none  DRAINS: Medium Hemovac drain   SPECIMEN:  No Specimen  INDICATION FOR PROCEDURE: This patient underwent a decompressive laminectomy couple of weeks ago. She presented to the office 2 days ago and seemed to be doing extremely well. She presented emergency department last night with fevers and nausea. She had aortic transection was admitted and put on Rocephin. By today she was draining purulence from her wound. I recommended urgent irrigation and debridement of lumbar wound. Patient understood the risks, benefits, and alternatives and potential outcomes and wished to proceed.  PROCEDURE DETAILS:  the patient was taken to the operating room and after induction of adequate generalized endotracheal anesthesia she was rolled into the prone position on chest rolls all pressure points were padded. Her lumbar region was prepped with DuraPrep and draped in the usual sterile fashion. Her old incision was opened with immediate release of purulence and cultures were sent for Gram stain and anaerobic and aerobic culture. I opened the fascia and the musculature. It did not appear that the infection with subfascial. I removed the old Gelfoam from the surface of the dura and inspected the dura. I then irrigated with 2 L of saline solution containing bacitracin. I debrided the tissues. I cut away the bad edges of the incision. I then placed a medium Hemovac drain in the subfascial space as well as the suprafascial space. I closed the muscle and the fascia with 0 Vicryl. I closed the subcutaneous tissues with 2-0 Vicryl. I closed the subcuticular tissues with 3-0 Vicryl. The wound was  closed with staples and a sterile dressing was applied. The patient was then awakened from general anesthesia and transferred to the recovery in stable condition. At the end of the procedure all sponge needle and instrument counts were correct. She was started on vancomycin and Rocephin after cultures were obtained.  PLAN OF CARE: Admit to inpatient   PATIENT DISPOSITION:  PACU - hemodynamically stable.   Delay start of Pharmacological VTE agent (>24hrs) due to surgical blood loss or risk of bleeding:  yes

## 2014-03-21 NOTE — ED Notes (Signed)
Orders mistakenly placed on incorrect pt.

## 2014-03-21 NOTE — Anesthesia Procedure Notes (Signed)
Procedure Name: Intubation Date/Time: 03/21/2014 5:49 PM Performed by: De Nurse Pre-anesthesia Checklist: Patient identified, Emergency Drugs available, Suction available, Patient being monitored and Timeout performed Patient Re-evaluated:Patient Re-evaluated prior to inductionOxygen Delivery Method: Circle system utilized Preoxygenation: Pre-oxygenation with 100% oxygen Intubation Type: IV induction Ventilation: Mask ventilation without difficulty Laryngoscope Size: Mac and 3 Grade View: Grade I Tube type: Oral Tube size: 7.0 mm Number of attempts: 1 Airway Equipment and Method: Stylet Placement Confirmation: ETT inserted through vocal cords under direct vision,  positive ETCO2 and breath sounds checked- equal and bilateral Secured at: 21 cm Tube secured with: Tape Dental Injury: Teeth and Oropharynx as per pre-operative assessment

## 2014-03-21 NOTE — Progress Notes (Signed)
Broadening abx coverage from rocephin to cefepime to cover anaerobes

## 2014-03-21 NOTE — ED Notes (Signed)
MD at bedside. 

## 2014-03-22 ENCOUNTER — Inpatient Hospital Stay (HOSPITAL_COMMUNITY): Payer: Medicare HMO

## 2014-03-22 MED ORDER — LEVOTHYROXINE SODIUM 25 MCG PO TABS
50.0000 ug | ORAL_TABLET | ORAL | Status: DC
  Administered 2014-03-23 – 2014-03-24 (×2): 50 ug via ORAL
  Filled 2014-03-22 (×2): qty 2

## 2014-03-22 MED ORDER — LEVOTHYROXINE SODIUM 100 MCG PO TABS: 100.0000 ug | ORAL_TABLET | ORAL | Status: DC

## 2014-03-22 MED ORDER — VANCOMYCIN HCL 500 MG IV SOLR
500.0000 mg | Freq: Two times a day (BID) | INTRAVENOUS | Status: DC
  Administered 2014-03-22 – 2014-03-26 (×8): 500 mg via INTRAVENOUS
  Filled 2014-03-22 (×9): qty 500

## 2014-03-22 NOTE — Progress Notes (Signed)
Occupational Therapy Evaluation Patient Details Name: Marissa Parsons MRN: [id number] DOB: 03-Oct-1928 Today's Date: 03/22/2014    History of Present Illness Marissa Parsons is an [name] admitted 03/20/14 with nausea/vomiting and fever. Pt is s/p lumbar laminectomy on 03/03/14 and was d/c home where she was ambulating with a walker and doing minor household chores. Upon arrival to ED, pt found to have temperature of 103.3 and O2 sats in the 80's.   Clinical Impression   Pt was ambulating at home with a RW following her d/c on 03/11/14. She reports that she was independent with ADLs and with minor household tasks, however friends and family brought her meals. Pt currently requires min (A) to stand due to weakness, likely from the infection and surgery. Pt would benefit from skilled OT to increase independence prior to d/c.     Follow Up Recommendations  Home health OT;Supervision/Assistance - 24 hour    Equipment Recommendations  None recommended by OT       Precautions / Restrictions Precautions Precautions: Back;Fall Precaution Comments: Educated pt on 3/3 back precautions. Pt reports she was managing at home with ADLs "doing it how I always do." Restrictions Weight Bearing Restrictions: No      Mobility Bed Mobility Overal bed mobility: Needs Assistance Bed Mobility: Rolling;Sidelying to Sit Rolling: Min assist Sidelying to sit: Min assist       General bed mobility comments: Pt required min (A) for bed mobility and reports she was not log rolling at home. VC's for sequencing. Pt with good EOB sitting balance.   Transfers Overall transfer level: Needs assistance Equipment used: Rolling walker (2 wheeled) Transfers: Sit to/from Stand Sit to Stand: Min assist         General transfer comment: Pt moving well and requires (A) to power up to standing due to generalized weakness from surgery/infection.         ADL Overall ADL's : Needs  assistance/impaired Eating/Feeding: Independent;Sitting   Grooming: Min guard;Standing   Upper Body Bathing: Set up;Sitting   Lower Body Bathing: Minimal assistance;Sit to/from stand Lower Body Bathing Details (indicate cue type and reason): re-educated pt on back precautions and use of long handled sponge for LB bathing. Upper Body Dressing : Set up;Sitting   Lower Body Dressing: Moderate assistance;Sit to/from stand   Toilet Transfer: Minimal assistance;Ambulation;Comfort height toilet;RW;Grab bars   Toileting- Clothing Manipulation and Hygiene: Minimal assistance;Sit to/from stand;Adhering to back precautions   Tub/ Shower Transfer: Walk-in shower;Min guard;Ambulation;Rolling walker   Functional mobility during ADLs: Min guard;Rolling walker General ADL Comments: Pt reports that she was doing well at home until "this incident happened." Pt was unable to recall back precautions and needed frequent VC's to maintain precautions during bed mobility. Pt reports she was not log rolling at home to get out of bed. Pt requires min A to power up to standing, especially from toilet or bed. Pt has high desire to return home.     Vision  Pt has history of macular degeneration and does not drive.   Pt reports no change from baseline.                 Perception Perception Perception Tested?: No   Praxis Praxis Praxis tested?: Within functional limits    Pertinent Vitals/Pain No c/o pain, NAD     Hand Dominance Right   Extremity/Trunk Assessment Upper Extremity Assessment Upper Extremity Assessment: Generalized weakness   Lower Extremity Assessment Lower Extremity Assessment: Generalized weakness   Cervical /  Trunk Assessment Cervical / Trunk Assessment: Kyphotic   Communication Communication Communication: No difficulties   Cognition Arousal/Alertness: Awake/alert Behavior During Therapy: WFL for tasks assessed/performed Overall Cognitive Status: Within Functional  Limits for tasks assessed                                Home Living Family/patient expects to be discharged to:: Private residence Living Arrangements: Alone Available Help at Discharge: Family;Available PRN/intermittently;Friend(s) Type of Home: House Home Access: Stairs to enter [employer] of Steps: 2 Entrance Stairs-Rails: Can reach both Home Layout: One level     Bathroom Shower/Tub: Producer, television/film/video: Handicapped height Bathroom Accessibility: Yes How Accessible: Accessible via walker Home Equipment: Walker - 2 wheels;Shower seat   Additional Comments: pt does not drive due to impaired vision      Prior Functioning/Environment Level of Independence: Independent with assistive device(s)        Comments: Pt was ambulating around home with RW. Pt also reports she was doing ADLs and minor household tasks independently, however family and friends brought most meals.     OT Diagnosis: Generalized weakness;Acute pain   OT Problem List: Decreased strength;Decreased range of motion;Decreased activity tolerance;Impaired balance (sitting and/or standing);Decreased safety awareness;Decreased knowledge of use of DME or AE;Decreased knowledge of precautions;Pain   OT Treatment/Interventions: Self-care/ADL training;Energy conservation;DME and/or AE instruction;Therapeutic activities;Patient/family education;Balance training    OT Goals(Current goals can be found in the care plan section) Acute Rehab OT Goals Patient Stated Goal: to go home OT Goal Formulation: With patient Time For Goal Achievement: 03/29/14 Potential to Achieve Goals: Good ADL Goals Pt Will Perform Grooming: with modified independence;standing Pt Will Transfer to Toilet: with modified independence;ambulating;regular height toilet Pt Will Perform Toileting - Clothing Manipulation and hygiene: with modified independence;sit to/from stand Pt Will Perform Tub/Shower Transfer:  Shower transfer;with modified independence;ambulating;rolling walker  OT Frequency: Min 2X/week   Barriers to D/C: Decreased caregiver support             End of Session Equipment Utilized During Treatment: Gait belt;Rolling walker  Activity Tolerance: Patient tolerated treatment well Patient left: in chair;with call bell/phone within reach;with chair alarm set   Time: 1205-1223 OT Time Calculation (min): 18 min Charges:  OT General Charges $OT Visit: 1 Procedure OT Evaluation $Initial OT Evaluation Tier I: 1 Procedure OT Treatments $Self Care/Home Management : 8-22 mins  Marissa Parsons [phone] 03/22/2014, 2:06 PM

## 2014-03-22 NOTE — Progress Notes (Signed)
Patient is active with [facility] as prior to admission; B Chandler RN,BSN,MHA [phone] 

## 2014-03-22 NOTE — Progress Notes (Signed)
ANTIBIOTIC CONSULT NOTE  Pharmacy Consult for ceftriaxone and vancomycin Indication: wound infection  Allergies  Allergen Reactions  . Morphine And Related     nausea  . Tramadol Other (See Comments)    unknown  . Penicillins Rash    Patient Measurements: Height: 5\' 7"  (170.2 cm) Weight: 129 lb 10.1 oz (58.8 kg) IBW/kg (Calculated) : 61.6 Adjusted Body Weight:   Vital Signs: Temp: 97.4 F (36.3 C) (06/26 0949) Temp src: Oral (06/26 0949) BP: 99/47 mmHg (06/26 0949) Pulse Rate: 70 (06/26 0949) Intake/Output from previous day: 06/25 0701 - 06/26 0700 In: 1170 [P.O.:120; I.V.:800; IV Piggyback:250] Out: 56 [Urine:1; Drains:55] Intake/Output from this shift:    Labs:  Recent Labs  03/20/14 2000 03/21/14 0415  WBC 16.5* 15.2*  HGB 12.6 11.4*  PLT 261 225  CREATININE 0.79 0.67   Estimated Creatinine Clearance: 49.5 ml/min (by C-G formula based on Cr of 0.67). No results found for this basename: VANCOTROUGH, 03/23/14, VANCORANDOM, GENTTROUGH, GENTPEAK, GENTRANDOM, TOBRATROUGH, TOBRAPEAK, TOBRARND, AMIKACINPEAK, AMIKACINTROU, AMIKACIN,  in the last 72 hours   Microbiology: Recent Results (from the past 720 hour(s))  URINE CULTURE     Status: None   Collection Time    03/20/14  8:58 PM      Result Value Ref Range Status   Specimen Description URINE, CATHETERIZED   Final   Special Requests NONE   Final   Culture  Setup Time     Final   Value: 03/20/2014 22:00     Performed at 03/22/2014 Count     Final   Value: >=100,000 COLONIES/ML     Performed at [facility]   Culture     Final   Value: Multiple bacterial morphotypes present, none predominant. Suggest appropriate recollection if clinically indicated.     Performed at [facility]   Report Status 03/21/2014 FINAL   Final  CULTURE, BLOOD (ROUTINE X 2)     Status: None   Collection Time    03/20/14 10:30 PM      Result Value Ref Range Status   Specimen Description BLOOD  LEFT ARM   Final   Special Requests BOTTLES DRAWN AEROBIC AND ANAEROBIC [facility] EACH   Final   Culture  Setup Time     Final   Value: 03/21/2014 09:23     Performed at 03/23/2014   Culture     Final   Value:        BLOOD CULTURE RECEIVED NO GROWTH TO DATE CULTURE WILL BE HELD FOR 5 DAYS BEFORE ISSUING A FINAL NEGATIVE REPORT     Performed at [facility]   Report Status PENDING   Incomplete  CULTURE, BLOOD (ROUTINE X 2)     Status: None   Collection Time    03/20/14 10:38 PM      Result Value Ref Range Status   Specimen Description BLOOD LEFT HAND   Final   Special Requests BOTTLES DRAWN AEROBIC AND ANAEROBIC [place] EACH   Final   Culture  Setup Time     Final   Value: 03/21/2014 09:25     Performed at 03/23/2014   Culture     Final   Value:        BLOOD CULTURE RECEIVED NO GROWTH TO DATE CULTURE WILL BE HELD FOR 5 DAYS BEFORE ISSUING A FINAL NEGATIVE REPORT     Performed at [facility]   Report Status PENDING  Incomplete  WOUND CULTURE     Status: None   Collection Time    03/21/14  6:02 PM      Result Value Ref Range Status   Specimen Description WOUND BACK   Final   Special Requests     Final   Value: PATIENT ON FOLLOWING ROCEPHIN LUMBAR AEROBIC AND ANAEROBIC   Gram Stain     Final   Value: FEW WBC PRESENT,BOTH PMN AND MONONUCLEAR     NO SQUAMOUS EPITHELIAL CELLS SEEN     RARE GRAM POSITIVE COCCI IN PAIRS     IN CLUSTERS     Performed at [facility]   Culture PENDING   Incomplete   Report Status PENDING   Incomplete  ANAEROBIC CULTURE     Status: None   Collection Time    03/21/14  6:02 PM      Result Value Ref Range Status   Specimen Description WOUND BACK   Final   Special Requests     Final   Value: PATIENT ON FOLLOWING ROCEPHIN LUMBAR AEROBIC AND ANAEROBIC   Gram Stain     Final   Value: FEW WBC PRESENT,BOTH PMN AND MONONUCLEAR     NO SQUAMOUS EPITHELIAL CELLS SEEN     RARE GRAM POSITIVE COCCI IN PAIRS     IN CLUSTERS      Performed at [facility]   Culture     Final   Value: NO ANAEROBES ISOLATED; CULTURE IN PROGRESS FOR 5 DAYS     Performed at [facility]   Report Status PENDING   Incomplete  WOUND CULTURE     Status: None   Collection Time    03/21/14  6:03 PM      Result Value Ref Range Status   Specimen Description WOUND BACK   Final   Special Requests LUMBAR WOUND AEROBIC COLLECTION ONLY   Final   Gram Stain     Final   Value: FEW WBC PRESENT, PREDOMINANTLY PMN     NO SQUAMOUS EPITHELIAL CELLS SEEN     FEW GRAM POSITIVE COCCI     IN PAIRS     Performed at [facility]   Culture PENDING   Incomplete   Report Status PENDING   Incomplete   Assessment: 80 yr old female with a lumbar wound infection s/p I&D and vac placement as well as UTI. A few changes were made to antibiotics yesterday, but now patient is to continue on ceftriaxone and vancomycin. Her SCr is 0.67 with est CrCl ~74mL/min. Urine culture with >100K of multiple bacteria. Blood cultures no growth to date. Wound culture has grown out GPC with no anaerobes. WBC 15.2, Tmax/24h 102.8.  Goal of Therapy:  Vancomycin trough level 15-20 mcg/ml  Plan:  1. Ceftriaxone 1g IV q24h 2. Vancomycin 500mg  IV q12h 3. Follow c/s, clinical progression, LOT, renal function and trough at [facility]  Peirce Deveney D. Fabienne Nolasco, PharmD, BCPS Clinical Pharmacist Pager: [phone] 03/22/2014 11:19 AM

## 2014-03-22 NOTE — Progress Notes (Signed)
[facility]  Patient Status: Active (receiving services up to time of hospitalization)  [facility] is providing the following services: PT and OT  If patient discharges after hours, please call [phone].   Marissa Parsons 03/22/2014, 4:13 PM

## 2014-03-22 NOTE — Progress Notes (Signed)
Pt BP noted to be in the 80's but pt asymptomatic. Pt assisted back from chair to bed; MD office called and message left with RN because office RN report MD in surgery. Office RN said to inform MD. Awaiting on new orders from MD. Will continue to monitor pt quietly. Arabella Merles Nevaeha Finerty RN.

## 2014-03-22 NOTE — Progress Notes (Signed)
Physical Therapy Treatment Patient Details Name: Marissa Parsons MRN: [id number] DOB: [birth year] Today's Date: 03/22/2014    History of Present Illness Pt is an 80 y/o female admitted with nausea/vomiting and fever. Patient recently underwent a decompressive lumbar laminectomy on 6/7 by Dr. Marikay Alar. She tolerated the procedure well and was progressing very well at home, ambulating with a walker and able to do some minor household chores. The afternoon of admission she developed sudden onset of nausea and vomiting associated with fever stating she felt like she was "burning up". She had several episodes of emesis which prompted her to come to the emergency department. She was evaluated and found to have a temperature 103.3 with O2 sats in the 80's.     PT Comments    Pt progressing towards physical therapy goals. Functionally, much improved from yesterday's session. Was able to ambulate ~100 feet with 1 seated rest break. Recommending HHPT to follow up at d/c.   Follow Up Recommendations  Home health PT;Supervision/Assistance - 24 hour     Equipment Recommendations  None recommended by PT    Recommendations for Other Services       Precautions / Restrictions Precautions Precautions: Back;Fall Precaution Comments: Pt unable to recall any of her back precautions. Reviewed 3/3 precautions and pt was unable to state them back to me.  Restrictions Weight Bearing Restrictions: No    Mobility  Bed Mobility Overal bed mobility: Needs Assistance Bed Mobility: Rolling;Sidelying to Sit Rolling: Min assist Sidelying to sit: Min assist       General bed mobility comments: VC's for log roll technique. Reports this is what she was working on with HHPT. Assist required for full roll as well as trunk elevation to full sitting position.   Transfers Overall transfer level: Needs assistance Equipment used: Rolling walker (2 wheeled) Transfers: Sit to/from Stand Sit to Stand: Min assist          General transfer comment: Pt moving well and requires (A) to power up to standing due to generalized weakness from surgery/infection.  Ambulation/Gait Ambulation/Gait assistance: Min assist Ambulation Distance (Feet): 100 Feet (50'x2) Assistive device: Rolling walker (2 wheeled) Gait Pattern/deviations: Step-through pattern;Decreased stride length;Trunk flexed;Narrow base of support Gait velocity: Decreased Gait velocity interpretation: Below normal speed for age/gender General Gait Details: VC's for improved posture. Had pt stop occasionally and "reset" her body in proper posture before continuing on. 1 seated rest berak ~3 minutes.    Stairs            Wheelchair Mobility    Modified Rankin (Stroke Patients Only)       Balance Overall balance assessment: Needs assistance Sitting-balance support: Feet supported;No upper extremity supported Sitting balance-Leahy Scale: Fair     Standing balance support: Bilateral upper extremity supported Standing balance-Leahy Scale: Fair                      Cognition Arousal/Alertness: Awake/alert Behavior During Therapy: WFL for tasks assessed/performed Overall Cognitive Status: Within Functional Limits for tasks assessed                      Exercises      General Comments General comments (skin integrity, edema, etc.): Pt/family education regarding back precautions and log roll technique.       Pertinent Vitals/Pain Vitals stable throughout session. Pt reports minimal back pain while ambulating.     Home Living Family/patient expects to be discharged to:: Private residence Living Arrangements:  Alone Available Help at Discharge: Family;Available PRN/intermittently;Friend(s) Type of Home: House Home Access: Stairs to enter Entrance Stairs-Rails: Can reach both Home Layout: One level Home Equipment: Walker - 2 wheels;Shower seat Additional Comments: pt does not drive due to impaired vision     Prior Function Level of Independence: Independent with assistive device(s)      Comments: Pt was ambulating around home with RW. Pt also reports she was doing ADLs and minor household tasks independently, however family and friends brought most meals.    PT Goals (current goals can now be found in the care plan section) Acute Rehab PT Goals Patient Stated Goal: to go home PT Goal Formulation: With patient Time For Goal Achievement: 04/04/14 Potential to Achieve Goals: Good Progress towards PT goals: Progressing toward goals    Frequency  Min 5X/week    PT Plan Current plan remains appropriate    Co-evaluation             End of Session   Activity Tolerance: Patient tolerated treatment well Patient left: in bed;with call bell/phone within reach;with family/visitor present     Time: 1341-1400 PT Time Calculation (min): 19 min  Charges:  $Gait Training: 8-22 mins                    G Codes:      Ruthann Cancer March 29, 2014, 2:15 PM  Ruthann Cancer, PT, DPT Acute Rehabilitation Services Pager: [phone]

## 2014-03-22 NOTE — Progress Notes (Signed)
Physician Daily Progress Note  Subjective: No complaints today  No back pain  Drain in place    Objective: Vital signs in last 24 hours: Temp:  [98.9 F (37.2 C)-102.8 F (39.3 C)] 101.4 F (38.6 C) (06/26 0547) Pulse Rate:  [63-89] 74 (06/26 0547) Resp:  [16-24] 20 (06/26 0547) BP: (85-123)/(29-87) 98/87 mmHg (06/26 0547) SpO2:  [89 %-99 %] 93 % (06/26 0547) Weight change:  Last BM Date: 03/20/14  CBG (last 3)  No results found for this basename: GLUCAP,  in the last 72 hours  Intake/Output from previous day:  Intake/Output Summary (Last 24 hours) at 03/22/14 0823 Last data filed at 03/22/14 0600  Gross per 24 hour  Intake   1170 ml  Output     56 ml  Net   1114 ml   06/25 0701 - 06/26 0700 In: 1170 [P.O.:120; I.V.:800; IV Piggyback:250] Out: 56 [Urine:1; Drains:55]  Physical Exam General appearance: WF in NAD  Eyes: no scleral icterus Throat: oropharynx moist without erythema Resp: CTAB, no wheezes, rales  Cardio: RRR, no MRG  GI: soft, non-tender; bowel sounds normal; no masses,  no organomegaly Extremities: no clubbing, cyanosis or edema Lumbar drain w/ serous drainage   Lab Results:  Recent Labs  03/20/14 2000 03/21/14 0415  NA 139 140  K 3.4* 3.5*  CL 98 102  CO2 26 27  GLUCOSE 135* 92  BUN 22 16  CREATININE 0.79 0.67  CALCIUM 10.1 9.2     Recent Labs  03/20/14 2000  AST 65*  ALT 30  ALKPHOS 57  BILITOT 0.4  PROT 6.3  ALBUMIN 3.2*     Recent Labs  03/20/14 2000 03/21/14 0415  WBC 16.5* 15.2*  NEUTROABS 14.0*  --   HGB 12.6 11.4*  HCT 38.5 35.3*  MCV 104.9* 106.6*  PLT 261 225    Lab Results  Component Value Date   INR 1.02 03/04/2014   INR 1.06 01/16/2013   INR 2.4 01/03/2013    No results found for this basename: CKTOTAL, CKMB, CKMBINDEX, TROPONINI,  in the last 72 hours  No results found for this basename: TSH, T4TOTAL, FREET3, T3FREE, THYROIDAB,  in the last 72 hours  No results found for this basename:  VITAMINB12, FOLATE, FERRITIN, TIBC, IRON, RETICCTPCT,  in the last 72 hours  Micro Results: Recent Results (from the past 240 hour(s))  URINE CULTURE     Status: None   Collection Time    03/20/14  8:58 PM      Result Value Ref Range Status   Specimen Description URINE, CATHETERIZED   Final   Special Requests NONE   Final   Culture  Setup Time     Final   Value: 03/20/2014 22:00     Performed at [facility] Count     Final   Value: >=100,000 COLONIES/ML     Performed at [facility]   Culture     Final   Value: Multiple bacterial morphotypes present, none predominant. Suggest appropriate recollection if clinically indicated.     Performed at [facility]   Report Status 03/21/2014 FINAL   Final  WOUND CULTURE     Status: None   Collection Time    03/21/14  6:02 PM      Result Value Ref Range Status   Specimen Description WOUND BACK   Final   Special Requests     Final   Value: PATIENT ON FOLLOWING ROCEPHIN LUMBAR AEROBIC  AND ANAEROBIC   Gram Stain PENDING   Incomplete   Culture PENDING   Incomplete   Report Status PENDING   Incomplete  ANAEROBIC CULTURE     Status: None   Collection Time    03/21/14  6:02 PM      Result Value Ref Range Status   Specimen Description WOUND BACK   Final   Special Requests     Final   Value: PATIENT ON FOLLOWING ROCEPHIN LUMBAR AEROBIC AND ANAEROBIC   Gram Stain PENDING   Incomplete   Culture PENDING   Incomplete   Report Status PENDING   Incomplete  WOUND CULTURE     Status: None   Collection Time    03/21/14  6:03 PM      Result Value Ref Range Status   Specimen Description WOUND BACK   Final   Special Requests LUMBAR WOUND AEROBIC COLLECTION ONLY   Final   Gram Stain PENDING   Incomplete   Culture PENDING   Incomplete   Report Status PENDING   Incomplete    Studies/Results: Dg Chest 2 View  03/20/2014   CLINICAL DATA:  Productive cough.  EXAM: CHEST  2 VIEW  COMPARISON:  07/26/2012  FINDINGS: Heart  size and pulmonary vascularity are normal. There is chronic or recurrent peribronchial thickening. The lungs are hyperinflated with an appearance suggestive of emphysema. No acute osseous abnormality.  IMPRESSION: Emphysema.  Recurrent or chronic bronchitic changes.   Electronically Signed   By: Geanie Cooley [name]   On: 03/20/2014 21:26   Ct Angio Chest Pe W/cm &/or Wo Cm  03/21/2014   CLINICAL DATA:  Shortness of breath after laminectomy.  EXAM: CT ANGIOGRAPHY CHEST WITH CONTRAST  TECHNIQUE: Multidetector CT imaging of the chest was performed using the standard protocol during bolus administration of intravenous contrast. Multiplanar CT image reconstructions and MIPs were obtained to evaluate the vascular anatomy.  CONTRAST:  OMNIPAQUE IOHEXOL 350 MG/ML SOLN  COMPARISON:  07/26/2012  FINDINGS: Technically adequate study with good opacification of the central and segmental pulmonary arteries. No focal filling defects are identified. No evidence of significant pulmonary embolus.  Normal heart size. Normal caliber thoracic aorta. Calcification in the coronary arteries, aortic valve, and aorta. No significant lymphadenopathy in the chest. Esophagus is decompressed. Multiple nodular enlargement of the thyroid gland, similar to prior study. Atelectasis or consolidation in the right lung base. Diffuse emphysematous changes in the lungs. No pneumothorax. No pleural effusions. Visualized portions of the upper abdominal organs are grossly unremarkable. Degenerative changes in the thoracic spine. Old fracture deformity of the right shoulder.  Review of the MIP images confirms the above findings.  IMPRESSION: No evidence of significant pulmonary embolus. Atelectasis or consolidation in the right lung base.   Electronically Signed   By: Burman Nieves [name]   On: 03/21/2014 03:05   Mr Lumbar Spine W Wo Contrast  03/20/2014   CLINICAL DATA:  fever, low back pain, recent surgery of lumbar  EXAM: MRI LUMBAR SPINE  WITHOUT AND WITH CONTRAST  TECHNIQUE: Multiplanar and multiecho pulse sequences of the lumbar spine were obtained without and with intravenous contrast.  CONTRAST:  67mL MULTIHANCE GADOBENATE DIMEGLUMINE 529 MG/ML IV SOLN  COMPARISON:  Prior MRI from 03/03/2014.  FINDINGS: For the purposes of this dictation, the lowest well-formed intervertebral disc spaces presumed to be the L5-S1 level, and there presumed to be 5 lumbar type vertebral bodies.  Vertebral bodies are normally aligned with preservation of the normal lumbar lordosis. Vertebral  body heights are preserved. No acute fracture or or listhesis. Signal intensity within the vertebral body bone marrow is within normal limits. No evidence of osteomyelitis discitis.  Signal intensity within the visualized spinal cord is within normal limits. Conus medullaris terminates at the L1 level.  Postsurgical changes from interval decompressive laminectomy and facetectomy at L3-4, L4-5, and L5-S1 is seen. Heterogeneous signal intensity seen within the paraspinous soft tissues at the laminectomy site, compatible with postoperative changes. A loculated fluid collection measuring approximately 6.3 x 5.6 x 2.4 cm seen along the midline incision (series 10, image 10). Mild postcontrast enhancement seen about this collection. While this finding may represent a postoperative seroma, possible infection is not excluded. This collection abuts the dorsal aspect of the thecal sac at the L3 and L4 levels along the laminectomy site. No epidural abscess.  L1-2: Shallow central disc protrusion again noted without significant stenosis.  L2-3: Mild facet hypertrophy again noted bilaterally. There is mild diffuse degenerative disc bulge. No significant stenosis.  L3-4: Postoperative changes from prior decompressive laminectomy and medial facetectomy present. Broad-based disc bulging. No previously seen bilateral lateral recess stenosis is slightly improved. Mild bilateral foraminal narrowing  is stable.  L4-5: Sequelae of postoperative decompressive laminectomy and medial facetectomy. Previously seen severe central canal stenosis is markedly improved. Moderate bilateral foraminal narrowing also improved, 0. There is persistent mild bilateral foraminal stenosis at this level.  L5-S1: Postoperative changes from decompressive laminectomy present. Broad-based degenerative disc bulging again seen. Previously seen moderate central canal stenosis is improved as is bilateral lateral recess narrowing. Mild bilateral foraminal stenosis is stable.  Visualized visceral structures are unremarkable.  IMPRESSION: 1. Postoperative changes from interval decompressive laminectomy and medial facetectomy at L3-4, L4-5, and L5-S1. 2. Loculated fluid collection measuring approximately 6.3 x 5.6 x 2.4 cm along the midline incision with mild peripheral post-contrast enhancement. While this finding may reflect postoperative seroma, superimposed infection is not excluded. Clinical correlation recommended. No epidural abscess or evidence of osteomyelitis discitis seen elsewhere within the lumbar spine. 3. Interval improvement in central canal stenosis at L3 through S1 status post spinal decompression. 4. Additional degenerative changes as above.   Electronically Signed   By: Rise MuBenjamin  McClintock [name]   On: 03/20/2014 22:46     Medications: Scheduled: . aspirin EC  81 mg Oral Daily  . bisoprolol-hydrochlorothiazide  1 tablet Oral Daily  . cefTRIAXone (ROCEPHIN)  IV  1 g Intravenous Q24H  . diltiazem  240 mg Oral Daily  . docusate sodium  100 mg Oral BID  . folic acid  1 mg Oral Daily  . levothyroxine  50 mcg Oral Once per day on Sun Tue Wed Thu Sat   And  . levothyroxine  50 mcg Oral Once per day on Mon Fri  . predniSONE  5 mg Oral Q breakfast  . sodium chloride  3 mL Intravenous Q12H   Continuous: . sodium chloride    . 0.9 % NaCl with KCl 20 mEq / L 75 mL/hr at 03/21/14 2200    Assessment/Plan:   1.  Fever- infected seroma postoperatively on immunosuppressants. Lumbar I&D w/ drain placement yesterday. I have attempted calling [facility] to see if appropriate to take on their service, but have received no call back. Will continue to follow but primary issue at this time is lumbar wound infection . Plan per [facility]  # RA - on home pred dosing at this point. If develops refractory hypotension, consider stress steroids, as likely adrenally suppressed.  #Hypoxia-PE ruled  out. O2 as needed # Status post lumbar laminectomy-  Resume PT/OT when able.  # Disposition-per [facility]  # CODE STATUS-I did discuss CODE STATUS with the patient and she states that she is a full code   DVT Prophylaxis - lovenox    LOS: 2 days   Denario Bagot 03/22/2014, 8:23 AM

## 2014-03-22 NOTE — Transfer of Care (Signed)
Immediate Anesthesia Transfer of Care Note  Patient: Marissa Parsons  Procedure(s) Performed: Procedure(s): Incision and drainage of lumbar wound (N/A)  Patient Location: PACU  Anesthesia Type:General  Level of Consciousness: awake  Airway & Oxygen Therapy: Patient Spontanous Breathing and Patient connected to nasal cannula oxygen  Post-op Assessment: Report given to PACU RN and Patient moving all extremities X 4  Post vital signs: Reviewed and stable  Complications: No apparent anesthesia complications

## 2014-03-22 NOTE — Progress Notes (Signed)
Patient ID: Marissa Parsons, female   DOB: October 14, 1928, 80 y.o.   MRN: [id number] Subjective: Patient reports some back soreness, states "i'm doing well," no leg pain  Objective: Vital signs in last 24 hours: Temp:  [98.9 F (37.2 C)-102.8 F (39.3 C)] 101.4 F (38.6 C) (06/26 0547) Pulse Rate:  [63-89] 74 (06/26 0547) Resp:  [16-24] 20 (06/26 0547) BP: (85-123)/(29-87) 98/87 mmHg (06/26 0547) SpO2:  [89 %-99 %] 93 % (06/26 0547)  Intake/Output from previous day: 06/25 0701 - 06/26 0700 In: 1170 [P.O.:120; I.V.:800; IV Piggyback:250] Out: 56 [Urine:1; Drains:55] Intake/Output this shift:    Neurologic: Grossly normal, good strength, dressing dry, non-toxic, awake/ alert  Lab Results: Lab Results  Component Value Date   WBC 15.2* 03/21/2014   HGB 11.4* 03/21/2014   HCT 35.3* 03/21/2014   MCV 106.6* 03/21/2014   PLT 225 03/21/2014   Lab Results  Component Value Date   INR 1.02 03/04/2014   BMET Lab Results  Component Value Date   NA 140 03/21/2014   K 3.5* 03/21/2014   CL 102 03/21/2014   CO2 27 03/21/2014   GLUCOSE 92 03/21/2014   BUN 16 03/21/2014   CREATININE 0.67 03/21/2014   CALCIUM 9.2 03/21/2014    Studies/Results: Dg Chest 2 View  03/20/2014   CLINICAL DATA:  Productive cough.  EXAM: CHEST  2 VIEW  COMPARISON:  07/26/2012  FINDINGS: Heart size and pulmonary vascularity are normal. There is chronic or recurrent peribronchial thickening. The lungs are hyperinflated with an appearance suggestive of emphysema. No acute osseous abnormality.  IMPRESSION: Emphysema.  Recurrent or chronic bronchitic changes.   Electronically Signed   By: Geanie Cooley [name]   On: 03/20/2014 21:26   Ct Angio Chest Pe W/cm &/or Wo Cm  03/21/2014   CLINICAL DATA:  Shortness of breath after laminectomy.  EXAM: CT ANGIOGRAPHY CHEST WITH CONTRAST  TECHNIQUE: Multidetector CT imaging of the chest was performed using the standard protocol during bolus administration of intravenous contrast. Multiplanar CT  image reconstructions and MIPs were obtained to evaluate the vascular anatomy.  CONTRAST:  OMNIPAQUE IOHEXOL 350 MG/ML SOLN  COMPARISON:  07/26/2012  FINDINGS: Technically adequate study with good opacification of the central and segmental pulmonary arteries. No focal filling defects are identified. No evidence of significant pulmonary embolus.  Normal heart size. Normal caliber thoracic aorta. Calcification in the coronary arteries, aortic valve, and aorta. No significant lymphadenopathy in the chest. Esophagus is decompressed. Multiple nodular enlargement of the thyroid gland, similar to prior study. Atelectasis or consolidation in the right lung base. Diffuse emphysematous changes in the lungs. No pneumothorax. No pleural effusions. Visualized portions of the upper abdominal organs are grossly unremarkable. Degenerative changes in the thoracic spine. Old fracture deformity of the right shoulder.  Review of the MIP images confirms the above findings.  IMPRESSION: No evidence of significant pulmonary embolus. Atelectasis or consolidation in the right lung base.   Electronically Signed   By: Burman Nieves [name]   On: 03/21/2014 03:05   Mr Lumbar Spine W Wo Contrast  03/20/2014   CLINICAL DATA:  fever, low back pain, recent surgery of lumbar  EXAM: MRI LUMBAR SPINE WITHOUT AND WITH CONTRAST  TECHNIQUE: Multiplanar and multiecho pulse sequences of the lumbar spine were obtained without and with intravenous contrast.  CONTRAST:  10mL MULTIHANCE GADOBENATE DIMEGLUMINE 529 MG/ML IV SOLN  COMPARISON:  Prior MRI from 03/03/2014.  FINDINGS: For the purposes of this dictation, the lowest well-formed intervertebral disc spaces  presumed to be the L5-S1 level, and there presumed to be 5 lumbar type vertebral bodies.  Vertebral bodies are normally aligned with preservation of the normal lumbar lordosis. Vertebral body heights are preserved. No acute fracture or or listhesis. Signal intensity within the vertebral body  bone marrow is within normal limits. No evidence of osteomyelitis discitis.  Signal intensity within the visualized spinal cord is within normal limits. Conus medullaris terminates at the L1 level.  Postsurgical changes from interval decompressive laminectomy and facetectomy at L3-4, L4-5, and L5-S1 is seen. Heterogeneous signal intensity seen within the paraspinous soft tissues at the laminectomy site, compatible with postoperative changes. A loculated fluid collection measuring approximately 6.3 x 5.6 x 2.4 cm seen along the midline incision (series 10, image 10). Mild postcontrast enhancement seen about this collection. While this finding may represent a postoperative seroma, possible infection is not excluded. This collection abuts the dorsal aspect of the thecal sac at the L3 and L4 levels along the laminectomy site. No epidural abscess.  L1-2: Shallow central disc protrusion again noted without significant stenosis.  L2-3: Mild facet hypertrophy again noted bilaterally. There is mild diffuse degenerative disc bulge. No significant stenosis.  L3-4: Postoperative changes from prior decompressive laminectomy and medial facetectomy present. Broad-based disc bulging. No previously seen bilateral lateral recess stenosis is slightly improved. Mild bilateral foraminal narrowing is stable.  L4-5: Sequelae of postoperative decompressive laminectomy and medial facetectomy. Previously seen severe central canal stenosis is markedly improved. Moderate bilateral foraminal narrowing also improved, 0. There is persistent mild bilateral foraminal stenosis at this level.  L5-S1: Postoperative changes from decompressive laminectomy present. Broad-based degenerative disc bulging again seen. Previously seen moderate central canal stenosis is improved as is bilateral lateral recess narrowing. Mild bilateral foraminal stenosis is stable.  Visualized visceral structures are unremarkable.  IMPRESSION: 1. Postoperative changes from  interval decompressive laminectomy and medial facetectomy at L3-4, L4-5, and L5-S1. 2. Loculated fluid collection measuring approximately 6.3 x 5.6 x 2.4 cm along the midline incision with mild peripheral post-contrast enhancement. While this finding may reflect postoperative seroma, superimposed infection is not excluded. Clinical correlation recommended. No epidural abscess or evidence of osteomyelitis discitis seen elsewhere within the lumbar spine. 3. Interval improvement in central canal stenosis at L3 through S1 status post spinal decompression. 4. Additional degenerative changes as above.   Electronically Signed   By: Rise Mu [name]   On: 03/20/2014 22:46    Assessment/Plan:  Looks good clinically, awaiting cultures, continue Vanco/ rocephin   LOS: 2 days    Wayden Schwertner S 03/22/2014, 7:34 AM

## 2014-03-23 MED ORDER — ALBUTEROL SULFATE (2.5 MG/3ML) 0.083% IN NEBU
2.5000 mg | INHALATION_SOLUTION | Freq: Two times a day (BID) | RESPIRATORY_TRACT | Status: DC
  Administered 2014-03-23 – 2014-03-24 (×3): 2.5 mg via RESPIRATORY_TRACT
  Filled 2014-03-23 (×4): qty 3

## 2014-03-23 NOTE — Progress Notes (Signed)
Patient ID: Marissa Parsons, female   DOB: [birth year], 80 y.o.   MRN: [id number] Subjective:  Patient is alert and pleasant. Her back is sore. She is in no apparent distress.  Objective: Vital signs in last 24 hours: Temp:  [97.4 F (36.3 C)-99.7 F (37.6 C)] 98.4 F (36.9 C) (06/27 0521) Pulse Rate:  [62-85] 82 (06/27 0521) Resp:  [16-18] 18 (06/27 0521) BP: (81-126)/(39-65) 115/65 mmHg (06/27 0521) SpO2:  [94 %-100 %] 96 % (06/27 0521)  Intake/Output from previous day: 06/26 0701 - 06/27 0700 In: -  Out: 10 [Drains:10] Intake/Output this shift:    Physical exam the patient is alert and oriented. Her strength is normal.  Lab Results:  Recent Labs  03/20/14 2000 03/21/14 0415  WBC 16.5* 15.2*  HGB 12.6 11.4*  HCT 38.5 35.3*  PLT 261 225   BMET  Recent Labs  03/20/14 2000 03/21/14 0415  NA 139 140  K 3.4* 3.5*  CL 98 102  CO2 26 27  GLUCOSE 135* 92  BUN 22 16  CREATININE 0.79 0.67  CALCIUM 10.1 9.2    Studies/Results: Dg Chest 2 View  03/22/2014   CLINICAL DATA:  Shortness of breath  EXAM: CHEST  2 VIEW  COMPARISON:  CT chest 03/21/2014  FINDINGS: Right basilar airspace disease. There is no other focal parenchymal opacity, pleural effusion, or pneumothorax. The heart and mediastinal contours are unremarkable.  Posttraumatic deformity of the right humeral head.  IMPRESSION: Right basilar airspace disease which may reflect atelectasis versus pneumonia.   Electronically Signed   By: Elige Ko   On: 03/22/2014 10:24    Assessment/Plan: Postop day #3: Wound infection, he will await cultures, continue antibiotics, and place a PICC line. She will need home IV antibiotics.  LOS: 3 days     JENKINS,JEFFREY D 03/23/2014, 9:10 AM

## 2014-03-23 NOTE — Progress Notes (Signed)
Subjective: She feels worn out with mild dyspnea and mildly productive cough, decreased appetite  Objective: Vital signs in last 24 hours: Temp:  [97.5 F (36.4 C)-99.7 F (37.6 C)] 97.5 F (36.4 C) (06/27 1010) Pulse Rate:  [73-85] 79 (06/27 1010) Resp:  [18] 18 (06/27 1010) BP: (99-126)/(41-65) 114/51 mmHg (06/27 1010) SpO2:  [94 %-100 %] 98 % (06/27 1010) Weight change:    Intake/Output from previous day: 06/26 0701 - 06/27 0700 In: -  Out: 10 [Drains:10]   General appearance: alert, cooperative, mild distress and mild dyspnea with conversation Resp: decreased breath sounds right base Cardio: regular rate and rhythm and with a 2/6 SEM GI: soft, non-tender; bowel sounds normal; no masses,  no organomegaly Extremities: extremities normal, atraumatic, no cyanosis or edema  Lab Results:  Recent Labs  03/20/14 2000 03/21/14 0415  WBC 16.5* 15.2*  HGB 12.6 11.4*  HCT 38.5 35.3*  PLT 261 225   BMET  Recent Labs  03/20/14 2000 03/21/14 0415  NA 139 140  K 3.4* 3.5*  CL 98 102  CO2 26 27  GLUCOSE 135* 92  BUN 22 16  CREATININE 0.79 0.67  CALCIUM 10.1 9.2   CMET CMP     Component Value Date/Time   NA 140 03/21/2014 0415   K 3.5* 03/21/2014 0415   CL 102 03/21/2014 0415   CO2 27 03/21/2014 0415   GLUCOSE 92 03/21/2014 0415   BUN 16 03/21/2014 0415   CREATININE 0.67 03/21/2014 0415   CALCIUM 9.2 03/21/2014 0415   PROT 6.3 03/20/2014 2000   ALBUMIN 3.2* 03/20/2014 2000   AST 65* 03/20/2014 2000   ALT 30 03/20/2014 2000   ALKPHOS 57 03/20/2014 2000   BILITOT 0.4 03/20/2014 2000   GFRNONAA 79* 03/21/2014 0415   GFRAA >90 03/21/2014 0415    CBG (last 3)  No results found for this basename: GLUCAP,  in the last 72 hours  INR RESULTS:   Lab Results  Component Value Date   INR 1.02 03/04/2014   INR 1.06 01/16/2013   INR 2.4 01/03/2013     Studies/Results: Dg Chest 2 View  03/22/2014   CLINICAL DATA:  Shortness of breath  EXAM: CHEST  2 VIEW  COMPARISON:  CT chest  03/21/2014  FINDINGS: Right basilar airspace disease. There is no other focal parenchymal opacity, pleural effusion, or pneumothorax. The heart and mediastinal contours are unremarkable.  Posttraumatic deformity of the right humeral head.  IMPRESSION: Right basilar airspace disease which may reflect atelectasis versus pneumonia.   Electronically Signed   By: Elige Ko   On: 03/22/2014 10:24    Medications: I have reviewed the patient's current medications.  Assessment/Plan: #1 Dyspnea: mild and likely due to right base pneumonia versus COPD exacerbation. Cardiac source such as aortic stenosis less likely. Will make sure antibiotic coverage is broad, add nebulizer treatment, and recheck her echocardiogram (showed normal LV function and mild to moderate AS in 2013)   LOS: 3 days   PATERSON,DANIEL G 03/23/2014, 11:41 AM

## 2014-03-24 ENCOUNTER — Inpatient Hospital Stay (HOSPITAL_COMMUNITY): Payer: Medicare HMO

## 2014-03-24 DIAGNOSIS — I059 Rheumatic mitral valve disease, unspecified: Secondary | ICD-10-CM

## 2014-03-24 LAB — WOUND CULTURE

## 2014-03-24 LAB — PRO B NATRIURETIC PEPTIDE: PRO B NATRI PEPTIDE: 2706 pg/mL — AB (ref 0–450)

## 2014-03-24 MED ORDER — POTASSIUM CHLORIDE CRYS ER 20 MEQ PO TBCR
20.0000 meq | EXTENDED_RELEASE_TABLET | Freq: Every day | ORAL | Status: DC
  Administered 2014-03-24 – 2014-03-27 (×4): 20 meq via ORAL
  Filled 2014-03-24 (×4): qty 1

## 2014-03-24 MED ORDER — SODIUM CHLORIDE 0.9 % IJ SOLN
10.0000 mL | INTRAMUSCULAR | Status: DC | PRN
  Administered 2014-03-25 – 2014-03-26 (×2): 10 mL

## 2014-03-24 MED ORDER — ALBUTEROL SULFATE (2.5 MG/3ML) 0.083% IN NEBU: 2.5000 mg | INHALATION_SOLUTION | Freq: Three times a day (TID) | RESPIRATORY_TRACT | Status: DC | PRN

## 2014-03-24 MED ORDER — SODIUM CHLORIDE 0.9 % IJ SOLN
10.0000 mL | Freq: Two times a day (BID) | INTRAMUSCULAR | Status: DC
  Administered 2014-03-24 – 2014-03-27 (×4): 10 mL

## 2014-03-24 MED ORDER — METOPROLOL TARTRATE 25 MG PO TABS
25.0000 mg | ORAL_TABLET | Freq: Two times a day (BID) | ORAL | Status: DC
  Administered 2014-03-24 – 2014-03-27 (×7): 25 mg via ORAL
  Filled 2014-03-24 (×7): qty 1

## 2014-03-24 MED ORDER — POTASSIUM CHLORIDE 20 MEQ PO PACK: 20.0000 meq | PACK | Freq: Every day | ORAL | Status: DC

## 2014-03-24 NOTE — Progress Notes (Signed)
Hemovac removed from posterior back and dressing change performed. Serosanguinous drainage noted and measured at 4ml in cylinder. No drainage noted at tube removal site. Pt tolerated well and had no c/o pain during or after treatment. VB, SN-GTCC

## 2014-03-24 NOTE — Progress Notes (Signed)
Patient ID: Marissa Parsons, female   DOB: Jun 15, 1929, 80 y.o.   MRN: [id number] Subjective:  Patient is alert and pleasant. She is in no apparent distress. She looks well.  Objective: Vital signs in last 24 hours: Temp:  [97.5 F (36.4 C)-98.3 F (36.8 C)] 98.3 F (36.8 C) (06/28 0943) Pulse Rate:  [78-113] 87 (06/28 0943) Resp:  [18-20] 20 (06/28 0943) BP: (110-144)/(48-61) 144/61 mmHg (06/28 0943) SpO2:  [95 %-98 %] 97 % (06/28 0943)  Intake/Output from previous day: 06/27 0701 - 06/28 0700 In: -  Out: 18 [Urine:3; Drains:15] Intake/Output this shift: Total I/O In: -  Out: 1 [Urine:1]  Physical exam the patient is alert and oriented. She is moving her lower extremities well. Her dressing has some discharge on it.  Lab Results: No results found for this basename: WBC, HGB, HCT, PLT,  in the last 72 hours BMET No results found for this basename: NA, K, CL, CO2, GLUCOSE, BUN, CREATININE, CALCIUM,  in the last 72 hours  Studies/Results: Dg Chest 2 View  03/22/2014   CLINICAL DATA:  Shortness of breath  EXAM: CHEST  2 VIEW  COMPARISON:  CT chest 03/21/2014  FINDINGS: Right basilar airspace disease. There is no other focal parenchymal opacity, pleural effusion, or pneumothorax. The heart and mediastinal contours are unremarkable.  Posttraumatic deformity of the right humeral head.  IMPRESSION: Right basilar airspace disease which may reflect atelectasis versus pneumonia.   Electronically Signed   By: Elige Ko   On: 03/22/2014 10:24    Assessment/Plan: Postop day #4: We will remove the patient's Hemovac drain and change her dressing. Her cultures have grown out staph aureus. I will asked infectious disease to see the patient tomorrow to help US guide [place] with the length of antibiotic treatment and which antibiotics. We are still awaiting a PICC line.  LOS: 4 days     Manoah Deckard D 03/24/2014, 9:46 AM

## 2014-03-24 NOTE — Progress Notes (Signed)
Peripherally Inserted Central Catheter/Midline Placement  The IV Nurse has discussed with the patient and/or persons authorized to consent for the patient, the purpose of this procedure and the potential benefits and risks involved with this procedure.  The benefits include less needle sticks, lab draws from the catheter and patient may be discharged home with the catheter.  Risks include, but not limited to, infection, bleeding, blood clot (thrombus formation), and puncture of an artery; nerve damage and irregular heat beat.  Alternatives to this procedure were also discussed.  PICC/Midline Placement Documentation  PICC / Midline Single Lumen 03/24/14 PICC Right Basilic 36 cm 0 cm (Active)  Indication for Insertion or Continuance of Line Home intravenous therapies (PICC only) 03/24/2014  2:23 PM  Exposed Catheter (cm) 0 cm 03/24/2014  2:23 PM  Line Status Flushed;Saline locked;Blood return noted 03/24/2014  2:23 PM  Dressing Change Due 03/31/14 03/24/2014  2:23 PM       Ethelda Chick 03/24/2014, 2:24 PM

## 2014-03-24 NOTE — Progress Notes (Signed)
Pt having periods of confusion.  When asked neurological questions (where are you, date, birthday, president, etc) she is correct in her answers but continues to talk about going to the kitchen for coffee, staff put her picc line in the wrong person (she knows she got one but she insists that someone else got one too using her name).  Will continue to monitor. Sondra Come, RN 03/24/2014

## 2014-03-24 NOTE — Progress Notes (Signed)
Clinical Child psychotherapist (CSW) received consult for SNF placement. PT is recommending home health with 24 hour assistance. Patient reported that she lives with her nephew Casimiro Needle and her neighbor and brother will arrange 24 hour care at home for her. CSW made RN case manager aware. Please reconsult if further social work needs arise. CSW signing off.   Jetta Lout, LCSWA Weekend CSW [phone]

## 2014-03-24 NOTE — Progress Notes (Signed)
Subjective: Dyspnea is improved with adding albuterol nebs, appetite is good  Objective: Vital signs in last 24 hours: Temp:  [98 F (36.7 C)-98.3 F (36.8 C)] 98.3 F (36.8 C) (06/28 0943) Pulse Rate:  [78-113] 87 (06/28 0943) Resp:  [18-20] 20 (06/28 0943) BP: (110-144)/(48-61) 144/61 mmHg (06/28 0943) SpO2:  [95 %-97 %] 96 % (06/28 1013) Weight change:    Intake/Output from previous day: 06/27 0701 - 06/28 0700 In: -  Out: 18 [Urine:3; Drains:15]   General appearance: alert, cooperative and no distress Resp: clear to auscultation bilaterally Cardio: regular rate and rhythm and with a 2/6 SEM GI: soft, non-tender; bowel sounds normal; no masses,  no organomegaly Extremities: extremities normal, atraumatic, no cyanosis or edema  Lab Results: No results found for this basename: WBC, HGB, HCT, PLT,  in the last 72 hours BMET No results found for this basename: NA, K, CL, CO2, GLUCOSE, BUN, CREATININE, CALCIUM,  in the last 72 hours CMET CMP     Component Value Date/Time   NA 140 03/21/2014 0415   K 3.5* 03/21/2014 0415   CL 102 03/21/2014 0415   CO2 27 03/21/2014 0415   GLUCOSE 92 03/21/2014 0415   BUN 16 03/21/2014 0415   CREATININE 0.67 03/21/2014 0415   CALCIUM 9.2 03/21/2014 0415   PROT 6.3 03/20/2014 2000   ALBUMIN 3.2* 03/20/2014 2000   AST 65* 03/20/2014 2000   ALT 30 03/20/2014 2000   ALKPHOS 57 03/20/2014 2000   BILITOT 0.4 03/20/2014 2000   GFRNONAA 79* 03/21/2014 0415   GFRAA >90 03/21/2014 0415    CBG (last 3)  No results found for this basename: GLUCAP,  in the last 72 hours  INR RESULTS:   Lab Results  Component Value Date   INR 1.02 03/04/2014   INR 1.06 01/16/2013   INR 2.4 01/03/2013     Studies/Results: No results found.  Medications: I have reviewed the patient's current medications.  Assessment/Plan: #1 Dyspnea: improved and likely due to RLL pneumonia versus COPD. Will check results of echo when available   LOS: 4 days   PATERSON,DANIEL  G 03/24/2014, 11:38 AM

## 2014-03-24 NOTE — Progress Notes (Signed)
Echo Lab  2D Echocardiogram completed.  Eathen Budreau L Thersa Mohiuddin, RDCS 03/24/2014 10:09 AM   

## 2014-03-24 NOTE — Progress Notes (Signed)
Occupational Therapy Treatment Patient Details Name: Marissa Parsons MRN: [id number] DOB: 1929-07-19 Today's Date: 03/24/2014    History of present illness Pt is an 80 y/o female admitted with nausea/vomiting and fever. Patient recently underwent a decompressive lumbar laminectomy on 6/7 by Dr. Marikay Alar. She tolerated the procedure well and was progressing very well at home, ambulating with a walker and able to do some minor household chores. The afternoon of admission she developed sudden onset of nausea and vomiting associated with fever stating she felt like she was "burning up". She had several episodes of emesis which prompted her to come to the emergency department. She was evaluated and found to have a temperature 103.3 with O2 sats in the 80's.    OT comments  Cues during session to try to stand upright and for back precautions. Education provided. Continue to recommend HHOT and 24 hour assistance upon d/c.  Follow Up Recommendations  Home health OT;Supervision/Assistance - 24 hour    Equipment Recommendations  3 in 1 bedside comode    Recommendations for Other Services      Precautions / Restrictions Precautions Precautions: Back;Fall Precaution Booklet Issued: No Precaution Comments: Reviewed precautions with pt Restrictions Weight Bearing Restrictions: No       Mobility Bed Mobility Overal bed mobility: Needs Assistance Bed Mobility: Rolling;Sidelying to Sit;Sit to Sidelying Rolling: Min assist;Supervision Sidelying to sit: Mod assist     Sit to sidelying: Mod assist General bed mobility comments: Cues for log roll technique.  Assistance with trunk and LE's when going from sidelying to sitting position.   Transfers Overall transfer level: Needs assistance Equipment used: Rolling walker (2 wheeled) Transfers: Sit to/from Stand Sit to Stand: Min guard;Min assist         General transfer comment: Cues for technique.    Balance                                   ADL Overall ADL's : Needs assistance/impaired     Grooming: Oral care;Brushing hair;Sitting;Standing;Min guard       Lower Body Bathing: Min guard (washed peri area/buttocks standing) Lower Body Bathing Details (indicate cue type and reason): pt not maintaining precautions-educated         Toilet Transfer: Min guard;Ambulation;RW;Comfort height toilet;Grab bars   Toileting- Clothing Manipulation and Hygiene: Min guard (sitting/standing)       Functional mobility during ADLs: Min guard;Rolling walker General ADL Comments: Discussed using long handled sponge to wash peri area as pt appeared to be breaking back precautions. Educated on use of cup for teeth care. Educated on toilet aid for hygiene. Discussed safety (rugs, bag on walker). Pt ambulated in room.      Vision                     Perception     Praxis      Cognition   Behavior During Therapy: [facility] for tasks assessed/performed Overall Cognitive Status: Within Functional Limits for tasks assessed                       Extremity/Trunk Assessment               Exercises     Shoulder Instructions       General Comments      Pertinent Vitals/ Pain       No pain reported.   Home  Living                                          Prior Functioning/Environment              Frequency Min 2X/week     Progress Toward Goals  OT Goals(current goals can now be found in the care plan section)  Progress towards OT goals: Progressing toward goals  Acute Rehab OT Goals Patient Stated Goal: to go home OT Goal Formulation: With patient Time For Goal Achievement: 03/29/14 Potential to Achieve Goals: Good  Plan Discharge plan remains appropriate    Co-evaluation                 End of Session Equipment Utilized During Treatment: Gait belt;Rolling walker   Activity Tolerance Patient limited by fatigue   Patient Left in bed;with call  bell/phone within reach;with bed alarm set   Nurse Communication          Time: 8295-6213 OT Time Calculation (min): 15 min  Charges: OT General Charges $OT Visit: 1 Procedure OT Treatments $Self Care/Home Management : 8-22 mins  Earlie Raveling OTR/L [phone] 03/24/2014, 11:56 AM

## 2014-03-25 LAB — COMPREHENSIVE METABOLIC PANEL
ALT: 32 U/L (ref 0–35)
AST: 71 U/L — AB (ref 0–37)
Albumin: 2.4 g/dL — ABNORMAL LOW (ref 3.5–5.2)
Alkaline Phosphatase: 52 U/L (ref 39–117)
BUN: 6 mg/dL (ref 6–23)
CHLORIDE: 104 meq/L (ref 96–112)
CO2: 24 mEq/L (ref 19–32)
Calcium: 9.1 mg/dL (ref 8.4–10.5)
Creatinine, Ser: 0.5 mg/dL (ref 0.50–1.10)
GFR calc Af Amer: >90 mL/min (ref >90)
GFR, EST NON AFRICAN AMERICAN: 87 mL/min — AB (ref >90)
Glucose, Bld: 83 mg/dL (ref 70–99)
Potassium: 4 mEq/L (ref 3.7–5.3)
Sodium: 140 mEq/L (ref 137–147)
Total Bilirubin: 0.4 mg/dL (ref 0.3–1.2)
Total Protein: 5.7 g/dL — ABNORMAL LOW (ref 6.0–8.3)

## 2014-03-25 LAB — CBC WITH DIFFERENTIAL/PLATELET
BASOS ABS: 0 10*3/uL (ref 0.0–0.1)
Basophils Relative: 0 % (ref 0–1)
Eosinophils Absolute: 0.2 10*3/uL (ref 0.0–0.7)
Eosinophils Relative: 2 % (ref 0–5)
HEMATOCRIT: 33.3 % — AB (ref 36.0–46.0)
Hemoglobin: 10.9 g/dL — ABNORMAL LOW (ref 12.0–15.0)
LYMPHS PCT: 24 % (ref 12–46)
Lymphs Abs: 1.9 10*3/uL (ref 0.7–4.0)
MCH: 34.9 pg — ABNORMAL HIGH (ref 26.0–34.0)
MCHC: 32.7 g/dL (ref 30.0–36.0)
MCV: 106.7 fL — ABNORMAL HIGH (ref 78.0–100.0)
MONO ABS: 0.9 10*3/uL (ref 0.1–1.0)
Monocytes Relative: 11 % (ref 3–12)
NEUTROS ABS: 4.9 10*3/uL (ref 1.7–7.7)
Neutrophils Relative %: 63 % (ref 43–77)
Platelets: 210 10*3/uL (ref 150–400)
RBC: 3.12 MIL/uL — AB (ref 3.87–5.11)
RDW: 14.4 % (ref 11.5–15.5)
WBC: 7.8 10*3/uL (ref 4.0–10.5)

## 2014-03-25 LAB — TSH: TSH: 4.85 u[IU]/mL — ABNORMAL HIGH (ref 0.350–4.500)

## 2014-03-25 LAB — VITAMIN B12: Vitamin B-12: 432 pg/mL (ref 211–911)

## 2014-03-25 MED ORDER — ADULT MULTIVITAMIN W/MINERALS CH
1.0000 | ORAL_TABLET | Freq: Every day | ORAL | Status: DC
  Administered 2014-03-25 – 2014-03-27 (×3): 1 via ORAL
  Filled 2014-03-25 (×3): qty 1

## 2014-03-25 MED ORDER — BOOST PLUS PO LIQD
237.0000 mL | Freq: Three times a day (TID) | ORAL | Status: DC
  Administered 2014-03-25 – 2014-03-27 (×7): 237 mL via ORAL
  Filled 2014-03-25 (×6): qty 237
  Filled 2014-03-25: qty 1
  Filled 2014-03-25 (×3): qty 237
  Filled 2014-03-25: qty 1

## 2014-03-25 MED ORDER — LEVOTHYROXINE SODIUM 50 MCG PO TABS
75.0000 ug | ORAL_TABLET | Freq: Every day | ORAL | Status: DC
  Administered 2014-03-25 – 2014-03-27 (×3): 75 ug via ORAL
  Filled 2014-03-25 (×6): qty 1

## 2014-03-25 MED ORDER — POLYETHYLENE GLYCOL 3350 17 G PO PACK
17.0000 g | PACK | Freq: Every day | ORAL | Status: DC
  Administered 2014-03-25 – 2014-03-27 (×3): 17 g via ORAL
  Filled 2014-03-25 (×3): qty 1

## 2014-03-25 NOTE — Progress Notes (Signed)
Clinical Social Work Department CLINICAL SOCIAL WORK PLACEMENT NOTE 03/25/2014  Patient:  Marissa Parsons, Marissa Parsons  Account Number:  [id number] Admit date:  03/20/2014  Clinical Social Worker:  Maryclare Labrador, Theresia Majors  Date/time:  03/25/2014 03:24 PM  Clinical Social Work is seeking post-discharge placement for this patient at the following level of care:   SKILLED NURSING   (*CSW will update this form in Epic as items are completed)   N/A-pt requested [facility]  Patient/family provided with [facility] Department of Clinical Social Work's list of facilities offering this level of care within the geographic area requested by the patient (or if unable, by the patient's family).  03/25/2014  Patient/family informed of their freedom to choose among providers that offer the needed level of care, that participate in Medicare, Medicaid or managed care program needed by the patient, have an available bed and are willing to accept the patient.  N/A-not sent to this facility  Patient/family informed of [facility]' ownership interest in [facility], as well as of the fact that they are under no obligation to receive care at this facility.  PASARR submitted to [employer] on EXISTING PASARR number received on EXISTING  FL2 transmitted to all facilities in geographic area requested by pt/family on  03/25/2014 FL2 transmitted to all facilities within larger geographic area on   Patient informed that his/her managed care company has contracts with or will negotiate with  certain facilities, including the following:     Patient/family informed of bed offers received:   Patient chooses bed at  Physician recommends and patient chooses bed at    Patient to be transferred to  on   Patient to be transferred to facility by  Patient and family notified of transfer on  Name of family member notified:    The following physician request were entered in Epic:   Additional Comments: [facility] offered a  bed, but will need insurance auth from [place] before they can accept pt.   Maryclare Labrador, MSW, [facility] Clinical Social Worker [phone]

## 2014-03-25 NOTE — Progress Notes (Signed)
INITIAL NUTRITION ASSESSMENT  DOCUMENTATION CODES Per approved criteria  -Not Applicable   INTERVENTION: Provide Boost Plus BID Provide Multivitamin with minerals daily Encourage PO intake  NUTRITION DIAGNOSIS: Inadequate oral intake related to poor appetite as evidenced by pt's report.   Goal: Pt to meet >/= 90% of their estimated nutrition needs   Monitor:  PO intake, weight trend, labs  Reason for Assessment: Consult (poor PO intake, low albumin, protein calorie malnutrition)  80 y.o. female  Admitting Dx: Wound infection after surgery  ASSESSMENT: 80 year old white female with a history of rheumatoid arthritis anemia suppressant therapy, spinal stenosis status post recent lumbar laminectomy (6/7) who presented to the emergency department with the complaint of nausea/vomiting and fever. She came to the emergency department where she's been evaluated and found to have a temperature 103.3.   Pt states that she doesn't have much of an appetite but, this is normal for her. She reports that she typically eats 2 meals daily; a small bowl of cereal for breakfast and a half a sandwich for her second meal. Pt reports that she usually weighs 132 lbs. Pt also reports drinking one Boost supplement daily PTA. RD encouraged pt to eat 3 times daily and to increase Boost Plus twice daily. Emphasized the importance of eating protein-rich foods with each meal. Reviewed protein-rich foods with patient.   Weight history shows pt has lost 2% of her body weight in the past 3 weeks and 6% in the past 11 months- wt loss not significant for time frame.  Labs and medications reviewed.   Height: Ht Readings from Last 1 Encounters:  03/21/14 5\' 7"  (1.702 m)    Weight: Wt Readings from Last 1 Encounters:  03/21/14 129 lb 10.1 oz (58.8 kg)    Ideal Body Weight: 135 lbs  % Ideal Body Weight: 96%  Wt Readings from Last 10 Encounters:  03/21/14 129 lb 10.1 oz (58.8 kg)  03/21/14 129 lb 10.1 oz  (58.8 kg)  03/03/14 131 lb 8 oz (59.648 kg)  03/03/14 131 lb 8 oz (59.648 kg)  04/30/13 138 lb (62.596 kg)  11/16/12 148 lb 12.8 oz (67.495 kg)  08/07/12 149 lb (67.586 kg)  07/26/12 157 lb 10.1 oz (71.5 kg)  04/04/12 133 lb 9.6 oz (60.601 kg)  12/03/11 158 lb (71.668 kg)    Usual Body Weight: 132 lbs  % Usual Body Weight: 98%  BMI:  Body mass index is 20.3 kg/(m^2).  Estimated Nutritional Needs: Kcal: 1470-1650 Protein: 75-85 grams Fluid: 1.4 L/day  Skin: closed incision on back  Diet Order: General  EDUCATION NEEDS: -No education needs identified at this time   Intake/Output Summary (Last 24 hours) at 03/25/14 1501 Last data filed at 03/25/14 1244  Gross per 24 hour  Intake    360 ml  Output      3 ml  Net    357 ml    Last BM: 6/25  Labs:   Recent Labs Lab 03/20/14 2000 03/21/14 0415 03/25/14 0405  NA 139 140 140  K 3.4* 3.5* 4.0  CL 98 102 104  CO2 26 27 24   BUN 22 16 6   CREATININE 0.79 0.67 0.50  CALCIUM 10.1 9.2 9.1  GLUCOSE 135* 92 83    CBG (last 3)  No results found for this basename: GLUCAP,  in the last 72 hours  Scheduled Meds: . aspirin EC  81 mg Oral Daily  . cefTRIAXone (ROCEPHIN)  IV  1 g Intravenous Q24H  . diltiazem  240 mg Oral Daily  . docusate sodium  100 mg Oral BID  . folic acid  1 mg Oral Daily  . levothyroxine  75 mcg Oral QAC breakfast  . metoprolol tartrate  25 mg Oral BID  . polyethylene glycol  17 g Oral Daily  . potassium chloride  20 mEq Oral Daily  . predniSONE  5 mg Oral Q breakfast  . sodium chloride  10-40 mL Intracatheter Q12H  . sodium chloride  3 mL Intravenous Q12H  . vancomycin  500 mg Intravenous Q12H    Continuous Infusions: . sodium chloride    . 0.9 % NaCl with KCl 20 mEq / L 50 mL/hr at 03/24/14 1814    Past Medical History  Diagnosis Date  . Hypertension   . Rheumatoid arteritis   . Phlebitis   . Deep vein thrombophlebitis of leg   . Fractured pelvis     fall 2011  . Thyroid disease      hypothyroidism  . Humerus fracture   . Thrombocytopenia   . Leukocytosis   . Hypothyroidism   . Phlebitis      Recurrent phlebitis  . Hematoma      Right superior and inferior pubic rami fractures with hematoma adjacent to the right ramus fracture  . Urinary tract infection   . Humerus fracture      Right proximal humerus fracture  . Osteoarthritis     for which she takes chronic prednisone  . Macular degeneration   . Chronic anticoagulation   . Fracture of multiple pubic rami 01/16/2013  . Dementia     MILD    Past Surgical History  Procedure Laterality Date  . Left hip open reduction    . Cholecystectomy    . Abdominal hysterectomy  s  . Sinus surgery with instatrak    . Lumbar laminectomy/decompression microdiscectomy N/A 03/06/2014    Procedure: LUMBAR LAMINECTOMY/DECOMPRESSION LUMBAR THREE, FOUR, AND FIVE ;  Surgeon: Tia Alert, MD;  Location: [facility];  Service: Neurosurgery;  Laterality: N/A;  LUMBAR LAMINECTOMY/DECOMPRESSION LUMBAR THREE, FOUR, AND FIVE     Ian Malkin RD, LDN Inpatient Clinical Dietitian Pager: [phone] After Hours Pager: [phone]

## 2014-03-25 NOTE — Progress Notes (Signed)
[facility]  Patient Status:  Active pt with [facility] prior to this admission    [facility] is providing the following services:   HHRN and [facility] for home IV ABX.  [facility] will support transition home when deemed appropriate.   If patient discharges after hours, please call [phone].   Sedalia Muta 03/25/2014, 5:34 PM

## 2014-03-25 NOTE — Progress Notes (Signed)
Physical Therapy Treatment Patient Details Name: Marissa Parsons MRN: [id number] DOB: [birth year] Today's Date: 03/25/2014    History of Present Illness Pt readmitted with infection at site of recent lumbar laminectomy.  ABX therapy in pprocess.    PT Comments    Pt needed reinforcement of back education as she had already built up bad habits.  Otherwise progressing steadily.  Follow Up Recommendations  SNF     Equipment Recommendations  None recommended by PT    Recommendations for Other Services       Precautions / Restrictions Precautions Precautions: Back;Fall Precaution Booklet Issued: No Precaution Comments: Reviewed precautions with pt    Mobility  Bed Mobility Overal bed mobility: Needs Assistance Bed Mobility: Rolling;Sidelying to Sit Rolling: Min guard (with rail) Sidelying to sit: Min guard       General bed mobility comments: reinforced log rol and best technique to sit.  heavy use of the rail  Transfers Overall transfer level: Needs assistance Equipment used: Rolling walker (2 wheeled) Transfers: Sit to/from Stand Sit to Stand: Min guard         General transfer comment: cues for safety and to curb impulsiveness  Ambulation/Gait Ambulation/Gait assistance: Min guard Ambulation Distance (Feet): 80 Feet (times 2) Assistive device: Rolling walker (2 wheeled) Gait Pattern/deviations: Step-through pattern;Trunk flexed Gait velocity: Decreased Gait velocity interpretation: Below normal speed for age/gender General Gait Details: cues for postural check, otherwise pt generally steady.  pain limiting   Stairs            Wheelchair Mobility    Modified Rankin (Stroke Patients Only)       Balance Overall balance assessment: Needs assistance Sitting-balance support: Feet supported;No upper extremity supported Sitting balance-Leahy Scale: Fair     Standing balance support: No upper extremity supported Standing balance-Leahy Scale:  Fair                      Cognition Arousal/Alertness: Awake/alert Behavior During Therapy: WFL for tasks assessed/performed Overall Cognitive Status: Within Functional Limits for tasks assessed Area of Impairment: Memory;Safety/judgement;Problem solving       Following Commands: Follows one step commands consistently Safety/Judgement: Decreased awareness of safety;Decreased awareness of deficits          Exercises      General Comments General comments (skin integrity, edema, etc.): Reinforced all back precautions, lifting restrictions and progression of activity again      Pertinent Vitals/Pain     Home Living                      Prior Function            PT Goals (current goals can now be found in the care plan section) Acute Rehab PT Goals Patient Stated Goal: to go home PT Goal Formulation: With patient Time For Goal Achievement: 04/04/14 Potential to Achieve Goals: Good Progress towards PT goals: Progressing toward goals    Frequency  Min 5X/week    PT Plan Current plan remains appropriate    Co-evaluation             End of Session   Activity Tolerance: Patient tolerated treatment well Patient left: in bed;with call bell/phone within reach;with family/visitor present     Time: 4098-1191 PT Time Calculation (min): 23 min  Charges:  $Gait Training: 8-22 mins $Self Care/Home Management: 8-22  G Codes:      Mottinger, [name] 03/25/2014, 2:04 PM 03/25/2014  [place], PT [phone]  (pager)

## 2014-03-25 NOTE — Progress Notes (Signed)
Subjective: Feels ok, not having pain  Objective: Vital signs in last 24 hours: Temp:  [98.2 F (36.8 C)-98.5 F (36.9 C)] 98.5 F (36.9 C) (06/29 0422) Pulse Rate:  [82-89] 83 (06/29 0422) Resp:  [18-20] 18 (06/29 0422) BP: (124-151)/(52-70) 124/67 mmHg (06/29 0422) SpO2:  [92 %-97 %] 92 % (06/29 0422) Weight change:  Last BM Date: 03/22/14  Intake/Output from previous day: 06/28 0701 - 06/29 0700 In: 28 [I.V.:3] Out: 4 [Urine:4] Intake/Output this shift:   General appearance: alert, cooperative and no distress  Resp: clear to auscultation bilaterally  Cardio: regular rate and rhythm and with a 2/6 SEM  GI: soft, non-tender; bowel sounds normal; no masses, no organomegaly  Extremities: extremities normal, atraumatic, no cyanosis or edema  Neuro:  Oriented to person/place/time/circustance this AM.  Lab Results:  Recent Labs  03/25/14 0405  WBC 7.8  HGB 10.9*  HCT 33.3*  PLT 210   BMET  Recent Labs  03/25/14 0405  NA 140  K 4.0  CL 104  CO2 24  GLUCOSE 83  BUN 6  CREATININE 0.50  CALCIUM 9.1    Studies/Results: Dg Chest Port 1 View  03/24/2014   CLINICAL DATA:  Central line placement  EXAM: PORTABLE CHEST - 1 VIEW  COMPARISON:  03/22/2014  FINDINGS: Cardiomediastinal silhouette is stable. Persistent small right pleural effusion with right basilar atelectasis or infiltrate. Trace left basilar atelectasis. There is right arm PICC line with tip in SVC. No pneumothorax.  IMPRESSION: Right arm PICC line in place. No pneumothorax. Small right pleural effusion with right basilar atelectasis or infiltrate.   Electronically Signed   By: Natasha Mead [name]   On: 03/24/2014 14:38   Transthoracic Echocardiography  Patient: Marissa Parsons, Marissa Parsons MR #: [id number] Study Date: 03/24/2014 Gender: F Age: 80 Height: 170.2 cm Weight: 58.8 kg BSA: 1.66 m^2 Pt. Status: Room: 4N26C  Bridget Hartshorn [id number] ATTENDING Tia Alert SONOGRAPHER Melissa Morford,  RDCS PERFORMING [facility], Inpatient  cc:  ------------------------------------------------------------------- LV EF: 60% - 65%  ------------------------------------------------------------------- Indications: Dyspnea 786.09.  ------------------------------------------------------------------- History: PMH: Fever. Risk factors: Hypertension.  ------------------------------------------------------------------- Study Conclusions  - Left ventricle: The cavity size was normal. Wall thickness was normal. Systolic function was normal. The estimated ejection fraction was in the range of 60% to 65%. Wall motion was normal; there were no regional wall motion abnormalities. Features are consistent with a pseudonormal left ventricular filling pattern, with concomitant abnormal relaxation and increased filling pressure (grade 2 diastolic dysfunction). Doppler parameters are consistent with high ventricular filling pressure. - Aortic valve: Peak velocity and mean gradient likely underestimated. Morphologically there appears to be moderate to severe aortic stenosis. Mildly to moderately calcified annulus. Moderately thickened, severely calcified leaflets. There was mild regurgitation. Peak velocity (S): 299 cm/s. Mean gradient (S): 21 mm Hg. Valve area (VTI): 0.91 cm^2. Valve area (Vmax): 1.03 cm^2. - Mitral valve: Mildly calcified annulus. Normal thickness leaflets . There was mild to moderate regurgitation. - Tricuspid valve: There was mild regurgitation. - Pulmonary arteries: PA peak pressure: 38 mm Hg (S). Mildly elevated pulmonary pressures.  Transthoracic echocardiography. M-mode, complete 2D, spectral Doppler, and color Doppler. Birthdate: Patient birthdate: December 28, 1928. Age: Patient is 80 yr old. Sex: Gender: female. Height: Height: 170.2 cm. Height: 67 in. Weight: Weight: 58.8 kg. Weight: 129.4 lb. Body mass index: BMI: 20.3 kg/m^2. Body surface area: BSA: 1.66 m^2. Blood pressure:  134/58 Patient status: Inpatient. Study date: Study date: 03/24/2014. Study time: 09:12 AM. Location: Bedside.  -------------------------------------------------------------------  -------------------------------------------------------------------  Left ventricle: The cavity size was normal. Wall thickness was normal. Systolic function was normal. The estimated ejection fraction was in the range of 60% to 65%. Wall motion was normal; there were no regional wall motion abnormalities. Features are consistent with a pseudonormal left ventricular filling pattern, with concomitant abnormal relaxation and increased filling pressure (grade 2 diastolic dysfunction). Doppler parameters are consistent with high ventricular filling pressure.  ------------------------------------------------------------------- Aortic valve: Peak velocity and mean gradient likely underestimated. Morphologically there appears to be moderate to severe aortic stenosis. Mildly to moderately calcified annulus. Moderately thickened, severely calcified leaflets. Doppler: There was mild regurgitation. Valve area (VTI): 0.91 cm^2. Indexed valve area (VTI): 0.55 cm^2/m^2. Valve area (Vmax): 1.03 cm^2. Indexed valve area (Vmax): 0.62 cm^2/m^2. Mean gradient (S): 21 mm Hg. Peak gradient (S): 36 mm Hg.  ------------------------------------------------------------------- Aorta: Aortic root: The aortic root was normal in size.  ------------------------------------------------------------------- Mitral valve: Mildly calcified annulus. Normal thickness leaflets . Leaflet separation was normal. Doppler: Transvalvular velocity was within the normal range. There was no evidence for stenosis. There was mild to moderate regurgitation. Peak gradient (D): 5 mm Hg.  ------------------------------------------------------------------- Left atrium: The atrium was at the upper limits of normal in  size.  ------------------------------------------------------------------- Atrial septum: No defect or patent foramen ovale was identified.  ------------------------------------------------------------------- Right ventricle: The cavity size was normal. Wall thickness was normal. Systolic function was normal.  ------------------------------------------------------------------- Pulmonic valve: Poorly visualized. Doppler: There was no significant regurgitation.  ------------------------------------------------------------------- Tricuspid valve: Normal thickness leaflets. Doppler: There was mild regurgitation.  ------------------------------------------------------------------- Pulmonary artery: Mildly elevated pulmonary pressures.  ------------------------------------------------------------------- Right atrium: The atrium was normal in size.  ------------------------------------------------------------------- Pericardium: There was no pericardial effusion.  ------------------------------------------------------------------- Systemic veins: Inferior vena cava: The vessel was normal in size. The respirophasic diameter changes were in the normal range (>= 50%), consistent with normal central venous pressure.  ------------------------------------------------------------------- Prepared and Electronically Authenticated by  Prentice Docker, MD 2015-06-28T12:06:20  Medications:  I have reviewed the patient's current medications. Scheduled: . aspirin EC  81 mg Oral Daily  . cefTRIAXone (ROCEPHIN)  IV  1 g Intravenous Q24H  . diltiazem  240 mg Oral Daily  . docusate sodium  100 mg Oral BID  . folic acid  1 mg Oral Daily  . levothyroxine  75 mcg Oral QAC breakfast  . metoprolol tartrate  25 mg Oral BID  . potassium chloride  20 mEq Oral Daily  . predniSONE  5 mg Oral Q breakfast  . sodium chloride  10-40 mL Intracatheter Q12H  . sodium chloride  3 mL Intravenous Q12H  .  vancomycin  500 mg Intravenous Q12H   Continuous: . sodium chloride    . 0.9 % NaCl with KCl 20 mEq / L 50 mL/hr at 03/24/14 1814   ZOX:[id number], acetaminophen, albuterol, alum & mag hydroxide-simeth, HYDROcodone-acetaminophen, HYDROmorphone (DILAUDID) injection, menthol-cetylpyridinium, ondansetron (ZOFRAN) IV, oxyCODONE-acetaminophen, phenol, sodium chloride, sodium chloride  Assessment/Plan: MSSA infection of wound from recent lumbar surgery:  ID consult ordered per Neurosurgery note yesterday.  Likely will suggest d/c of Vancomycin, will need abx easy enough for home administration as intends to go home with family per social work note.  ID to decide on drug/time course.  NSurg to continue their wound care postoperatively.  Hypothyroidism: Increased her weekly dose to daily which should be easier, as her TSH slightly out of range.  Protein Calorie Malnutrition:  Low albumin on today's labs, Nutrition consult ordered.  Needs proper nutrition for wound healing.  Dyspnea: Likely multifactorial including her  known valvular heart disease and COPD.  Has Grade 2 diastolic dysfunction by Echo so fluid balance must be maintained.  Normally followed by [facility], but appears to be behind on her visits, will need f/u as outpatient.  Stable on current regimen  COPD: See above, also using incentive spirometer  Confusion:  She answers questions appropriately for orientation today.  Thyroid being adjusted, B12 level pending.  Postop day #5 per Neurosurgery, wound care  PICC line in place for home abx, as to be decided per ID as noted above.  RA: Continue current Prednisone dose.  HTN: Mildly elevated today, continue Cardizem at current dose, pain control as needed.  Continue PT/OT  She is stable from a medical standpoint currently.  ID to see today, Social work indicates dispo home with 24 hr family care.  Internal Medicine will continue to follow the patient while she is here and will  arrange f/u as outpatient with Dr. Link Snuffer at time of discharge (he is on vacation this week).       LOS: 5 days   TISOVEC,RICHARD W 03/25/2014, 7:54 AM

## 2014-03-25 NOTE — Progress Notes (Signed)
Pt informed RNCM she does not have 24/7 support at home. Pt requesting [facility]. Referral made to this facility, and insurance informed so pt has insurance auth to go to SNF at discharge. CSW following and will update pt/insurance once [facility] responds to request.   Maryclare Labrador, MSW, [facility] Clinical Social Worker [phone]

## 2014-03-25 NOTE — Progress Notes (Signed)
CSW spoke with pt/brother at length and explained SNF request. Pt/brother would like pt to go home if possible, as pt's brother had home health for his wife who needed abx a few years ago. CSW explained that if pt were to go home with home health, and home health RN would come out to do the teaching for IV abx administration, but RN would not come out every day, per [facility]. Pt's brother states he will check with insurance company about their services and ability to come to the home to do abx. CSW recommended pt/brother contact [facility], as she is already active with them and they are familiar with her insurance and what could be covered. Pt/family would like pt to go home, and will work on a potential plan in event pt needs shots and home health RN does not come out every day to assist with this.   Maryclare Labrador, MSW, [facility] Clinical Social Worker [phone]

## 2014-03-25 NOTE — Progress Notes (Signed)
Talked to patient about DCP; patient stated that she does not have 24hr care at home; she lives with her nephew and he does all of the cleaning and a next door neighbor does all of the cooking but no one cares for her personally; patient stated that she has been to [facility] nursing rehab facility and request to go back for 5 weeks for antibiotic therapy; Soc Worker Raynelle Fanning made aware of SNF placement; Alexis Goodell [phone]

## 2014-03-25 NOTE — Progress Notes (Signed)
Clinical Social Work Department BRIEF PSYCHOSOCIAL ASSESSMENT 03/25/2014  Patient:  Marissa Parsons, Marissa Parsons     Account Number:  [id number]     Admit date:  03/20/2014  Clinical Social Worker:  Varney Biles  Date/Time:  03/25/2014 03:17 PM  Referred by:  Physician  Date Referred:  03/25/2014 Referred for  SNF Placement   Other Referral:   Interview type:  Family Other interview type:   CSW also completed assessment with information from [facility]    PSYCHOSOCIAL DATA Living Status:  FAMILY Admitted from facility:   Level of care:   Primary support name:  Brunetta Genera [phone] Primary support relationship to patient:  SIBLING Degree of support available:   Good--pt has support from brother and neighbor.    CURRENT CONCERNS Current Concerns  Post-Acute Placement   Other Concerns:    SOCIAL WORK ASSESSMENT / PLAN CSW consulted for SNF placement, as the plan for discharge had been for pt to go home with 24/7 support. Pt informed RNCM today that she does not have 24/7 support; neighbor and brother provide assistance to pt, but are not with her at all times during the day. CSW informed pt's insurance case manager that plan has changed to SNF as pt does not have 24/7 support. Case manager expressed doubt that insurance would cover SNF as pt is ambulating 100 feet. CSW explained pt has wound care needs and will need IV abx for 5 weeks; placement is not for rehab, rather for complex medical needs. Insurance sending authorization request to their Wellsite geologist. In the event of a denial, MD can do peer-to-peer with Wellsite geologist at [facility]. CSW to speak with pt and brother concerning discharge planning and the possibility that insurance will deny coverage for SNF.   Assessment/plan status:  Psychosocial Support/Ongoing Assessment of Needs Other assessment/ plan:   Information/referral to community resources:   SNF? Home health?    PATIENT'S/FAMILY'S RESPONSE TO PLAN OF  CARE: Good--pt's brother understanding of CSW role in discharge and need to come up with a discharge plan.       Maryclare Labrador, MSW, [facility] Clinical Social Worker [phone]

## 2014-03-25 NOTE — Progress Notes (Signed)
Patient ID: Marissa Parsons, female   DOB: 08/25/1929, 80 y.o.   MRN: [id number] Subjective:  The patient is alert and pleasant. I spoke with her husband.  Objective: Vital signs in last 24 hours: Temp:  [97.7 F (36.5 C)-98.5 F (36.9 C)] 97.7 F (36.5 C) (06/29 1422) Pulse Rate:  [82-92] 92 (06/29 1422) Resp:  [18-20] 18 (06/29 1422) BP: (124-151)/(55-70) 133/55 mmHg (06/29 1422) SpO2:  [92 %-98 %] 98 % (06/29 1422)  Intake/Output from previous day: 06/28 0701 - 06/29 0700 In: 28 [I.V.:3] Out: 4 [Urine:4] Intake/Output this shift: Total I/O In: 860 [P.O.:360; I.V.:400; IV Piggyback:100] Out: 2 [Urine:2]  Physical exam patient is alert and oriented. She is moving her lower extremities well.  Lab Results:  Recent Labs  03/25/14 0405  WBC 7.8  HGB 10.9*  HCT 33.3*  PLT 210   BMET  Recent Labs  03/25/14 0405  NA 140  K 4.0  CL 104  CO2 24  GLUCOSE 83  BUN 6  CREATININE 0.50  CALCIUM 9.1    Studies/Results: Dg Chest Port 1 View  03/24/2014   CLINICAL DATA:  Central line placement  EXAM: PORTABLE CHEST - 1 VIEW  COMPARISON:  03/22/2014  FINDINGS: Cardiomediastinal silhouette is stable. Persistent small right pleural effusion with right basilar atelectasis or infiltrate. Trace left basilar atelectasis. There is right arm PICC line with tip in SVC. No pneumothorax.  IMPRESSION: Right arm PICC line in place. No pneumothorax. Small right pleural effusion with right basilar atelectasis or infiltrate.   Electronically Signed   By: Natasha Mead [name]   On: 03/24/2014 14:38    Assessment/Plan: Wound infection: She had a PICC line placed today. We'll ask ID to see the patient regarding length of IV antibiotic treatment and which antibiotics.  LOS: 5 days     JENKINS,JEFFREY D 03/25/2014, 4:49 PM

## 2014-03-26 ENCOUNTER — Encounter (HOSPITAL_COMMUNITY): Payer: Self-pay | Admitting: Neurological Surgery

## 2014-03-26 DIAGNOSIS — T8140XA Infection following a procedure, unspecified, initial encounter: Secondary | ICD-10-CM

## 2014-03-26 DIAGNOSIS — Y849 Medical procedure, unspecified as the cause of abnormal reaction of the patient, or of later complication, without mention of misadventure at the time of the procedure: Secondary | ICD-10-CM

## 2014-03-26 DIAGNOSIS — Z9889 Other specified postprocedural states: Secondary | ICD-10-CM

## 2014-03-26 LAB — ANAEROBIC CULTURE

## 2014-03-26 LAB — C-REACTIVE PROTEIN: CRP: 4.7 mg/dL — ABNORMAL HIGH (ref <0.60)

## 2014-03-26 LAB — SEDIMENTATION RATE: Sed Rate: 61 mm/hr — ABNORMAL HIGH (ref 0–22)

## 2014-03-26 MED ORDER — CEFAZOLIN SODIUM-DEXTROSE 2-3 GM-% IV SOLR
2.0000 g | Freq: Three times a day (TID) | INTRAVENOUS | Status: DC
  Administered 2014-03-26: 2 g via INTRAVENOUS
  Filled 2014-03-26 (×3): qty 50

## 2014-03-26 MED ORDER — DEXTROSE 5 % IV SOLN: 2.0000 g | INTRAVENOUS | Status: DC

## 2014-03-26 MED ORDER — DEXTROSE 5 % IV SOLN
2.0000 g | INTRAVENOUS | Status: DC
  Administered 2014-03-26 – 2014-03-27 (×2): 2 g via INTRAVENOUS
  Filled 2014-03-26 (×3): qty 2

## 2014-03-26 NOTE — Progress Notes (Signed)
Patient ID: Marissa Parsons, female   DOB: 11-Sep-1929, 80 y.o.   MRN: [id number] Subjective:  The patient is alert and pleasant. She is in no apparent distress.  Objective: Vital signs in last 24 hours: Temp:  [97.7 F (36.5 C)-99.1 F (37.3 C)] 98.7 F (37.1 C) (06/30 0525) Pulse Rate:  [80-92] 84 (06/30 0525) Resp:  [16-18] 18 (06/30 0525) BP: (115-138)/(42-81) 135/75 mmHg (06/30 0525) SpO2:  [92 %-98 %] 92 % (06/30 0525)  Intake/Output from previous day: 06/29 0701 - 06/30 0700 In: 860 [P.O.:360; I.V.:400; IV Piggyback:100] Out: 4 [Urine:4] Intake/Output this shift:    Physical exam the patient is alert and oriented. She is moving her lower extremities well. Her dressing has some purulent discharge on that.  Lab Results:  Recent Labs  03/25/14 0405  WBC 7.8  HGB 10.9*  HCT 33.3*  PLT 210   BMET  Recent Labs  03/25/14 0405  NA 140  K 4.0  CL 104  CO2 24  GLUCOSE 83  BUN 6  CREATININE 0.50  CALCIUM 9.1    Studies/Results: Dg Chest Port 1 View  03/24/2014   CLINICAL DATA:  Central line placement  EXAM: PORTABLE CHEST - 1 VIEW  COMPARISON:  03/22/2014  FINDINGS: Cardiomediastinal silhouette is stable. Persistent small right pleural effusion with right basilar atelectasis or infiltrate. Trace left basilar atelectasis. There is right arm PICC line with tip in SVC. No pneumothorax.  IMPRESSION: Right arm PICC line in place. No pneumothorax. Small right pleural effusion with right basilar atelectasis or infiltrate.   Electronically Signed   By: Natasha Mead [name]   On: 03/24/2014 14:38    Assessment/Plan: Postop day #4: We will make arrangements for home IV antibiotics. I have asked infectious disease to guide [place] on which antibiotics for what length.  LOS: 6 days     Lexxi Koslow D 03/26/2014, 7:55 AM

## 2014-03-26 NOTE — Progress Notes (Signed)
Discussed disposition with Dr. Lovell Sheehan, pt will be discharged in the am with home health. Pt and family agreeable.

## 2014-03-26 NOTE — Progress Notes (Signed)
Pt's brother called CSW asking for information about [facility] so he can speak with them about their services. CSW asked RNCM to have the case worker with [facility] call pt's brother. CSW following and will assist in any way possible; unclear if plan could be SNF or if pt will go home with home health.   Maryclare Labrador, MSW, [facility] Clinical Social Worker [phone]

## 2014-03-26 NOTE — Progress Notes (Signed)
Family is committed to having her come home and can do once a day ceftriaxone 2gm IV daily.

## 2014-03-26 NOTE — Progress Notes (Signed)
Subjective: Per patient report, does not meet medical need for SNF as this was being looked back in to yesterday.  Has not seen ID, Lovell Sheehan called again yesterday.  WIll be on PCN based tx, need time course and f/u.  Feels fine this AM, no confusion noted.  Objective: Vital signs in last 24 hours: Temp:  [97.7 F (36.5 C)-99.1 F (37.3 C)] 98.7 F (37.1 C) (06/30 0525) Pulse Rate:  [80-92] 84 (06/30 0525) Resp:  [16-18] 18 (06/30 0525) BP: (115-138)/(42-81) 135/75 mmHg (06/30 0525) SpO2:  [92 %-98 %] 92 % (06/30 0525) Weight change:  Last BM Date: 03/20/14  Intake/Output from previous day: 06/29 0701 - 06/30 0700 In: 860 [P.O.:360; I.V.:400; IV Piggyback:100] Out: 4 [Urine:4] Intake/Output this shift:   General appearance: alert, cooperative and no distress  Resp: clear to auscultation bilaterally  Cardio: regular rate and rhythm and with a 2/6 SEM  GI: soft, non-tender; bowel sounds normal; no masses, no organomegaly  Extremities: extremities normal, atraumatic, no cyanosis or edema  Neuro: Oriented to person/place/time/circustance this AM.   Lab Results:  Recent Labs  03/25/14 0405  WBC 7.8  HGB 10.9*  HCT 33.3*  PLT 210   BMET  Recent Labs  03/25/14 0405  NA 140  K 4.0  CL 104  CO2 24  GLUCOSE 83  BUN 6  CREATININE 0.50  CALCIUM 9.1    Studies/Results: Dg Chest Port 1 View  03/24/2014   CLINICAL DATA:  Central line placement  EXAM: PORTABLE CHEST - 1 VIEW  COMPARISON:  03/22/2014  FINDINGS: Cardiomediastinal silhouette is stable. Persistent small right pleural effusion with right basilar atelectasis or infiltrate. Trace left basilar atelectasis. There is right arm PICC line with tip in SVC. No pneumothorax.  IMPRESSION: Right arm PICC line in place. No pneumothorax. Small right pleural effusion with right basilar atelectasis or infiltrate.   Electronically Signed   By: Natasha Mead [name]   On: 03/24/2014 14:38    Medications:  I have reviewed the patient's  current medications. Scheduled: . aspirin EC  81 mg Oral Daily  . cefTRIAXone (ROCEPHIN)  IV  1 g Intravenous Q24H  . diltiazem  240 mg Oral Daily  . docusate sodium  100 mg Oral BID  . folic acid  1 mg Oral Daily  . lactose free nutrition  237 mL Oral TID PC  . levothyroxine  75 mcg Oral QAC breakfast  . metoprolol tartrate  25 mg Oral BID  . multivitamin with minerals  1 tablet Oral Daily  . polyethylene glycol  17 g Oral Daily  . potassium chloride  20 mEq Oral Daily  . predniSONE  5 mg Oral Q breakfast  . sodium chloride  10-40 mL Intracatheter Q12H  . sodium chloride  3 mL Intravenous Q12H  . vancomycin  500 mg Intravenous Q12H   Continuous: . sodium chloride    . 0.9 % NaCl with KCl 20 mEq / L 50 mL/hr at 03/25/14 1748   WOE:[id number], acetaminophen, albuterol, alum & mag hydroxide-simeth, HYDROcodone-acetaminophen, HYDROmorphone (DILAUDID) injection, menthol-cetylpyridinium, ondansetron (ZOFRAN) IV, oxyCODONE-acetaminophen, phenol, sodium chloride, sodium chloride  Assessment/Plan: MSSA infection of wound from recent lumbar surgery: ID consult ordered per Neurosurgery note yesterday. Likely will suggest d/c of Vancomycin, will need abx easy enough for home administration as intends to go home with family per social work note. ID to decide on drug/time course, recontacted per [facility]. NSurg to continue their wound care postoperatively.  Hypothyroidism: Increased her weekly dose to  daily yesterday which should be easier, as her TSH slightly out of range.  Protein Calorie Malnutrition: Low albumin on today's labs, Nutrition consult appreciated. Boost Plus TID, Needs proper nutrition for wound healing. B12 level normal Dyspnea: Likely multifactorial including her known valvular heart disease and COPD. Has Grade 2 diastolic dysfunction by Echo so fluid balance must be maintained. Normally followed by [facility], but appears to be behind on her visits, will need f/u as  outpatient. Stable on current regimen.  Was able to walk down hallways yesterday without any SOB. COPD: See above, also using incentive spirometer  Confusion: She answers questions appropriately for orientation today. Thyroid being adjusted, B12 level pending.  Postop day #6 per Neurosurgery, wound care  PICC line in place for home abx, as to be decided per ID as noted above.  If regimen can be solidified, possibly home later today. RA: Continue current Prednisone dose.  HTN: Mildly elevated today, continue Cardizem at current dose, pain control as needed.  Continue PT/OT if needed at home.  Will continue to follow medically as consultant while in house and arrange for f/u with Dr. Link Snuffer 1-2 weeks post discharge, which will be at the discretion on Neurosurgery since she is on their service.     LOS: 6 days   TISOVEC,RICHARD W 03/26/2014, 8:03 AM

## 2014-03-26 NOTE — Consult Note (Addendum)
[facility]  Total days of antibiotics 7        Day 7 vanco        Day 6 ceftriaxone               Reason for Consult: wound infection after spine surgery    Referring Physician: jenkins  Principal Problem:   Wound infection after surgery Active Problems:   UTI (lower urinary tract infection)   Fever   Leukocytosis   Hypoxia   Status post lumbar laminectomy    HPI: Marissa Parsons is a 80 y.o. female with hx of RA, HTN, significant vision impairment from macular degeneration, spinal stenosis status post recent L3-L4 decompressive lumbar laminectomy (03/03/14) who presented to the emergency department with the complaint of nausea/vomiting and fever 103F on 6/24. In addition to N/V on admit, it appeared that she had draining purulence from her wound.  she underwent MRI imaging that showed A loculated fluid collection measuring approximately 6.3 x 5.6 x 2.4 cm seen along the midline incision (series 10, image 10) with Mild postcontrast enhancement, unclear if post op seroma vs. Infection. This collection abuts the dorsal aspect of the thecal sac at the L3 and L4 levels along the laminectomy site. No epidural abscess. On 6/25 she underwent urgent irrigation and debridement of lumbar wound. OR report stated that once her old incision was opened there was immediate release of purulence and cultures were sent for Gram stain and anaerobic and aerobic culture. the fascia and the musculature were opened. It did not appear that the infection with subfascial. OR cx grew MSSA on 6/25. Blood cx NGTD, urine cx had polymicrobial from improper specimen (too many squamous cells) She was initially started on ceftriaxone and vancomycin. ID consulted for recommendations on antibiotic therapy. She has responded to broad spectrum antibiotics with wbc now at 7.8 down from 15K. She lives with her nephew but often relies on a neighbor to help with doing errands, going out since she is visually  impaired.   Past Medical History  Diagnosis Date  . Hypertension   . Rheumatoid arteritis   . Phlebitis   . Deep vein thrombophlebitis of leg   . Fractured pelvis     fall 2011  . Thyroid disease     hypothyroidism  . Humerus fracture   . Thrombocytopenia   . Leukocytosis   . Hypothyroidism   . Phlebitis      Recurrent phlebitis  . Hematoma      Right superior and inferior pubic rami fractures with hematoma adjacent to the right ramus fracture  . Urinary tract infection   . Humerus fracture      Right proximal humerus fracture  . Osteoarthritis     for which she takes chronic prednisone  . Macular degeneration   . Chronic anticoagulation   . Fracture of multiple pubic rami 01/16/2013  . Dementia     MILD    Allergies:  Allergies  Allergen Reactions  . Morphine And Related     nausea  . Tramadol Other (See Comments)    unknown  . Penicillins Rash      MEDICATIONS: . aspirin EC  81 mg Oral Daily  . cefTRIAXone (ROCEPHIN)  IV  1 g Intravenous Q24H  . diltiazem  240 mg Oral Daily  . docusate sodium  100 mg Oral BID  . folic acid  1 mg Oral Daily  . lactose free nutrition  237 mL Oral TID  PC  . levothyroxine  75 mcg Oral QAC breakfast  . metoprolol tartrate  25 mg Oral BID  . multivitamin with minerals  1 tablet Oral Daily  . polyethylene glycol  17 g Oral Daily  . potassium chloride  20 mEq Oral Daily  . predniSONE  5 mg Oral Q breakfast  . sodium chloride  10-40 mL Intracatheter Q12H  . sodium chloride  3 mL Intravenous Q12H  . vancomycin  500 mg Intravenous Q12H    History  Substance Use Topics  . Smoking status: Former Research scientist (life sciences)  . Smokeless tobacco: Never Used  . Alcohol Use: No   Family hx: reviewed and non contributory  Review of Systems  Constitutional: Negative for fever, chills, diaphoresis, activity change, appetite change, fatigue and unexpected weight change.  HENT: Negative for congestion, sore throat, rhinorrhea, sneezing, trouble  swallowing and sinus pressure.  Eyes: Negative for photophobia and visual disturbance.  Respiratory: Negative for cough, chest tightness, shortness of breath, wheezing and stridor.  Cardiovascular: Negative for chest pain, palpitations and leg swelling.  Gastrointestinal: Negative for nausea, vomiting, abdominal pain, diarrhea, constipation, blood in stool, abdominal distention and anal bleeding.  Genitourinary: Negative for dysuria, hematuria, flank pain and difficulty urinating.  Musculoskeletal: Negative for myalgias, back pain, joint swelling, arthralgias and gait problem.  Skin: Negative for color change, pallor, rash and wound.  Neurological: Negative for dizziness, tremors, weakness and light-headedness.  Hematological: Negative for adenopathy. Does not bruise/bleed easily.  Psychiatric/Behavioral: Negative for behavioral problems, confusion, sleep disturbance, dysphoric mood, decreased concentration and agitation.     OBJECTIVE: Temp:  [97.7 F (36.5 C)-99.1 F (37.3 C)] 98.7 F (37.1 C) (06/30 0525) Pulse Rate:  [80-92] 84 (06/30 0525) Resp:  [16-18] 18 (06/30 0525) BP: (115-138)/(42-81) 135/75 mmHg (06/30 0525) SpO2:  [92 %-98 %] 92 % (06/30 0525)  Constitutional:  oriented to person, place, and time. appears well-developed and well-nourished.appears her stated age. No distress.  HENT: doesn't track as you move around the room, likely due to decreased vision Mouth/Throat: Oropharynx is clear and moist. No oropharyngeal exudate.  Cardiovascular: Normal rate, regular rhythm and normal heart sounds. Exam reveals no gallop and no friction rub.  No murmur heard.  Pulmonary/Chest: Effort normal and breath sounds normal. No respiratory distress.  has no wheezes.  Abdominal: Soft. Bowel sounds are normal.  exhibits no distension. There is no tenderness.  Lymphadenopathy: no cervical adenopathy.  Neurological: alert and oriented to person, place, and time. Moves all  extremities Skin: back incision is closed with staples no erythema but has appearance of slight maceration from serous drainage noted on bandage Psychiatric: a normal mood and affect.  behavior is normal.   LABS: Results for orders placed during the hospital encounter of 03/20/14 (from the past 48 hour(s))  CBC WITH DIFFERENTIAL     Status: Abnormal   Collection Time    03/25/14  4:05 AM      Result Value Ref Range   WBC 7.8  4.0 - 10.5 K/uL   RBC 3.12 (*) 3.87 - 5.11 MIL/uL   Hemoglobin 10.9 (*) 12.0 - 15.0 g/dL   HCT 33.3 (*) 36.0 - 46.0 %   MCV 106.7 (*) 78.0 - 100.0 fL   MCH 34.9 (*) 26.0 - 34.0 pg   MCHC 32.7  30.0 - 36.0 g/dL   RDW 14.4  11.5 - 15.5 %   Platelets 210  150 - 400 K/uL   Neutrophils Relative % 63  43 - 77 %  Neutro Abs 4.9  1.7 - 7.7 K/uL   Lymphocytes Relative 24  12 - 46 %   Lymphs Abs 1.9  0.7 - 4.0 K/uL   Monocytes Relative 11  3 - 12 %   Monocytes Absolute 0.9  0.1 - 1.0 K/uL   Eosinophils Relative 2  0 - 5 %   Eosinophils Absolute 0.2  0.0 - 0.7 K/uL   Basophils Relative 0  0 - 1 %   Basophils Absolute 0.0  0.0 - 0.1 K/uL  COMPREHENSIVE METABOLIC PANEL     Status: Abnormal   Collection Time    03/25/14  4:05 AM      Result Value Ref Range   Sodium 140  137 - 147 mEq/L   Potassium 4.0  3.7 - 5.3 mEq/L   Chloride 104  96 - 112 mEq/L   CO2 24  19 - 32 mEq/L   Glucose, Bld 83  70 - 99 mg/dL   BUN 6  6 - 23 mg/dL   Creatinine, Ser 0.50  0.50 - 1.10 mg/dL   Calcium 9.1  8.4 - 10.5 mg/dL   Total Protein 5.7 (*) 6.0 - 8.3 g/dL   Albumin 2.4 (*) 3.5 - 5.2 g/dL   AST 71 (*) 0 - 37 U/L   ALT 32  0 - 35 U/L   Alkaline Phosphatase 52  39 - 117 U/L   Total Bilirubin 0.4  0.3 - 1.2 mg/dL   GFR calc non Af Amer 87 (*) >90 mL/min   GFR calc Af Amer >90  >90 mL/min   Comment: (NOTE)     The eGFR has been calculated using the CKD EPI equation.     This calculation has not been validated in all clinical situations.     eGFR's persistently <90 mL/min signify  possible Chronic Kidney     Disease.  VITAMIN B12     Status: None   Collection Time    03/25/14  4:05 AM      Result Value Ref Range   Vitamin B-12 432  211 - 911 pg/mL   Comment: Performed at [facility]  TSH     Status: Abnormal   Collection Time    03/25/14  4:05 AM      Result Value Ref Range   TSH 4.850 (*) 0.350 - 4.500 uIU/mL    MICRO: 6/25 or ON:GEXB 6/24 blood cx ngtd 6/24 urine cx polymicrobial IMAGING: Dg Chest Port 1 View  03/24/2014   CLINICAL DATA:  Central line placement  EXAM: PORTABLE CHEST - 1 VIEW  COMPARISON:  03/22/2014  FINDINGS: Cardiomediastinal silhouette is stable. Persistent small right pleural effusion with right basilar atelectasis or infiltrate. Trace left basilar atelectasis. There is right arm PICC line with tip in SVC. No pneumothorax.  IMPRESSION: Right arm PICC line in place. No pneumothorax. Small right pleural effusion with right basilar atelectasis or infiltrate.   Electronically Signed   By: Lahoma Crocker [name]   On: 03/24/2014 14:38  MRI of spine  For the purposes of this dictation, the lowest well-formed  intervertebral disc spaces presumed to be the L5-S1 level, and there  presumed to be 5 lumbar type vertebral bodies.  Vertebral bodies are normally aligned with preservation of the  normal lumbar lordosis. Vertebral body heights are preserved. No  acute fracture or or listhesis. Signal intensity within the  vertebral body bone marrow is within normal limits. No evidence of  osteomyelitis discitis.  Signal intensity within the  visualized spinal cord is within normal  limits. Conus medullaris terminates at the L1 level.  Postsurgical changes from interval decompressive laminectomy and  facetectomy at L3-4, L4-5, and L5-S1 is seen. Heterogeneous signal  intensity seen within the paraspinous soft tissues at the  laminectomy site, compatible with postoperative changes. A loculated  fluid collection measuring approximately 6.3 x 5.6 x 2.4  cm seen  along the midline incision (series 10, image 10). Mild postcontrast  enhancement seen about this collection. While this finding may  represent a postoperative seroma, possible infection is not  excluded. This collection abuts the dorsal aspect of the thecal sac  at the L3 and L4 levels along the laminectomy site. No epidural  abscess.  L1-2: Shallow central disc protrusion again noted without  significant stenosis.  L2-3: Mild facet hypertrophy again noted bilaterally. There is mild  diffuse degenerative disc bulge. No significant stenosis.  L3-4: Postoperative changes from prior decompressive laminectomy and  medial facetectomy present. Broad-based disc bulging. No previously  seen bilateral lateral recess stenosis is slightly improved. Mild  bilateral foraminal narrowing is stable.  L4-5: Sequelae of postoperative decompressive laminectomy and medial  facetectomy. Previously seen severe central canal stenosis is  markedly improved. Moderate bilateral foraminal narrowing also  improved, 0. There is persistent mild bilateral foraminal stenosis  at this level.  L5-S1: Postoperative changes from decompressive laminectomy present.  Broad-based degenerative disc bulging again seen. Previously seen  moderate central canal stenosis is improved as is bilateral lateral  recess narrowing. Mild bilateral foraminal stenosis is stable.  Visualized visceral structures are unremarkable.  IMPRESSION:  1. Postoperative changes from interval decompressive laminectomy and  medial facetectomy at L3-4, L4-5, and L5-S1.  2. Loculated fluid collection measuring approximately 6.3 x 5.6 x  2.4 cm along the midline incision with mild peripheral post-contrast  enhancement. While this finding may reflect postoperative seroma,  superimposed infection is not excluded. Clinical correlation  recommended. No epidural abscess or evidence of osteomyelitis  discitis seen elsewhere within the lumbar spine.   3. Interval improvement in central canal stenosis at L3 through S1  status post spinal decompression.  4. Additional degenerative changes as above.     Assessment/Plan:  80yo F with lumbar laminectomy c/b MSSA deep tissue post surgical site infection   - she should be treated with at least 4 wks of IV antibiotics possibly 6 wks if inflammatory markers still elevated at 4 wk or poor would healing. - Ideal regimen should be cefazolin 2gm IV Q8hr, but it is unclear if she has sufficient help at home to provide medications three times per day. Alternatively, could do ceftriaxone 2gm IV daily. Family is debating if she could do snf. - would use 6/26 as day 1 of 42. End date August 6th, 2015 (if using 6 wks as end of treatment) - she will need weekly bmp,cbc while on antibiotics, and picc line dressing change by home health - we will see her back in the ID clinic in 2 wk and at end of therapy at 5 wks so that we can determine is she needs to be switched to oral antibiotics - will check sed rate and crp today - I have asked her RN to change current bandage since it doesn't appear to adhere to her  Skin on lumbar region   [facility] [phone]

## 2014-03-26 NOTE — Progress Notes (Signed)
Physical Therapy Treatment Patient Details Name: Marissa Parsons MRN: [id number] DOB: 01-31-1929 Today's Date: 03/26/2014    History of Present Illness Pt readmitted with infection at site of recent lumbar laminectomy.  ABX therapy in pprocess.    PT Comments    Progressing well.  Can mobilize well within advised parameters with cues, but forgets technique over time.  Have emphasized practice of bed mobility and general safety issues.  Follow Up Recommendations  Home health PT (family/pt wants d/c to home.  Refusing snf.)     Equipment Recommendations  None recommended by PT    Recommendations for Other Services       Precautions / Restrictions Precautions Precautions: Back;Fall Precaution Comments: Reviewed precautions with pt Restrictions Weight Bearing Restrictions: No    Mobility  Bed Mobility Overal bed mobility: Needs Assistance Bed Mobility: Rolling;Sidelying to Sit;Sit to Sidelying Rolling: Supervision Sidelying to sit: Supervision     Sit to sidelying: Supervision General bed mobility comments: reinforced and demo'd log roll and best technique side to/from sit.  Pt practiced 4 x's and now is supervision.  Transfers Overall transfer level: Needs assistance   Transfers: Sit to/from Stand Sit to Stand: Supervision         General transfer comment: cues for safety/ hand placement  Ambulation/Gait Ambulation/Gait assistance: Supervision Ambulation Distance (Feet): 200 Feet (then additional 100 feet.) Assistive device: Rolling walker (2 wheeled) Gait Pattern/deviations: Step-through pattern;Trunk flexed Gait velocity: Decreased   General Gait Details: postural checks and cues to stay appropriately close to the RW   Stairs Stairs: Yes Stairs assistance: Min assist   Number of Stairs: 3    Wheelchair Mobility    Modified Rankin (Stroke Patients Only)       Balance Overall balance assessment: Needs assistance Sitting-balance support: No  upper extremity supported Sitting balance-Leahy Scale: Good     Standing balance support: No upper extremity supported Standing balance-Leahy Scale: Fair                      Cognition Arousal/Alertness: Awake/alert Behavior During Therapy: WFL for tasks assessed/performed Overall Cognitive Status: Within Functional Limits for tasks assessed         Following Commands: Follows one step commands consistently Safety/Judgement: Decreased awareness of safety;Decreased awareness of deficits          Exercises      General Comments General comments (skin integrity, edema, etc.): Reinforced all back education and safety issues.      Pertinent Vitals/Pain     Home Living                      Prior Function            PT Goals (current goals can now be found in the care plan section) Acute Rehab PT Goals Patient Stated Goal: to go home PT Goal Formulation: With patient Time For Goal Achievement: 04/04/14 Potential to Achieve Goals: Good Progress towards PT goals: Progressing toward goals    Frequency  Min 5X/week    PT Plan Current plan remains appropriate    Co-evaluation             End of Session   Activity Tolerance: Patient tolerated treatment well Patient left: in bed;with call bell/phone within reach;with family/visitor present     Time: 2952-8413 PT Time Calculation (min): 25 min  Charges:  $Gait Training: 8-22 mins  G Codes:      Mottinger, [name] 03/26/2014, 2:12 PM 03/26/2014  Dandridge Bing, PT [phone]  (pager)

## 2014-03-26 NOTE — Progress Notes (Signed)
Occupational Therapy Treatment Patient Details Name: LACHAE RHYNES MRN: [id number] DOB: Feb 05, 1929 Today's Date: 03/26/2014    History of present illness Pt readmitted with infection at site of recent lumbar laminectomy.  ABX therapy in pprocess.   OT comments  Pt. Motivated and eager for participation in skilled OT.  Able to safely demonstrate shower stall transfer.  Doing well with bed mobility.  Still requires intermittent cues for back precautions and walker safety.    Follow Up Recommendations  Home health OT;Supervision/Assistance - 24 hour    Equipment Recommendations  3 in 1 bedside comode          Precautions / Restrictions Precautions Precautions: Back;Fall Precaution Comments: Reviewed precautions with pt Restrictions Weight Bearing Restrictions: No       Mobility Bed Mobility Overal bed mobility: Needs Assistance Bed Mobility: Rolling;Sidelying to Sit Rolling: Supervision Sidelying to sit: Supervision     Sit to sidelying: Supervision General bed mobility comments: able to complete oob hob flat and no rails s.  required addtional inst. and review for safe transfer into bed (refer to PT doc.)  Transfers Overall transfer level: Needs assistance Equipment used: Rolling walker (2 wheeled) Transfers: Sit to/from Stand Sit to Stand: Supervision         General transfer comment: cues for safety/ hand placement    Balance Overall balance assessment: Needs assistance Sitting-balance support: No upper extremity supported Sitting balance-Leahy Scale: Good     Standing balance support: No upper extremity supported Standing balance-Leahy Scale: Fair                     ADL Overall ADL's : Needs assistance/impaired                         Toilet Transfer: Min guard;Ambulation Toilet Transfer Details (indicate cue type and reason): sim. with functional transfers during tx session Toileting- Clothing Manipulation and Hygiene: Min  guard;Sit to/from stand Toileting - Clothing Manipulation Details (indicate cue type and reason): sim. during tx. session Tub/ Shower Transfer: Min guard;Walk-in shower;Shower seat;Ambulation;Adhering to back precautions;Grab bars   Functional mobility during ADLs: Min guard;Rolling walker General ADL Comments: cues for back precautions, pt. able to recall 1/3.  cues for walker safety and posture during ambulation.  has shower seat and grab bars.  able to complete shower stall transfer min guard a                                      Cognition   Behavior During Therapy: WFL for tasks assessed/performed Overall Cognitive Status: Within Functional Limits for tasks assessed          Following Commands: Follows one step commands consistently Safety/Judgement: Decreased awareness of safety;Decreased awareness of deficits                                           Pertinent Vitals/ Pain      No c/o pain per pt.  Frequency Min 2X/week     Progress Toward Goals  OT Goals(current goals can now be found in the care plan section)  Progress towards OT goals: Progressing toward goals  Acute Rehab OT Goals Patient Stated Goal: to go home  Plan Discharge plan remains appropriate                    End of Session Equipment Utilized During Treatment: Rolling walker   Activity Tolerance Patient tolerated treatment well   Patient Left in bed;with call bell/phone within reach;Other (comment) (PT finishing session with pt.)             Time: 4098-1191 OT Time Calculation (min): 21 min  Charges: OT General Charges $OT Visit: 1 Procedure OT Treatments $Self Care/Home Management : 8-22 mins  Robet Leu, COTA/L 03/26/2014, 2:14 PM

## 2014-03-27 LAB — CULTURE, BLOOD (ROUTINE X 2)
Culture: NO GROWTH
Culture: NO GROWTH

## 2014-03-27 MED ORDER — HEPARIN SOD (PORK) LOCK FLUSH 100 UNIT/ML IV SOLN
250.0000 [IU] | INTRAVENOUS | Status: AC | PRN
  Administered 2014-03-27: 250 [IU]

## 2014-03-27 MED ORDER — HYDROCODONE-ACETAMINOPHEN 5-325 MG PO TABS: 1.0000 | ORAL_TABLET | ORAL | Status: DC | PRN

## 2014-03-27 NOTE — Progress Notes (Signed)
Patient evaluated for community based chronic disease management services with [facility] Management Program as a benefit of patient's [employer]. Spoke with patient at bedside to explain [facility] Care Management services.  Services have been accepted with written consent.  Patient is partially blind therefore she authorized her brother to sign consent.  Brunetta Genera (Brother) is authorized contact home [phone] or cell [phone].  Patient will receive a post discharge transition of care call and will be evaluated for monthly home visits for assessments and disease process education.  Left contact information and [facility] literature at bedside. Made Inpatient Case Manager aware that [facility] Care Management following. Of note, [facility] Care Management services does not replace or interfere with any services that are arranged by inpatient case management or social work.  For additional questions or referrals please contact Anibal Henderson BSN RN [facility] Liaison at [phone].

## 2014-03-27 NOTE — Discharge Summary (Signed)
Physician Discharge Summary  Patient ID: Marissa Parsons MRN: [id number] DOB/AGE: [birth year] 80 y.o.  Admit date: 03/20/2014 Discharge date: 03/27/2014  Admission Diagnoses: Wound infection  Discharge Diagnoses: The same Principal Problem:   Wound infection after surgery Active Problems:   UTI (lower urinary tract infection)   Fever   Leukocytosis   Hypoxia   Status post lumbar laminectomy   Discharged Condition: good  Hospital Course: The patient was readmitted on 03/20/2014 with a diagnosis of a wound infection. She underwent an incision and drainage of the wound. Cultures grew out methicillin sensitive staph aureus. A PICC line was placed The patient was seen by infectious disease who recommended IV Rocephin every 24 hours. Arrangements were made for home health dressing changes and IV antibiotics. The patient requested discharge to home and was discharged home on 03/27/2014. She was given oral and written discharge instructions. All her questions were answered.  Consults: Infectious disease Significant Diagnostic Studies: None Treatments: Incision and drainage of wound, PICC line Discharge Exam: Blood pressure 127/42, pulse 81, temperature 98.2 F (36.8 C), temperature source Oral, resp. rate 16, height 5\' 7"  (1.702 m), weight 58.8 kg (129 lb 10.1 oz), SpO2 93.00%. The patient is alert and pleasant. She is oriented x3 her strength is normal in her lower extremities.  Disposition: Home  Discharge Instructions   Call MD for:  difficulty breathing, headache or visual disturbances    Complete by:  As directed      Call MD for:  extreme fatigue    Complete by:  As directed      Call MD for:  hives    Complete by:  As directed      Call MD for:  persistant dizziness or light-headedness    Complete by:  As directed      Call MD for:  persistant nausea and vomiting    Complete by:  As directed      Call MD for:  redness, tenderness, or signs of infection (pain, swelling,  redness, odor or green/yellow discharge around incision site)    Complete by:  As directed      Call MD for:  severe uncontrolled pain    Complete by:  As directed      Call MD for:  temperature >100.4    Complete by:  As directed      Change dressing (specify)    Complete by:  As directed   Dressing change: 1 times per day using sterile gauze.     Diet - low sodium heart healthy    Complete by:  As directed      Discharge instructions    Complete by:  As directed   Call [phone] for a followup appointment. Take a stool softener while you are using pain medications.     Driving Restrictions    Complete by:  As directed   Do not drive for 2 weeks.     Increase activity slowly    Complete by:  As directed      Lifting restrictions    Complete by:  As directed   Do not lift more than 5 pounds. No excessive bending or twisting.     May shower / Bathe    Complete by:  As directed   He may shower after the pain she is removed 3 days after surgery. Leave the incision alone.            Medication List    STOP taking these medications  acetaminophen 500 MG chewable tablet  Commonly known as:  TYLENOL     diazepam 5 MG tablet  Commonly known as:  VALIUM     oxyCODONE-acetaminophen 5-325 MG per tablet  Commonly known as:  PERCOCET/ROXICET      TAKE these medications       alendronate 70 MG tablet  Commonly known as:  FOSAMAX  Take 70 mg by mouth once a week. Take on Monday.Take with a full glass of water on an empty stomach.     aspirin EC 81 MG tablet  Take 81 mg by mouth daily.     bisoprolol-hydrochlorothiazide 5-6.25 MG per tablet  Commonly known as:  ZIAC  Take 1 tablet by mouth daily.     cefTRIAXone 2 g in dextrose 5 % 50 mL  Inject 2 g into the vein daily.     diltiazem 240 MG 24 hr capsule  Commonly known as:  CARDIZEM CD  Take 240 mg by mouth daily.     folic acid 1 MG tablet  Commonly known as:  FOLVITE  Take 1 mg by mouth daily.      HYDROcodone-acetaminophen 5-325 MG per tablet  Commonly known as:  NORCO/VICODIN  Take 1-2 tablets by mouth every 4 (four) hours as needed for moderate pain.     levothyroxine 50 MCG tablet  Commonly known as:  SYNTHROID, LEVOTHROID  Take 50-100 mcg by mouth See admin instructions. Take 2 tablets (100 mcg) on Monday and Friday, take 1 tablet (50 mcg) on Sunday, Tuesday, Wednesday, Thursday, Saturday     methotrexate 2.5 MG tablet  Commonly known as:  RHEUMATREX  Take 10 mg by mouth 2 (two) times a week. On Tuesdays and Wednesdays      ASK your doctor about these medications       predniSONE 5 MG tablet  Commonly known as:  DELTASONE  Take 5 mg by mouth daily.           Follow-up Information   Follow up with Alysia Penna, MD. Schedule an appointment as soon as possible for a visit in 1 week.   Specialty:  Internal Medicine   Contact information:   [address] [address] [phone]       Signed: Cristi Loron 03/27/2014, 9:49 AM

## 2014-03-27 NOTE — Progress Notes (Signed)
Subjective: Resting comfortably, no issues overnight  Objective: Vital signs in last 24 hours: Temp:  [97.5 F (36.4 C)-98.3 F (36.8 C)] 98.2 F (36.8 C) (07/01 0531) Pulse Rate:  [81-94] 81 (07/01 0531) Resp:  [16-18] 16 (07/01 0531) BP: (113-146)/(42-77) 127/42 mmHg (07/01 0531) SpO2:  [93 %-100 %] 93 % (07/01 0531) Weight change:  Last BM Date: 03/26/14  Intake/Output from previous day: 06/30 0701 - 07/01 0700 In: 790 [P.O.:780; I.V.:10] Out: 2 [Urine:2] Intake/Output this shift: Total I/O In: -  Out: 2 [Urine:2]  General appearance: alert, cooperative and no distress  Resp: clear to auscultation bilaterally  Cardio: regular rate and rhythm and with a 2/6 SEM  GI: soft, non-tender; bowel sounds normal; no masses, no organomegaly  Extremities: extremities normal, atraumatic, no cyanosis or edema  Neuro: Oriented x 3, no new deficits   Lab Results:  Recent Labs  03/25/14 0405  WBC 7.8  HGB 10.9*  HCT 33.3*  PLT 210   BMET  Recent Labs  03/25/14 0405  NA 140  K 4.0  CL 104  CO2 24  GLUCOSE 83  BUN 6  CREATININE 0.50  CALCIUM 9.1    Studies/Results: No results found.  Medications:  I have reviewed the patient's current medications. Scheduled: . aspirin EC  81 mg Oral Daily  . cefTRIAXone (ROCEPHIN)  IV  2 g Intravenous Q24H  . diltiazem  240 mg Oral Daily  . docusate sodium  100 mg Oral BID  . folic acid  1 mg Oral Daily  . lactose free nutrition  237 mL Oral TID PC  . levothyroxine  75 mcg Oral QAC breakfast  . metoprolol tartrate  25 mg Oral BID  . multivitamin with minerals  1 tablet Oral Daily  . polyethylene glycol  17 g Oral Daily  . potassium chloride  20 mEq Oral Daily  . predniSONE  5 mg Oral Q breakfast  . sodium chloride  10-40 mL Intracatheter Q12H  . sodium chloride  3 mL Intravenous Q12H   Continuous: . sodium chloride    . 0.9 % NaCl with KCl 20 mEq / L 50 mL/hr at 03/26/14 1727   ATF:TDDUKGURKYHCW, acetaminophen,  albuterol, alum & mag hydroxide-simeth, HYDROcodone-acetaminophen, HYDROmorphone (DILAUDID) injection, menthol-cetylpyridinium, ondansetron (ZOFRAN) IV, oxyCODONE-acetaminophen, phenol, sodium chloride, sodium chloride  Assessment/Plan: MSSA infection of wound from recent lumbar surgery: ID recommends minimum 4 weeks, likey 6, of 2g IV daily Rocephion with day #1 being 6/26.  Home IV abx arranged through her PICC which was placed last weekend. NSurg to continue their wound care postoperatively.  Hypothyroidism: Increased her weekly dose to daily which should be easier, as her TSH slightly out of range.  Protein Calorie Malnutrition: Low albumin on today's labs, Nutrition consult appreciated. Boost Plus TID, Needs proper nutrition for wound healing. B12 level normal  Dyspnea: Likely multifactorial including her known valvular heart disease and COPD. Has Grade 2 diastolic dysfunction by Echo so fluid balance must be maintained. Normally followed by [facility], but appears to be behind on her visits, will need f/u as outpatient. Stable on current regimen.  Resolved at present.  COPD: See above, also using incentive spirometer  Confusion: She answers questions appropriately for orientation today. Thyroid being adjusted, B12 level pending.  Postop day #7 per Neurosurgery, wound care  RA: Continue current Prednisone dose.  HTN: Mildly elevated today, continue Cardizem at current dose, pain control as needed.  Continue PT/OT if needed at home.  Will sign off, as discharge  planned for today.  Will also arrange for transition of care visit next week with Dr. Link Snuffer.   LOS: 7 days   Erica Richwine W 03/27/2014, 6:40 AM

## 2014-03-27 NOTE — Progress Notes (Signed)
Patient is discharged from room 4N26 at this time. Alert and in stable condition. PICC site hep locked by IV team. Instructions read to patient and brother and understanding verbalized. Left unit via wheelchair with belongings at side.

## 2014-03-27 NOTE — Progress Notes (Addendum)
CSW called pt's brother and explained CSW has still not heard back from [facility] concerning insurance coverage for SNF. Brother thanked CSW for efforts but states they are going to take pt home and have supports in place for her. CSW signing off.  Addendum: CSW received formal denial for SNF coverage from [facility] case manager.  Maryclare Labrador, MSW, [facility] Clinical Social Worker [phone]

## 2014-03-27 NOTE — Progress Notes (Signed)
LATE ENTRY- IM given 03/26/2014  CARE MANAGEMENT NOTE 03/27/2014  Patient:  Marissa Parsons, Marissa Parsons   Account Number:  [id number]  Date Initiated:  03/22/2014  Documentation initiated by:  Jiles Crocker  Subjective/Objective Assessment:   ADMITTED WITH LUMBAR WOUND INFECTION     Action/Plan:   CM FOLLOWING FOR DCP   Anticipated DC Date:  03/23/2014   Anticipated DC Plan:  HOME W HOME HEALTH SERVICES      DC Planning Services  CM consult      Choice offered to / List presented to:             Operating Room Services agency  [facility]   Status of service:  In process, will continue to follow Medicare Important Message given?  YES (If response is "NO", the following Medicare IM given date fields will be blank) Date Medicare IM given:  03/26/2014 Medicare IM given by:  Jiles Crocker Date Additional Medicare IM given:   Additional Medicare IM given by:    Discharge Disposition:    Per UR Regulation:  Reviewed for med. necessity/level of care/duration of stay  If discussed at Long Length of Stay Meetings, dates discussed:    Comments:  03/22/2014- Patient is active with [facility] as prior to admission; Alexis Goodell [phone]

## 2014-04-01 ENCOUNTER — Other Ambulatory Visit (HOSPITAL_COMMUNITY): Payer: Self-pay | Admitting: Neurological Surgery

## 2014-04-01 ENCOUNTER — Ambulatory Visit (HOSPITAL_COMMUNITY)
Admission: RE | Admit: 2014-04-01 | Discharge: 2014-04-01 | Disposition: A | Discharge status: Home / Self Care | Payer: Medicare HMO | Source: Ambulatory Visit | Attending: Neurological Surgery | Admitting: Neurological Surgery

## 2014-04-01 ENCOUNTER — Other Ambulatory Visit (HOSPITAL_COMMUNITY): Payer: Self-pay | Admitting: Internal Medicine

## 2014-04-01 DIAGNOSIS — T8189XA Other complications of procedures, not elsewhere classified, initial encounter: Secondary | ICD-10-CM

## 2014-04-01 DIAGNOSIS — Y849 Medical procedure, unspecified as the cause of abnormal reaction of the patient, or of later complication, without mention of misadventure at the time of the procedure: Secondary | ICD-10-CM | POA: Diagnosis not present

## 2014-04-01 DIAGNOSIS — T82898A Other specified complication of vascular prosthetic devices, implants and grafts, initial encounter: Secondary | ICD-10-CM | POA: Insufficient documentation

## 2014-04-01 MED ORDER — CHLORHEXIDINE GLUCONATE 4 % EX LIQD
CUTANEOUS | Status: AC
  Filled 2014-04-01: qty 15

## 2014-04-12 ENCOUNTER — Telehealth: Payer: Self-pay | Admitting: *Deleted

## 2014-04-12 ENCOUNTER — Encounter: Payer: Self-pay | Admitting: Internal Medicine

## 2014-04-12 ENCOUNTER — Ambulatory Visit (INDEPENDENT_AMBULATORY_CARE_PROVIDER_SITE_OTHER): Payer: Medicare HMO | Admitting: Internal Medicine

## 2014-04-12 VITALS — BP 137/64 | HR 66 | Temp 97.6°F | Ht 67.0 in | Wt 134.0 lb

## 2014-04-12 DIAGNOSIS — A4901 Methicillin susceptible Staphylococcus aureus infection, unspecified site: Secondary | ICD-10-CM

## 2014-04-12 DIAGNOSIS — T8140XA Infection following a procedure, unspecified, initial encounter: Secondary | ICD-10-CM

## 2014-04-12 DIAGNOSIS — Z9889 Other specified postprocedural states: Secondary | ICD-10-CM

## 2014-04-12 NOTE — Progress Notes (Signed)
Subjective:    Patient ID: Marissa Parsons, female    DOB: Oct 03, 1928, 80 y.o.   MRN: [id number]  HPI JEANINNE BARBANO is a 80 y.o. female with hx of RA, HTN, significant vision impairment from macular degeneration, spinal stenosis status post recent L3-L4 decompressive lumbar laminectomy (03/03/14) who presented to the emergency department with the complaint of nausea/vomiting and fever 103F on 6/24 in addition to draining purulence from her wound. she underwent MRI imaging that showed A loculated fluid collection measuring approximately 6.3 x 5.6 x 2.4 cm seen along the midline incision  with Mild postcontrast enhancement, unclear if post op seroma vs. Infection. This collection abuts the dorsal aspect of the thecal sac at the L3 and L4 levels along the laminectomy site. No epidural abscess. On 6/25 she underwent urgent irrigation and debridement of lumbar wound. OR report stated that once her old incision was opened there was immediate release of purulence and cultures were sent for Gram stain and anaerobic and aerobic culture. the fascia and the musculature were opened. It did not appear that the infection with subfascial. OR cx grew MSSA on 6/25. Blood cx NGTD, urine cx had polymicrobial from improper specimen (too many squamous cells) She was initially started on ceftriaxone and vancomycin. Due to financial constraints, she opted to do home iv antibiotics with her neighbor being the one to administer her IV therapy. She was discharged on ceftriaxone 2gm IV daily. She is currently on day 21 of 6-8wk of therapy. She is tolerating antibiotics without difficulty. Her wound is no longer draining as of last week. Has seen dr. Yetta Barre, pleased with recovery.  Current Outpatient Prescriptions on File Prior to Visit  Medication Sig Dispense Refill  . alendronate (FOSAMAX) 70 MG tablet Take 70 mg by mouth once a week. Take on Monday.Take with a full glass of water on an empty stomach.      Marland Kitchen aspirin EC 81 MG  tablet Take 81 mg by mouth daily.      . bisoprolol-hydrochlorothiazide (ZIAC) 5-6.25 MG per tablet Take 1 tablet by mouth daily.      . cefTRIAXone 2 g in dextrose 5 % 50 mL Inject 2 g into the vein daily.  36 each  0  . diltiazem (CARDIZEM CD) 240 MG 24 hr capsule Take 240 mg by mouth daily.      . folic acid (FOLVITE) 1 MG tablet Take 1 mg by mouth daily.      Marland Kitchen levothyroxine (SYNTHROID, LEVOTHROID) 50 MCG tablet Take 50-100 mcg by mouth See admin instructions. Take 2 tablets (100 mcg) on Monday and Friday, take 1 tablet (50 mcg) on Sunday, Tuesday, Wednesday, Thursday, Saturday      . predniSONE (DELTASONE) 5 MG tablet Take 5 mg by mouth daily.      Marland Kitchen HYDROcodone-acetaminophen (NORCO/VICODIN) 5-325 MG per tablet Take 1-2 tablets by mouth every 4 (four) hours as needed for moderate pain.  100 tablet  0  . methotrexate (RHEUMATREX) 2.5 MG tablet Take 10 mg by mouth 2 (two) times a week. On Tuesdays and Wednesdays       No current facility-administered medications on file prior to visit.   Active Ambulatory Problems    Diagnosis Date Noted  . Hypothyroidism 01/05/2011  . Osteoporosis 01/05/2011  . Macular degeneration 01/05/2011  . Deep vein thrombosis (DVT) 01/05/2011  . Benign hypertensive heart disease without heart failure 01/05/2011  . Osteoarthritis 01/05/2011  . Weight loss, unintentional 04/04/2012  . Pericarditis, acute 07/26/2012  .  Chronic anticoagulation   . Hypokalemia 07/27/2012  . Neutrophilic leukocytosis 07/27/2012  . Hypertension   . PAC (premature atrial contraction) 08/07/2012  . Fracture of multiple pubic rami 01/16/2013  . Lumbar spinal stenosis 03/03/2014  . S/P lumbar laminectomy 03/06/2014  . UTI (lower urinary tract infection) 03/21/2014  . Fever 03/21/2014  . Leukocytosis 03/21/2014  . Hypoxia 03/21/2014  . Status post lumbar laminectomy 03/21/2014  . Wound infection after surgery 03/21/2014   Resolved Ambulatory Problems    Diagnosis Date Noted  .  No Resolved Ambulatory Problems   Past Medical History  Diagnosis Date  . Rheumatoid arteritis   . Phlebitis   . Deep vein thrombophlebitis of leg   . Fractured pelvis   . Thyroid disease   . Humerus fracture   . Thrombocytopenia   . Phlebitis   . Hematoma   . Urinary tract infection   . Humerus fracture   . Dementia        Review of Systems No back pain, no fever,chills, diarrhea, rash  Review of Systems  Constitutional: Negative for fever, chills, diaphoresis, activity change, appetite change, fatigue and unexpected weight change.  HENT: Negative for congestion, sore throat, rhinorrhea, sneezing, trouble swallowing and sinus pressure.  Eyes: Negative for photophobia and visual disturbance.  Respiratory: Negative for cough, chest tightness, shortness of breath, wheezing and stridor.  Cardiovascular: Negative for chest pain, palpitations and leg swelling.  Gastrointestinal: Negative for nausea, vomiting, abdominal pain, diarrhea, constipation, blood in stool, abdominal distention and anal bleeding.  Genitourinary: Negative for dysuria, hematuria, flank pain and difficulty urinating.  Musculoskeletal: Negative for myalgias, back pain, joint swelling, arthralgias and gait problem.  Skin: Negative for color change, pallor, rash and wound.  Neurological: Negative for dizziness, tremors, weakness and light-headedness.  Hematological: Negative for adenopathy. Does not bruise/bleed easily.  Psychiatric/Behavioral: Negative for behavioral problems, confusion, sleep disturbance, dysphoric mood, decreased concentration and agitation.       Objective:   Physical Exam BP 137/64  Pulse 66  Temp(Src) 97.6 F (36.4 C) (Oral)  Ht 5\' 7"  (1.702 m)  Wt 134 lb (60.782 kg)  BMI 20.98 kg/m2 Physical Exam  Constitutional:  oriented to person, place, and time. appears well-developed and well-nourished. No distress.  HENT:  Mouth/Throat: Oropharynx is clear and moist. No oropharyngeal  exudate.  Ext: picc line in right arm is c/d/i. Back = healing surgical incision, superior aspect of surgical incision has prominent swelling no fluctuance, no erythema no tenderness ? Inflammation of tissue. Neurological: alert and oriented to person, place, and time.  Skin: Skin is warm and dry. No rash noted. No erythema.  Psychiatric: a normal mood and affect.  behavior is normal.       Assessment & Plan:  MSSA deep tissue post operative infection of lumbar spine = continue with ceftriaxone 2gm IV daily. 6 wk would end on aug 7th, and we will check her sed rate and crp. If still elevated then we will recommend to do 2 addn wks of IV antibiotics  For a total of 8 wks then transition her to keflex 500mg  TID x 4 wks  rtc in 5 wk

## 2014-04-12 NOTE — Telephone Encounter (Signed)
Gave verbal order for labs (ESR, CRP) to be drawn 8/3.  Tentative stop date for IV antibiotics and PICC pull date is 8/7.  Lab results from 8/3 will confirm this. Landis Gandy, RN

## 2014-04-18 ENCOUNTER — Emergency Department (HOSPITAL_COMMUNITY): Payer: Medicare HMO

## 2014-04-18 ENCOUNTER — Encounter (HOSPITAL_COMMUNITY): Payer: Self-pay | Admitting: Emergency Medicine

## 2014-04-18 ENCOUNTER — Emergency Department (HOSPITAL_COMMUNITY)
Admission: EM | Admit: 2014-04-18 | Discharge: 2014-04-18 | Disposition: A | Discharge status: Home / Self Care | Payer: Medicare HMO | Attending: Emergency Medicine | Admitting: Emergency Medicine

## 2014-04-18 DIAGNOSIS — Z8781 Personal history of (healed) traumatic fracture: Secondary | ICD-10-CM | POA: Diagnosis not present

## 2014-04-18 DIAGNOSIS — IMO0002 Reserved for concepts with insufficient information to code with codable children: Secondary | ICD-10-CM | POA: Diagnosis not present

## 2014-04-18 DIAGNOSIS — Z87891 Personal history of nicotine dependence: Secondary | ICD-10-CM | POA: Diagnosis not present

## 2014-04-18 DIAGNOSIS — S0083XA Contusion of other part of head, initial encounter: Secondary | ICD-10-CM | POA: Insufficient documentation

## 2014-04-18 DIAGNOSIS — H3530 Unspecified macular degeneration: Secondary | ICD-10-CM | POA: Diagnosis not present

## 2014-04-18 DIAGNOSIS — S1093XA Contusion of unspecified part of neck, initial encounter: Secondary | ICD-10-CM

## 2014-04-18 DIAGNOSIS — M1990 Unspecified osteoarthritis, unspecified site: Secondary | ICD-10-CM | POA: Diagnosis not present

## 2014-04-18 DIAGNOSIS — Z792 Long term (current) use of antibiotics: Secondary | ICD-10-CM | POA: Diagnosis not present

## 2014-04-18 DIAGNOSIS — Z862 Personal history of diseases of the blood and blood-forming organs and certain disorders involving the immune mechanism: Secondary | ICD-10-CM | POA: Diagnosis not present

## 2014-04-18 DIAGNOSIS — Y9301 Activity, walking, marching and hiking: Secondary | ICD-10-CM | POA: Diagnosis not present

## 2014-04-18 DIAGNOSIS — Y9289 Other specified places as the place of occurrence of the external cause: Secondary | ICD-10-CM | POA: Insufficient documentation

## 2014-04-18 DIAGNOSIS — W1809XA Striking against other object with subsequent fall, initial encounter: Secondary | ICD-10-CM | POA: Diagnosis not present

## 2014-04-18 DIAGNOSIS — Z8659 Personal history of other mental and behavioral disorders: Secondary | ICD-10-CM | POA: Diagnosis not present

## 2014-04-18 DIAGNOSIS — Z880 Allergy status to penicillin: Secondary | ICD-10-CM | POA: Diagnosis not present

## 2014-04-18 DIAGNOSIS — Z7901 Long term (current) use of anticoagulants: Secondary | ICD-10-CM | POA: Insufficient documentation

## 2014-04-18 DIAGNOSIS — E039 Hypothyroidism, unspecified: Secondary | ICD-10-CM | POA: Diagnosis not present

## 2014-04-18 DIAGNOSIS — F0390 Unspecified dementia without behavioral disturbance: Secondary | ICD-10-CM | POA: Insufficient documentation

## 2014-04-18 DIAGNOSIS — Z7983 Long term (current) use of bisphosphonates: Secondary | ICD-10-CM | POA: Diagnosis not present

## 2014-04-18 DIAGNOSIS — Z86718 Personal history of other venous thrombosis and embolism: Secondary | ICD-10-CM | POA: Insufficient documentation

## 2014-04-18 DIAGNOSIS — S0003XA Contusion of scalp, initial encounter: Secondary | ICD-10-CM | POA: Insufficient documentation

## 2014-04-18 DIAGNOSIS — Z79899 Other long term (current) drug therapy: Secondary | ICD-10-CM | POA: Diagnosis not present

## 2014-04-18 DIAGNOSIS — Z8672 Personal history of thrombophlebitis: Secondary | ICD-10-CM | POA: Diagnosis not present

## 2014-04-18 DIAGNOSIS — S0180XA Unspecified open wound of other part of head, initial encounter: Secondary | ICD-10-CM | POA: Insufficient documentation

## 2014-04-18 DIAGNOSIS — I10 Essential (primary) hypertension: Secondary | ICD-10-CM | POA: Insufficient documentation

## 2014-04-18 DIAGNOSIS — S0990XA Unspecified injury of head, initial encounter: Secondary | ICD-10-CM | POA: Insufficient documentation

## 2014-04-18 DIAGNOSIS — Z7982 Long term (current) use of aspirin: Secondary | ICD-10-CM | POA: Insufficient documentation

## 2014-04-18 DIAGNOSIS — M069 Rheumatoid arthritis, unspecified: Secondary | ICD-10-CM | POA: Diagnosis not present

## 2014-04-18 DIAGNOSIS — Z87440 Personal history of urinary (tract) infections: Secondary | ICD-10-CM | POA: Diagnosis not present

## 2014-04-18 NOTE — Discharge Instructions (Signed)
Suture removal 7-10 days. ° °Laceration Care, Adult °A laceration is a cut that goes through all layers of the skin. The cut goes into the tissue beneath the skin. °HOME CARE °For stitches (sutures) or staples: °· Keep the cut clean and dry. °· If you have a bandage (dressing), change it at least once a day. Change the bandage if it gets wet or dirty, or as told by your doctor. °· Wash the cut with soap and water 2 times a day. Rinse the cut with water. Pat it dry with a clean towel. °· Put a thin layer of medicated cream on the cut as told by your doctor. °· You may shower after the first 24 hours. Do not soak the cut in water until the stitches are removed. °· Only take medicines as told by your doctor. °· Have your stitches or staples removed as told by your doctor. °For skin adhesive strips: °· Keep the cut clean and dry. °· Do not get the strips wet. You may take a bath, but be careful to keep the cut dry. °· If the cut gets wet, pat it dry with a clean towel. °· The strips will fall off on their own. Do not remove the strips that are still stuck to the cut. °For wound glue: °· You may shower or take baths. Do not soak or scrub the cut. Do not swim. Avoid heavy sweating until the glue falls off on its own. After a shower or bath, pat the cut dry with a clean towel. °· Do not put medicine on your cut until the glue falls off. °· If you have a bandage, do not put tape over the glue. °· Avoid lots of sunlight or tanning lamps until the glue falls off. Put sunscreen on the cut for the first year to reduce your scar. °· The glue will fall off on its own. Do not pick at the glue. °You may need a tetanus shot if: °· You cannot remember when you had your last tetanus shot. °· You have never had a tetanus shot. °If you need a tetanus shot and you choose not to have one, you may get tetanus. Sickness from tetanus can be serious. °GET HELP RIGHT AWAY IF:  °· Your pain does not get better with medicine. °· Your arm, hand,  leg, or foot loses feeling (numbness) or changes color. °· Your cut is bleeding. °· Your joint feels weak, or you cannot use your joint. °· You have painful lumps on your body. °· Your cut is red, puffy (swollen), or painful. °· You have a red line on the skin near the cut. °· You have yellowish-white fluid (pus) coming from the cut. °· You have a fever. °· You have a bad smell coming from the cut or bandage. °· Your cut breaks open before or after stitches are removed. °· You notice something coming out of the cut, such as wood or glass. °· You cannot move a finger or toe. °MAKE SURE YOU:  °· Understand these instructions. °· Will watch your condition. °· Will get help right away if you are not doing well or get worse. °Document Released: 03/01/2008 Document Revised: 12/06/2011 Document Reviewed: 03/09/2011 °Exit[employer]® Patient Information ©2015 [employer]. This information is not intended to replace advice given to you by your health care provider. Make sure you discuss any questions you have with your health care provider. ° °

## 2014-04-18 NOTE — ED Notes (Addendum)
Pt in with family stating she fell this morning and hit her head, laceration above left eye, also c/o mild bleeding from PICC line sight in right upper arm and family would like that evaluated, no active bleeding at this time. Pt denies pain or complaints.

## 2014-04-18 NOTE — ED Notes (Signed)
IV team at bedside 

## 2014-04-18 NOTE — ED Notes (Signed)
IV team paged to do dressing change.

## 2014-04-18 NOTE — ED Provider Notes (Signed)
CSN: [id number]     Arrival date & time 04/18/14  1631 History   First MD Initiated Contact with Patient 04/18/14 1712     Chief Complaint  Patient presents with  . Fall  . Vascular Access Problem      HPI  Patient presents with a fall and a laceration above her left eye. Significant history of lumbar surgery that required reexploration for infection. Getting antibiotics via her PICC line for several weeks. Was going somewhere with her friend. Walked out to her porch with her walker. Then went back inside without her walker to put her dog outside. Lost her balance and fell. She struck her head against the television. No loss of consciousness. No neck pain. No peripheral neurological symptoms. Awake alert oriented lucid upon her arrival here.  Past Medical History  Diagnosis Date  . Hypertension   . Rheumatoid arteritis   . Phlebitis   . Deep vein thrombophlebitis of leg   . Fractured pelvis     fall 2011  . Thyroid disease     hypothyroidism  . Humerus fracture   . Thrombocytopenia   . Leukocytosis   . Hypothyroidism   . Phlebitis      Recurrent phlebitis  . Hematoma      Right superior and inferior pubic rami fractures with hematoma adjacent to the right ramus fracture  . Urinary tract infection   . Humerus fracture      Right proximal humerus fracture  . Osteoarthritis     for which she takes chronic prednisone  . Macular degeneration   . Chronic anticoagulation   . Fracture of multiple pubic rami 01/16/2013  . Dementia     MILD   Past Surgical History  Procedure Laterality Date  . Left hip open reduction    . Cholecystectomy    . Abdominal hysterectomy  s  . Sinus surgery with instatrak    . Lumbar laminectomy/decompression microdiscectomy N/A 03/06/2014    Procedure: LUMBAR LAMINECTOMY/DECOMPRESSION LUMBAR THREE, FOUR, AND FIVE ;  Surgeon: Eustace Moore, MD;  Location: [facility];  Service: Neurosurgery;  Laterality: N/A;  LUMBAR LAMINECTOMY/DECOMPRESSION LUMBAR  THREE, FOUR, AND FIVE   . Lumbar wound debridement N/A 03/21/2014    Procedure: Incision and drainage of lumbar wound;  Surgeon: Eustace Moore, MD;  Location: [place] [facility];  Service: Neurosurgery;  Laterality: N/A;   Family History  Problem Relation Age of Onset  . Abnormal EKG Neg Hx   . Abnormal newborn screen Neg Hx   . Achalasia Neg Hx   . Achondroplasia Neg Hx   . Acne Neg Hx   . Acromegaly Neg Hx   . Actinic keratosis Neg Hx   . Acute lymphoblastic leukemia Neg Hx   . Addison's disease Neg Hx   . Adrenal disorder Neg Hx   . Albinism Neg Hx   . Alcohol abuse Neg Hx   . Alkaptonuria Neg Hx   . Allergic rhinitis Neg Hx   . Allergies Neg Hx   . Allergy (severe) Neg Hx   . Alopecia Neg Hx   . Alpha-1 antitrypsin deficiency Neg Hx   . Alport syndrome Neg Hx   . ALS Neg Hx   . Alzheimer's disease Neg Hx   . Ambiguous genitalia Neg Hx   . Amblyopia Neg Hx   . Amenorrhea Neg Hx   . Anal fissures Neg Hx   . Anemia Neg Hx   . Anesthesia problems Neg Hx   .  Aneurysm Neg Hx   . Angelman syndrome Neg Hx   . Angina Neg Hx   . Angioedema Neg Hx   . Ankylosing spondylitis Neg Hx   . Anorectal malformation Neg Hx   . Anorexia nervosa Neg Hx   . Anti-cardiolipin syndrome Neg Hx   . Antithrombin III deficiency Neg Hx   . Anuerysm Neg Hx   . Anxiety disorder Neg Hx   . Aortic aneurysm Neg Hx   . Aortic dissection Neg Hx   . Aortic stenosis Neg Hx   . Aphthous stomatitis Neg Hx   . Aplastic anemia Neg Hx   . Appendicitis Neg Hx   . Apraxia Neg Hx   . Arnold-Chiari malformation Neg Hx   . Arrhythmia Neg Hx   . Arthritis Neg Hx   . Asperger's syndrome Neg Hx   . Asthma Neg Hx   . Ataxia Neg Hx   . Ataxia telangiectasia Neg Hx   . Atopy Neg Hx   . Atrial fibrillation Neg Hx   . Atrophic kidney Neg Hx   . Auditory processing disorder Neg Hx   . Autism Neg Hx   . Autism spectrum disorder Neg Hx   . Autoimmune disease Neg Hx   . AVM Neg Hx   . Baker's cyst Neg Hx   .  Barrett's esophagus Neg Hx   . Bartter's syndrome Neg Hx   . Basal cell carcinoma Neg Hx   . Behavior problems Neg Hx   . Bell's palsy Neg Hx   . Benign prostatic hyperplasia Neg Hx   . Bipolar disorder Neg Hx   . Birth defects Neg Hx   . Birth marks Neg Hx   . Blindness Neg Hx   . Bone cancer Neg Hx   . BOR syndrome Neg Hx   . Bow legs Neg Hx   . Bradycardia Neg Hx   . Brain cancer Neg Hx   . BRCA 1/2 Neg Hx   . Breast cancer Neg Hx   . Broken bones Neg Hx   . Bronchiolitis Neg Hx   . Bronchopulmonary dysplasia Neg Hx   . Bruton's disease Neg Hx   . Bulemia Neg Hx   . Bullous pemphigoid Neg Hx   . Bunion Neg Hx   . Bursitis Neg Hx   . Caf-au-lait spots Neg Hx   . Calcium disorder Neg Hx   . Canavan disease Neg Hx   . Cancer Neg Hx   . Cardiomyopathy Neg Hx   . Carpal tunnel syndrome Neg Hx   . Cataracts Neg Hx   . Celiac disease Neg Hx   . Cerebral aneurysm Neg Hx   . Cerebral palsy Neg Hx   . Cervical cancer Neg Hx   . Cervical polyp Neg Hx   . Cervicitis Neg Hx   . Chalasia Neg Hx   . Chalazion Neg Hx   . Charcot-Marie-Tooth disease Neg Hx   . Chediak-Higashi syndrome Neg Hx   . Chiari malformation Neg Hx   . Childhood respiratory disease Neg Hx   . Choanal atresia Neg Hx   . Cholecystitis Neg Hx   . Cholelithiasis Neg Hx   . Cholesteatoma Neg Hx   . Chondromalacia Neg Hx   . Chorea Neg Hx   . Chromosomal disorder Neg Hx   . Chronic bronchitis Neg Hx   . Chronic fatigue Neg Hx   . Chronic granulomatous disease Neg Hx   . Chronic infections Neg Hx   .  Cirrhosis Neg Hx   . Cleft lip Neg Hx   . Cleft palate Neg Hx   . Clotting disorder Neg Hx   . Club foot Neg Hx   . Coarctation of the aorta Neg Hx   . Colitis Neg Hx   . Collagen disease Neg Hx   . Colon cancer Neg Hx   . Colon polyps Neg Hx   . Colonic polyp Neg Hx   . Color blindness Neg Hx   . Conduct disorder Neg Hx   . Conductive hearing loss Neg Hx   . Congenital adrenal hyperplasia Neg Hx     . Congenital heart disease Neg Hx   . Conjunctivitis Neg Hx   . Consanguinity Neg Hx   . Constipation Neg Hx   . COPD Neg Hx   . Corneal abrasion Neg Hx   . Corneal ulcer Neg Hx   . Coronary aneurysm Neg Hx   . Coronary artery disease Neg Hx   . Cowden syndrome Neg Hx   . Craniosynostosis Neg Hx   . Cri-du-chat syndrome Neg Hx   . Crohn's disease Neg Hx   . Cushing syndrome Neg Hx   . Cystic fibrosis Neg Hx   . Cystic kidney disease Neg Hx   . Cystinosis Neg Hx   . Decreased libido Neg Hx   . Deep vein thrombosis Neg Hx   . Delayed menopause Neg Hx   . Delayed puberty Neg Hx   . Dementia Neg Hx   . Dental caries Neg Hx   . Depression Neg Hx   . Dermatomyositis Neg Hx   . DES usage Neg Hx   . Developmental delay Neg Hx   . Diabetes Neg Hx   . Diabetes insipidus Neg Hx   . Diabetes type I Neg Hx   . Diabetes type II Neg Hx   . Diabetic kidney disease Neg Hx   . DiGeorge syndrome Neg Hx   . Dilated cardiomyopathy Neg Hx   . Dislocations Neg Hx   . Diverticulitis Neg Hx   . Diverticulosis Neg Hx   . Down syndrome Neg Hx   . Drug abuse Neg Hx   . Dupuytren's contracture Neg Hx   . Dwarfism Neg Hx   . Dysfunctional uterine bleeding Neg Hx   . Dysmenorrhea Neg Hx   . Dyspareunia Neg Hx   . Dysphagia Neg Hx   . Dysrhythmia Neg Hx   . Dystonia Neg Hx   . Early death Neg Hx   . Early menopause Neg Hx   . Early puberty Neg Hx   . Eating disorder Neg Hx   . Eclampsia Neg Hx   . Ectodermal dysplasia Neg Hx   . Eczema Neg Hx   . Edema Neg Hx   . Edward's syndrome Neg Hx   . Ehlers-Danlos syndrome Neg Hx   . Emotional abuse Neg Hx   . Emphysema Neg Hx   . Encopresis Neg Hx   . Endocrine tumor Neg Hx   . Endocrinopathy Neg Hx   . Endometrial cancer Neg Hx   . Endometriosis Neg Hx   . Enuresis Neg Hx   . Eosinophilic granuloma Neg Hx   . Epididymitis Neg Hx   . Erectile dysfunction Neg Hx   . Erythema nodosum Neg Hx   . Esophageal cancer Neg Hx   . Esophageal  varices Neg Hx   . Esophagitis Neg Hx   . Exostosis Neg Hx   . Fabry's  disease Neg Hx   . Factor IX deficiency Neg Hx   . Factor V Leiden deficiency Neg Hx   . Factor VIII deficiency Neg Hx   . Failure to thrive Neg Hx   . Fainting Neg Hx   . Familial dysautonomia Neg Hx   . Familial nephritis Neg Hx   . Familial polyposis Neg Hx   . Fanconi anemia Neg Hx   . Febrile seizures Neg Hx   . Fibrocystic breast disease Neg Hx   . Fibroids Neg Hx   . Fibromyalgia Neg Hx   . Food intolerance Neg Hx   . Fragile X syndrome Neg Hx   . Friedreich's ataxia Neg Hx   . Frontotemporal dementia Neg Hx   . Fuch's dystrophy Neg Hx   . Gait disorder Neg Hx   . Galactosemia Neg Hx   . Gallbladder disease Neg Hx   . Gaucher's disease Neg Hx   . Genodermatoses Neg Hx   . GER disease Neg Hx   . Gestational diabetes Neg Hx   . GI problems Neg Hx   . Glaucoma Neg Hx   . Glomerulonephritis Neg Hx   . Glucose-6-phos deficiency Neg Hx   . Glycogen storage disease Neg Hx   . Goiter Neg Hx   . Gonadal disorder Neg Hx   . Gout Neg Hx   . Graves' disease Neg Hx   . Growth hormone deficiency Neg Hx   . GU problems Neg Hx   . Hammer toes Neg Hx   . Hartnup's disease Neg Hx   . Hashimoto's thyroiditis Neg Hx   . Healthy Neg Hx   . Hearing loss Neg Hx   . Heart block Neg Hx   . Heart defect Neg Hx   . Heart disease Neg Hx   . Heart failure Neg Hx   . Heart murmur Neg Hx   . Hemangiomas Neg Hx   . Hematuria Neg Hx   . Hemochromatosis Neg Hx   . Hemolytic uremic syndrome Neg Hx   . Hemophilia Neg Hx   . Henoch-Schonlein purpura Neg Hx   . Hepatitis Neg Hx   . Hepatomegaly Neg Hx   . Hereditary spherocytosis Neg Hx   . Hernia Neg Hx   . Hiatal hernia Neg Hx   . High arches Neg Hx   . Hip dysplasia Neg Hx   . Hip fracture Neg Hx   . Hirschsprung's disease Neg Hx   . Hirsutism Neg Hx   . Histiocytosis X Neg Hx   . HIV Neg Hx   . HLA-B27 positive Neg Hx   . Hodgkin's lymphoma Neg Hx   .  Homocystinuria Neg Hx   . Hunter's disease Neg Hx   . Huntington's disease Neg Hx   . Hydrocele Neg Hx   . Hydrocephalus Neg Hx   . Hygroma Neg Hx   . Hypercalcemia Neg Hx   . Hypereosinophilic syndrome Neg Hx   . Hyperinsulinemia Neg Hx   . Hyperkalemia Neg Hx   . Hyperlipidemia Neg Hx   . Hypermobility Neg Hx   . Hypernasality Neg Hx   . Hyperopia Neg Hx   . Hyperparathyroidism Neg Hx   . Hypersomnolence Neg Hx   . Hypertension Neg Hx   . Hyperthyroidism Neg Hx   . Hypertrophic cardiomyopathy Neg Hx   . Hypokalemia Neg Hx   . Hypoparathyroidism Neg Hx   . Hypoplastic kidney Neg Hx   . Hypotension Neg Hx   .  Hypothyroidism Neg Hx   . Idiopathic pulmonary fibrosis Neg Hx   . Idiopathic torsion dystonia Neg Hx   . IgA nephropathy Neg Hx   . Immunodeficiency Neg Hx   . Imperforate anus Neg Hx   . Impotence Neg Hx   . Impulse control disorder Neg Hx   . Incompetent cervix Neg Hx   . Infertility Neg Hx   . Inflammatory bowel disease Neg Hx   . Inguinal hernia Neg Hx   . Insomnia Neg Hx   . Insulin resistance Neg Hx   . Interstitial cystitis Neg Hx   . Intestinal malrotation Neg Hx   . Intestinal polyp Neg Hx   . Intracerebral hemorrhage Neg Hx   . Iron deficiency Neg Hx   . Irregular heart beat Neg Hx   . Irritable bowel syndrome Neg Hx   . Jaundice Neg Hx   . Job's syndrome Neg Hx   . Joint hypermobility Neg Hx   . Juvenile idiopathic arthritis Neg Hx   . Kartagener's syndrome Neg Hx   . Kawasaki disease Neg Hx   . Keloids Neg Hx   . Kennedy's disease Neg Hx   . Keratoconus Neg Hx   . Kidney cancer Neg Hx   . Kidney disease Neg Hx   . Kidney failure Neg Hx   . Kidney nephrosis Neg Hx   . Klinefelter's syndrome Neg Hx   . Krabbe disease Neg Hx   . Kyphosis Neg Hx   . Labyrinthitis Neg Hx   . Lactose intolerance Neg Hx   . Language disorder Neg Hx   . Lead poisoning Neg Hx   . Learning disabilities Neg Hx   . Legg-Calve-Perthes disease Neg Hx   .  Lesch-Nyhan syndrome Neg Hx   . Leukemia Neg Hx   . Lichen planus Neg Hx   . Li-Fraumeni syndrome Neg Hx   . Liver cancer Neg Hx   . Liver disease Neg Hx   . Long QT syndrome Neg Hx   . Lumbar disc disease Neg Hx   . Lung cancer Neg Hx   . Lung disease Neg Hx   . Lupus Neg Hx   . Lymphoma Neg Hx   . Macrocephaly Neg Hx   . Macrosomia Neg Hx   . Macular degeneration Neg Hx   . Malignant hypertension Neg Hx   . Malignant hyperthermia Neg Hx   . Maple syrup urine disease Neg Hx   . Marfan syndrome Neg Hx   . Mastocytosis Neg Hx   . Melanoma Neg Hx   . Memory loss Neg Hx   . Meniere's disease Neg Hx   . Menstrual problems Neg Hx   . Mental illness Neg Hx   . Mental retardation Neg Hx   . Metabolic syndrome Neg Hx   . Microcephaly Neg Hx   . Migraines Neg Hx   . Milk intolerance Neg Hx   . Miscarriages / Stillbirths Neg Hx   . Mitochondrial disorder Neg Hx   . Mitral valve prolapse Neg Hx   . Motor neuron disease Neg Hx   . Movement disorder Neg Hx   . Moyamoya disease Neg Hx   . Multiple births Neg Hx   . Multiple endocrine neoplasia Neg Hx   . Multiple fractures Neg Hx   . Multiple myeloma Neg Hx   . Multiple sclerosis Neg Hx   . Muscle cancer Neg Hx   . Muscular dystrophy Neg Hx   . Myasthenia  gravis Neg Hx   . Myelodysplastic syndrome Neg Hx   . Myocarditis Neg Hx   . Myoclonus Neg Hx   . Myopathy Neg Hx   . Nail disease Neg Hx   . Narcolepsy Neg Hx   . Nephritis Neg Hx   . Nephrolithiasis Neg Hx   . Nephrotic syndrome Neg Hx   . Neural tube defect Neg Hx   . Neurodegenerative disease Neg Hx   . Neurofibromatosis Neg Hx   . Neuromuscular disorder Neg Hx   . Neuropathy Neg Hx   . Neutropenia Neg Hx   . Nevi Neg Hx   . Niemann-Pick disease Neg Hx   . Night blindness Neg Hx   . Nocturnal enuresis Neg Hx   . Nystagmus Neg Hx   . Obesity Neg Hx   . OCD Neg Hx   . ODD Neg Hx   . Orchitis Neg Hx   . Osler-Weber-Rendu syndrome Neg Hx   . Osteoarthritis Neg  Hx   . Osteochondroma Neg Hx   . Osteogenesis imperfecta Neg Hx   . Osteopenia Neg Hx   . Osteoporosis Neg Hx   . Osteosarcoma Neg Hx   . Osteosclerosis Neg Hx   . Other Neg Hx   . Otitis media Neg Hx   . Ovarian cancer Neg Hx   . Ovarian cysts Neg Hx   . Oxalosis Neg Hx   . Paget's disease of bone Neg Hx   . Pancreatic cancer Neg Hx   . Pancreatitis Neg Hx   . Panhypopituitarism Neg Hx   . Panic disorder Neg Hx   . Paranoid behavior Neg Hx   . Parasomnia Neg Hx   . Parkinsonism Neg Hx   . Patau's syndrome Neg Hx   . Pathological gambling Neg Hx   . PDD Neg Hx   . Pectus carinatum Neg Hx   . Pectus excavatum Neg Hx   . Pelvic inflammatory disease Neg Hx   . Pemphigus vulgaris Neg Hx   . Penile cancer Neg Hx   . Periodic limb movement Neg Hx   . Peripheral vascular disease Neg Hx   . Pernicious anemia Neg Hx   . Personality disorder Neg Hx   . Pes cavus Neg Hx   . Physical abuse Neg Hx   . Polo Riley sequence Neg Hx   . PKU Neg Hx   . Plantar fasciitis Neg Hx   . Pleurisy Neg Hx   . Pneumonia Neg Hx   . Polychondritis Neg Hx   . Polycystic kidney disease Neg Hx   . Polycystic ovary syndrome Neg Hx   . Polycythemia Neg Hx   . Polymyalgia rheumatica Neg Hx   . Polymyositis Neg Hx   . Pompe disease Neg Hx   . Positive PPD/TB Exposure Neg Hx   . Posterior urethral valves Neg Hx   . Post-traumatic stress disorder Neg Hx   . Prader-Willi syndrome Neg Hx   . Premature birth Neg Hx   . Premature ovarian failure Neg Hx   . Preterm labor Neg Hx   . Prolactinoma Neg Hx   . Prostate cancer Neg Hx   . Prostatitis Neg Hx   . Protein C deficiency Neg Hx   . Protein S deficiency Neg Hx   . Proteinuria Neg Hx   . Prune belly syndrome Neg Hx   . Pruritis Neg Hx   . Pseudochol deficiency Neg Hx   . Pseudotumor cerebri Neg Hx   .  Psoriasis Neg Hx   . Psychosis Neg Hx   . Ptosis Neg Hx   . Pulmonary embolism Neg Hx   . Pulmonary fibrosis Neg Hx   . Pyelonephritis Neg  Hx   . Pyloric stenosis Neg Hx   . Pyruvate dehydrogenase deficiency Neg Hx   . Rashes / Skin problems Neg Hx   . Raynaud syndrome Neg Hx   . Rectal cancer Neg Hx   . Recurrent abdominal pain Neg Hx   . Reiter's syndrome Neg Hx   . Renal tubular acidosis Neg Hx   . Restless legs syndrome Neg Hx   . Retinal degeneration Neg Hx   . Retinal detachment Neg Hx   . Retinitis pigmentosa Neg Hx   . Retinoblastoma Neg Hx   . Retinopathy of prematurity Neg Hx   . Reye's syndrome Neg Hx   . Rheum arthritis Neg Hx   . Rheumatic fever Neg Hx   . Rheumatologic disease Neg Hx   . Rickets Neg Hx   . Rosacea Neg Hx   . Sacroiliitis Neg Hx   . Sarcoidosis Neg Hx   . Schizophrenia Neg Hx   . Scleritis Neg Hx   . Scleroderma Neg Hx   . Scoliosis Neg Hx   . Seasonal affective disorder Neg Hx   . Seizures Neg Hx   . Selective mutism Neg Hx   . Sensorineural hearing loss Neg Hx   . Severe combined immunodeficiency Neg Hx   . Severe sprains Neg Hx   . Sexual abuse Neg Hx   . Short stature Neg Hx   . Sick sinus syndrome Neg Hx   . Sickle cell anemia Neg Hx   . Sickle cell trait Neg Hx   . SIDS Neg Hx   . Single kidney Neg Hx   . Sinusitis Neg Hx   . Sjogren's syndrome Neg Hx   . Skeletal dysplasia Neg Hx   . Skin cancer Neg Hx   . Skin telangiectasia Neg Hx   . Sleep apnea Neg Hx   . Sleep disorder Neg Hx   . Sleep walking Neg Hx   . Snoring Neg Hx   . Social phobia Neg Hx   . Speech disorder Neg Hx   . Spina bifida Neg Hx   . Spinal muscular atrophy Neg Hx   . Splenomegaly Neg Hx   . Spondyloarthropathy Neg Hx   . Spondylolisthesis Neg Hx   . Spondylolysis Neg Hx   . Squamous cell carcinoma Neg Hx   . Stevens-Johnson syndrome Neg Hx   . Stickler syndrome Neg Hx   . Stomach cancer Neg Hx   . Strabismus Neg Hx   . Stroke Neg Hx   . Stuttering Neg Hx   . Subarachnoid hemorrhage Neg Hx   . Sudden death Neg Hx   . Suicidality Neg Hx   . Supraventricular tachycardia Neg Hx   .  Swallowing difficulties Neg Hx   . Tall stature Neg Hx   . Tay-Sachs disease Neg Hx   . Testicular cancer Neg Hx   . Thalassemia Neg Hx   . Throat cancer Neg Hx   . Thrombocytopenia Neg Hx   . Thrombophilia Neg Hx   . Thrombophlebitis Neg Hx   . Thrombosis Neg Hx   . Thymic aplasia Neg Hx   . Thyroid cancer Neg Hx   . Thyroid disease Neg Hx   . Thyroid nodules Neg Hx   . Tics Neg Hx   .  Tongue cancer Neg Hx   . Torticollis Neg Hx   . Tourette syndrome Neg Hx   . Tracheal cancer Neg Hx   . Tracheoesophageal fistual Neg Hx   . Tracheomalacia Neg Hx   . Transient ischemic attack Neg Hx   . Treacher Collins syndrome Neg Hx   . Tremor Neg Hx   . Tuberculosis Neg Hx   . Tuberous sclerosis Neg Hx   . Turner syndrome Neg Hx   . Ulcerative colitis Neg Hx   . Ulcers Neg Hx   . Undescended testes Neg Hx   . Unexplained death Neg Hx   . Urinary tract infection Neg Hx   . Urolithiasis Neg Hx   . Urticaria Neg Hx   . Uterine cancer Neg Hx   . Uveitis Neg Hx   . Vaginal cancer Neg Hx   . Valvular heart disease Neg Hx   . Vasculitis Neg Hx   . Velocardiofacial syndrome Neg Hx   . Venous thrombosis Neg Hx   . Verruca vulgaris  Neg Hx   . Vesicoureteral reflux Neg Hx   . Vision loss Neg Hx   . Vitamin D deficiency Neg Hx   . Vitiligo Neg Hx   . Voice disorder Neg Hx   . Von Gierke's disease Neg Hx   . Von Hippel-Lindau syndrome Neg Hx   . Von Willebrand disease Neg Hx   . Werdnig Hoffman Disease Neg Hx   . Williams syndrome Neg Hx   . Wilm's tumor Neg Hx   . Wilson's disease Neg Hx   . Wiskott-Aldrich syndrome Neg Hx   . Yves Dill Parkinson White syndrome Neg Hx   . Xeroderma pigmentosa Neg Hx   . Heart attack Mother   . Heart attack Father    History  Substance Use Topics  . Smoking status: Former Research scientist (life sciences)  . Smokeless tobacco: Never Used  . Alcohol Use: No   OB History   Grav Para Term Preterm Abortions TAB SAB Ect Mult Living                 Review of Systems    Constitutional: Negative for fever, chills, diaphoresis, appetite change and fatigue.  HENT: Negative for mouth sores, sore throat and trouble swallowing.   Eyes: Negative for visual disturbance.  Respiratory: Negative for cough, chest tightness, shortness of breath and wheezing.   Cardiovascular: Negative for chest pain.  Gastrointestinal: Negative for nausea, vomiting, abdominal pain, diarrhea and abdominal distention.  Endocrine: Negative for polydipsia, polyphagia and polyuria.  Genitourinary: Negative for dysuria, frequency and hematuria.  Musculoskeletal: Negative for gait problem.  Skin: Positive for wound. Negative for color change, pallor and rash.  Neurological: Negative for dizziness, syncope, light-headedness and headaches.  Hematological: Does not bruise/bleed easily.  Psychiatric/Behavioral: Negative for behavioral problems and confusion.      Allergies  Morphine and related; Tramadol; and Penicillins  Home Medications   Prior to Admission medications   Medication Sig Start Date End Date Taking? Authorizing Provider  alendronate (FOSAMAX) 70 MG tablet Take 70 mg by mouth once a week. Take on Monday.Take with a full glass of water on an empty stomach.   Yes Historical Provider, MD  aspirin EC 81 MG tablet Take 81 mg by mouth daily.   Yes Historical Provider, MD  bisoprolol-hydrochlorothiazide (ZIAC) 5-6.25 MG per tablet Take 1 tablet by mouth daily.   Yes Historical Provider, MD  calcium citrate-vitamin D (CITRACAL+D) 315-200 MG-UNIT per tablet Take 1 tablet by  mouth daily.   Yes Historical Provider, MD  cefTRIAXone 2 g in dextrose 5 % 50 mL Inject 2 g into the vein daily. 03/26/14  Yes Carlyle Basques, MD  diazepam (VALIUM) 5 MG tablet Take 5 mg by mouth at bedtime as needed for anxiety or muscle spasms.  01/16/14  Yes Historical Provider, MD  diltiazem (CARDIZEM CD) 240 MG 24 hr capsule Take 240 mg by mouth daily. 11/08/12  Yes Darlin Coco, MD  folic acid (FOLVITE) 1  MG tablet Take 1 mg by mouth daily.   Yes Historical Provider, MD  levothyroxine (SYNTHROID, LEVOTHROID) 50 MCG tablet Take 50-100 mcg by mouth See admin instructions. Take 1 tablets (50 mcg) on Monday and Friday, take 2 tablet (100 mcg) on Sunday, Tuesday, Wednesday, Thursday, Saturday 11/12/11  Yes Darlin Coco, MD  methotrexate (RHEUMATREX) 2.5 MG tablet Take 10 mg by mouth 2 (two) times a week. On Tuesdays and Wednesdays 01/18/14  Yes Historical Provider, MD  predniSONE (DELTASONE) 5 MG tablet Take 5 mg by mouth daily.   Yes Historical Provider, MD   BP 139/54  Pulse 72  Temp(Src) 97.4 F (36.3 C) (Oral)  Resp 17  SpO2 98% Physical Exam  Constitutional: She is oriented to person, place, and time. She appears well-developed and well-nourished. No distress.  HENT:  Head: Normocephalic.    Eyes: Conjunctivae are normal. Pupils are equal, round, and reactive to light. No scleral icterus.    Neck: Normal range of motion. Neck supple. No thyromegaly present.  Cardiovascular: Normal rate and regular rhythm.  Exam reveals no gallop and no friction rub.   No murmur heard. Pulmonary/Chest: Effort normal and breath sounds normal. No respiratory distress. She has no wheezes. She has no rales.  Abdominal: Soft. Bowel sounds are normal. She exhibits no distension. There is no tenderness. There is no rebound.  Musculoskeletal: Normal range of motion.       Arms: Neurological: She is alert and oriented to person, place, and time.  Skin: Skin is warm and dry. No rash noted.  Psychiatric: She has a normal mood and affect. Her behavior is normal.    ED Course  Procedures (including critical care time) Labs Review Labs Reviewed - No data to display  Imaging Review No results found.   EKG Interpretation None      MDM   Final diagnoses:  Laceration    CT with No acute changes.  Lac is self approximating.  IV team to flush and re-dress PIC.  No signs of trauma to pic line. Plan is  DC. Basic wound care.    Tanna Furry, MD 04/22/14 [phone]

## 2014-04-29 ENCOUNTER — Encounter (HOSPITAL_COMMUNITY): Payer: Self-pay | Admitting: Emergency Medicine

## 2014-04-29 ENCOUNTER — Emergency Department (HOSPITAL_COMMUNITY): Payer: Medicare HMO

## 2014-04-29 ENCOUNTER — Inpatient Hospital Stay (HOSPITAL_COMMUNITY)
Admission: EM | Admit: 2014-04-29 | Discharge: 2014-05-08 | DRG: 085 | Disposition: A | Discharge status: Skilled Nursing Facility | Payer: Medicare HMO | Attending: Internal Medicine | Admitting: Internal Medicine

## 2014-04-29 DIAGNOSIS — H3530 Unspecified macular degeneration: Secondary | ICD-10-CM | POA: Diagnosis present

## 2014-04-29 DIAGNOSIS — D72829 Elevated white blood cell count, unspecified: Secondary | ICD-10-CM | POA: Diagnosis present

## 2014-04-29 DIAGNOSIS — Z87891 Personal history of nicotine dependence: Secondary | ICD-10-CM | POA: Diagnosis not present

## 2014-04-29 DIAGNOSIS — Z7982 Long term (current) use of aspirin: Secondary | ICD-10-CM

## 2014-04-29 DIAGNOSIS — R509 Fever, unspecified: Secondary | ICD-10-CM | POA: Diagnosis present

## 2014-04-29 DIAGNOSIS — T8140XA Infection following a procedure, unspecified, initial encounter: Secondary | ICD-10-CM | POA: Diagnosis present

## 2014-04-29 DIAGNOSIS — Z66 Do not resuscitate: Secondary | ICD-10-CM | POA: Diagnosis present

## 2014-04-29 DIAGNOSIS — Z9181 History of falling: Secondary | ICD-10-CM

## 2014-04-29 DIAGNOSIS — Z7983 Long term (current) use of bisphosphonates: Secondary | ICD-10-CM

## 2014-04-29 DIAGNOSIS — R404 Transient alteration of awareness: Secondary | ICD-10-CM | POA: Diagnosis present

## 2014-04-29 DIAGNOSIS — R4182 Altered mental status, unspecified: Secondary | ICD-10-CM | POA: Diagnosis not present

## 2014-04-29 DIAGNOSIS — Z880 Allergy status to penicillin: Secondary | ICD-10-CM | POA: Diagnosis not present

## 2014-04-29 DIAGNOSIS — W19XXXA Unspecified fall, initial encounter: Secondary | ICD-10-CM | POA: Diagnosis present

## 2014-04-29 DIAGNOSIS — S065X9A Traumatic subdural hemorrhage with loss of consciousness of unspecified duration, initial encounter: Secondary | ICD-10-CM | POA: Diagnosis present

## 2014-04-29 DIAGNOSIS — Z8672 Personal history of thrombophlebitis: Secondary | ICD-10-CM

## 2014-04-29 DIAGNOSIS — A0472 Enterocolitis due to Clostridium difficile, not specified as recurrent: Secondary | ICD-10-CM | POA: Diagnosis present

## 2014-04-29 DIAGNOSIS — I10 Essential (primary) hypertension: Secondary | ICD-10-CM | POA: Diagnosis present

## 2014-04-29 DIAGNOSIS — F0390 Unspecified dementia without behavioral disturbance: Secondary | ICD-10-CM | POA: Diagnosis present

## 2014-04-29 DIAGNOSIS — E46 Unspecified protein-calorie malnutrition: Secondary | ICD-10-CM | POA: Diagnosis present

## 2014-04-29 DIAGNOSIS — S065XAA Traumatic subdural hemorrhage with loss of consciousness status unknown, initial encounter: Secondary | ICD-10-CM | POA: Diagnosis present

## 2014-04-29 DIAGNOSIS — M1990 Unspecified osteoarthritis, unspecified site: Secondary | ICD-10-CM | POA: Diagnosis present

## 2014-04-29 DIAGNOSIS — R4701 Aphasia: Secondary | ICD-10-CM | POA: Diagnosis present

## 2014-04-29 DIAGNOSIS — T8149XA Infection following a procedure, other surgical site, initial encounter: Secondary | ICD-10-CM

## 2014-04-29 DIAGNOSIS — R1311 Dysphagia, oral phase: Secondary | ICD-10-CM | POA: Diagnosis present

## 2014-04-29 DIAGNOSIS — R1313 Dysphagia, pharyngeal phase: Secondary | ICD-10-CM | POA: Diagnosis present

## 2014-04-29 DIAGNOSIS — G9340 Encephalopathy, unspecified: Secondary | ICD-10-CM | POA: Diagnosis present

## 2014-04-29 DIAGNOSIS — J690 Pneumonitis due to inhalation of food and vomit: Secondary | ICD-10-CM | POA: Diagnosis present

## 2014-04-29 DIAGNOSIS — Z8614 Personal history of Methicillin resistant Staphylococcus aureus infection: Secondary | ICD-10-CM | POA: Diagnosis not present

## 2014-04-29 DIAGNOSIS — Z888 Allergy status to other drugs, medicaments and biological substances status: Secondary | ICD-10-CM | POA: Diagnosis not present

## 2014-04-29 DIAGNOSIS — IMO0002 Reserved for concepts with insufficient information to code with codable children: Secondary | ICD-10-CM | POA: Diagnosis not present

## 2014-04-29 DIAGNOSIS — E039 Hypothyroidism, unspecified: Secondary | ICD-10-CM | POA: Diagnosis present

## 2014-04-29 DIAGNOSIS — M069 Rheumatoid arthritis, unspecified: Secondary | ICD-10-CM | POA: Diagnosis present

## 2014-04-29 DIAGNOSIS — R569 Unspecified convulsions: Secondary | ICD-10-CM | POA: Diagnosis present

## 2014-04-29 DIAGNOSIS — Z9889 Other specified postprocedural states: Secondary | ICD-10-CM

## 2014-04-29 DIAGNOSIS — Z515 Encounter for palliative care: Secondary | ICD-10-CM

## 2014-04-29 DIAGNOSIS — M48061 Spinal stenosis, lumbar region without neurogenic claudication: Secondary | ICD-10-CM | POA: Diagnosis present

## 2014-04-29 DIAGNOSIS — Z885 Allergy status to narcotic agent status: Secondary | ICD-10-CM | POA: Diagnosis not present

## 2014-04-29 LAB — COMPREHENSIVE METABOLIC PANEL
ALT: 27 U/L (ref 0–35)
AST: 64 U/L — ABNORMAL HIGH (ref 0–37)
Albumin: 2.7 g/dL — ABNORMAL LOW (ref 3.5–5.2)
Alkaline Phosphatase: 78 U/L (ref 39–117)
Anion gap: 11 (ref 5–15)
BUN: 14 mg/dL (ref 6–23)
CO2: 24 mEq/L (ref 19–32)
Calcium: 9.6 mg/dL (ref 8.4–10.5)
Chloride: 98 mEq/L (ref 96–112)
Creatinine, Ser: 0.6 mg/dL (ref 0.50–1.10)
GFR calc Af Amer: >90 mL/min (ref >90)
GFR calc non Af Amer: 82 mL/min — ABNORMAL LOW (ref >90)
Glucose, Bld: 120 mg/dL — ABNORMAL HIGH (ref 70–99)
Potassium: 3.9 mEq/L (ref 3.7–5.3)
Sodium: 133 mEq/L — ABNORMAL LOW (ref 137–147)
Total Bilirubin: 0.2 mg/dL — ABNORMAL LOW (ref 0.3–1.2)
Total Protein: 6.1 g/dL (ref 6.0–8.3)

## 2014-04-29 LAB — CBC WITH DIFFERENTIAL/PLATELET
Basophils Absolute: 0 10*3/uL (ref 0.0–0.1)
Basophils Relative: 0 % (ref 0–1)
Eosinophils Absolute: 0.1 10*3/uL (ref 0.0–0.7)
Eosinophils Relative: 1 % (ref 0–5)
HCT: 35.7 % — ABNORMAL LOW (ref 36.0–46.0)
Hemoglobin: 11.3 g/dL — ABNORMAL LOW (ref 12.0–15.0)
Lymphocytes Relative: 16 % (ref 12–46)
Lymphs Abs: 1.4 10*3/uL (ref 0.7–4.0)
MCH: 31.3 pg (ref 26.0–34.0)
MCHC: 31.7 g/dL (ref 30.0–36.0)
MCV: 98.9 fL (ref 78.0–100.0)
Monocytes Absolute: 1 10*3/uL (ref 0.1–1.0)
Monocytes Relative: 12 % (ref 3–12)
Neutro Abs: 6.1 10*3/uL (ref 1.7–7.7)
Neutrophils Relative %: 71 % (ref 43–77)
Platelets: 288 10*3/uL (ref 150–400)
RBC: 3.61 MIL/uL — ABNORMAL LOW (ref 3.87–5.11)
RDW: 13.9 % (ref 11.5–15.5)
WBC: 8.6 10*3/uL (ref 4.0–10.5)

## 2014-04-29 LAB — URINALYSIS, ROUTINE W REFLEX MICROSCOPIC
Bilirubin Urine: NEGATIVE
Glucose, UA: NEGATIVE mg/dL
Hgb urine dipstick: NEGATIVE
Ketones, ur: NEGATIVE mg/dL
Leukocytes, UA: NEGATIVE
Nitrite: NEGATIVE
Protein, ur: NEGATIVE mg/dL
Specific Gravity, Urine: 1.015 (ref 1.005–1.030)
Urobilinogen, UA: 0.2 mg/dL (ref 0.0–1.0)
pH: 7 (ref 5.0–8.0)

## 2014-04-29 LAB — I-STAT CG4 LACTIC ACID, ED: Lactic Acid, Venous: 1.45 mmol/L (ref 0.5–2.2)

## 2014-04-29 LAB — TSH: TSH: 1.48 u[IU]/mL (ref 0.350–4.500)

## 2014-04-29 MED ORDER — BISOPROLOL-HYDROCHLOROTHIAZIDE 5-6.25 MG PO TABS
1.0000 | ORAL_TABLET | Freq: Every day | ORAL | Status: DC
  Administered 2014-05-02 – 2014-05-04 (×3): 1 via ORAL
  Filled 2014-04-29 (×6): qty 1

## 2014-04-29 MED ORDER — FOLIC ACID 1 MG PO TABS
1.0000 mg | ORAL_TABLET | Freq: Every day | ORAL | Status: DC
  Administered 2014-04-30: 1 mg via ORAL
  Filled 2014-04-29 (×2): qty 1

## 2014-04-29 MED ORDER — LEVOTHYROXINE SODIUM 50 MCG PO TABS: 50.0000 ug | ORAL_TABLET | ORAL | Status: DC

## 2014-04-29 MED ORDER — PREDNISONE 5 MG PO TABS
5.0000 mg | ORAL_TABLET | Freq: Every day | ORAL | Status: DC
  Administered 2014-04-30 – 2014-05-02 (×2): 5 mg via ORAL
  Filled 2014-04-29 (×8): qty 1

## 2014-04-29 MED ORDER — LEVOTHYROXINE SODIUM 100 MCG PO TABS
100.0000 ug | ORAL_TABLET | ORAL | Status: DC
  Administered 2014-04-30: 100 ug via ORAL
  Filled 2014-04-29 (×3): qty 1

## 2014-04-29 MED ORDER — ACETAMINOPHEN 325 MG PO TABS: 650.0000 mg | ORAL_TABLET | Freq: Four times a day (QID) | ORAL | Status: DC | PRN

## 2014-04-29 MED ORDER — SODIUM CHLORIDE 0.9 % IV BOLUS (SEPSIS)
1000.0000 mL | Freq: Once | INTRAVENOUS | Status: AC
  Administered 2014-04-29: 1000 mL via INTRAVENOUS

## 2014-04-29 MED ORDER — SACCHAROMYCES BOULARDII 250 MG PO CAPS
250.0000 mg | ORAL_CAPSULE | Freq: Two times a day (BID) | ORAL | Status: DC
  Administered 2014-04-30 – 2014-05-08 (×14): 250 mg via ORAL
  Filled 2014-04-29 (×19): qty 1

## 2014-04-29 MED ORDER — METHOTREXATE 2.5 MG PO TABS
10.0000 mg | ORAL_TABLET | ORAL | Status: DC
  Administered 2014-04-30 – 2014-05-08 (×3): 10 mg via ORAL
  Filled 2014-04-29 (×4): qty 4

## 2014-04-29 MED ORDER — SODIUM CHLORIDE 0.9 % IV SOLN
INTRAVENOUS | Status: DC
  Administered 2014-04-30: via INTRAVENOUS

## 2014-04-29 MED ORDER — DILTIAZEM HCL ER COATED BEADS 240 MG PO CP24
240.0000 mg | ORAL_CAPSULE | Freq: Every day | ORAL | Status: DC
  Administered 2014-05-02 – 2014-05-04 (×3): 240 mg via ORAL
  Filled 2014-04-29 (×5): qty 1

## 2014-04-29 MED ORDER — DEXTROSE 5 % IV SOLN
2.0000 g | INTRAVENOUS | Status: DC
  Administered 2014-04-30 – 2014-05-01 (×3): 2 g via INTRAVENOUS
  Filled 2014-04-29 (×5): qty 2

## 2014-04-29 MED ORDER — ACETAMINOPHEN 650 MG RE SUPP
650.0000 mg | Freq: Four times a day (QID) | RECTAL | Status: DC | PRN
  Administered 2014-05-03 – 2014-05-04 (×2): 650 mg via RECTAL
  Filled 2014-04-29 (×2): qty 1

## 2014-04-29 NOTE — H&P (Signed)
Marissa Parsons is an 80 y.o. female.   Chief Complaint: confusion and weakness HPI: Marissa Parsons is a pleasant female.  She had L spine surgery 8 wks ago or so.  She did well but then a week later she was found to have an infection.  She required further procedure and she has been on IV abx via PICC line since then.  Apparently she had a fall or two a few days ago.  She was seen in ER and CT done about 10 days ago.  She was sent home.   Today she had further confusion and weakness and she was brought back to the ER .  She is found to have a subdural hematoma and she will require admission.  Neurosurgery has been consulted and felt that general medical admission was more appropriate.  She had a temp of 99 today but no subjective temps.  She has been eating and drinking okay. Last bm was today. No blood noted above or below.  No fall today. She is having some word finding problems today and this is not normal for her.  Past Medical History  Diagnosis Date  . Hypertension   . Rheumatoid arteritis   . Phlebitis   . Deep vein thrombophlebitis of leg   . Fractured pelvis     fall 2011  . Thyroid disease     hypothyroidism  . Humerus fracture   . Thrombocytopenia   . Leukocytosis   . Hypothyroidism   . Phlebitis      Recurrent phlebitis  . Hematoma      Right superior and inferior pubic rami fractures with hematoma adjacent to the right ramus fracture  . Urinary tract infection   . Humerus fracture      Right proximal humerus fracture  . Osteoarthritis     for which she takes chronic prednisone  . Macular degeneration   . Chronic anticoagulation   . Fracture of multiple pubic rami 01/16/2013  . Dementia     MILD    Past Surgical History  Procedure Laterality Date  . Left hip open reduction    . Cholecystectomy    . Abdominal hysterectomy  s  . Sinus surgery with instatrak    . Lumbar laminectomy/decompression microdiscectomy N/A 03/06/2014    Procedure: LUMBAR LAMINECTOMY/DECOMPRESSION  LUMBAR THREE, FOUR, AND FIVE ;  Surgeon: Eustace Moore, MD;  Location: [facility];  Service: Neurosurgery;  Laterality: N/A;  LUMBAR LAMINECTOMY/DECOMPRESSION LUMBAR THREE, FOUR, AND FIVE   . Lumbar wound debridement N/A 03/21/2014    Procedure: Incision and drainage of lumbar wound;  Surgeon: Eustace Moore, MD;  Location: [facility];  Service: Neurosurgery;  Laterality: N/A;    Family History  Problem Relation Age of Onset  . Abnormal EKG Neg Hx   . Abnormal newborn screen Neg Hx   . Achalasia Neg Hx   . Achondroplasia Neg Hx   . Acne Neg Hx   . Acromegaly Neg Hx   . Actinic keratosis Neg Hx   . Acute lymphoblastic leukemia Neg Hx   . Addison's disease Neg Hx   . Adrenal disorder Neg Hx   . Albinism Neg Hx   . Alcohol abuse Neg Hx   . Alkaptonuria Neg Hx   . Allergic rhinitis Neg Hx   . Allergies Neg Hx   . Allergy (severe) Neg Hx   . Alopecia Neg Hx   . Alpha-1 antitrypsin deficiency Neg Hx   . Alport syndrome Neg Hx   .  ALS Neg Hx   . Alzheimer's disease Neg Hx   . Ambiguous genitalia Neg Hx   . Amblyopia Neg Hx   . Amenorrhea Neg Hx   . Anal fissures Neg Hx   . Anemia Neg Hx   . Anesthesia problems Neg Hx   . Aneurysm Neg Hx   . Angelman syndrome Neg Hx   . Angina Neg Hx   . Angioedema Neg Hx   . Ankylosing spondylitis Neg Hx   . Anorectal malformation Neg Hx   . Anorexia nervosa Neg Hx   . Anti-cardiolipin syndrome Neg Hx   . Antithrombin III deficiency Neg Hx   . Anuerysm Neg Hx   . Anxiety disorder Neg Hx   . Aortic aneurysm Neg Hx   . Aortic dissection Neg Hx   . Aortic stenosis Neg Hx   . Aphthous stomatitis Neg Hx   . Aplastic anemia Neg Hx   . Appendicitis Neg Hx   . Apraxia Neg Hx   . Arnold-Chiari malformation Neg Hx   . Arrhythmia Neg Hx   . Arthritis Neg Hx   . Asperger's syndrome Neg Hx   . Asthma Neg Hx   . Ataxia Neg Hx   . Ataxia telangiectasia Neg Hx   . Atopy Neg Hx   . Atrial fibrillation Neg Hx   . Atrophic kidney Neg Hx   .  Auditory processing disorder Neg Hx   . Autism Neg Hx   . Autism spectrum disorder Neg Hx   . Autoimmune disease Neg Hx   . AVM Neg Hx   . Baker's cyst Neg Hx   . Barrett's esophagus Neg Hx   . Bartter's syndrome Neg Hx   . Basal cell carcinoma Neg Hx   . Behavior problems Neg Hx   . Bell's palsy Neg Hx   . Benign prostatic hyperplasia Neg Hx   . Bipolar disorder Neg Hx   . Birth defects Neg Hx   . Birth marks Neg Hx   . Blindness Neg Hx   . Bone cancer Neg Hx   . BOR syndrome Neg Hx   . Bow legs Neg Hx   . Bradycardia Neg Hx   . Brain cancer Neg Hx   . BRCA 1/2 Neg Hx   . Breast cancer Neg Hx   . Broken bones Neg Hx   . Bronchiolitis Neg Hx   . Bronchopulmonary dysplasia Neg Hx   . Bruton's disease Neg Hx   . Bulemia Neg Hx   . Bullous pemphigoid Neg Hx   . Bunion Neg Hx   . Bursitis Neg Hx   . Caf-au-lait spots Neg Hx   . Calcium disorder Neg Hx   . Canavan disease Neg Hx   . Cancer Neg Hx   . Cardiomyopathy Neg Hx   . Carpal tunnel syndrome Neg Hx   . Cataracts Neg Hx   . Celiac disease Neg Hx   . Cerebral aneurysm Neg Hx   . Cerebral palsy Neg Hx   . Cervical cancer Neg Hx   . Cervical polyp Neg Hx   . Cervicitis Neg Hx   . Chalasia Neg Hx   . Chalazion Neg Hx   . Charcot-Marie-Tooth disease Neg Hx   . Chediak-Higashi syndrome Neg Hx   . Chiari malformation Neg Hx   . Childhood respiratory disease Neg Hx   . Choanal atresia Neg Hx   . Cholecystitis Neg Hx   . Cholelithiasis Neg Hx   .  Cholesteatoma Neg Hx   . Chondromalacia Neg Hx   . Chorea Neg Hx   . Chromosomal disorder Neg Hx   . Chronic bronchitis Neg Hx   . Chronic fatigue Neg Hx   . Chronic granulomatous disease Neg Hx   . Chronic infections Neg Hx   . Cirrhosis Neg Hx   . Cleft lip Neg Hx   . Cleft palate Neg Hx   . Clotting disorder Neg Hx   . Club foot Neg Hx   . Coarctation of the aorta Neg Hx   . Colitis Neg Hx   . Collagen disease Neg Hx   . Colon cancer Neg Hx   . Colon polyps  Neg Hx   . Colonic polyp Neg Hx   . Color blindness Neg Hx   . Conduct disorder Neg Hx   . Conductive hearing loss Neg Hx   . Congenital adrenal hyperplasia Neg Hx   . Congenital heart disease Neg Hx   . Conjunctivitis Neg Hx   . Consanguinity Neg Hx   . Constipation Neg Hx   . COPD Neg Hx   . Corneal abrasion Neg Hx   . Corneal ulcer Neg Hx   . Coronary aneurysm Neg Hx   . Coronary artery disease Neg Hx   . Cowden syndrome Neg Hx   . Craniosynostosis Neg Hx   . Cri-du-chat syndrome Neg Hx   . Crohn's disease Neg Hx   . Cushing syndrome Neg Hx   . Cystic fibrosis Neg Hx   . Cystic kidney disease Neg Hx   . Cystinosis Neg Hx   . Decreased libido Neg Hx   . Deep vein thrombosis Neg Hx   . Delayed menopause Neg Hx   . Delayed puberty Neg Hx   . Dementia Neg Hx   . Dental caries Neg Hx   . Depression Neg Hx   . Dermatomyositis Neg Hx   . DES usage Neg Hx   . Developmental delay Neg Hx   . Diabetes Neg Hx   . Diabetes insipidus Neg Hx   . Diabetes type I Neg Hx   . Diabetes type II Neg Hx   . Diabetic kidney disease Neg Hx   . DiGeorge syndrome Neg Hx   . Dilated cardiomyopathy Neg Hx   . Dislocations Neg Hx   . Diverticulitis Neg Hx   . Diverticulosis Neg Hx   . Down syndrome Neg Hx   . Drug abuse Neg Hx   . Dupuytren's contracture Neg Hx   . Dwarfism Neg Hx   . Dysfunctional uterine bleeding Neg Hx   . Dysmenorrhea Neg Hx   . Dyspareunia Neg Hx   . Dysphagia Neg Hx   . Dysrhythmia Neg Hx   . Dystonia Neg Hx   . Early death Neg Hx   . Early menopause Neg Hx   . Early puberty Neg Hx   . Eating disorder Neg Hx   . Eclampsia Neg Hx   . Ectodermal dysplasia Neg Hx   . Eczema Neg Hx   . Edema Neg Hx   . Edward's syndrome Neg Hx   . Ehlers-Danlos syndrome Neg Hx   . Emotional abuse Neg Hx   . Emphysema Neg Hx   . Encopresis Neg Hx   . Endocrine tumor Neg Hx   . Endocrinopathy Neg Hx   . Endometrial cancer Neg Hx   . Endometriosis Neg Hx   . Enuresis Neg Hx    . Eosinophilic  granuloma Neg Hx   . Epididymitis Neg Hx   . Erectile dysfunction Neg Hx   . Erythema nodosum Neg Hx   . Esophageal cancer Neg Hx   . Esophageal varices Neg Hx   . Esophagitis Neg Hx   . Exostosis Neg Hx   . Fabry's disease Neg Hx   . Factor IX deficiency Neg Hx   . Factor V Leiden deficiency Neg Hx   . Factor VIII deficiency Neg Hx   . Failure to thrive Neg Hx   . Fainting Neg Hx   . Familial dysautonomia Neg Hx   . Familial nephritis Neg Hx   . Familial polyposis Neg Hx   . Fanconi anemia Neg Hx   . Febrile seizures Neg Hx   . Fibrocystic breast disease Neg Hx   . Fibroids Neg Hx   . Fibromyalgia Neg Hx   . Food intolerance Neg Hx   . Fragile X syndrome Neg Hx   . Friedreich's ataxia Neg Hx   . Frontotemporal dementia Neg Hx   . Fuch's dystrophy Neg Hx   . Gait disorder Neg Hx   . Galactosemia Neg Hx   . Gallbladder disease Neg Hx   . Gaucher's disease Neg Hx   . Genodermatoses Neg Hx   . GER disease Neg Hx   . Gestational diabetes Neg Hx   . GI problems Neg Hx   . Glaucoma Neg Hx   . Glomerulonephritis Neg Hx   . Glucose-6-phos deficiency Neg Hx   . Glycogen storage disease Neg Hx   . Goiter Neg Hx   . Gonadal disorder Neg Hx   . Gout Neg Hx   . Graves' disease Neg Hx   . Growth hormone deficiency Neg Hx   . GU problems Neg Hx   . Hammer toes Neg Hx   . Hartnup's disease Neg Hx   . Hashimoto's thyroiditis Neg Hx   . Healthy Neg Hx   . Hearing loss Neg Hx   . Heart block Neg Hx   . Heart defect Neg Hx   . Heart disease Neg Hx   . Heart failure Neg Hx   . Heart murmur Neg Hx   . Hemangiomas Neg Hx   . Hematuria Neg Hx   . Hemochromatosis Neg Hx   . Hemolytic uremic syndrome Neg Hx   . Hemophilia Neg Hx   . Henoch-Schonlein purpura Neg Hx   . Hepatitis Neg Hx   . Hepatomegaly Neg Hx   . Hereditary spherocytosis Neg Hx   . Hernia Neg Hx   . Hiatal hernia Neg Hx   . High arches Neg Hx   . Hip dysplasia Neg Hx   . Hip fracture Neg Hx    . Hirschsprung's disease Neg Hx   . Hirsutism Neg Hx   . Histiocytosis X Neg Hx   . HIV Neg Hx   . HLA-B27 positive Neg Hx   . Hodgkin's lymphoma Neg Hx   . Homocystinuria Neg Hx   . Hunter's disease Neg Hx   . Huntington's disease Neg Hx   . Hydrocele Neg Hx   . Hydrocephalus Neg Hx   . Hygroma Neg Hx   . Hypercalcemia Neg Hx   . Hypereosinophilic syndrome Neg Hx   . Hyperinsulinemia Neg Hx   . Hyperkalemia Neg Hx   . Hyperlipidemia Neg Hx   . Hypermobility Neg Hx   . Hypernasality Neg Hx   . Hyperopia Neg Hx   .  Hyperparathyroidism Neg Hx   . Hypersomnolence Neg Hx   . Hypertension Neg Hx   . Hyperthyroidism Neg Hx   . Hypertrophic cardiomyopathy Neg Hx   . Hypokalemia Neg Hx   . Hypoparathyroidism Neg Hx   . Hypoplastic kidney Neg Hx   . Hypotension Neg Hx   . Hypothyroidism Neg Hx   . Idiopathic pulmonary fibrosis Neg Hx   . Idiopathic torsion dystonia Neg Hx   . IgA nephropathy Neg Hx   . Immunodeficiency Neg Hx   . Imperforate anus Neg Hx   . Impotence Neg Hx   . Impulse control disorder Neg Hx   . Incompetent cervix Neg Hx   . Infertility Neg Hx   . Inflammatory bowel disease Neg Hx   . Inguinal hernia Neg Hx   . Insomnia Neg Hx   . Insulin resistance Neg Hx   . Interstitial cystitis Neg Hx   . Intestinal malrotation Neg Hx   . Intestinal polyp Neg Hx   . Intracerebral hemorrhage Neg Hx   . Iron deficiency Neg Hx   . Irregular heart beat Neg Hx   . Irritable bowel syndrome Neg Hx   . Jaundice Neg Hx   . Job's syndrome Neg Hx   . Joint hypermobility Neg Hx   . Juvenile idiopathic arthritis Neg Hx   . Kartagener's syndrome Neg Hx   . Kawasaki disease Neg Hx   . Keloids Neg Hx   . Kennedy's disease Neg Hx   . Keratoconus Neg Hx   . Kidney cancer Neg Hx   . Kidney disease Neg Hx   . Kidney failure Neg Hx   . Kidney nephrosis Neg Hx   . Klinefelter's syndrome Neg Hx   . Krabbe disease Neg Hx   . Kyphosis Neg Hx   . Labyrinthitis Neg Hx   .  Lactose intolerance Neg Hx   . Language disorder Neg Hx   . Lead poisoning Neg Hx   . Learning disabilities Neg Hx   . Legg-Calve-Perthes disease Neg Hx   . Lesch-Nyhan syndrome Neg Hx   . Leukemia Neg Hx   . Lichen planus Neg Hx   . Li-Fraumeni syndrome Neg Hx   . Liver cancer Neg Hx   . Liver disease Neg Hx   . Long QT syndrome Neg Hx   . Lumbar disc disease Neg Hx   . Lung cancer Neg Hx   . Lung disease Neg Hx   . Lupus Neg Hx   . Lymphoma Neg Hx   . Macrocephaly Neg Hx   . Macrosomia Neg Hx   . Macular degeneration Neg Hx   . Malignant hypertension Neg Hx   . Malignant hyperthermia Neg Hx   . Maple syrup urine disease Neg Hx   . Marfan syndrome Neg Hx   . Mastocytosis Neg Hx   . Melanoma Neg Hx   . Memory loss Neg Hx   . Meniere's disease Neg Hx   . Menstrual problems Neg Hx   . Mental illness Neg Hx   . Mental retardation Neg Hx   . Metabolic syndrome Neg Hx   . Microcephaly Neg Hx   . Migraines Neg Hx   . Milk intolerance Neg Hx   . Miscarriages / Stillbirths Neg Hx   . Mitochondrial disorder Neg Hx   . Mitral valve prolapse Neg Hx   . Motor neuron disease Neg Hx   . Movement disorder Neg Hx   . Moyamoya disease  Neg Hx   . Multiple births Neg Hx   . Multiple endocrine neoplasia Neg Hx   . Multiple fractures Neg Hx   . Multiple myeloma Neg Hx   . Multiple sclerosis Neg Hx   . Muscle cancer Neg Hx   . Muscular dystrophy Neg Hx   . Myasthenia gravis Neg Hx   . Myelodysplastic syndrome Neg Hx   . Myocarditis Neg Hx   . Myoclonus Neg Hx   . Myopathy Neg Hx   . Nail disease Neg Hx   . Narcolepsy Neg Hx   . Nephritis Neg Hx   . Nephrolithiasis Neg Hx   . Nephrotic syndrome Neg Hx   . Neural tube defect Neg Hx   . Neurodegenerative disease Neg Hx   . Neurofibromatosis Neg Hx   . Neuromuscular disorder Neg Hx   . Neuropathy Neg Hx   . Neutropenia Neg Hx   . Nevi Neg Hx   . Niemann-Pick disease Neg Hx   . Night blindness Neg Hx   . Nocturnal enuresis Neg  Hx   . Nystagmus Neg Hx   . Obesity Neg Hx   . OCD Neg Hx   . ODD Neg Hx   . Orchitis Neg Hx   . Osler-Weber-Rendu syndrome Neg Hx   . Osteoarthritis Neg Hx   . Osteochondroma Neg Hx   . Osteogenesis imperfecta Neg Hx   . Osteopenia Neg Hx   . Osteoporosis Neg Hx   . Osteosarcoma Neg Hx   . Osteosclerosis Neg Hx   . Other Neg Hx   . Otitis media Neg Hx   . Ovarian cancer Neg Hx   . Ovarian cysts Neg Hx   . Oxalosis Neg Hx   . Paget's disease of bone Neg Hx   . Pancreatic cancer Neg Hx   . Pancreatitis Neg Hx   . Panhypopituitarism Neg Hx   . Panic disorder Neg Hx   . Paranoid behavior Neg Hx   . Parasomnia Neg Hx   . Parkinsonism Neg Hx   . Patau's syndrome Neg Hx   . Pathological gambling Neg Hx   . PDD Neg Hx   . Pectus carinatum Neg Hx   . Pectus excavatum Neg Hx   . Pelvic inflammatory disease Neg Hx   . Pemphigus vulgaris Neg Hx   . Penile cancer Neg Hx   . Periodic limb movement Neg Hx   . Peripheral vascular disease Neg Hx   . Pernicious anemia Neg Hx   . Personality disorder Neg Hx   . Pes cavus Neg Hx   . Physical abuse Neg Hx   . Polo Riley sequence Neg Hx   . PKU Neg Hx   . Plantar fasciitis Neg Hx   . Pleurisy Neg Hx   . Pneumonia Neg Hx   . Polychondritis Neg Hx   . Polycystic kidney disease Neg Hx   . Polycystic ovary syndrome Neg Hx   . Polycythemia Neg Hx   . Polymyalgia rheumatica Neg Hx   . Polymyositis Neg Hx   . Pompe disease Neg Hx   . Positive PPD/TB Exposure Neg Hx   . Posterior urethral valves Neg Hx   . Post-traumatic stress disorder Neg Hx   . Prader-Willi syndrome Neg Hx   . Premature birth Neg Hx   . Premature ovarian failure Neg Hx   . Preterm labor Neg Hx   . Prolactinoma Neg Hx   . Prostate cancer Neg Hx   .  Prostatitis Neg Hx   . Protein C deficiency Neg Hx   . Protein S deficiency Neg Hx   . Proteinuria Neg Hx   . Prune belly syndrome Neg Hx   . Pruritis Neg Hx   . Pseudochol deficiency Neg Hx   . Pseudotumor  cerebri Neg Hx   . Psoriasis Neg Hx   . Psychosis Neg Hx   . Ptosis Neg Hx   . Pulmonary embolism Neg Hx   . Pulmonary fibrosis Neg Hx   . Pyelonephritis Neg Hx   . Pyloric stenosis Neg Hx   . Pyruvate dehydrogenase deficiency Neg Hx   . Rashes / Skin problems Neg Hx   . Raynaud syndrome Neg Hx   . Rectal cancer Neg Hx   . Recurrent abdominal pain Neg Hx   . Reiter's syndrome Neg Hx   . Renal tubular acidosis Neg Hx   . Restless legs syndrome Neg Hx   . Retinal degeneration Neg Hx   . Retinal detachment Neg Hx   . Retinitis pigmentosa Neg Hx   . Retinoblastoma Neg Hx   . Retinopathy of prematurity Neg Hx   . Reye's syndrome Neg Hx   . Rheum arthritis Neg Hx   . Rheumatic fever Neg Hx   . Rheumatologic disease Neg Hx   . Rickets Neg Hx   . Rosacea Neg Hx   . Sacroiliitis Neg Hx   . Sarcoidosis Neg Hx   . Schizophrenia Neg Hx   . Scleritis Neg Hx   . Scleroderma Neg Hx   . Scoliosis Neg Hx   . Seasonal affective disorder Neg Hx   . Seizures Neg Hx   . Selective mutism Neg Hx   . Sensorineural hearing loss Neg Hx   . Severe combined immunodeficiency Neg Hx   . Severe sprains Neg Hx   . Sexual abuse Neg Hx   . Short stature Neg Hx   . Sick sinus syndrome Neg Hx   . Sickle cell anemia Neg Hx   . Sickle cell trait Neg Hx   . SIDS Neg Hx   . Single kidney Neg Hx   . Sinusitis Neg Hx   . Sjogren's syndrome Neg Hx   . Skeletal dysplasia Neg Hx   . Skin cancer Neg Hx   . Skin telangiectasia Neg Hx   . Sleep apnea Neg Hx   . Sleep disorder Neg Hx   . Sleep walking Neg Hx   . Snoring Neg Hx   . Social phobia Neg Hx   . Speech disorder Neg Hx   . Spina bifida Neg Hx   . Spinal muscular atrophy Neg Hx   . Splenomegaly Neg Hx   . Spondyloarthropathy Neg Hx   . Spondylolisthesis Neg Hx   . Spondylolysis Neg Hx   . Squamous cell carcinoma Neg Hx   . Stevens-Johnson syndrome Neg Hx   . Stickler syndrome Neg Hx   . Stomach cancer Neg Hx   . Strabismus Neg Hx   .  Stroke Neg Hx   . Stuttering Neg Hx   . Subarachnoid hemorrhage Neg Hx   . Sudden death Neg Hx   . Suicidality Neg Hx   . Supraventricular tachycardia Neg Hx   . Swallowing difficulties Neg Hx   . Tall stature Neg Hx   . Tay-Sachs disease Neg Hx   . Testicular cancer Neg Hx   . Thalassemia Neg Hx   . Throat cancer Neg Hx   .  Thrombocytopenia Neg Hx   . Thrombophilia Neg Hx   . Thrombophlebitis Neg Hx   . Thrombosis Neg Hx   . Thymic aplasia Neg Hx   . Thyroid cancer Neg Hx   . Thyroid disease Neg Hx   . Thyroid nodules Neg Hx   . Tics Neg Hx   . Tongue cancer Neg Hx   . Torticollis Neg Hx   . Tourette syndrome Neg Hx   . Tracheal cancer Neg Hx   . Tracheoesophageal fistual Neg Hx   . Tracheomalacia Neg Hx   . Transient ischemic attack Neg Hx   . Treacher Collins syndrome Neg Hx   . Tremor Neg Hx   . Tuberculosis Neg Hx   . Tuberous sclerosis Neg Hx   . Turner syndrome Neg Hx   . Ulcerative colitis Neg Hx   . Ulcers Neg Hx   . Undescended testes Neg Hx   . Unexplained death Neg Hx   . Urinary tract infection Neg Hx   . Urolithiasis Neg Hx   . Urticaria Neg Hx   . Uterine cancer Neg Hx   . Uveitis Neg Hx   . Vaginal cancer Neg Hx   . Valvular heart disease Neg Hx   . Vasculitis Neg Hx   . Velocardiofacial syndrome Neg Hx   . Venous thrombosis Neg Hx   . Verruca vulgaris  Neg Hx   . Vesicoureteral reflux Neg Hx   . Vision loss Neg Hx   . Vitamin D deficiency Neg Hx   . Vitiligo Neg Hx   . Voice disorder Neg Hx   . Von Gierke's disease Neg Hx   . Von Hippel-Lindau syndrome Neg Hx   . Von Willebrand disease Neg Hx   . Werdnig Hoffman Disease Neg Hx   . Williams syndrome Neg Hx   . Wilm's tumor Neg Hx   . Wilson's disease Neg Hx   . Wiskott-Aldrich syndrome Neg Hx   . Yves Dill Parkinson White syndrome Neg Hx   . Xeroderma pigmentosa Neg Hx   . Heart attack Mother   . Heart attack Father    Social History:  reports that she has quit smoking. She has never used  smokeless tobacco. She reports that she does not drink alcohol or use illicit drugs.  Allergies:  Allergies  Allergen Reactions  . Morphine And Related     nausea  . Tramadol Other (See Comments)    unknown  . Penicillins Rash     Home meds: the patient does not have it on her. i will refer to pharmacist med rec list.  Results for orders placed during the hospital encounter of 04/29/14 (from the past 48 hour(s))  CBC WITH DIFFERENTIAL     Status: Abnormal   Collection Time    04/29/14  5:31 PM      Result Value Ref Range   WBC 8.6  4.0 - 10.5 K/uL   RBC 3.61 (*) 3.87 - 5.11 MIL/uL   Hemoglobin 11.3 (*) 12.0 - 15.0 g/dL   HCT 35.7 (*) 36.0 - 46.0 %   MCV 98.9  78.0 - 100.0 fL   MCH 31.3  26.0 - 34.0 pg   MCHC 31.7  30.0 - 36.0 g/dL   RDW 13.9  11.5 - 15.5 %   Platelets 288  150 - 400 K/uL   Neutrophils Relative % 71  43 - 77 %   Neutro Abs 6.1  1.7 - 7.7 K/uL   Lymphocytes Relative 16  12 - 46 %   Lymphs Abs 1.4  0.7 - 4.0 K/uL   Monocytes Relative 12  3 - 12 %   Monocytes Absolute 1.0  0.1 - 1.0 K/uL   Eosinophils Relative 1  0 - 5 %   Eosinophils Absolute 0.1  0.0 - 0.7 K/uL   Basophils Relative 0  0 - 1 %   Basophils Absolute 0.0  0.0 - 0.1 K/uL  COMPREHENSIVE METABOLIC PANEL     Status: Abnormal   Collection Time    04/29/14  5:31 PM      Result Value Ref Range   Sodium 133 (*) 137 - 147 mEq/L   Potassium 3.9  3.7 - 5.3 mEq/L   Chloride 98  96 - 112 mEq/L   CO2 24  19 - 32 mEq/L   Glucose, Bld 120 (*) 70 - 99 mg/dL   BUN 14  6 - 23 mg/dL   Creatinine, Ser 0.60  0.50 - 1.10 mg/dL   Calcium 9.6  8.4 - 10.5 mg/dL   Total Protein 6.1  6.0 - 8.3 g/dL   Albumin 2.7 (*) 3.5 - 5.2 g/dL   AST 64 (*) 0 - 37 U/L   ALT 27  0 - 35 U/L   Alkaline Phosphatase 78  39 - 117 U/L   Total Bilirubin 0.2 (*) 0.3 - 1.2 mg/dL   GFR calc non Af Amer 82 (*) >90 mL/min   GFR calc Af Amer >90  >90 mL/min   Comment: (NOTE)     The eGFR has been calculated using the CKD EPI equation.      This calculation has not been validated in all clinical situations.     eGFR's persistently <90 mL/min signify possible Chronic Kidney     Disease.   Anion gap 11  5 - 15  TSH     Status: None   Collection Time    04/29/14  5:31 PM      Result Value Ref Range   TSH 1.480  0.350 - 4.500 uIU/mL  I-STAT CG4 LACTIC ACID, ED     Status: None   Collection Time    04/29/14  5:45 PM      Result Value Ref Range   Lactic Acid, Venous 1.45  0.5 - 2.2 mmol/L  URINALYSIS, ROUTINE W REFLEX MICROSCOPIC     Status: None   Collection Time    04/29/14  5:50 PM      Result Value Ref Range   Color, Urine YELLOW  YELLOW   APPearance CLEAR  CLEAR   Specific Gravity, Urine 1.015  1.005 - 1.030   pH 7.0  5.0 - 8.0   Glucose, UA NEGATIVE  NEGATIVE mg/dL   Hgb urine dipstick NEGATIVE  NEGATIVE   Bilirubin Urine NEGATIVE  NEGATIVE   Ketones, ur NEGATIVE  NEGATIVE mg/dL   Protein, ur NEGATIVE  NEGATIVE mg/dL   Urobilinogen, UA 0.2  0.0 - 1.0 mg/dL   Nitrite NEGATIVE  NEGATIVE   Leukocytes, UA NEGATIVE  NEGATIVE   Comment: MICROSCOPIC NOT DONE ON URINES WITH NEGATIVE PROTEIN, BLOOD, LEUKOCYTES, NITRITE, OR GLUCOSE <1000 mg/dL.   Ct Head Wo Contrast  04/29/2014   CLINICAL DATA:  Altered mental status, recent urinary tract infection.  EXAM: CT HEAD WITHOUT CONTRAST  TECHNIQUE: Contiguous axial images were obtained from the base of the skull through the vertex without intravenous contrast.  COMPARISON:  CT head April 18, 2014  FINDINGS: Degenerating left holohemispheric subdural hematoma previously up  to 8.4 mm, now 8 mm, without Re bleed. 3 mm left-to-right midline shift, similar.  The ventricles and sulci are overall normal for age. No intraparenchymal hemorrhage, mass effect nor midline shift. Confluent supratentorial white matter hypodensities again noted. No acute large vascular territory infarcts. Remote left cerebellar infarcts.  Basal cisterns are patent. Moderate calcific atherosclerosis of the carotid  siphons.  No skull fracture. The included ocular globes and orbital contents are non-suspicious. Status post bilateral ocular lens implants. Partially imaged left maxillary sinus bony wall thickening mucosal thickening, consistent with chronic sinusitis. No paranasal sinus air-fluid levels. Mastoid air cells are well aerated.  IMPRESSION: Degenerating left holohemispheric contracting subdural hematoma, 3 mm residual left-to-right midline shift without rebleed.  Involutional changes. Severe white matter changes suggest chronic small vessel ischemic disease with remote is left cerebellar infarct.  Findings discussed with and reconfirmed by CHRISTOPHER LAWYER on8/3/2015at6:15 pm.   Electronically Signed   By: Elon Alas   On: 04/29/2014 18:18   Dg Chest Port 1 View  04/29/2014   CLINICAL DATA:  Altered mental status  EXAM: PORTABLE CHEST - 1 VIEW  COMPARISON:  03/24/2014  FINDINGS: Right arm PICC tip in the mid SVC.  Improved aeration in the lung bases since the prior study. Small right effusion and minimal right lower lobe atelectasis have improved. Negative for heart failure.  IMPRESSION: Improved aeration in the lung bases. Small right effusion and mild right lower lobe atelectasis persist.   Electronically Signed   By: Franchot Gallo [name]   On: 04/29/2014 19:01    ROS:as per hpi  Blood pressure 130/51, pulse 71, temperature 99.4 F (37.4 C), temperature source Rectal, resp. rate 20, height _0  (1.702 m), weight 58.968 kg (130 lb), SpO2 98.00%.  age approp. female.  alert and oriented to person, place and not year  21 something. She is semi supine. NAD.  No jvd, no pallor.  She has grossly NL strength in UE and LE bilaterally. Lungs are cta bilat. No w/r/r. Heart is rrr no m/r/g. abd is soft , nt, nd , no mass or hsm No edema.  Assessment/Plan 80 yo female completing a course of antibiotics for a lumbar wound infection (to finish this week) now presenting with SDH.  We will admit to Neuro  unit.  We will obtain mri of brain. Further direction to come from Neurosurgical team.  I will ask for PT/OT to evaluate the patient.  I could foresee needing some more rehab time.  She has been to [facility] for rehab twice in the past and has done well there.  She is a full code status.  Jerlyn Ly, MD 04/29/2014, 8:36 PM

## 2014-04-29 NOTE — ED Notes (Signed)
Attempted to call report, nurse unavailable, advised will call back shortly.

## 2014-04-29 NOTE — ED Notes (Signed)
Patient transported by [facility] from home for complaint of diminishing mental status since Thursday.  EMS reports negative stroke screen, history of UTI and recent back surgery with staff infection.  Patient has a PICC line in the right upper arm upon arrival.

## 2014-04-29 NOTE — Consult Note (Signed)
Reason for Consult:Subdural hematoma left, aphasic Referring Physician: rancour  Marissa Parsons is an 80 y.o. female.  HPI: whom took a fall approximately one week ago on 7/23. She had a laceration on the forehead, which was repaired, and a Head CT which though read as normal revealed a small subdural hematoma on the left side with minimal mass effect. She returns today to the ED because her caregivers( neighbor, and Brother)both noticed she was slower to respond, that her memory was not normal, that she was not able to remember names of people she knew well. Upon arrival to the ED a head CT was performed and it showed the subdural, which was in fact smaller. There were no new lesions or abnormalities. Her head CT shows chronic changes consistent with small vessel disease, no new infarcts. She is well known to Neurosurgery due to lumbar decompression on 6/10, and an I&D on 6/25. [name] has a picc line for long term antibiotics. Her brother also stated that she just will not move her legs normally since the end of last week. She does not complain of pain, nor weakness.   Past Medical History  Diagnosis Date  . Hypertension   . Rheumatoid arteritis   . Phlebitis   . Deep vein thrombophlebitis of leg   . Fractured pelvis     fall 2011  . Thyroid disease     hypothyroidism  . Humerus fracture   . Thrombocytopenia   . Leukocytosis   . Hypothyroidism   . Phlebitis      Recurrent phlebitis  . Hematoma      Right superior and inferior pubic rami fractures with hematoma adjacent to the right ramus fracture  . Urinary tract infection   . Humerus fracture      Right proximal humerus fracture  . Osteoarthritis     for which she takes chronic prednisone  . Macular degeneration   . Chronic anticoagulation   . Fracture of multiple pubic rami 01/16/2013  . Dementia     MILD    Past Surgical History  Procedure Laterality Date  . Left hip open reduction    . Cholecystectomy    . Abdominal  hysterectomy  s  . Sinus surgery with instatrak    . Lumbar laminectomy/decompression microdiscectomy N/A 03/06/2014    Procedure: LUMBAR LAMINECTOMY/DECOMPRESSION LUMBAR THREE, FOUR, AND FIVE ;  Surgeon: Eustace Moore, MD;  Location: [facility];  Service: Neurosurgery;  Laterality: N/A;  LUMBAR LAMINECTOMY/DECOMPRESSION LUMBAR THREE, FOUR, AND FIVE   . Lumbar wound debridement N/A 03/21/2014    Procedure: Incision and drainage of lumbar wound;  Surgeon: Eustace Moore, MD;  Location: [facility];  Service: Neurosurgery;  Laterality: N/A;    Family History  Problem Relation Age of Onset  . Abnormal EKG Neg Hx   . Abnormal newborn screen Neg Hx   . Achalasia Neg Hx   . Achondroplasia Neg Hx   . Acne Neg Hx   . Acromegaly Neg Hx   . Actinic keratosis Neg Hx   . Acute lymphoblastic leukemia Neg Hx   . Addison's disease Neg Hx   . Adrenal disorder Neg Hx   . Albinism Neg Hx   . Alcohol abuse Neg Hx   . Alkaptonuria Neg Hx   . Allergic rhinitis Neg Hx   . Allergies Neg Hx   . Allergy (severe) Neg Hx   . Alopecia Neg Hx   . Alpha-1 antitrypsin deficiency Neg Hx   .  Alport syndrome Neg Hx   . ALS Neg Hx   . Alzheimer's disease Neg Hx   . Ambiguous genitalia Neg Hx   . Amblyopia Neg Hx   . Amenorrhea Neg Hx   . Anal fissures Neg Hx   . Anemia Neg Hx   . Anesthesia problems Neg Hx   . Aneurysm Neg Hx   . Angelman syndrome Neg Hx   . Angina Neg Hx   . Angioedema Neg Hx   . Ankylosing spondylitis Neg Hx   . Anorectal malformation Neg Hx   . Anorexia nervosa Neg Hx   . Anti-cardiolipin syndrome Neg Hx   . Antithrombin III deficiency Neg Hx   . Anuerysm Neg Hx   . Anxiety disorder Neg Hx   . Aortic aneurysm Neg Hx   . Aortic dissection Neg Hx   . Aortic stenosis Neg Hx   . Aphthous stomatitis Neg Hx   . Aplastic anemia Neg Hx   . Appendicitis Neg Hx   . Apraxia Neg Hx   . Arnold-Chiari malformation Neg Hx   . Arrhythmia Neg Hx   . Arthritis Neg Hx   . Asperger's syndrome  Neg Hx   . Asthma Neg Hx   . Ataxia Neg Hx   . Ataxia telangiectasia Neg Hx   . Atopy Neg Hx   . Atrial fibrillation Neg Hx   . Atrophic kidney Neg Hx   . Auditory processing disorder Neg Hx   . Autism Neg Hx   . Autism spectrum disorder Neg Hx   . Autoimmune disease Neg Hx   . AVM Neg Hx   . Baker's cyst Neg Hx   . Barrett's esophagus Neg Hx   . Bartter's syndrome Neg Hx   . Basal cell carcinoma Neg Hx   . Behavior problems Neg Hx   . Bell's palsy Neg Hx   . Benign prostatic hyperplasia Neg Hx   . Bipolar disorder Neg Hx   . Birth defects Neg Hx   . Birth marks Neg Hx   . Blindness Neg Hx   . Bone cancer Neg Hx   . BOR syndrome Neg Hx   . Bow legs Neg Hx   . Bradycardia Neg Hx   . Brain cancer Neg Hx   . BRCA 1/2 Neg Hx   . Breast cancer Neg Hx   . Broken bones Neg Hx   . Bronchiolitis Neg Hx   . Bronchopulmonary dysplasia Neg Hx   . Bruton's disease Neg Hx   . Bulemia Neg Hx   . Bullous pemphigoid Neg Hx   . Bunion Neg Hx   . Bursitis Neg Hx   . Caf-au-lait spots Neg Hx   . Calcium disorder Neg Hx   . Canavan disease Neg Hx   . Cancer Neg Hx   . Cardiomyopathy Neg Hx   . Carpal tunnel syndrome Neg Hx   . Cataracts Neg Hx   . Celiac disease Neg Hx   . Cerebral aneurysm Neg Hx   . Cerebral palsy Neg Hx   . Cervical cancer Neg Hx   . Cervical polyp Neg Hx   . Cervicitis Neg Hx   . Chalasia Neg Hx   . Chalazion Neg Hx   . Charcot-Marie-Tooth disease Neg Hx   . Chediak-Higashi syndrome Neg Hx   . Chiari malformation Neg Hx   . Childhood respiratory disease Neg Hx   . Choanal atresia Neg Hx   . Cholecystitis Neg Hx   .  Cholelithiasis Neg Hx   . Cholesteatoma Neg Hx   . Chondromalacia Neg Hx   . Chorea Neg Hx   . Chromosomal disorder Neg Hx   . Chronic bronchitis Neg Hx   . Chronic fatigue Neg Hx   . Chronic granulomatous disease Neg Hx   . Chronic infections Neg Hx   . Cirrhosis Neg Hx   . Cleft lip Neg Hx   . Cleft palate Neg Hx   . Clotting  disorder Neg Hx   . Club foot Neg Hx   . Coarctation of the aorta Neg Hx   . Colitis Neg Hx   . Collagen disease Neg Hx   . Colon cancer Neg Hx   . Colon polyps Neg Hx   . Colonic polyp Neg Hx   . Color blindness Neg Hx   . Conduct disorder Neg Hx   . Conductive hearing loss Neg Hx   . Congenital adrenal hyperplasia Neg Hx   . Congenital heart disease Neg Hx   . Conjunctivitis Neg Hx   . Consanguinity Neg Hx   . Constipation Neg Hx   . COPD Neg Hx   . Corneal abrasion Neg Hx   . Corneal ulcer Neg Hx   . Coronary aneurysm Neg Hx   . Coronary artery disease Neg Hx   . Cowden syndrome Neg Hx   . Craniosynostosis Neg Hx   . Cri-du-chat syndrome Neg Hx   . Crohn's disease Neg Hx   . Cushing syndrome Neg Hx   . Cystic fibrosis Neg Hx   . Cystic kidney disease Neg Hx   . Cystinosis Neg Hx   . Decreased libido Neg Hx   . Deep vein thrombosis Neg Hx   . Delayed menopause Neg Hx   . Delayed puberty Neg Hx   . Dementia Neg Hx   . Dental caries Neg Hx   . Depression Neg Hx   . Dermatomyositis Neg Hx   . DES usage Neg Hx   . Developmental delay Neg Hx   . Diabetes Neg Hx   . Diabetes insipidus Neg Hx   . Diabetes type I Neg Hx   . Diabetes type II Neg Hx   . Diabetic kidney disease Neg Hx   . DiGeorge syndrome Neg Hx   . Dilated cardiomyopathy Neg Hx   . Dislocations Neg Hx   . Diverticulitis Neg Hx   . Diverticulosis Neg Hx   . Down syndrome Neg Hx   . Drug abuse Neg Hx   . Dupuytren's contracture Neg Hx   . Dwarfism Neg Hx   . Dysfunctional uterine bleeding Neg Hx   . Dysmenorrhea Neg Hx   . Dyspareunia Neg Hx   . Dysphagia Neg Hx   . Dysrhythmia Neg Hx   . Dystonia Neg Hx   . Early death Neg Hx   . Early menopause Neg Hx   . Early puberty Neg Hx   . Eating disorder Neg Hx   . Eclampsia Neg Hx   . Ectodermal dysplasia Neg Hx   . Eczema Neg Hx   . Edema Neg Hx   . Edward's syndrome Neg Hx   . Ehlers-Danlos syndrome Neg Hx   . Emotional abuse Neg Hx   .  Emphysema Neg Hx   . Encopresis Neg Hx   . Endocrine tumor Neg Hx   . Endocrinopathy Neg Hx   . Endometrial cancer Neg Hx   . Endometriosis Neg Hx   . Enuresis  Neg Hx   . Eosinophilic granuloma Neg Hx   . Epididymitis Neg Hx   . Erectile dysfunction Neg Hx   . Erythema nodosum Neg Hx   . Esophageal cancer Neg Hx   . Esophageal varices Neg Hx   . Esophagitis Neg Hx   . Exostosis Neg Hx   . Fabry's disease Neg Hx   . Factor IX deficiency Neg Hx   . Factor V Leiden deficiency Neg Hx   . Factor VIII deficiency Neg Hx   . Failure to thrive Neg Hx   . Fainting Neg Hx   . Familial dysautonomia Neg Hx   . Familial nephritis Neg Hx   . Familial polyposis Neg Hx   . Fanconi anemia Neg Hx   . Febrile seizures Neg Hx   . Fibrocystic breast disease Neg Hx   . Fibroids Neg Hx   . Fibromyalgia Neg Hx   . Food intolerance Neg Hx   . Fragile X syndrome Neg Hx   . Friedreich's ataxia Neg Hx   . Frontotemporal dementia Neg Hx   . Fuch's dystrophy Neg Hx   . Gait disorder Neg Hx   . Galactosemia Neg Hx   . Gallbladder disease Neg Hx   . Gaucher's disease Neg Hx   . Genodermatoses Neg Hx   . GER disease Neg Hx   . Gestational diabetes Neg Hx   . GI problems Neg Hx   . Glaucoma Neg Hx   . Glomerulonephritis Neg Hx   . Glucose-6-phos deficiency Neg Hx   . Glycogen storage disease Neg Hx   . Goiter Neg Hx   . Gonadal disorder Neg Hx   . Gout Neg Hx   . Graves' disease Neg Hx   . Growth hormone deficiency Neg Hx   . GU problems Neg Hx   . Hammer toes Neg Hx   . Hartnup's disease Neg Hx   . Hashimoto's thyroiditis Neg Hx   . Healthy Neg Hx   . Hearing loss Neg Hx   . Heart block Neg Hx   . Heart defect Neg Hx   . Heart disease Neg Hx   . Heart failure Neg Hx   . Heart murmur Neg Hx   . Hemangiomas Neg Hx   . Hematuria Neg Hx   . Hemochromatosis Neg Hx   . Hemolytic uremic syndrome Neg Hx   . Hemophilia Neg Hx   . Henoch-Schonlein purpura Neg Hx   . Hepatitis Neg Hx   .  Hepatomegaly Neg Hx   . Hereditary spherocytosis Neg Hx   . Hernia Neg Hx   . Hiatal hernia Neg Hx   . High arches Neg Hx   . Hip dysplasia Neg Hx   . Hip fracture Neg Hx   . Hirschsprung's disease Neg Hx   . Hirsutism Neg Hx   . Histiocytosis X Neg Hx   . HIV Neg Hx   . HLA-B27 positive Neg Hx   . Hodgkin's lymphoma Neg Hx   . Homocystinuria Neg Hx   . Hunter's disease Neg Hx   . Huntington's disease Neg Hx   . Hydrocele Neg Hx   . Hydrocephalus Neg Hx   . Hygroma Neg Hx   . Hypercalcemia Neg Hx   . Hypereosinophilic syndrome Neg Hx   . Hyperinsulinemia Neg Hx   . Hyperkalemia Neg Hx   . Hyperlipidemia Neg Hx   . Hypermobility Neg Hx   . Hypernasality Neg Hx   .  Hyperopia Neg Hx   . Hyperparathyroidism Neg Hx   . Hypersomnolence Neg Hx   . Hypertension Neg Hx   . Hyperthyroidism Neg Hx   . Hypertrophic cardiomyopathy Neg Hx   . Hypokalemia Neg Hx   . Hypoparathyroidism Neg Hx   . Hypoplastic kidney Neg Hx   . Hypotension Neg Hx   . Hypothyroidism Neg Hx   . Idiopathic pulmonary fibrosis Neg Hx   . Idiopathic torsion dystonia Neg Hx   . IgA nephropathy Neg Hx   . Immunodeficiency Neg Hx   . Imperforate anus Neg Hx   . Impotence Neg Hx   . Impulse control disorder Neg Hx   . Incompetent cervix Neg Hx   . Infertility Neg Hx   . Inflammatory bowel disease Neg Hx   . Inguinal hernia Neg Hx   . Insomnia Neg Hx   . Insulin resistance Neg Hx   . Interstitial cystitis Neg Hx   . Intestinal malrotation Neg Hx   . Intestinal polyp Neg Hx   . Intracerebral hemorrhage Neg Hx   . Iron deficiency Neg Hx   . Irregular heart beat Neg Hx   . Irritable bowel syndrome Neg Hx   . Jaundice Neg Hx   . Job's syndrome Neg Hx   . Joint hypermobility Neg Hx   . Juvenile idiopathic arthritis Neg Hx   . Kartagener's syndrome Neg Hx   . Kawasaki disease Neg Hx   . Keloids Neg Hx   . Kennedy's disease Neg Hx   . Keratoconus Neg Hx   . Kidney cancer Neg Hx   . Kidney disease Neg  Hx   . Kidney failure Neg Hx   . Kidney nephrosis Neg Hx   . Klinefelter's syndrome Neg Hx   . Krabbe disease Neg Hx   . Kyphosis Neg Hx   . Labyrinthitis Neg Hx   . Lactose intolerance Neg Hx   . Language disorder Neg Hx   . Lead poisoning Neg Hx   . Learning disabilities Neg Hx   . Legg-Calve-Perthes disease Neg Hx   . Lesch-Nyhan syndrome Neg Hx   . Leukemia Neg Hx   . Lichen planus Neg Hx   . Li-Fraumeni syndrome Neg Hx   . Liver cancer Neg Hx   . Liver disease Neg Hx   . Long QT syndrome Neg Hx   . Lumbar disc disease Neg Hx   . Lung cancer Neg Hx   . Lung disease Neg Hx   . Lupus Neg Hx   . Lymphoma Neg Hx   . Macrocephaly Neg Hx   . Macrosomia Neg Hx   . Macular degeneration Neg Hx   . Malignant hypertension Neg Hx   . Malignant hyperthermia Neg Hx   . Maple syrup urine disease Neg Hx   . Marfan syndrome Neg Hx   . Mastocytosis Neg Hx   . Melanoma Neg Hx   . Memory loss Neg Hx   . Meniere's disease Neg Hx   . Menstrual problems Neg Hx   . Mental illness Neg Hx   . Mental retardation Neg Hx   . Metabolic syndrome Neg Hx   . Microcephaly Neg Hx   . Migraines Neg Hx   . Milk intolerance Neg Hx   . Miscarriages / Stillbirths Neg Hx   . Mitochondrial disorder Neg Hx   . Mitral valve prolapse Neg Hx   . Motor neuron disease Neg Hx   . Movement disorder Neg  Hx   . Moyamoya disease Neg Hx   . Multiple births Neg Hx   . Multiple endocrine neoplasia Neg Hx   . Multiple fractures Neg Hx   . Multiple myeloma Neg Hx   . Multiple sclerosis Neg Hx   . Muscle cancer Neg Hx   . Muscular dystrophy Neg Hx   . Myasthenia gravis Neg Hx   . Myelodysplastic syndrome Neg Hx   . Myocarditis Neg Hx   . Myoclonus Neg Hx   . Myopathy Neg Hx   . Nail disease Neg Hx   . Narcolepsy Neg Hx   . Nephritis Neg Hx   . Nephrolithiasis Neg Hx   . Nephrotic syndrome Neg Hx   . Neural tube defect Neg Hx   . Neurodegenerative disease Neg Hx   . Neurofibromatosis Neg Hx   .  Neuromuscular disorder Neg Hx   . Neuropathy Neg Hx   . Neutropenia Neg Hx   . Nevi Neg Hx   . Niemann-Pick disease Neg Hx   . Night blindness Neg Hx   . Nocturnal enuresis Neg Hx   . Nystagmus Neg Hx   . Obesity Neg Hx   . OCD Neg Hx   . ODD Neg Hx   . Orchitis Neg Hx   . Osler-Weber-Rendu syndrome Neg Hx   . Osteoarthritis Neg Hx   . Osteochondroma Neg Hx   . Osteogenesis imperfecta Neg Hx   . Osteopenia Neg Hx   . Osteoporosis Neg Hx   . Osteosarcoma Neg Hx   . Osteosclerosis Neg Hx   . Other Neg Hx   . Otitis media Neg Hx   . Ovarian cancer Neg Hx   . Ovarian cysts Neg Hx   . Oxalosis Neg Hx   . Paget's disease of bone Neg Hx   . Pancreatic cancer Neg Hx   . Pancreatitis Neg Hx   . Panhypopituitarism Neg Hx   . Panic disorder Neg Hx   . Paranoid behavior Neg Hx   . Parasomnia Neg Hx   . Parkinsonism Neg Hx   . Patau's syndrome Neg Hx   . Pathological gambling Neg Hx   . PDD Neg Hx   . Pectus carinatum Neg Hx   . Pectus excavatum Neg Hx   . Pelvic inflammatory disease Neg Hx   . Pemphigus vulgaris Neg Hx   . Penile cancer Neg Hx   . Periodic limb movement Neg Hx   . Peripheral vascular disease Neg Hx   . Pernicious anemia Neg Hx   . Personality disorder Neg Hx   . Pes cavus Neg Hx   . Physical abuse Neg Hx   . Polo Riley sequence Neg Hx   . PKU Neg Hx   . Plantar fasciitis Neg Hx   . Pleurisy Neg Hx   . Pneumonia Neg Hx   . Polychondritis Neg Hx   . Polycystic kidney disease Neg Hx   . Polycystic ovary syndrome Neg Hx   . Polycythemia Neg Hx   . Polymyalgia rheumatica Neg Hx   . Polymyositis Neg Hx   . Pompe disease Neg Hx   . Positive PPD/TB Exposure Neg Hx   . Posterior urethral valves Neg Hx   . Post-traumatic stress disorder Neg Hx   . Prader-Willi syndrome Neg Hx   . Premature birth Neg Hx   . Premature ovarian failure Neg Hx   . Preterm labor Neg Hx   . Prolactinoma Neg Hx   .  Prostate cancer Neg Hx   . Prostatitis Neg Hx   . Protein C  deficiency Neg Hx   . Protein S deficiency Neg Hx   . Proteinuria Neg Hx   . Prune belly syndrome Neg Hx   . Pruritis Neg Hx   . Pseudochol deficiency Neg Hx   . Pseudotumor cerebri Neg Hx   . Psoriasis Neg Hx   . Psychosis Neg Hx   . Ptosis Neg Hx   . Pulmonary embolism Neg Hx   . Pulmonary fibrosis Neg Hx   . Pyelonephritis Neg Hx   . Pyloric stenosis Neg Hx   . Pyruvate dehydrogenase deficiency Neg Hx   . Rashes / Skin problems Neg Hx   . Raynaud syndrome Neg Hx   . Rectal cancer Neg Hx   . Recurrent abdominal pain Neg Hx   . Reiter's syndrome Neg Hx   . Renal tubular acidosis Neg Hx   . Restless legs syndrome Neg Hx   . Retinal degeneration Neg Hx   . Retinal detachment Neg Hx   . Retinitis pigmentosa Neg Hx   . Retinoblastoma Neg Hx   . Retinopathy of prematurity Neg Hx   . Reye's syndrome Neg Hx   . Rheum arthritis Neg Hx   . Rheumatic fever Neg Hx   . Rheumatologic disease Neg Hx   . Rickets Neg Hx   . Rosacea Neg Hx   . Sacroiliitis Neg Hx   . Sarcoidosis Neg Hx   . Schizophrenia Neg Hx   . Scleritis Neg Hx   . Scleroderma Neg Hx   . Scoliosis Neg Hx   . Seasonal affective disorder Neg Hx   . Seizures Neg Hx   . Selective mutism Neg Hx   . Sensorineural hearing loss Neg Hx   . Severe combined immunodeficiency Neg Hx   . Severe sprains Neg Hx   . Sexual abuse Neg Hx   . Short stature Neg Hx   . Sick sinus syndrome Neg Hx   . Sickle cell anemia Neg Hx   . Sickle cell trait Neg Hx   . SIDS Neg Hx   . Single kidney Neg Hx   . Sinusitis Neg Hx   . Sjogren's syndrome Neg Hx   . Skeletal dysplasia Neg Hx   . Skin cancer Neg Hx   . Skin telangiectasia Neg Hx   . Sleep apnea Neg Hx   . Sleep disorder Neg Hx   . Sleep walking Neg Hx   . Snoring Neg Hx   . Social phobia Neg Hx   . Speech disorder Neg Hx   . Spina bifida Neg Hx   . Spinal muscular atrophy Neg Hx   . Splenomegaly Neg Hx   . Spondyloarthropathy Neg Hx   . Spondylolisthesis Neg Hx   .  Spondylolysis Neg Hx   . Squamous cell carcinoma Neg Hx   . Stevens-Johnson syndrome Neg Hx   . Stickler syndrome Neg Hx   . Stomach cancer Neg Hx   . Strabismus Neg Hx   . Stroke Neg Hx   . Stuttering Neg Hx   . Subarachnoid hemorrhage Neg Hx   . Sudden death Neg Hx   . Suicidality Neg Hx   . Supraventricular tachycardia Neg Hx   . Swallowing difficulties Neg Hx   . Tall stature Neg Hx   . Tay-Sachs disease Neg Hx   . Testicular cancer Neg Hx   . Thalassemia Neg Hx   .  Throat cancer Neg Hx   . Thrombocytopenia Neg Hx   . Thrombophilia Neg Hx   . Thrombophlebitis Neg Hx   . Thrombosis Neg Hx   . Thymic aplasia Neg Hx   . Thyroid cancer Neg Hx   . Thyroid disease Neg Hx   . Thyroid nodules Neg Hx   . Tics Neg Hx   . Tongue cancer Neg Hx   . Torticollis Neg Hx   . Tourette syndrome Neg Hx   . Tracheal cancer Neg Hx   . Tracheoesophageal fistual Neg Hx   . Tracheomalacia Neg Hx   . Transient ischemic attack Neg Hx   . Treacher Collins syndrome Neg Hx   . Tremor Neg Hx   . Tuberculosis Neg Hx   . Tuberous sclerosis Neg Hx   . Turner syndrome Neg Hx   . Ulcerative colitis Neg Hx   . Ulcers Neg Hx   . Undescended testes Neg Hx   . Unexplained death Neg Hx   . Urinary tract infection Neg Hx   . Urolithiasis Neg Hx   . Urticaria Neg Hx   . Uterine cancer Neg Hx   . Uveitis Neg Hx   . Vaginal cancer Neg Hx   . Valvular heart disease Neg Hx   . Vasculitis Neg Hx   . Velocardiofacial syndrome Neg Hx   . Venous thrombosis Neg Hx   . Verruca vulgaris  Neg Hx   . Vesicoureteral reflux Neg Hx   . Vision loss Neg Hx   . Vitamin D deficiency Neg Hx   . Vitiligo Neg Hx   . Voice disorder Neg Hx   . Von Gierke's disease Neg Hx   . Von Hippel-Lindau syndrome Neg Hx   . Von Willebrand disease Neg Hx   . Werdnig Hoffman Disease Neg Hx   . Williams syndrome Neg Hx   . Wilm's tumor Neg Hx   . Wilson's disease Neg Hx   . Wiskott-Aldrich syndrome Neg Hx   . Yves Dill Parkinson  White syndrome Neg Hx   . Xeroderma pigmentosa Neg Hx   . Heart attack Mother   . Heart attack Father     Social History:  reports that she has quit smoking. She has never used smokeless tobacco. She reports that she does not drink alcohol or use illicit drugs.  Allergies:  Allergies  Allergen Reactions  . Morphine And Related     nausea  . Tramadol Other (See Comments)    unknown  . Penicillins Rash    Medications: I have reviewed the patient's current medications.  Results for orders placed during the hospital encounter of 04/29/14 (from the past 48 hour(s))  CBC WITH DIFFERENTIAL     Status: Abnormal   Collection Time    04/29/14  5:31 PM      Result Value Ref Range   WBC 8.6  4.0 - 10.5 K/uL   RBC 3.61 (*) 3.87 - 5.11 MIL/uL   Hemoglobin 11.3 (*) 12.0 - 15.0 g/dL   HCT 35.7 (*) 36.0 - 46.0 %   MCV 98.9  78.0 - 100.0 fL   MCH 31.3  26.0 - 34.0 pg   MCHC 31.7  30.0 - 36.0 g/dL   RDW 13.9  11.5 - 15.5 %   Platelets 288  150 - 400 K/uL   Neutrophils Relative % 71  43 - 77 %   Neutro Abs 6.1  1.7 - 7.7 K/uL   Lymphocytes Relative 16  12 -  46 %   Lymphs Abs 1.4  0.7 - 4.0 K/uL   Monocytes Relative 12  3 - 12 %   Monocytes Absolute 1.0  0.1 - 1.0 K/uL   Eosinophils Relative 1  0 - 5 %   Eosinophils Absolute 0.1  0.0 - 0.7 K/uL   Basophils Relative 0  0 - 1 %   Basophils Absolute 0.0  0.0 - 0.1 K/uL  COMPREHENSIVE METABOLIC PANEL     Status: Abnormal   Collection Time    04/29/14  5:31 PM      Result Value Ref Range   Sodium 133 (*) 137 - 147 mEq/L   Potassium 3.9  3.7 - 5.3 mEq/L   Chloride 98  96 - 112 mEq/L   CO2 24  19 - 32 mEq/L   Glucose, Bld 120 (*) 70 - 99 mg/dL   BUN 14  6 - 23 mg/dL   Creatinine, Ser 0.60  0.50 - 1.10 mg/dL   Calcium 9.6  8.4 - 10.5 mg/dL   Total Protein 6.1  6.0 - 8.3 g/dL   Albumin 2.7 (*) 3.5 - 5.2 g/dL   AST 64 (*) 0 - 37 U/L   ALT 27  0 - 35 U/L   Alkaline Phosphatase 78  39 - 117 U/L   Total Bilirubin 0.2 (*) 0.3 - 1.2 mg/dL    GFR calc non Af Amer 82 (*) >90 mL/min   GFR calc Af Amer >90  >90 mL/min   Comment: (NOTE)     The eGFR has been calculated using the CKD EPI equation.     This calculation has not been validated in all clinical situations.     eGFR's persistently <90 mL/min signify possible Chronic Kidney     Disease.   Anion gap 11  5 - 15  TSH     Status: None   Collection Time    04/29/14  5:31 PM      Result Value Ref Range   TSH 1.480  0.350 - 4.500 uIU/mL  I-STAT CG4 LACTIC ACID, ED     Status: None   Collection Time    04/29/14  5:45 PM      Result Value Ref Range   Lactic Acid, Venous 1.45  0.5 - 2.2 mmol/L  URINALYSIS, ROUTINE W REFLEX MICROSCOPIC     Status: None   Collection Time    04/29/14  5:50 PM      Result Value Ref Range   Color, Urine YELLOW  YELLOW   APPearance CLEAR  CLEAR   Specific Gravity, Urine 1.015  1.005 - 1.030   pH 7.0  5.0 - 8.0   Glucose, UA NEGATIVE  NEGATIVE mg/dL   Hgb urine dipstick NEGATIVE  NEGATIVE   Bilirubin Urine NEGATIVE  NEGATIVE   Ketones, ur NEGATIVE  NEGATIVE mg/dL   Protein, ur NEGATIVE  NEGATIVE mg/dL   Urobilinogen, UA 0.2  0.0 - 1.0 mg/dL   Nitrite NEGATIVE  NEGATIVE   Leukocytes, UA NEGATIVE  NEGATIVE   Comment: MICROSCOPIC NOT DONE ON URINES WITH NEGATIVE PROTEIN, BLOOD, LEUKOCYTES, NITRITE, OR GLUCOSE <1000 mg/dL.    Ct Head Wo Contrast  04/29/2014   CLINICAL DATA:  Altered mental status, recent urinary tract infection.  EXAM: CT HEAD WITHOUT CONTRAST  TECHNIQUE: Contiguous axial images were obtained from the base of the skull through the vertex without intravenous contrast.  COMPARISON:  CT head April 18, 2014  FINDINGS: Degenerating left holohemispheric subdural hematoma previously up to  8.4 mm, now 8 mm, without Re bleed. 3 mm left-to-right midline shift, similar.  The ventricles and sulci are overall normal for age. No intraparenchymal hemorrhage, mass effect nor midline shift. Confluent supratentorial white matter hypodensities again  noted. No acute large vascular territory infarcts. Remote left cerebellar infarcts.  Basal cisterns are patent. Moderate calcific atherosclerosis of the carotid siphons.  No skull fracture. The included ocular globes and orbital contents are non-suspicious. Status post bilateral ocular lens implants. Partially imaged left maxillary sinus bony wall thickening mucosal thickening, consistent with chronic sinusitis. No paranasal sinus air-fluid levels. Mastoid air cells are well aerated.  IMPRESSION: Degenerating left holohemispheric contracting subdural hematoma, 3 mm residual left-to-right midline shift without rebleed.  Involutional changes. Severe white matter changes suggest chronic small vessel ischemic disease with remote is left cerebellar infarct.  Findings discussed with and reconfirmed by CHRISTOPHER LAWYER on8/3/2015at6:15 pm.   Electronically Signed   By: Elon Alas   On: 04/29/2014 18:18   Dg Chest Port 1 View  04/29/2014   CLINICAL DATA:  Altered mental status  EXAM: PORTABLE CHEST - 1 VIEW  COMPARISON:  03/24/2014  FINDINGS: Right arm PICC tip in the mid SVC.  Improved aeration in the lung bases since the prior study. Small right effusion and minimal right lower lobe atelectasis have improved. Negative for heart failure.  IMPRESSION: Improved aeration in the lung bases. Small right effusion and mild right lower lobe atelectasis persist.   Electronically Signed   By: Franchot Gallo [name]   On: 04/29/2014 19:01    Review of Systems  HENT: Negative.   Eyes: Negative.   Respiratory: Negative.   Cardiovascular: Negative.   Gastrointestinal: Negative.   Genitourinary: Negative.   Musculoskeletal: Positive for falls.  Skin: Negative.   Neurological: Positive for speech change and weakness.  Endo/Heme/Allergies: Negative.   Psychiatric/Behavioral: Negative.    Blood pressure 139/56, pulse 76, temperature 99.4 F (37.4 C), temperature source Rectal, resp. rate 23, height $RemoveBe'5\' 7"'sAmryJNdJ$  (1.702 m),  weight 58.968 kg (130 lb), SpO2 96.00%. Physical Exam  Constitutional: She is oriented to person, place, and time. She appears well-developed and well-nourished. No distress.  HENT:  Head: Normocephalic.  Right Ear: External ear normal.  Left Ear: External ear normal.  Mouth/Throat: Oropharynx is clear and moist.  Eyes: Conjunctivae and EOM are normal. Pupils are equal, round, and reactive to light.  Neck: Normal range of motion. Neck supple.  Respiratory: Effort normal and breath sounds normal.  Musculoskeletal: Normal range of motion.  Neurological: She is alert and oriented to person, place, and time. She has normal strength and normal reflexes. She is not disoriented. No cranial nerve deficit or sensory deficit. Coordination normal. She displays no Babinski's sign on the right side. She displays no Babinski's sign on the left side.  Expressive aphasia, mild receptive aphasia. Speech is non fluent. Unable to point to an object which gives off light. Knows brother's name, understands situation. Understands that she is not speaking normally.Did not assess gait.   Skin:  Wound is clean, dry, no apparent signs of infection.     Assessment/Plan: Do not believe that this improving subdural which is quite small is causing lower extremity difficulties, nor such a dense aphasia. If no improvement, would recommend neurology consult, possible mri brain. Medically stable at this time, in no distress. Non focal motor exam. Dr. Ronnald Ramp will follow.   Baleria Wyman L 04/29/2014, 7:51 PM

## 2014-04-29 NOTE — ED Provider Notes (Signed)
CSN: [id number]     Arrival date & time 04/29/14  1553 History   First MD Initiated Contact with Patient 04/29/14 1615     Chief Complaint  Patient presents with  . Altered Mental Status     (Consider location/radiation/quality/duration/timing/severity/associated sxs/prior Treatment) HPI  Patient presents to the emergency department with altered mental status that started Thursday.  The brother of the patient is giving the history.  He states that the patient has been treated recently for postoperative infection.  The patient has been receiving IV antibiotics over the last 4 weeks.  The patient had a recent fall that ER visit last week.  The brother reports that time their is no abnormalities on her testing.  The brother states, that she's been worsening in her status since Thursday.  The patient has been having alterations in her normal mentation.  She is also been found on the floor.  Several times, but has not fallen since her previous episode.  The brother states, that the patient was not making sense, today, and the home health nurse felt she needed evaluation and not able to get any clear history from the patient Past Medical History  Diagnosis Date  . Hypertension   . Rheumatoid arteritis   . Phlebitis   . Deep vein thrombophlebitis of leg   . Fractured pelvis     fall 2011  . Thyroid disease     hypothyroidism  . Humerus fracture   . Thrombocytopenia   . Leukocytosis   . Hypothyroidism   . Phlebitis      Recurrent phlebitis  . Hematoma      Right superior and inferior pubic rami fractures with hematoma adjacent to the right ramus fracture  . Urinary tract infection   . Humerus fracture      Right proximal humerus fracture  . Osteoarthritis     for which she takes chronic prednisone  . Macular degeneration   . Chronic anticoagulation   . Fracture of multiple pubic rami 01/16/2013  . Dementia     MILD   Past Surgical History  Procedure Laterality Date  . Left hip open  reduction    . Cholecystectomy    . Abdominal hysterectomy  s  . Sinus surgery with instatrak    . Lumbar laminectomy/decompression microdiscectomy N/A 03/06/2014    Procedure: LUMBAR LAMINECTOMY/DECOMPRESSION LUMBAR THREE, FOUR, AND FIVE ;  Surgeon: Tia Alert, MD;  Location: [facility];  Service: Neurosurgery;  Laterality: N/A;  LUMBAR LAMINECTOMY/DECOMPRESSION LUMBAR THREE, FOUR, AND FIVE   . Lumbar wound debridement N/A 03/21/2014    Procedure: Incision and drainage of lumbar wound;  Surgeon: Tia Alert, MD;  Location: [facility];  Service: Neurosurgery;  Laterality: N/A;     OB History   Grav Para Term Preterm Abortions TAB SAB Ect Mult Living                 Review of Systems  Level 5 caveat applies due to altered mental status  Allergies  Morphine and related; Tramadol; and Penicillins  Home Medications   Prior to Admission medications   Medication Sig Start Date End Date Taking? Authorizing Provider  alendronate (FOSAMAX) 70 MG tablet Take 70 mg by mouth once a week. Take on Monday.Take with a full glass of water on an empty stomach.   Yes Historical Provider, MD  aspirin EC 81 MG tablet Take 81 mg by mouth daily.   Yes Historical Provider, MD  bisoprolol-hydrochlorothiazide [facility]) 5-6.25  MG per tablet Take 1 tablet by mouth daily.   Yes Historical Provider, MD  calcium citrate-vitamin D (CITRACAL+D) 315-200 MG-UNIT per tablet Take 1 tablet by mouth daily.   Yes Historical Provider, MD  cefTRIAXone 2 g in dextrose 5 % 50 mL Inject 2 g into the vein daily. 03/26/14  Yes Judyann Munson, MD  diazepam (VALIUM) 5 MG tablet Take 5 mg by mouth at bedtime as needed for anxiety or muscle spasms.  01/16/14  Yes Historical Provider, MD  diltiazem (CARDIZEM CD) 240 MG 24 hr capsule Take 240 mg by mouth daily. 11/08/12  Yes [name], MD  folic acid (FOLVITE) 1 MG tablet Take 1 mg by mouth daily.   Yes Historical Provider, MD  levothyroxine (SYNTHROID, LEVOTHROID) 50 MCG  tablet Take 50-100 mcg by mouth See admin instructions. Take 1 tablets (50 mcg) on Monday and Friday, take 2 tablet (100 mcg) on Sunday, Tuesday, Wednesday, Thursday, Saturday 11/12/11  Yes [name], MD  methotrexate (RHEUMATREX) 2.5 MG tablet Take 10 mg by mouth 2 (two) times a week. On Tuesdays and Wednesdays 01/18/14  Yes Historical Provider, MD  predniSONE (DELTASONE) 5 MG tablet Take 5 mg by mouth daily.   Yes Historical Provider, MD   BP 139/56  Pulse 76  Temp(Src) 99.4 F (37.4 C) (Rectal)  Resp 23  Ht 5\' 7"  (1.702 m)  Wt 130 lb (58.968 kg)  BMI 20.36 kg/m2  SpO2 96% Physical Exam  Nursing note and vitals reviewed. Constitutional: She appears well-developed and well-nourished. No distress.  HENT:  Head: Normocephalic and atraumatic.  Mouth/Throat: Oropharynx is clear and moist.  Eyes: Pupils are equal, round, and reactive to light.  Neck: Normal range of motion. Neck supple.  Cardiovascular: Normal rate, regular rhythm and normal heart sounds.  Exam reveals no gallop and no friction rub.   No murmur heard. Pulmonary/Chest: Effort normal and breath sounds normal. No respiratory distress.  Abdominal: Soft. Bowel sounds are normal. She exhibits no distension. There is no tenderness.  Neurological: She is alert. She exhibits normal muscle tone. Coordination normal.  The patient is not able to follow commands real well. She can follow some commands. The patient and do strength testing and this was normal.  Skin: Skin is warm and dry.    ED Course  Procedures (including critical care time) Labs Review Labs Reviewed  CBC WITH DIFFERENTIAL - Abnormal; Notable for the following:    RBC 3.61 (*)    Hemoglobin 11.3 (*)    HCT 35.7 (*)    All other components within normal limits  COMPREHENSIVE METABOLIC PANEL - Abnormal; Notable for the following:    Sodium 133 (*)    Glucose, Bld 120 (*)    Albumin 2.7 (*)    AST 64 (*)    Total Bilirubin 0.2 (*)    GFR calc non Af  Amer 82 (*)    All other components within normal limits  URINE CULTURE  CULTURE, BLOOD (ROUTINE X 2)  CULTURE, BLOOD (ROUTINE X 2)  URINALYSIS, ROUTINE W REFLEX MICROSCOPIC  TSH  I-STAT CG4 LACTIC ACID, ED    Imaging Review Ct Head Wo Contrast  04/29/2014   CLINICAL DATA:  Altered mental status, recent urinary tract infection.  EXAM: CT HEAD WITHOUT CONTRAST  TECHNIQUE: Contiguous axial images were obtained from the base of the skull through the vertex without intravenous contrast.  COMPARISON:  CT head April 18, 2014  FINDINGS: Degenerating left holohemispheric subdural hematoma previously up to 8.4  mm, now 8 mm, without Re bleed. 3 mm left-to-right midline shift, similar.  The ventricles and sulci are overall normal for age. No intraparenchymal hemorrhage, mass effect nor midline shift. Confluent supratentorial white matter hypodensities again noted. No acute large vascular territory infarcts. Remote left cerebellar infarcts.  Basal cisterns are patent. Moderate calcific atherosclerosis of the carotid siphons.  No skull fracture. The included ocular globes and orbital contents are non-suspicious. Status post bilateral ocular lens implants. Partially imaged left maxillary sinus bony wall thickening mucosal thickening, consistent with chronic sinusitis. No paranasal sinus air-fluid levels. Mastoid air cells are well aerated.  IMPRESSION: Degenerating left holohemispheric contracting subdural hematoma, 3 mm residual left-to-right midline shift without rebleed.  Involutional changes. Severe white matter changes suggest chronic small vessel ischemic disease with remote is left cerebellar infarct.  Findings discussed with and reconfirmed by Alannah Averhart on8/3/2015at6:15 pm.   Electronically Signed   By: Awilda Metro   On: 04/29/2014 18:18   Dg Chest Port 1 View  04/29/2014   CLINICAL DATA:  Altered mental status  EXAM: PORTABLE CHEST - 1 VIEW  COMPARISON:  03/24/2014  FINDINGS: Right arm PICC tip  in the mid SVC.  Improved aeration in the lung bases since the prior study. Small right effusion and minimal right lower lobe atelectasis have improved. Negative for heart failure.  IMPRESSION: Improved aeration in the lung bases. Small right effusion and mild right lower lobe atelectasis persist.   Electronically Signed   By: Marlan Palau [name]   On: 04/29/2014 19:01     EKG Interpretation None     I received a phone call from the radiologist, who states, that the patient had a subdural hematoma  That was clearly visible on her previous CT scan from last week, but this was reported out as normal.  She states that the hematoma appears to be smaller than previous, but is still present, causing 3 mm of midline shift.  I spoke with neurosurgery, who will evaluate the patient, and they would like for me to speak with her primary care doctor about admission to the hospital.    Carlyle Dolly, PA-C 05/01/14 1143

## 2014-04-29 NOTE — ED Notes (Signed)
Patient returned from CT and hooked back up to monitor.

## 2014-04-30 ENCOUNTER — Inpatient Hospital Stay (HOSPITAL_COMMUNITY): Payer: Medicare HMO

## 2014-04-30 ENCOUNTER — Encounter (HOSPITAL_COMMUNITY): Payer: Self-pay | Admitting: Neurology

## 2014-04-30 DIAGNOSIS — R4701 Aphasia: Secondary | ICD-10-CM | POA: Diagnosis present

## 2014-04-30 LAB — BASIC METABOLIC PANEL
Anion gap: 14 (ref 5–15)
BUN: 9 mg/dL (ref 6–23)
CO2: 24 mEq/L (ref 19–32)
Calcium: 9.3 mg/dL (ref 8.4–10.5)
Chloride: 99 mEq/L (ref 96–112)
Creatinine, Ser: 0.64 mg/dL (ref 0.50–1.10)
GFR, EST NON AFRICAN AMERICAN: 80 mL/min — AB (ref >90)
Glucose, Bld: 146 mg/dL — ABNORMAL HIGH (ref 70–99)
POTASSIUM: 3.7 meq/L (ref 3.7–5.3)
SODIUM: 137 meq/L (ref 137–147)

## 2014-04-30 LAB — URINE CULTURE
Colony Count: NO GROWTH
Culture: NO GROWTH

## 2014-04-30 LAB — CBC
HEMATOCRIT: 35.9 % — AB (ref 36.0–46.0)
Hemoglobin: 11.7 g/dL — ABNORMAL LOW (ref 12.0–15.0)
MCH: 32.1 pg (ref 26.0–34.0)
MCHC: 32.6 g/dL (ref 30.0–36.0)
MCV: 98.6 fL (ref 78.0–100.0)
Platelets: 297 10*3/uL (ref 150–400)
RBC: 3.64 MIL/uL — ABNORMAL LOW (ref 3.87–5.11)
RDW: 13.7 % (ref 11.5–15.5)
WBC: 8.4 10*3/uL (ref 4.0–10.5)

## 2014-04-30 LAB — TSH: TSH: 1.72 u[IU]/mL (ref 0.350–4.500)

## 2014-04-30 LAB — MRSA PCR SCREENING: MRSA BY PCR: NEGATIVE

## 2014-04-30 MED ORDER — LACOSAMIDE 200 MG/20ML IV SOLN
200.0000 mg | Freq: Once | INTRAVENOUS | Status: AC
  Administered 2014-04-30: 200 mg via INTRAVENOUS
  Filled 2014-04-30: qty 20

## 2014-04-30 MED ORDER — ASPIRIN EC 81 MG PO TBEC
81.0000 mg | DELAYED_RELEASE_TABLET | Freq: Every day | ORAL | Status: DC
  Administered 2014-04-30: 81 mg via ORAL
  Filled 2014-04-30: qty 1

## 2014-04-30 MED ORDER — ASPIRIN 300 MG RE SUPP
300.0000 mg | Freq: Every day | RECTAL | Status: DC
  Filled 2014-04-30: qty 1

## 2014-04-30 MED ORDER — LACOSAMIDE 50 MG PO TABS: 50.0000 mg | ORAL_TABLET | Freq: Two times a day (BID) | ORAL | Status: DC

## 2014-04-30 MED ORDER — ONDANSETRON HCL 4 MG/2ML IJ SOLN
4.0000 mg | Freq: Four times a day (QID) | INTRAMUSCULAR | Status: DC | PRN
  Administered 2014-04-30: 4 mg via INTRAVENOUS
  Filled 2014-04-30: qty 2

## 2014-04-30 NOTE — Progress Notes (Signed)
Has not voided, placed on bedpan only voided about 10 cc's.  Bladder scan done and noted greater than 658 cc's  in bladder.  Dr. Link Snuffer notified and to I/O cath patient.  I/O cath done and received 660 cc's of amber malodorous urine using hospital protocol.  Patient tolerated with out event.  Continue to watch.

## 2014-04-30 NOTE — Progress Notes (Signed)
Utilization review completed.  

## 2014-04-30 NOTE — Progress Notes (Signed)
Rehab Admissions Coordinator Note:  Patient was screened by Trish Mage for appropriateness for an Inpatient Acute Rehab Consult.  At this time, we are recommending Inpatient Rehab consult.  Trish Mage 04/30/2014, 2:48 PM  I can be reached at [phone].

## 2014-04-30 NOTE — Evaluation (Signed)
Physical Therapy Evaluation Patient Details Name: Marissa Parsons MRN: [id number] DOB: Feb 23, 1929 Today's Date: 04/30/2014   History of Present Illness  pt presents with falls, memory deficits, speech deficits, visual deficits and recent L3-5 Lami.  pt being worked up to r/o CVA.    Clinical Impression  Pt difficult to determine cognition status 2/2 speech deficits at this time.  Pt tracked PT towards L side, but did not come past midline to R side.  Pt inconsistently following directions and has improved direction following on L side than R side.  Feel pt would benefit from Speech Therapy for language and cognition, along with a rehab consult as pt would benefit from CIR at D/C.  Will continue to follow.      Follow Up Recommendations CIR    Equipment Recommendations   (TBD)    Recommendations for Other Services Rehab consult;Speech consult     Precautions / Restrictions Precautions Precautions: Fall;Back Precaution Booklet Issued: No Precaution Comments: pt with recent L3-5 Lami.   Restrictions Weight Bearing Restrictions: No      Mobility  Bed Mobility Overal bed mobility: Needs Assistance Bed Mobility: Rolling;Sidelying to Sit;Sit to Sidelying Rolling: Max assist Sidelying to sit: Total assist     Sit to sidelying: Max assist General bed mobility comments: verbal and hand over hand cueing for technique and participation in bed mobility.  pt with minimal effort during mobility, though ? if this is in part due to cognition vs receptive deficits.    Transfers                    Ambulation/Gait                Stairs            Wheelchair Mobility    Modified Rankin (Stroke Patients Only) Modified Rankin (Stroke Patients Only) Pre-Morbid Rankin Score: Moderate disability Modified Rankin: Severe disability     Balance Overall balance assessment: Needs assistance Sitting-balance support: Bilateral upper extremity supported;Feet  supported Sitting balance-Leahy Scale: Poor Sitting balance - Comments: pt requires MinA to maintain balance                                     Pertinent Vitals/Pain Did not indicate pain.      Home Living Family/patient expects to be discharged to:: Inpatient rehab                      Prior Function           Comments: Per chart prior to last admit she was Mod I with AD, however pt with speech deficits and ? cognitive deificts, so difficult to determine PLOF.       Hand Dominance   Dominant Hand: Right    Extremity/Trunk Assessment   Upper Extremity Assessment: Defer to OT evaluation           Lower Extremity Assessment: RLE deficits/detail;LLE deficits/detail RLE Deficits / Details: PROM WFL.  pt not following directions consistnetly to assess strength, but did attempt to lift LE once when donning socks in bed.   LLE Deficits / Details: pt does move L LE actively, but not consistently to direction.       Communication   Communication: Receptive difficulties;Expressive difficulties  Cognition Arousal/Alertness: Awake/alert Behavior During Therapy: Flat affect Overall Cognitive Status: Impaired/Different from baseline  General Comments: Difficult to assess cognition 2/2 speech deficits.  At times pt was able to verbalize words, but inconsistently and followed directions inconsistently.  Improved direction following on L side than R, so ? neglect.      General Comments      Exercises        Assessment/Plan    PT Assessment Patient needs continued PT services  PT Diagnosis Difficulty walking   PT Problem List Decreased strength;Decreased activity tolerance;Decreased balance;Decreased mobility;Decreased coordination;Decreased cognition;Decreased knowledge of use of DME;Decreased safety awareness  PT Treatment Interventions DME instruction;Gait training;Stair training;Functional mobility training;Therapeutic  activities;Balance training;Therapeutic exercise;Neuromuscular re-education;Cognitive remediation;Patient/family education   PT Goals (Current goals can be found in the Care Plan section) Acute Rehab PT Goals Patient Stated Goal: Unable to state PT Goal Formulation: Patient unable to participate in goal setting Time For Goal Achievement: 05/14/14 Potential to Achieve Goals: Good    Frequency Min 4X/week   Barriers to discharge        Co-evaluation               End of Session   Activity Tolerance: Patient limited by fatigue Patient left: in bed (with transport for MRI) Nurse Communication: Mobility status         Time: 8119-1478 PT Time Calculation (min): 14 min   Charges:   PT Evaluation $Initial PT Evaluation Tier I: 1 Procedure PT Treatments $Therapeutic Activity: 8-22 mins   PT G CodesSunny Schlein, [place] [phone] 04/30/2014, 11:34 AM

## 2014-04-30 NOTE — Progress Notes (Signed)
OT Cancellation Note  Patient Details Name: Marissa Parsons MRN: [id number] DOB: Jan 28, 1929   Cancelled Treatment:    Reason Eval/Treat Not Completed: Patient at procedure or test/ unavailable  Harolyn Rutherford Pager: [phone]  04/30/2014, 3:58 PM

## 2014-04-30 NOTE — Procedures (Signed)
ELECTROENCEPHALOGRAM REPORT   Patient: Marissa Parsons       Room #: 8E99 EEG No. ID: [id number] Age: 80 y.o.        Sex: female Referring Physician: Holwerda Report Date:  04/30/2014        Interpreting Physician: Thana Farr D  History: Marissa Parsons is an 80 y.o. female presenting with SDH and aphasia  Medications:  Scheduled: . bisoprolol-hydrochlorothiazide  1 tablet Oral Daily  . cefTRIAXone (ROCEPHIN) IVPB 2 gram/50 mL D5W (Pyxis)  2 g Intravenous Q24H  . diltiazem  240 mg Oral Daily  . folic acid  1 mg Oral Daily  . [START ON 05/01/2014] lacosamide  50 mg Oral BID  . levothyroxine  100 mcg Oral Once per day on Sun Tue Wed Thu Sat  . [START ON 05/03/2014] levothyroxine  50 mcg Oral Once per day on Mon Fri  . methotrexate  10 mg Oral Once per day on Tue Wed  . predniSONE  5 mg Oral Q breakfast  . saccharomyces boulardii  250 mg Oral BID    Conditions of Recording:  This is a 16 channel EEG carried out with the patient in the awake state.  Description:  The background activity is poorly organized but continuous.  The dominant rhythm is a 7 Hz theta rhythm that is most prominent in the parieto-occipital and posterior temporal regions.  This theta activity is noted in the central and temporal regions as well but there is also some intermixed alpha noted.  This theta activity is most prominent over the right hemisphere.  Over the left frontal region this activity is slowed by an underlying polymorphic delta activity.  This slowing is persistent throughout the tracing.   Also noted are multifocal sharp transients.  At times these are noted from the left temporoparietal regions and at other times they are seen in the right frontoparietal region.  On rare occasions a generalized sharp transient is noted as well.  .  Stage II sleep is not obtained. Hyperventilation and intermittent photic stimulation were not performed.  IMPRESSION: This is an abnormal electroencephalogram secondary to  left frontal slowing and multifocal sharp activity.  No evidence of  electrographic seizures was noted.     Thana Farr, MD [facility] [phone] 04/30/2014, 6:40 PM

## 2014-04-30 NOTE — Progress Notes (Signed)
EEG Completed; Results Pending  

## 2014-04-30 NOTE — Consult Note (Signed)
Referring Physician: Holwerda    Chief Complaint: Aphasia  HPI:                                                                                                                                         Marissa Parsons is an 80 y.o. female who is unable to provide history due to being both expressive and receptively aphasic. Per notes, patient sustained a fall back in July in which she had a small left sided SDH with minimal mass effect. Patient returned to ED on 8/3 due to family member noting she was slower to respond, that her memory was not normal, that she was not able to remember names of people she knew well. On arrival to ED initial CT showed a improved SDH. Currently she remains unable to express herself, name objects or follow commands. Neurology was asked to evaluate patient for possible CVA.    Date last known well: Unable to determine Time last known well: Unable to determine tPA Given: No: recent SDH  Past Medical History  Diagnosis Date  . Hypertension   . Rheumatoid arteritis   . Phlebitis   . Deep vein thrombophlebitis of leg   . Fractured pelvis     fall 2011  . Thyroid disease     hypothyroidism  . Humerus fracture   . Thrombocytopenia   . Leukocytosis   . Hypothyroidism   . Phlebitis      Recurrent phlebitis  . Hematoma      Right superior and inferior pubic rami fractures with hematoma adjacent to the right ramus fracture  . Urinary tract infection   . Humerus fracture      Right proximal humerus fracture  . Osteoarthritis     for which she takes chronic prednisone  . Macular degeneration   . Chronic anticoagulation   . Fracture of multiple pubic rami 01/16/2013  . Dementia     MILD    Past Surgical History  Procedure Laterality Date  . Left hip open reduction    . Cholecystectomy    . Abdominal hysterectomy  s  . Sinus surgery with instatrak    . Lumbar laminectomy/decompression microdiscectomy N/A 03/06/2014    Procedure: LUMBAR  LAMINECTOMY/DECOMPRESSION LUMBAR THREE, FOUR, AND FIVE ;  Surgeon: Tia Alert, MD;  Location: [facility];  Service: Neurosurgery;  Laterality: N/A;  LUMBAR LAMINECTOMY/DECOMPRESSION LUMBAR THREE, FOUR, AND FIVE   . Lumbar wound debridement N/A 03/21/2014    Procedure: Incision and drainage of lumbar wound;  Surgeon: Tia Alert, MD;  Location: [facility];  Service: Neurosurgery;  Laterality: N/A;    Family history: unable to obtain due to patient being [place]  Social History:  reports that she has quit smoking. She has never used smokeless tobacco. She reports that she does not drink alcohol or use illicit drugs.  Allergies:  Allergies  Allergen Reactions  .  Morphine And Related     nausea  . Tramadol Other (See Comments)    unknown  . Penicillins Rash    Medications:                                                                                                                           Prior to Admission:  Prescriptions prior to admission  Medication Sig Dispense Refill  . alendronate (FOSAMAX) 70 MG tablet Take 70 mg by mouth once a week. Take on Monday.Take with a full glass of water on an empty stomach.      Marland Kitchen. aspirin EC 81 MG tablet Take 81 mg by mouth daily.      . bisoprolol-hydrochlorothiazide (ZIAC) 5-6.25 MG per tablet Take 1 tablet by mouth daily.      . calcium citrate-vitamin D (CITRACAL+D) 315-200 MG-UNIT per tablet Take 1 tablet by mouth daily.      . cefTRIAXone 2 g in dextrose 5 % 50 mL Inject 2 g into the vein daily.  36 each  0  . diazepam (VALIUM) 5 MG tablet Take 5 mg by mouth at bedtime as needed for anxiety or muscle spasms.       Marland Kitchen. diltiazem (CARDIZEM CD) 240 MG 24 hr capsule Take 240 mg by mouth daily.      . folic acid (FOLVITE) 1 MG tablet Take 1 mg by mouth daily.      Marland Kitchen. levothyroxine (SYNTHROID, LEVOTHROID) 50 MCG tablet Take 50-100 mcg by mouth See admin instructions. Take 1 tablets (50 mcg) on Monday and Friday, take 2 tablet (100 mcg) on Sunday,  Tuesday, Wednesday, Thursday, Saturday      . methotrexate (RHEUMATREX) 2.5 MG tablet Take 10 mg by mouth 2 (two) times a week. On Tuesdays and Wednesdays      . predniSONE (DELTASONE) 5 MG tablet Take 5 mg by mouth daily.       Scheduled: . bisoprolol-hydrochlorothiazide  1 tablet Oral Daily  . cefTRIAXone (ROCEPHIN) IVPB 2 gram/50 mL D5W (Pyxis)  2 g Intravenous Q24H  . diltiazem  240 mg Oral Daily  . folic acid  1 mg Oral Daily  . levothyroxine  100 mcg Oral Once per day on Sun Tue Wed Thu Sat  . [START ON 05/03/2014] levothyroxine  50 mcg Oral Once per day on Mon Fri  . methotrexate  10 mg Oral Once per day on Tue Wed  . predniSONE  5 mg Oral Q breakfast  . saccharomyces boulardii  250 mg Oral BID    ROS:  History obtained from unobtainable from patient due to language barrier   Neurologic Examination:                                                                                                      Blood pressure 101/45, pulse 86, temperature 98.2 F (36.8 C), temperature source Oral, resp. rate 23, height 5\' 7"  (1.702 m), weight 59.648 kg (131 lb 8 oz), SpO2 93.00%.  General: NAD Mental Status: Alert, not able to state where she is, date, or year.  Speech shows both expressive and receptive aphasia.  Unable to follow either verbal or visual commands.  Able to give one word answers appropriately but with sentences is nonfluent. Cranial Nerves: II: Discs flat bilaterally; Visual fields shows a right hemianopsia, pupils equal, round, reactive to light and accommodation III,IV, VI: ptosis not present, extra-ocular motions intact bilaterally V,VII: face symmetric, winces to pain bilaterally VIII: hearing normal bilaterally IX,X: gag reflex present XI: bilateral shoulder shrug XII: midline tongue extension   Motor: Moving all extremities  antigravity.  Sensory: withdraws to noxious stimuli bilaterally UE and LE Deep Tendon Reflexes:  Right: Upper Extremity   Left: Upper extremity   biceps (C-5 to C-6) 2/4   biceps (C-5 to C-6) 2/4 tricep (C7) 2/4    triceps (C7) 2/4 Brachioradialis (C6) 2/4  Brachioradialis (C6) 2/4  Lower Extremity Lower Extremity  quadriceps (L-2 to L-4) 2/4   quadriceps (L-2 to L-4) 2/4 Achilles (S1) 1/4   Achilles (S1) 1/4  Plantars: Right: downgoing   Left: downgoing Cerebellar: Unable to assess due to inability to follow commands.  Gait: unable to assess CV: pulses palpable throughout    Lab Results: Basic Metabolic Panel:  Recent Labs Lab 04/29/14 1731  NA 133*  K 3.9  CL 98  CO2 24  GLUCOSE 120*  BUN 14  CREATININE 0.60  CALCIUM 9.6    Liver Function Tests:  Recent Labs Lab 04/29/14 1731  AST 64*  ALT 27  ALKPHOS 78  BILITOT 0.2*  PROT 6.1  ALBUMIN 2.7*   No results found for this basename: LIPASE, AMYLASE,  in the last 168 hours No results found for this basename: AMMONIA,  in the last 168 hours  CBC:  Recent Labs Lab 04/29/14 1731 04/30/14 0830  WBC 8.6 8.4  NEUTROABS 6.1  --   HGB 11.3* 11.7*  HCT 35.7* 35.9*  MCV 98.9 98.6  PLT 288 297    Cardiac Enzymes: No results found for this basename: CKTOTAL, CKMB, CKMBINDEX, TROPONINI,  in the last 168 hours  Lipid Panel: No results found for this basename: CHOL, TRIG, HDL, CHOLHDL, VLDL, LDLCALC,  in the last 168 hours  CBG: No results found for this basename: GLUCAP,  in the last 168 hours  Microbiology: Results for orders placed during the hospital encounter of 04/29/14  CULTURE, BLOOD (ROUTINE X 2)     Status: None   Collection Time    04/29/14  5:31 PM      Result Value Ref Range Status   Specimen Description BLOOD PICC LINE  Final   Special Requests BOTTLES DRAWN AEROBIC AND ANAEROBIC R5CC B 8CC   Final   Culture  Setup Time     Final   Value: 04/29/2014 22:16     Performed at [facility]   Culture     Final   Value:        BLOOD CULTURE RECEIVED NO GROWTH TO DATE CULTURE WILL BE HELD FOR 5 DAYS BEFORE ISSUING A FINAL NEGATIVE REPORT     Performed at [facility]   Report Status PENDING   Incomplete  CULTURE, BLOOD (ROUTINE X 2)     Status: None   Collection Time    04/29/14  5:38 PM      Result Value Ref Range Status   Specimen Description BLOOD LEFT ARM   Final   Special Requests BOTTLES DRAWN AEROBIC AND ANAEROBIC 6CC   Final   Culture  Setup Time     Final   Value: 04/29/2014 22:17     Performed at [facility]   Culture     Final   Value:        BLOOD CULTURE RECEIVED NO GROWTH TO DATE CULTURE WILL BE HELD FOR 5 DAYS BEFORE ISSUING A FINAL NEGATIVE REPORT     Performed at [facility]   Report Status PENDING   Incomplete  MRSA PCR SCREENING     Status: None   Collection Time    04/29/14 10:45 PM      Result Value Ref Range Status   MRSA by PCR NEGATIVE  NEGATIVE Final   Comment:            The GeneXpert MRSA Assay (FDA     approved for NASAL specimens     only), is one component of a     comprehensive MRSA colonization     surveillance program. It is not     intended to diagnose MRSA     infection nor to guide or     monitor treatment for     MRSA infections.    Coagulation Studies: No results found for this basename: LABPROT, INR,  in the last 72 hours  Imaging: Ct Head Wo Contrast  04/29/2014   CLINICAL DATA:  Altered mental status, recent urinary tract infection.  EXAM: CT HEAD WITHOUT CONTRAST  TECHNIQUE: Contiguous axial images were obtained from the base of the skull through the vertex without intravenous contrast.  COMPARISON:  CT head April 18, 2014  FINDINGS: Degenerating left holohemispheric subdural hematoma previously up to 8.4 mm, now 8 mm, without Re bleed. 3 mm left-to-right midline shift, similar.  The ventricles and sulci are overall normal for age. No intraparenchymal hemorrhage, mass effect nor midline  shift. Confluent supratentorial white matter hypodensities again noted. No acute large vascular territory infarcts. Remote left cerebellar infarcts.  Basal cisterns are patent. Moderate calcific atherosclerosis of the carotid siphons.  No skull fracture. The included ocular globes and orbital contents are non-suspicious. Status post bilateral ocular lens implants. Partially imaged left maxillary sinus bony wall thickening mucosal thickening, consistent with chronic sinusitis. No paranasal sinus air-fluid levels. Mastoid air cells are well aerated.  IMPRESSION: Degenerating left holohemispheric contracting subdural hematoma, 3 mm residual left-to-right midline shift without rebleed.  Involutional changes. Severe white matter changes suggest chronic small vessel ischemic disease with remote is left cerebellar infarct.  Findings discussed with and reconfirmed by CHRISTOPHER LAWYER on8/3/2015at6:15 pm.   Electronically Signed   By: Awilda Metro  On: 04/29/2014 18:18   Dg Chest Port 1 View  04/29/2014   CLINICAL DATA:  Altered mental status  EXAM: PORTABLE CHEST - 1 VIEW  COMPARISON:  03/24/2014  FINDINGS: Right arm PICC tip in the mid SVC.  Improved aeration in the lung bases since the prior study. Small right effusion and minimal right lower lobe atelectasis have improved. Negative for heart failure.  IMPRESSION: Improved aeration in the lung bases. Small right effusion and mild right lower lobe atelectasis persist.   Electronically Signed   By: Marlan Palau [name]   On: 04/29/2014 19:01    Felicie Morn PA-C [facility] [phone]  04/30/2014, 10:00 AM  Patient seen and examined.  Clinical course and management discussed.  Necessary edits performed.  I agree with the above.  Assessment and plan of care developed and discussed below.     Assessment: 80 y.o. female presenting with aphasia after a recent fall.  Head CT from the day of the injury (04/18/14) reveals a left holohemispheric SDH and  no acute infarct. Repeat head CT on 04/29/14 showed some resolution of this SDH.  With development of the aphasia a MRI of the brain was performed as well.  This has been reviewed and shows the SDH to be unchanged from 7/23 and no evidence of an acute infarct.  Aphasia may be secondary to blood irritation of the cortex since largest area of hemorrhage is at the language centers.  EEG shows some sharp activity, particularly on the left with slowing in that area as well.  Can not rule out seizure.   Stroke Risk Factors - hypertension  Recommendations: 1.  Vimpat 200mg  IV now as a loading dose then 50mg  BID maintenance 2.  Continue to hold antiplatelet and anticoagulant therapy 3.  Seizure precautions 4.  Will continue to follow with you  Thana Farr, MD [facility] [phone]  04/30/2014  2:38 PM

## 2014-04-30 NOTE — Progress Notes (Signed)
Physician Daily Progress Note  Subjective: Aphasia this AM. Not able to make confluent sentences or explain why she is here. Does follow general commands. No acute distress   Objective: Vital signs in last 24 hours:   Filed Vitals:   04/29/14 2318 04/29/14 2329 04/30/14 0338 04/30/14 0731  BP: 96/74 129/54 138/49 122/60  Pulse: 70 72 70 86  Temp: 97.2 F (36.2 C)  98.6 F (37 C) 98.2 F (36.8 C)  TempSrc: Oral  Oral Oral  Resp: 21 20 24 23   Height:      Weight:   131 lb 8 oz (59.648 kg)   SpO2: 94% 99% 95% 93%   Weight change:     CBG (last 3)  No results found for this basename: GLUCAP,  in the last 72 hours  Intake/Output from previous day:  Intake/Output Summary (Last 24 hours) at 04/30/14 0809 Last data filed at 04/30/14 0249  Gross per 24 hour  Intake     50 ml  Output    800 ml  Net   -750 ml      Physical Exam General appearance:WF in NAD  HEENT:  MMM, no icterus, EOMI Resp:  CTAB, no crackels  Cardio:  RRR, no MRG  GI: soft, non-tender; bowel sounds normal; no masses,  no organomegaly Extremities: no clubbing, cyanosis or edema Neuro : CN II-XII intact grossly   Labs: Basic Metabolic Panel:  Recent Labs Lab 04/29/14 1731  NA 133*  K 3.9  CL 98  CO2 24  GLUCOSE 120*  BUN 14  CREATININE 0.60  CALCIUM 9.6   GFR Estimated Creatinine Clearance: 50.1 ml/min (by C-G formula based on Cr of 0.6). Liver Function Tests:  Recent Labs Lab 04/29/14 1731  AST 64*  ALT 27  ALKPHOS 78  BILITOT 0.2*  PROT 6.1  ALBUMIN 2.7*   No results found for this basename: LIPASE, AMYLASE,  in the last 168 hours No results found for this basename: AMMONIA,  in the last 168 hours Coagulation profile No results found for this basename: INR, PROTIME,  in the last 168 hours  CBC:  Recent Labs Lab 04/29/14 1731  WBC 8.6  NEUTROABS 6.1  HGB 11.3*  HCT 35.7*  MCV 98.9  PLT 288   Cardiac Enzymes: No results found for this basename: CKTOTAL, CKMB,  CKMBINDEX, TROPONINI,  in the last 168 hours BNP (last 3 results)  Recent Labs  03/24/14 0430  PROBNP 2706.0*   CBG: No results found for this basename: GLUCAP,  in the last 168 hours D-Dimer: No results found for this basename: DDIMER,  in the last 72 hours Hgb A1c: No results found for this basename: HGBA1C,  in the last 72 hours Lipid Profile: No results found for this basename: CHOL, HDL, LDLCALC, TRIG, CHOLHDL, LDLDIRECT,  in the last 72 hours Thyroid function studies:  Recent Labs  04/29/14 1731  TSH 1.480   Anemia work up: No results found for this basename: VITAMINB12, FOLATE, FERRITIN, TIBC, IRON, RETICCTPCT,  in the last 72 hours Sepsis Labs:  Recent Labs Lab 04/29/14 1731 04/29/14 1745  WBC 8.6  --   LATICACIDVEN  --  1.45   Microbiology Recent Results (from the past 240 hour(s))  MRSA PCR SCREENING     Status: None   Collection Time    04/29/14 10:45 PM      Result Value Ref Range Status   MRSA by PCR NEGATIVE  NEGATIVE Final   Comment:  The GeneXpert MRSA Assay (FDA     approved for NASAL specimens     only), is one component of a     comprehensive MRSA colonization     surveillance program. It is not     intended to diagnose MRSA     infection nor to guide or     monitor treatment for     MRSA infections.     . bisoprolol-hydrochlorothiazide  1 tablet Oral Daily  . cefTRIAXone (ROCEPHIN) IVPB 2 gram/50 mL D5W (Pyxis)  2 g Intravenous Q24H  . diltiazem  240 mg Oral Daily  . folic acid  1 mg Oral Daily  . levothyroxine  100 mcg Oral Once per day on Sun Tue Wed Thu Sat  . [START ON 05/03/2014] levothyroxine  50 mcg Oral Once per day on Mon Fri  . methotrexate  10 mg Oral Once per day on Tue Wed  . predniSONE  5 mg Oral Q breakfast  . saccharomyces boulardii  250 mg Oral BID   Continuous Infusions: . sodium chloride 10 mL/hr at 04/30/14 0007     Assessment/Plan:   Aphasia - has 36mm improving SDH that NSG , Dr Yetta Barre, is  following, however question if all symptoms are 2/2 this issue. Checking MRI brain to r/o CVA. Holding all blood thinners. Consulting neurology.   Prior MRSA infection of perispinal lumbar region - on rocephin q24hr via her PICC . Per NSG notes , this needs to be through Friday. Then transition to keflex 500 TID x 4 weeks   Hypothyroid - check TSH. Home meds on board   PPx - SCDs   Dispo - likely to SNF for rehab after etiology of symptoms treated . PT/OT/SW ordered    LOS: 1 day   Godric Lavell 04/30/2014, 8:09 AM

## 2014-05-01 ENCOUNTER — Inpatient Hospital Stay (HOSPITAL_COMMUNITY): Payer: Medicare HMO

## 2014-05-01 DIAGNOSIS — S065XAA Traumatic subdural hemorrhage with loss of consciousness status unknown, initial encounter: Secondary | ICD-10-CM

## 2014-05-01 DIAGNOSIS — W19XXXA Unspecified fall, initial encounter: Secondary | ICD-10-CM

## 2014-05-01 DIAGNOSIS — S065X9A Traumatic subdural hemorrhage with loss of consciousness of unspecified duration, initial encounter: Secondary | ICD-10-CM

## 2014-05-01 LAB — CBC
HEMATOCRIT: 40 % (ref 36.0–46.0)
HEMOGLOBIN: 12.8 g/dL (ref 12.0–15.0)
MCH: 31.6 pg (ref 26.0–34.0)
MCHC: 32 g/dL (ref 30.0–36.0)
MCV: 98.8 fL (ref 78.0–100.0)
Platelets: 314 10*3/uL (ref 150–400)
RBC: 4.05 MIL/uL (ref 3.87–5.11)
RDW: 13.8 % (ref 11.5–15.5)
WBC: 12.3 10*3/uL — ABNORMAL HIGH (ref 4.0–10.5)

## 2014-05-01 LAB — URINALYSIS, ROUTINE W REFLEX MICROSCOPIC
Bilirubin Urine: NEGATIVE
GLUCOSE, UA: NEGATIVE mg/dL
Hgb urine dipstick: NEGATIVE
Ketones, ur: NEGATIVE mg/dL
Leukocytes, UA: NEGATIVE
Nitrite: NEGATIVE
PROTEIN: NEGATIVE mg/dL
Specific Gravity, Urine: 1.015 (ref 1.005–1.030)
UROBILINOGEN UA: 0.2 mg/dL (ref 0.0–1.0)
pH: 7 (ref 5.0–8.0)

## 2014-05-01 LAB — BASIC METABOLIC PANEL
Anion gap: 13 (ref 5–15)
BUN: 9 mg/dL (ref 6–23)
CALCIUM: 9 mg/dL (ref 8.4–10.5)
CO2: 27 mEq/L (ref 19–32)
Chloride: 97 mEq/L (ref 96–112)
Creatinine, Ser: 0.67 mg/dL (ref 0.50–1.10)
GFR, EST NON AFRICAN AMERICAN: 79 mL/min — AB (ref >90)
Glucose, Bld: 101 mg/dL — ABNORMAL HIGH (ref 70–99)
POTASSIUM: 3.9 meq/L (ref 3.7–5.3)
Sodium: 137 mEq/L (ref 137–147)

## 2014-05-01 MED ORDER — FOLIC ACID 5 MG/ML IJ SOLN
1.0000 mg | Freq: Every day | INTRAMUSCULAR | Status: DC
  Administered 2014-05-01 – 2014-05-03 (×3): 1 mg via INTRAVENOUS
  Filled 2014-05-01 (×4): qty 0.2

## 2014-05-01 MED ORDER — SODIUM CHLORIDE 0.9 % IV SOLN
50.0000 mg | Freq: Two times a day (BID) | INTRAVENOUS | Status: DC
  Administered 2014-05-01: 50 mg via INTRAVENOUS
  Filled 2014-05-01 (×2): qty 5

## 2014-05-01 MED ORDER — LACOSAMIDE 200 MG/20ML IV SOLN
100.0000 mg | Freq: Two times a day (BID) | INTRAVENOUS | Status: DC
  Administered 2014-05-01: 100 mg via INTRAVENOUS
  Filled 2014-05-01 (×3): qty 10

## 2014-05-01 MED ORDER — ALBUTEROL SULFATE (2.5 MG/3ML) 0.083% IN NEBU
2.5000 mg | INHALATION_SOLUTION | RESPIRATORY_TRACT | Status: DC | PRN
  Administered 2014-05-01: 2.5 mg via RESPIRATORY_TRACT
  Filled 2014-05-01: qty 3

## 2014-05-01 MED ORDER — LACOSAMIDE 50 MG PO TABS: 50.0000 mg | ORAL_TABLET | Freq: Once | ORAL | Status: DC

## 2014-05-01 MED ORDER — LEVOTHYROXINE SODIUM 100 MCG IV SOLR: 25.0000 ug | INTRAVENOUS | Status: DC

## 2014-05-01 MED ORDER — LEVOTHYROXINE SODIUM 100 MCG IV SOLR
50.0000 ug | INTRAVENOUS | Status: DC
  Administered 2014-05-01: 50 ug via INTRAVENOUS
  Filled 2014-05-01 (×2): qty 5

## 2014-05-01 MED ORDER — FOLIC ACID 5 MG/ML IJ SOLN: 1.0000 mg | Freq: Every day | INTRAMUSCULAR | Status: DC

## 2014-05-01 NOTE — Progress Notes (Signed)
Rehab admissions - Evaluated for possible admission.  I spoke with patient and then called her brother-in-law.  Mr. Cato Mulligan, brother-in-law, expressed dissatisfaction with diagnosis initially and feels that someone at the hospital "dropped the ball".  He feels if hospital had followed up sooner with diagnosis, that something could have been done and patient would not be "in the shape she is now".  He discussed getting a Clinical research associate.  I encouraged him to speak with attending MD and medical staff.  I also informed the assigned RN of my conversation with the brother-in-law.  I do feel the brother-in-law is interested in patient coming to the rehab program here within the hospital.  I will begin the precert process to see if we can get authorization for acute inpatient rehab admission.  Call me for questions.  #[phone]

## 2014-05-01 NOTE — Progress Notes (Signed)
OT Cancellation Note  Patient Details Name: Marissa Parsons MRN: [id number] DOB: 06/05/1929   Cancelled Treatment:    Reason Eval/Treat Not Completed: Fatigue/lethargy limiting ability to participate. Family NOT wanting therapy to wake pt at this time for evaluation. Pt's brother very upset at whole situation.  Earlie Raveling OTR/L [phone] 05/01/2014, 5:14 PM

## 2014-05-01 NOTE — Evaluation (Signed)
Clinical/Bedside Swallow Evaluation Patient Details  Name: Marissa Parsons MRN: [id number] Date of Birth: 1929-05-30  Today's Date: 05/01/2014 Time: 7564-3329 SLP Time Calculation (min): 21 min  Past Medical History:  Past Medical History  Diagnosis Date  . Hypertension   . Rheumatoid arteritis   . Phlebitis   . Deep vein thrombophlebitis of leg   . Fractured pelvis     fall 2011  . Thyroid disease     hypothyroidism  . Humerus fracture   . Thrombocytopenia   . Leukocytosis   . Hypothyroidism   . Phlebitis      Recurrent phlebitis  . Hematoma      Right superior and inferior pubic rami fractures with hematoma adjacent to the right ramus fracture  . Urinary tract infection   . Humerus fracture      Right proximal humerus fracture  . Osteoarthritis     for which she takes chronic prednisone  . Macular degeneration   . Chronic anticoagulation   . Fracture of multiple pubic rami 01/16/2013  . Dementia     MILD   Past Surgical History:  Past Surgical History  Procedure Laterality Date  . Left hip open reduction    . Cholecystectomy    . Abdominal hysterectomy  s  . Sinus surgery with instatrak    . Lumbar laminectomy/decompression microdiscectomy N/A 03/06/2014    Procedure: LUMBAR LAMINECTOMY/DECOMPRESSION LUMBAR THREE, FOUR, AND FIVE ;  Surgeon: Tia Alert, MD;  Location: [facility];  Service: Neurosurgery;  Laterality: N/A;  LUMBAR LAMINECTOMY/DECOMPRESSION LUMBAR THREE, FOUR, AND FIVE   . Lumbar wound debridement N/A 03/21/2014    Procedure: Incision and drainage of lumbar wound;  Surgeon: Tia Alert, MD;  Location: [facility];  Service: Neurosurgery;  Laterality: N/A;   HPI:  80 y.o. female who is unable to provide history due to being both expressive and receptively aphasic. Per notes, patient sustained a fall back in July in which she had a small left sided SDH with minimal mass effect. Patient returned to ED on 8/3 due to family member noting she was slower  to respond, that her memory was not normal, that she was not able to remember names of people she knew well. On arrival to ED initial CT showed a improved SDH; MRI - Persistent left subdural hematoma, widespread in the left hemisphere. MRI appearance today is similar to the CT appearance.  Pt with CXR concerning for potential aspiration - bedside swallow eval ordered and pt made NPO.    Assessment / Plan / Recommendation Clinical Impression  Pt presents with a mild-mod oral dysphagia marked by inattention to bolus, reduced mastication with retention of bolus material in right lateral sulcus.  Liquids were consumed without apparent difficulty; there were no overt s/s of aspiration.  Pt did have a baseline cough that did not correlate with PO consumption.  Recommend dysphagia 1 diet with thin liquids for now: SLP will follow for toleration/remediation.  Pt has difficulty following commands, poor awareness, and will need fulll supervision with meals to ensure safety.      Aspiration Risk  Mild    Diet Recommendation Dysphagia 2 (Fine chop);Thin liquid   Liquid Administration via: Cup;Straw Medication Administration: Whole meds with puree Supervision: Full supervision/cueing for compensatory strategies Compensations: Small sips/bites;Check for pocketing Postural Changes and/or Swallow Maneuvers: Seated upright 90 degrees    Other  Recommendations Oral Care Recommendations: Oral care BID   Follow Up Recommendations  Frequency and Duration min 3x week  2 weeks       SLP Swallow Goals     Swallow Study Prior Functional Status       General Date of Onset: 04/29/14 HPI: 80 y.o. female who is unable to provide history due to being both expressive and receptively aphasic. Per notes, patient sustained a fall back in July in which she had a small left sided SDH with minimal mass effect. Patient returned to ED on 8/3 due to family member noting she was slower to respond, that her memory was  not normal, that she was not able to remember names of people she knew well. On arrival to ED initial CT showed a improved SDH; MRI - Persistent left subdural hematoma, widespread in the left hemisphere. MRI appearance today is similar to the CT appearance.  Pt with CXR concerning for potential aspiration - bedside swallow eval ordered and pt made NPO.  Type of Study: Bedside swallow evaluation Previous Swallow Assessment: none per recods Diet Prior to this Study: NPO Temperature Spikes Noted: Yes Respiratory Status: Nasal cannula History of Recent Intubation: No Behavior/Cognition: Alert Oral Cavity - Dentition: Dentures, top;Dentures, bottom Self-Feeding Abilities: Needs assist Patient Positioning: Upright in bed Baseline Vocal Quality: Low vocal intensity Volitional Cough: Strong Volitional Swallow: Unable to elicit    Oral/Motor/Sensory Function Overall Oral Motor/Sensory Function:  (+ symmetry, unable to follow commands)   Ice Chips Ice chips: Not tested   Thin Liquid Thin Liquid: Within functional limits Presentation: Cup;Straw    Nectar Thick Nectar Thick Liquid: Not tested   Honey Thick Honey Thick Liquid: Not tested   Puree Puree: Impaired Presentation: Spoon Oral Phase Impairments: Reduced lingual movement/coordination;Poor awareness of bolus Oral Phase Functional Implications: Right lateral sulci pocketing;Oral residue   Solid      Solid: Impaired Oral Phase Impairments: Reduced lingual movement/coordination;Poor awareness of bolus;Impaired mastication Oral Phase Functional Implications: Oral residue;Right lateral sulci pocketing      Marissa Parsons L. Marissa Parsons, [place] CCC/SLP Pager [phone]  Marissa Parsons Marissa Parsons 05/01/2014,1:21 PM

## 2014-05-01 NOTE — Progress Notes (Signed)
Patient ID: Marissa Parsons, female   DOB: [birth year], 80 y.o.   MRN: [id number] Patient looks very good at present. She is awake and alert. She is following commands. She named objects and repeats easily. She denies headache. She has no pronator drift. She moves all extremities equally. Her incision looks good. I have discussed her case with the neurologist. At this point I would simply continue the antiepileptic. I think this is likely simply from cortical irritation. Risks of surgery outweigh the potential benefits at this point. I would repeat imaging in about a week or she has any change in mental status.

## 2014-05-01 NOTE — Consult Note (Signed)
Physical Medicine and Rehabilitation Consult Reason for Consult: SDH Referring Physician: [facility]   HPI: Marissa Parsons is a 80 y.o. right-handed female with history of hypertension, rheumatoid arthritis and recent lumbar decompressive laminectomy June of 2015 per Dr. Sherley Bounds complicated by wound infection(MRSA paraspinal lumbar region) requiring intravenous antibiotics and followed by infectious disease. Presented 04/29/2014 with altered mental status/aphasia and weakness as well as reports of a fall a few days prior. Cranial CT scan showed a left subdural hematoma 3 mm residual midline shift left to right. MRI of the brain 04/30/2014 persistent left subdural hematoma widespread in the left hemisphere. Neurosurgery Dr. Cyndy Freeze consulted advise conservative care. Maintained on Vimpat for seizure prophylaxis. EEG with no seizure activity. Patient currently maintained on Rocephin after recent back wound infection to be completed 05/03/2014 and then to begin Keflex 500 mg 3 times a day x4 weeks. Physical therapy evaluation completed 04/30/2014 with recommendations for physical medicine rehabilitation consult.   Review of Systems  Unable to perform ROS: language   Past Medical History  Diagnosis Date  . Hypertension   . Rheumatoid arteritis   . Phlebitis   . Deep vein thrombophlebitis of leg   . Fractured pelvis     fall 2011  . Thyroid disease     hypothyroidism  . Humerus fracture   . Thrombocytopenia   . Leukocytosis   . Hypothyroidism   . Phlebitis      Recurrent phlebitis  . Hematoma      Right superior and inferior pubic rami fractures with hematoma adjacent to the right ramus fracture  . Urinary tract infection   . Humerus fracture      Right proximal humerus fracture  . Osteoarthritis     for which she takes chronic prednisone  . Macular degeneration   . Chronic anticoagulation   . Fracture of multiple pubic rami 01/16/2013  . Dementia     MILD   Past Surgical  History  Procedure Laterality Date  . Left hip open reduction    . Cholecystectomy    . Abdominal hysterectomy  s  . Sinus surgery with instatrak    . Lumbar laminectomy/decompression microdiscectomy N/A 03/06/2014    Procedure: LUMBAR LAMINECTOMY/DECOMPRESSION LUMBAR THREE, FOUR, AND FIVE ;  Surgeon: Eustace Moore, MD;  Location: [facility];  Service: Neurosurgery;  Laterality: N/A;  LUMBAR LAMINECTOMY/DECOMPRESSION LUMBAR THREE, FOUR, AND FIVE   . Lumbar wound debridement N/A 03/21/2014    Procedure: Incision and drainage of lumbar wound;  Surgeon: Eustace Moore, MD;  Location: [facility];  Service: Neurosurgery;  Laterality: N/A;   Family History  Problem Relation Age of Onset  . Abnormal EKG Neg Hx   . Abnormal newborn screen Neg Hx   . Achalasia Neg Hx   . Achondroplasia Neg Hx   . Acne Neg Hx   . Acromegaly Neg Hx   . Actinic keratosis Neg Hx   . Acute lymphoblastic leukemia Neg Hx   . Addison's disease Neg Hx   . Adrenal disorder Neg Hx   . Albinism Neg Hx   . Alcohol abuse Neg Hx   . Alkaptonuria Neg Hx   . Allergic rhinitis Neg Hx   . Allergies Neg Hx   . Allergy (severe) Neg Hx   . Alopecia Neg Hx   . Alpha-1 antitrypsin deficiency Neg Hx   . Alport syndrome Neg Hx   . ALS Neg Hx   . Alzheimer's disease Neg Hx   .  Ambiguous genitalia Neg Hx   . Amblyopia Neg Hx   . Amenorrhea Neg Hx   . Anal fissures Neg Hx   . Anemia Neg Hx   . Anesthesia problems Neg Hx   . Aneurysm Neg Hx   . Angelman syndrome Neg Hx   . Angina Neg Hx   . Angioedema Neg Hx   . Ankylosing spondylitis Neg Hx   . Anorectal malformation Neg Hx   . Anorexia nervosa Neg Hx   . Anti-cardiolipin syndrome Neg Hx   . Antithrombin III deficiency Neg Hx   . Anuerysm Neg Hx   . Anxiety disorder Neg Hx   . Aortic aneurysm Neg Hx   . Aortic dissection Neg Hx   . Aortic stenosis Neg Hx   . Aphthous stomatitis Neg Hx   . Aplastic anemia Neg Hx   . Appendicitis Neg Hx   . Apraxia Neg Hx   .  Arnold-Chiari malformation Neg Hx   . Arrhythmia Neg Hx   . Arthritis Neg Hx   . Asperger's syndrome Neg Hx   . Asthma Neg Hx   . Ataxia Neg Hx   . Ataxia telangiectasia Neg Hx   . Atopy Neg Hx   . Atrial fibrillation Neg Hx   . Atrophic kidney Neg Hx   . Auditory processing disorder Neg Hx   . Autism Neg Hx   . Autism spectrum disorder Neg Hx   . Autoimmune disease Neg Hx   . AVM Neg Hx   . Baker's cyst Neg Hx   . Barrett's esophagus Neg Hx   . Bartter's syndrome Neg Hx   . Basal cell carcinoma Neg Hx   . Behavior problems Neg Hx   . Bell's palsy Neg Hx   . Benign prostatic hyperplasia Neg Hx   . Bipolar disorder Neg Hx   . Birth defects Neg Hx   . Birth marks Neg Hx   . Blindness Neg Hx   . Bone cancer Neg Hx   . BOR syndrome Neg Hx   . Bow legs Neg Hx   . Bradycardia Neg Hx   . Brain cancer Neg Hx   . BRCA 1/2 Neg Hx   . Breast cancer Neg Hx   . Broken bones Neg Hx   . Bronchiolitis Neg Hx   . Bronchopulmonary dysplasia Neg Hx   . Bruton's disease Neg Hx   . Bulemia Neg Hx   . Bullous pemphigoid Neg Hx   . Bunion Neg Hx   . Bursitis Neg Hx   . Caf-au-lait spots Neg Hx   . Calcium disorder Neg Hx   . Canavan disease Neg Hx   . Cancer Neg Hx   . Cardiomyopathy Neg Hx   . Carpal tunnel syndrome Neg Hx   . Cataracts Neg Hx   . Celiac disease Neg Hx   . Cerebral aneurysm Neg Hx   . Cerebral palsy Neg Hx   . Cervical cancer Neg Hx   . Cervical polyp Neg Hx   . Cervicitis Neg Hx   . Chalasia Neg Hx   . Chalazion Neg Hx   . Charcot-Marie-Tooth disease Neg Hx   . Chediak-Higashi syndrome Neg Hx   . Chiari malformation Neg Hx   . Childhood respiratory disease Neg Hx   . Choanal atresia Neg Hx   . Cholecystitis Neg Hx   . Cholelithiasis Neg Hx   . Cholesteatoma Neg Hx   . Chondromalacia Neg Hx   . Chorea  Neg Hx   . Chromosomal disorder Neg Hx   . Chronic bronchitis Neg Hx   . Chronic fatigue Neg Hx   . Chronic granulomatous disease Neg Hx   . Chronic  infections Neg Hx   . Cirrhosis Neg Hx   . Cleft lip Neg Hx   . Cleft palate Neg Hx   . Clotting disorder Neg Hx   . Club foot Neg Hx   . Coarctation of the aorta Neg Hx   . Colitis Neg Hx   . Collagen disease Neg Hx   . Colon cancer Neg Hx   . Colon polyps Neg Hx   . Colonic polyp Neg Hx   . Color blindness Neg Hx   . Conduct disorder Neg Hx   . Conductive hearing loss Neg Hx   . Congenital adrenal hyperplasia Neg Hx   . Congenital heart disease Neg Hx   . Conjunctivitis Neg Hx   . Consanguinity Neg Hx   . Constipation Neg Hx   . COPD Neg Hx   . Corneal abrasion Neg Hx   . Corneal ulcer Neg Hx   . Coronary aneurysm Neg Hx   . Coronary artery disease Neg Hx   . Cowden syndrome Neg Hx   . Craniosynostosis Neg Hx   . Cri-du-chat syndrome Neg Hx   . Crohn's disease Neg Hx   . Cushing syndrome Neg Hx   . Cystic fibrosis Neg Hx   . Cystic kidney disease Neg Hx   . Cystinosis Neg Hx   . Decreased libido Neg Hx   . Deep vein thrombosis Neg Hx   . Delayed menopause Neg Hx   . Delayed puberty Neg Hx   . Dementia Neg Hx   . Dental caries Neg Hx   . Depression Neg Hx   . Dermatomyositis Neg Hx   . DES usage Neg Hx   . Developmental delay Neg Hx   . Diabetes Neg Hx   . Diabetes insipidus Neg Hx   . Diabetes type I Neg Hx   . Diabetes type II Neg Hx   . Diabetic kidney disease Neg Hx   . DiGeorge syndrome Neg Hx   . Dilated cardiomyopathy Neg Hx   . Dislocations Neg Hx   . Diverticulitis Neg Hx   . Diverticulosis Neg Hx   . Down syndrome Neg Hx   . Drug abuse Neg Hx   . Dupuytren's contracture Neg Hx   . Dwarfism Neg Hx   . Dysfunctional uterine bleeding Neg Hx   . Dysmenorrhea Neg Hx   . Dyspareunia Neg Hx   . Dysphagia Neg Hx   . Dysrhythmia Neg Hx   . Dystonia Neg Hx   . Early death Neg Hx   . Early menopause Neg Hx   . Early puberty Neg Hx   . Eating disorder Neg Hx   . Eclampsia Neg Hx   . Ectodermal dysplasia Neg Hx   . Eczema Neg Hx   . Edema Neg Hx     . Edward's syndrome Neg Hx   . Ehlers-Danlos syndrome Neg Hx   . Emotional abuse Neg Hx   . Emphysema Neg Hx   . Encopresis Neg Hx   . Endocrine tumor Neg Hx   . Endocrinopathy Neg Hx   . Endometrial cancer Neg Hx   . Endometriosis Neg Hx   . Enuresis Neg Hx   . Eosinophilic granuloma Neg Hx   . Epididymitis Neg Hx   .  Erectile dysfunction Neg Hx   . Erythema nodosum Neg Hx   . Esophageal cancer Neg Hx   . Esophageal varices Neg Hx   . Esophagitis Neg Hx   . Exostosis Neg Hx   . Fabry's disease Neg Hx   . Factor IX deficiency Neg Hx   . Factor V Leiden deficiency Neg Hx   . Factor VIII deficiency Neg Hx   . Failure to thrive Neg Hx   . Fainting Neg Hx   . Familial dysautonomia Neg Hx   . Familial nephritis Neg Hx   . Familial polyposis Neg Hx   . Fanconi anemia Neg Hx   . Febrile seizures Neg Hx   . Fibrocystic breast disease Neg Hx   . Fibroids Neg Hx   . Fibromyalgia Neg Hx   . Food intolerance Neg Hx   . Fragile X syndrome Neg Hx   . Friedreich's ataxia Neg Hx   . Frontotemporal dementia Neg Hx   . Fuch's dystrophy Neg Hx   . Gait disorder Neg Hx   . Galactosemia Neg Hx   . Gallbladder disease Neg Hx   . Gaucher's disease Neg Hx   . Genodermatoses Neg Hx   . GER disease Neg Hx   . Gestational diabetes Neg Hx   . GI problems Neg Hx   . Glaucoma Neg Hx   . Glomerulonephritis Neg Hx   . Glucose-6-phos deficiency Neg Hx   . Glycogen storage disease Neg Hx   . Goiter Neg Hx   . Gonadal disorder Neg Hx   . Gout Neg Hx   . Graves' disease Neg Hx   . Growth hormone deficiency Neg Hx   . GU problems Neg Hx   . Hammer toes Neg Hx   . Hartnup's disease Neg Hx   . Hashimoto's thyroiditis Neg Hx   . Healthy Neg Hx   . Hearing loss Neg Hx   . Heart block Neg Hx   . Heart defect Neg Hx   . Heart disease Neg Hx   . Heart failure Neg Hx   . Heart murmur Neg Hx   . Hemangiomas Neg Hx   . Hematuria Neg Hx   . Hemochromatosis Neg Hx   . Hemolytic uremic syndrome  Neg Hx   . Hemophilia Neg Hx   . Henoch-Schonlein purpura Neg Hx   . Hepatitis Neg Hx   . Hepatomegaly Neg Hx   . Hereditary spherocytosis Neg Hx   . Hernia Neg Hx   . Hiatal hernia Neg Hx   . High arches Neg Hx   . Hip dysplasia Neg Hx   . Hip fracture Neg Hx   . Hirschsprung's disease Neg Hx   . Hirsutism Neg Hx   . Histiocytosis X Neg Hx   . HIV Neg Hx   . HLA-B27 positive Neg Hx   . Hodgkin's lymphoma Neg Hx   . Homocystinuria Neg Hx   . Hunter's disease Neg Hx   . Huntington's disease Neg Hx   . Hydrocele Neg Hx   . Hydrocephalus Neg Hx   . Hygroma Neg Hx   . Hypercalcemia Neg Hx   . Hypereosinophilic syndrome Neg Hx   . Hyperinsulinemia Neg Hx   . Hyperkalemia Neg Hx   . Hyperlipidemia Neg Hx   . Hypermobility Neg Hx   . Hypernasality Neg Hx   . Hyperopia Neg Hx   . Hyperparathyroidism Neg Hx   . Hypersomnolence Neg Hx   .  Hypertension Neg Hx   . Hyperthyroidism Neg Hx   . Hypertrophic cardiomyopathy Neg Hx   . Hypokalemia Neg Hx   . Hypoparathyroidism Neg Hx   . Hypoplastic kidney Neg Hx   . Hypotension Neg Hx   . Hypothyroidism Neg Hx   . Idiopathic pulmonary fibrosis Neg Hx   . Idiopathic torsion dystonia Neg Hx   . IgA nephropathy Neg Hx   . Immunodeficiency Neg Hx   . Imperforate anus Neg Hx   . Impotence Neg Hx   . Impulse control disorder Neg Hx   . Incompetent cervix Neg Hx   . Infertility Neg Hx   . Inflammatory bowel disease Neg Hx   . Inguinal hernia Neg Hx   . Insomnia Neg Hx   . Insulin resistance Neg Hx   . Interstitial cystitis Neg Hx   . Intestinal malrotation Neg Hx   . Intestinal polyp Neg Hx   . Intracerebral hemorrhage Neg Hx   . Iron deficiency Neg Hx   . Irregular heart beat Neg Hx   . Irritable bowel syndrome Neg Hx   . Jaundice Neg Hx   . Job's syndrome Neg Hx   . Joint hypermobility Neg Hx   . Juvenile idiopathic arthritis Neg Hx   . Kartagener's syndrome Neg Hx   . Kawasaki disease Neg Hx   . Keloids Neg Hx   .  Kennedy's disease Neg Hx   . Keratoconus Neg Hx   . Kidney cancer Neg Hx   . Kidney disease Neg Hx   . Kidney failure Neg Hx   . Kidney nephrosis Neg Hx   . Klinefelter's syndrome Neg Hx   . Krabbe disease Neg Hx   . Kyphosis Neg Hx   . Labyrinthitis Neg Hx   . Lactose intolerance Neg Hx   . Language disorder Neg Hx   . Lead poisoning Neg Hx   . Learning disabilities Neg Hx   . Legg-Calve-Perthes disease Neg Hx   . Lesch-Nyhan syndrome Neg Hx   . Leukemia Neg Hx   . Lichen planus Neg Hx   . Li-Fraumeni syndrome Neg Hx   . Liver cancer Neg Hx   . Liver disease Neg Hx   . Long QT syndrome Neg Hx   . Lumbar disc disease Neg Hx   . Lung cancer Neg Hx   . Lung disease Neg Hx   . Lupus Neg Hx   . Lymphoma Neg Hx   . Macrocephaly Neg Hx   . Macrosomia Neg Hx   . Macular degeneration Neg Hx   . Malignant hypertension Neg Hx   . Malignant hyperthermia Neg Hx   . Maple syrup urine disease Neg Hx   . Marfan syndrome Neg Hx   . Mastocytosis Neg Hx   . Melanoma Neg Hx   . Memory loss Neg Hx   . Meniere's disease Neg Hx   . Menstrual problems Neg Hx   . Mental illness Neg Hx   . Mental retardation Neg Hx   . Metabolic syndrome Neg Hx   . Microcephaly Neg Hx   . Migraines Neg Hx   . Milk intolerance Neg Hx   . Miscarriages / Stillbirths Neg Hx   . Mitochondrial disorder Neg Hx   . Mitral valve prolapse Neg Hx   . Motor neuron disease Neg Hx   . Movement disorder Neg Hx   . Moyamoya disease Neg Hx   . Multiple births Neg Hx   .  Multiple endocrine neoplasia Neg Hx   . Multiple fractures Neg Hx   . Multiple myeloma Neg Hx   . Multiple sclerosis Neg Hx   . Muscle cancer Neg Hx   . Muscular dystrophy Neg Hx   . Myasthenia gravis Neg Hx   . Myelodysplastic syndrome Neg Hx   . Myocarditis Neg Hx   . Myoclonus Neg Hx   . Myopathy Neg Hx   . Nail disease Neg Hx   . Narcolepsy Neg Hx   . Nephritis Neg Hx   . Nephrolithiasis Neg Hx   . Nephrotic syndrome Neg Hx   . Neural  tube defect Neg Hx   . Neurodegenerative disease Neg Hx   . Neurofibromatosis Neg Hx   . Neuromuscular disorder Neg Hx   . Neuropathy Neg Hx   . Neutropenia Neg Hx   . Nevi Neg Hx   . Niemann-Pick disease Neg Hx   . Night blindness Neg Hx   . Nocturnal enuresis Neg Hx   . Nystagmus Neg Hx   . Obesity Neg Hx   . OCD Neg Hx   . ODD Neg Hx   . Orchitis Neg Hx   . Osler-Weber-Rendu syndrome Neg Hx   . Osteoarthritis Neg Hx   . Osteochondroma Neg Hx   . Osteogenesis imperfecta Neg Hx   . Osteopenia Neg Hx   . Osteoporosis Neg Hx   . Osteosarcoma Neg Hx   . Osteosclerosis Neg Hx   . Other Neg Hx   . Otitis media Neg Hx   . Ovarian cancer Neg Hx   . Ovarian cysts Neg Hx   . Oxalosis Neg Hx   . Paget's disease of bone Neg Hx   . Pancreatic cancer Neg Hx   . Pancreatitis Neg Hx   . Panhypopituitarism Neg Hx   . Panic disorder Neg Hx   . Paranoid behavior Neg Hx   . Parasomnia Neg Hx   . Parkinsonism Neg Hx   . Patau's syndrome Neg Hx   . Pathological gambling Neg Hx   . PDD Neg Hx   . Pectus carinatum Neg Hx   . Pectus excavatum Neg Hx   . Pelvic inflammatory disease Neg Hx   . Pemphigus vulgaris Neg Hx   . Penile cancer Neg Hx   . Periodic limb movement Neg Hx   . Peripheral vascular disease Neg Hx   . Pernicious anemia Neg Hx   . Personality disorder Neg Hx   . Pes cavus Neg Hx   . Physical abuse Neg Hx   . Polo Riley sequence Neg Hx   . PKU Neg Hx   . Plantar fasciitis Neg Hx   . Pleurisy Neg Hx   . Pneumonia Neg Hx   . Polychondritis Neg Hx   . Polycystic kidney disease Neg Hx   . Polycystic ovary syndrome Neg Hx   . Polycythemia Neg Hx   . Polymyalgia rheumatica Neg Hx   . Polymyositis Neg Hx   . Pompe disease Neg Hx   . Positive PPD/TB Exposure Neg Hx   . Posterior urethral valves Neg Hx   . Post-traumatic stress disorder Neg Hx   . Prader-Willi syndrome Neg Hx   . Premature birth Neg Hx   . Premature ovarian failure Neg Hx   . Preterm labor Neg Hx     . Prolactinoma Neg Hx   . Prostate cancer Neg Hx   . Prostatitis Neg Hx   . Protein C deficiency  Neg Hx   . Protein S deficiency Neg Hx   . Proteinuria Neg Hx   . Prune belly syndrome Neg Hx   . Pruritis Neg Hx   . Pseudochol deficiency Neg Hx   . Pseudotumor cerebri Neg Hx   . Psoriasis Neg Hx   . Psychosis Neg Hx   . Ptosis Neg Hx   . Pulmonary embolism Neg Hx   . Pulmonary fibrosis Neg Hx   . Pyelonephritis Neg Hx   . Pyloric stenosis Neg Hx   . Pyruvate dehydrogenase deficiency Neg Hx   . Rashes / Skin problems Neg Hx   . Raynaud syndrome Neg Hx   . Rectal cancer Neg Hx   . Recurrent abdominal pain Neg Hx   . Reiter's syndrome Neg Hx   . Renal tubular acidosis Neg Hx   . Restless legs syndrome Neg Hx   . Retinal degeneration Neg Hx   . Retinal detachment Neg Hx   . Retinitis pigmentosa Neg Hx   . Retinoblastoma Neg Hx   . Retinopathy of prematurity Neg Hx   . Reye's syndrome Neg Hx   . Rheum arthritis Neg Hx   . Rheumatic fever Neg Hx   . Rheumatologic disease Neg Hx   . Rickets Neg Hx   . Rosacea Neg Hx   . Sacroiliitis Neg Hx   . Sarcoidosis Neg Hx   . Schizophrenia Neg Hx   . Scleritis Neg Hx   . Scleroderma Neg Hx   . Scoliosis Neg Hx   . Seasonal affective disorder Neg Hx   . Seizures Neg Hx   . Selective mutism Neg Hx   . Sensorineural hearing loss Neg Hx   . Severe combined immunodeficiency Neg Hx   . Severe sprains Neg Hx   . Sexual abuse Neg Hx   . Short stature Neg Hx   . Sick sinus syndrome Neg Hx   . Sickle cell anemia Neg Hx   . Sickle cell trait Neg Hx   . SIDS Neg Hx   . Single kidney Neg Hx   . Sinusitis Neg Hx   . Sjogren's syndrome Neg Hx   . Skeletal dysplasia Neg Hx   . Skin cancer Neg Hx   . Skin telangiectasia Neg Hx   . Sleep apnea Neg Hx   . Sleep disorder Neg Hx   . Sleep walking Neg Hx   . Snoring Neg Hx   . Social phobia Neg Hx   . Speech disorder Neg Hx   . Spina bifida Neg Hx   . Spinal muscular atrophy Neg Hx   .  Splenomegaly Neg Hx   . Spondyloarthropathy Neg Hx   . Spondylolisthesis Neg Hx   . Spondylolysis Neg Hx   . Squamous cell carcinoma Neg Hx   . Stevens-Johnson syndrome Neg Hx   . Stickler syndrome Neg Hx   . Stomach cancer Neg Hx   . Strabismus Neg Hx   . Stroke Neg Hx   . Stuttering Neg Hx   . Subarachnoid hemorrhage Neg Hx   . Sudden death Neg Hx   . Suicidality Neg Hx   . Supraventricular tachycardia Neg Hx   . Swallowing difficulties Neg Hx   . Tall stature Neg Hx   . Tay-Sachs disease Neg Hx   . Testicular cancer Neg Hx   . Thalassemia Neg Hx   . Throat cancer Neg Hx   . Thrombocytopenia Neg Hx   . Thrombophilia Neg Hx   .  Thrombophlebitis Neg Hx   . Thrombosis Neg Hx   . Thymic aplasia Neg Hx   . Thyroid cancer Neg Hx   . Thyroid disease Neg Hx   . Thyroid nodules Neg Hx   . Tics Neg Hx   . Tongue cancer Neg Hx   . Torticollis Neg Hx   . Tourette syndrome Neg Hx   . Tracheal cancer Neg Hx   . Tracheoesophageal fistual Neg Hx   . Tracheomalacia Neg Hx   . Transient ischemic attack Neg Hx   . Treacher Collins syndrome Neg Hx   . Tremor Neg Hx   . Tuberculosis Neg Hx   . Tuberous sclerosis Neg Hx   . Turner syndrome Neg Hx   . Ulcerative colitis Neg Hx   . Ulcers Neg Hx   . Undescended testes Neg Hx   . Unexplained death Neg Hx   . Urinary tract infection Neg Hx   . Urolithiasis Neg Hx   . Urticaria Neg Hx   . Uterine cancer Neg Hx   . Uveitis Neg Hx   . Vaginal cancer Neg Hx   . Valvular heart disease Neg Hx   . Vasculitis Neg Hx   . Velocardiofacial syndrome Neg Hx   . Venous thrombosis Neg Hx   . Verruca vulgaris  Neg Hx   . Vesicoureteral reflux Neg Hx   . Vision loss Neg Hx   . Vitamin D deficiency Neg Hx   . Vitiligo Neg Hx   . Voice disorder Neg Hx   . Von Gierke's disease Neg Hx   . Von Hippel-Lindau syndrome Neg Hx   . Von Willebrand disease Neg Hx   . Werdnig Hoffman Disease Neg Hx   . Williams syndrome Neg Hx   . Wilm's tumor Neg Hx     . Wilson's disease Neg Hx   . Wiskott-Aldrich syndrome Neg Hx   . Yves Dill Parkinson White syndrome Neg Hx   . Xeroderma pigmentosa Neg Hx   . Heart attack Mother   . Heart attack Father    Social History:  reports that she has quit smoking. She has never used smokeless tobacco. She reports that she does not drink alcohol or use illicit drugs. Allergies:  Allergies  Allergen Reactions  . Morphine And Related     nausea  . Tramadol Other (See Comments)    unknown  . Penicillins Rash   Medications Prior to Admission  Medication Sig Dispense Refill  . alendronate (FOSAMAX) 70 MG tablet Take 70 mg by mouth once a week. Take on Monday.Take with a full glass of water on an empty stomach.      Marland Kitchen aspirin EC 81 MG tablet Take 81 mg by mouth daily.      . bisoprolol-hydrochlorothiazide (ZIAC) 5-6.25 MG per tablet Take 1 tablet by mouth daily.      . calcium citrate-vitamin D (CITRACAL+D) 315-200 MG-UNIT per tablet Take 1 tablet by mouth daily.      . cefTRIAXone 2 g in dextrose 5 % 50 mL Inject 2 g into the vein daily.  36 each  0  . diazepam (VALIUM) 5 MG tablet Take 5 mg by mouth at bedtime as needed for anxiety or muscle spasms.       Marland Kitchen diltiazem (CARDIZEM CD) 240 MG 24 hr capsule Take 240 mg by mouth daily.      . folic acid (FOLVITE) 1 MG tablet Take 1 mg by mouth daily.      Marland Kitchen  levothyroxine (SYNTHROID, LEVOTHROID) 50 MCG tablet Take 50-100 mcg by mouth See admin instructions. Take 1 tablets (50 mcg) on Monday and Friday, take 2 tablet (100 mcg) on Sunday, Tuesday, Wednesday, Thursday, Saturday      . methotrexate (RHEUMATREX) 2.5 MG tablet Take 10 mg by mouth 2 (two) times a week. On Tuesdays and Wednesdays      . predniSONE (DELTASONE) 5 MG tablet Take 5 mg by mouth daily.        Home: Home Living Family/patient expects to be discharged to:: Inpatient rehab  Functional History: Prior Function Comments: Per chart prior to last admit she was Mod I with AD, however pt with speech  deficits and ? cognitive deificts, so difficult to determine PLOF.   Functional Status:  Mobility: Bed Mobility Overal bed mobility: Needs Assistance Bed Mobility: Rolling;Sidelying to Sit;Sit to Sidelying Rolling: Max assist Sidelying to sit: Total assist Sit to sidelying: Max assist General bed mobility comments: verbal and hand over hand cueing for technique and participation in bed mobility.  pt with minimal effort during mobility, though ? if this is in part due to cognition vs receptive deficits.          ADL:    Cognition: Cognition Overall Cognitive Status: Impaired/Different from baseline Orientation Level: Oriented to person;Oriented to situation;Disoriented to time Cognition Arousal/Alertness: Awake/alert Behavior During Therapy: Flat affect Overall Cognitive Status: Impaired/Different from baseline General Comments: Difficult to assess cognition 2/2 speech deficits.  At times pt was able to verbalize words, but inconsistently and followed directions inconsistently.  Improved direction following on L side than R, so ? neglect.    Blood pressure 146/68, pulse 103, temperature 98 F (36.7 C), temperature source Oral, resp. rate 24, height $RemoveBe'5\' 7"'[id number]$  (1.702 m), weight 59.648 kg (131 lb 8 oz), SpO2 96.00%. Physical Exam  Vitals reviewed. Constitutional:  80 year old frail female.  Eyes:  Pupils round and reactive to light without nystagmus  Neck: Normal range of motion. Neck supple. No thyromegaly present.  Cardiovascular: Normal rate and regular rhythm.   Respiratory:  Lungs decreased breath sounds at the bases  GI: Soft. Bowel sounds are normal. She exhibits no distension.  Neurological:  Patient is a bit lethargic but arousable. She has word finding deficits. Was able to communicate to me that she lives with her nephew. Appears to be apraxic also. Can't identify simple objects. Moves right arm and leg but very inconsistent.   Skin: Skin is warm and dry.  Psychiatric:  She has a normal mood and affect. Her behavior is normal.    Results for orders placed during the hospital encounter of 04/29/14 (from the past 24 hour(s))  TSH     Status: None   Collection Time    04/30/14  8:30 AM      Result Value Ref Range   TSH 1.720  0.350 - 4.500 uIU/mL  CBC     Status: Abnormal   Collection Time    04/30/14  8:30 AM      Result Value Ref Range   WBC 8.4  4.0 - 10.5 K/uL   RBC 3.64 (*) 3.87 - 5.11 MIL/uL   Hemoglobin 11.7 (*) 12.0 - 15.0 g/dL   HCT 35.9 (*) 36.0 - 46.0 %   MCV 98.6  78.0 - 100.0 fL   MCH 32.1  26.0 - 34.0 pg   MCHC 32.6  30.0 - 36.0 g/dL   RDW 13.7  11.5 - 15.5 %   Platelets 297  150 -  400 K/uL  BASIC METABOLIC PANEL     Status: Abnormal   Collection Time    04/30/14  8:30 AM      Result Value Ref Range   Sodium 137  137 - 147 mEq/L   Potassium 3.7  3.7 - 5.3 mEq/L   Chloride 99  96 - 112 mEq/L   CO2 24  19 - 32 mEq/L   Glucose, Bld 146 (*) 70 - 99 mg/dL   BUN 9  6 - 23 mg/dL   Creatinine, Ser 0.64  0.50 - 1.10 mg/dL   Calcium 9.3  8.4 - 10.5 mg/dL   GFR calc non Af Amer 80 (*) >90 mL/min   GFR calc Af Amer >90  >90 mL/min   Anion gap 14  5 - 15  CBC     Status: Abnormal   Collection Time    05/01/14  2:56 AM      Result Value Ref Range   WBC 12.3 (*) 4.0 - 10.5 K/uL   RBC 4.05  3.87 - 5.11 MIL/uL   Hemoglobin 12.8  12.0 - 15.0 g/dL   HCT 40.0  36.0 - 46.0 %   MCV 98.8  78.0 - 100.0 fL   MCH 31.6  26.0 - 34.0 pg   MCHC 32.0  30.0 - 36.0 g/dL   RDW 13.8  11.5 - 15.5 %   Platelets 314  150 - 400 K/uL  BASIC METABOLIC PANEL     Status: Abnormal   Collection Time    05/01/14  2:56 AM      Result Value Ref Range   Sodium 137  137 - 147 mEq/L   Potassium 3.9  3.7 - 5.3 mEq/L   Chloride 97  96 - 112 mEq/L   CO2 27  19 - 32 mEq/L   Glucose, Bld 101 (*) 70 - 99 mg/dL   BUN 9  6 - 23 mg/dL   Creatinine, Ser 0.67  0.50 - 1.10 mg/dL   Calcium 9.0  8.4 - 10.5 mg/dL   GFR calc non Af Amer 79 (*) >90 mL/min   GFR calc Af Amer  >90  >90 mL/min   Anion gap 13  5 - 15   Ct Head Wo Contrast  04/29/2014   CLINICAL DATA:  Altered mental status, recent urinary tract infection.  EXAM: CT HEAD WITHOUT CONTRAST  TECHNIQUE: Contiguous axial images were obtained from the base of the skull through the vertex without intravenous contrast.  COMPARISON:  CT head April 18, 2014  FINDINGS: Degenerating left holohemispheric subdural hematoma previously up to 8.4 mm, now 8 mm, without Re bleed. 3 mm left-to-right midline shift, similar.  The ventricles and sulci are overall normal for age. No intraparenchymal hemorrhage, mass effect nor midline shift. Confluent supratentorial white matter hypodensities again noted. No acute large vascular territory infarcts. Remote left cerebellar infarcts.  Basal cisterns are patent. Moderate calcific atherosclerosis of the carotid siphons.  No skull fracture. The included ocular globes and orbital contents are non-suspicious. Status post bilateral ocular lens implants. Partially imaged left maxillary sinus bony wall thickening mucosal thickening, consistent with chronic sinusitis. No paranasal sinus air-fluid levels. Mastoid air cells are well aerated.  IMPRESSION: Degenerating left holohemispheric contracting subdural hematoma, 3 mm residual left-to-right midline shift without rebleed.  Involutional changes. Severe white matter changes suggest chronic small vessel ischemic disease with remote is left cerebellar infarct.  Findings discussed with and reconfirmed by CHRISTOPHER LAWYER on8/3/2015at6:15 pm.   Electronically Signed  By: Elon Alas   On: 04/29/2014 18:18   Mr Brain Wo Contrast  04/30/2014   CLINICAL DATA:  80 year old female status post spine surgery complicated by infection. Progressive confusion and weakness. Initial encounter. Subdural hematoma discovered in July.  EXAM: MRI HEAD WITHOUT CONTRAST  TECHNIQUE: Multiplanar, multiecho pulse sequences of the brain and surrounding structures were  obtained without intravenous contrast.  COMPARISON:  Head CTs 04/29/2014 and earlier.  FINDINGS: Study is intermittently degraded by motion artifact despite repeated imaging attempts.  Broad-based left side subdural hematoma measures up to 7-8 mm in thickness, appearance on MRI similar to the CT appearance 04/18/2014. Mild rightward midline shift of up to 5 mm persists (4 mm at a comparable level on 04/18/2014).  No superimposed restricted diffusion or evidence of acute infarction. No ventriculomegaly or intraventricular hemorrhage. No other intracranial mass lesion identified. Patent basilar cisterns. Negative pituitary, cervicomedullary junction and visualized cervical spine. Patchy and confluent cerebral white matter T2 and FLAIR hyperintensity. Moderate T2 heterogeneity in the deep gray matter nuclei and brainstem. Occasional small chronic lacunar infarcts in the left cerebellar hemisphere. Visible internal auditory structures appear normal. Major intracranial vascular flow voids are preserved.  Mastoids are clear. Stable paranasal sinuses, chronic mucoperiosteal thickening of the left maxillary. Postoperative changes to the globes. Visualized scalp soft tissues are within normal limits.  IMPRESSION: 1. Persistent left subdural hematoma, widespread in the left hemisphere. MRI appearance today is similar to the CT appearance on 04/18/2014, with blood products measuring up to 8 mm and 5 mm of rightward midline shift. 2. No new intracranial abnormality identified. 3. Moderate signal changes in the brain compatible with chronic small vessel disease.   Electronically Signed   By: Lars Pinks [name]   On: 04/30/2014 12:31   Dg Chest Port 1 View  05/01/2014   CLINICAL DATA:  Possible aspiration.  EXAM: PORTABLE CHEST - 1 VIEW  COMPARISON:  04/29/2014  FINDINGS: Right upper extremity PICC, tip at the level of the SVC.  No cardiomegaly. Mediastinal contours are distorted by rightward rotation.  Mild, new interstitial  coarsening asymmetric at the right base.  Remote right humeral neck fracture.  IMPRESSION: Suspect mild pneumonitis in the right lower lobe.   Electronically Signed   By: Jorje Guild [name]   On: 05/01/2014 01:29   Dg Chest Port 1 View  04/29/2014   CLINICAL DATA:  Altered mental status  EXAM: PORTABLE CHEST - 1 VIEW  COMPARISON:  03/24/2014  FINDINGS: Right arm PICC tip in the mid SVC.  Improved aeration in the lung bases since the prior study. Small right effusion and minimal right lower lobe atelectasis have improved. Negative for heart failure.  IMPRESSION: Improved aeration in the lung bases. Small right effusion and mild right lower lobe atelectasis persist.   Electronically Signed   By: Franchot Gallo [name]   On: 04/29/2014 19:01    Assessment/Plan: Diagnosis: left sided SDH after fall 1. Does the need for close, 24 hr/day medical supervision in concert with the patient's rehab needs make it unreasonable for this patient to be served in a less intensive setting? Yes 2. Co-Morbidities requiring supervision/potential complications: htn 3. Due to bladder management, bowel management, safety, skin/wound care, disease management, medication administration, pain management and patient education, does the patient require 24 hr/day rehab nursing? Yes 4. Does the patient require coordinated care of a physician, rehab nurse, PT (1-2 hrs/day, 5 days/week), OT (1-2 hrs/day, 5 days/week) and SLP (1-2 hrs/day, 5 days/week) to address  physical and functional deficits in the context of the above medical diagnosis(es)? Yes Addressing deficits in the following areas: balance, endurance, locomotion, strength, transferring, bowel/bladder control, bathing, dressing, feeding, grooming, toileting, cognition, speech, language, swallowing and psychosocial support 5. Can the patient actively participate in an intensive therapy program of at least 3 hrs of therapy per day at least 5 days per week? Potentially 6. The  potential for patient to make measurable gains while on inpatient rehab is good 7. Anticipated functional outcomes upon discharge from inpatient rehab are supervision and min assist  with PT, supervision and min assist with OT, min assist and mod assist with SLP. 8. Estimated rehab length of stay to reach the above functional goals is: 18-25 days 9. Does the patient have adequate social supports to accommodate these discharge functional goals? Potentially 10. Anticipated D/C setting: Home 11. Anticipated post D/C treatments: HH therapy and Outpatient therapy 12. Overall Rehab/Functional Prognosis: good  RECOMMENDATIONS: This patient's condition is appropriate for continued rehabilitative care in the following setting: CIR Patient has agreed to participate in recommended program. N/A Note that insurance prior authorization may be required for reimbursement for recommended care.  Comment: Will need assist at home. Rehab Admissions Coordinator to follow up.  Thanks,  Meredith Staggers, MD, Mellody Drown     05/01/2014

## 2014-05-01 NOTE — Progress Notes (Addendum)
Physician Daily Progress Note  Subjective: Low grade fever  Increased O2 requirement  CXR w/ possible aspiration pneumonitis  Still altered , unable to converse     Objective: Vital signs in last 24 hours:   Filed Vitals:   05/01/14 0011 05/01/14 0053 05/01/14 0303 05/01/14 0747  BP: 132/59  146/68 120/52  Pulse: 100  103 97  Temp: 99.3 F (37.4 C)  98 F (36.7 C) 100 F (37.8 C)  TempSrc: Oral  Oral Axillary  Resp: 29  24 24   Height:      Weight:      SpO2: 96% 97% 96% 36%   Weight change:  Last BM Date: 04/29/14  CBG (last 3)  No results found for this basename: GLUCAP,  in the last 72 hours  Intake/Output from previous day:  Intake/Output Summary (Last 24 hours) at 05/01/14 0852 Last data filed at 05/01/14 0800  Gross per 24 hour  Intake    800 ml  Output    810 ml  Net    -10 ml      Physical Exam General appearance:WF in NAD  HEENT:  MMM, no icterus, EOMI Resp:  CTAB, no crackels  Cardio:  RRR, no MRG  GI: soft, non-tender; bowel sounds normal; no masses,  no organomegaly Extremities: no clubbing, cyanosis or edema Neuro : CN II-XII intact grossly  Skin : 3cm purple , nondraining scar present peri spinally w/o any sign of fluctuance   Labs: Basic Metabolic Panel:  Recent Labs Lab 04/29/14 1731 04/30/14 0830 05/01/14 0256  NA 133* 137 137  K 3.9 3.7 3.9  CL 98 99 97  CO2 24 24 27   GLUCOSE 120* 146* 101*  BUN 14 9 9   CREATININE 0.60 0.64 0.67  CALCIUM 9.6 9.3 9.0   GFR Estimated Creatinine Clearance: 50.1 ml/min (by C-G formula based on Cr of 0.67). Liver Function Tests:  Recent Labs Lab 04/29/14 1731  AST 64*  ALT 27  ALKPHOS 78  BILITOT 0.2*  PROT 6.1  ALBUMIN 2.7*   No results found for this basename: LIPASE, AMYLASE,  in the last 168 hours No results found for this basename: AMMONIA,  in the last 168 hours Coagulation profile No results found for this basename: INR, PROTIME,  in the last 168 hours  CBC:  Recent  Labs Lab 04/29/14 1731 04/30/14 0830 05/01/14 0256  WBC 8.6 8.4 12.3*  NEUTROABS 6.1  --   --   HGB 11.3* 11.7* 12.8  HCT 35.7* 35.9* 40.0  MCV 98.9 98.6 98.8  PLT 288 297 314   Cardiac Enzymes: No results found for this basename: CKTOTAL, CKMB, CKMBINDEX, TROPONINI,  in the last 168 hours BNP (last 3 results)  Recent Labs  03/24/14 0430  PROBNP 2706.0*   CBG: No results found for this basename: GLUCAP,  in the last 168 hours D-Dimer: No results found for this basename: DDIMER,  in the last 72 hours Hgb A1c: No results found for this basename: HGBA1C,  in the last 72 hours Lipid Profile: No results found for this basename: CHOL, HDL, LDLCALC, TRIG, CHOLHDL, LDLDIRECT,  in the last 72 hours Thyroid function studies:  Recent Labs  04/30/14 0830  TSH 1.720   Anemia work up: No results found for this basename: VITAMINB12, FOLATE, FERRITIN, TIBC, IRON, RETICCTPCT,  in the last 72 hours Sepsis Labs:  Recent Labs Lab 04/29/14 1731 04/29/14 1745 04/30/14 0830 05/01/14 0256  WBC 8.6  --  8.4 12.3*  LATICACIDVEN  --  1.45  --   --    Microbiology Recent Results (from the past 240 hour(s))  CULTURE, BLOOD (ROUTINE X 2)     Status: None   Collection Time    04/29/14  5:31 PM      Result Value Ref Range Status   Specimen Description BLOOD PICC LINE   Final   Special Requests BOTTLES DRAWN AEROBIC AND ANAEROBIC R5CC B 8CC   Final   Culture  Setup Time     Final   Value: 04/29/2014 22:16     Performed at [facility]   Culture     Final   Value:        BLOOD CULTURE RECEIVED NO GROWTH TO DATE CULTURE WILL BE HELD FOR 5 DAYS BEFORE ISSUING A FINAL NEGATIVE REPORT     Performed at [facility]   Report Status PENDING   Incomplete  CULTURE, BLOOD (ROUTINE X 2)     Status: None   Collection Time    04/29/14  5:38 PM      Result Value Ref Range Status   Specimen Description BLOOD LEFT ARM   Final   Special Requests BOTTLES DRAWN AEROBIC AND ANAEROBIC  6CC   Final   Culture  Setup Time     Final   Value: 04/29/2014 22:17     Performed at [facility]   Culture     Final   Value:        BLOOD CULTURE RECEIVED NO GROWTH TO DATE CULTURE WILL BE HELD FOR 5 DAYS BEFORE ISSUING A FINAL NEGATIVE REPORT     Performed at [facility]   Report Status PENDING   Incomplete  URINE CULTURE     Status: None   Collection Time    04/29/14  5:50 PM      Result Value Ref Range Status   Specimen Description URINE, CATHETERIZED   Final   Special Requests NONE   Final   Culture  Setup Time     Final   Value: 04/29/2014 18:17     Performed at [facility] Count     Final   Value: NO GROWTH     Performed at [facility]   Culture     Final   Value: NO GROWTH     Performed at [facility]   Report Status 04/30/2014 FINAL   Final  MRSA PCR SCREENING     Status: None   Collection Time    04/29/14 10:45 PM      Result Value Ref Range Status   MRSA by PCR NEGATIVE  NEGATIVE Final   Comment:            The GeneXpert MRSA Assay (FDA     approved for NASAL specimens     only), is one component of a     comprehensive MRSA colonization     surveillance program. It is not     intended to diagnose MRSA     infection nor to guide or     monitor treatment for     MRSA infections.     . bisoprolol-hydrochlorothiazide  1 tablet Oral Daily  . cefTRIAXone (ROCEPHIN) IVPB 2 gram/50 mL D5W (Pyxis)  2 g Intravenous Q24H  . diltiazem  240 mg Oral Daily  . folic acid  1 mg Oral Daily  . lacosamide  50 mg Oral BID  . levothyroxine  100 mcg Oral Once per  day on Sun Tue Wed Thu Sat  . [START ON 05/03/2014] levothyroxine  50 mcg Oral Once per day on Mon Fri  . methotrexate  10 mg Oral Once per day on Tue Wed  . predniSONE  5 mg Oral Q breakfast  . saccharomyces boulardii  250 mg Oral BID   Continuous Infusions: . sodium chloride 10 mL/hr at 04/30/14 1800     Assessment/Plan:   Aphasia - has 62mm improving SDH  that NSG  is   following,  recontact them and request assistance in the care of this patient. MRI r/o'd new CVA . Started on AED b/c of concern may be related to seizure activity 2/2 brain irritation from the bleed. Continue to follow neuro recs, which I greatly appreciate. Speech/PT/OT/SW consulted . CIR consult for dispo.   Prior MRSA infection of perispinal lumbar region - on rocephin q24hr via her PICC . Per NSG notes , this needs to be through Friday. Then transition to keflex 500 TID x 4 weeks if stable . No active drainage from sight today. If decompensates further, consider expanding coverage to  vanc   Leukocytosis / low grade fever - panculturing . Ensuring NSG f/u today to comment on progress of back wound   Aspiration - NPO and speech consult   Hypothyroid - controlled   PPx - SCDs   Dispo - PT/OT/SW/CIR ordered   Spoke w/ brother who is next of kin and she does not have formal HCPOA. He states he is her only next of kin that she actively talks w/. He states she would want to be DNR / DNI and this order will be placed.     LOS: 2 days   Grove Defina 05/01/2014, 8:52 AM

## 2014-05-01 NOTE — Evaluation (Signed)
Speech Language Pathology Evaluation Patient Details Name: TANZIE LASLIE MRN: [id number] DOB: 06-07-1929 Today's Date: 05/01/2014 Time: 3016-0109 SLP Time Calculation (min): 13 min  Problem List:  Patient Active Problem List   Diagnosis Date Noted  . Aphasia 04/30/2014  . Subdural hematoma 04/29/2014  . UTI (lower urinary tract infection) 03/21/2014  . Fever 03/21/2014  . Leukocytosis 03/21/2014  . Hypoxia 03/21/2014  . Status post lumbar laminectomy 03/21/2014  . Wound infection after surgery 03/21/2014  . S/P lumbar laminectomy 03/06/2014  . Lumbar spinal stenosis 03/03/2014  . Fracture of multiple pubic rami 01/16/2013    Class: Acute  . PAC (premature atrial contraction) 08/07/2012  . Hypokalemia 07/27/2012  . Neutrophilic leukocytosis 07/27/2012  . Chronic anticoagulation   . Hypertension   . Pericarditis, acute 07/26/2012  . Weight loss, unintentional 04/04/2012  . Hypothyroidism 01/05/2011  . Osteoporosis 01/05/2011  . Macular degeneration 01/05/2011  . Deep vein thrombosis (DVT) 01/05/2011  . Benign hypertensive heart disease without heart failure 01/05/2011  . Osteoarthritis 01/05/2011   Past Medical History:  Past Medical History  Diagnosis Date  . Hypertension   . Rheumatoid arteritis   . Phlebitis   . Deep vein thrombophlebitis of leg   . Fractured pelvis     fall 2011  . Thyroid disease     hypothyroidism  . Humerus fracture   . Thrombocytopenia   . Leukocytosis   . Hypothyroidism   . Phlebitis      Recurrent phlebitis  . Hematoma      Right superior and inferior pubic rami fractures with hematoma adjacent to the right ramus fracture  . Urinary tract infection   . Humerus fracture      Right proximal humerus fracture  . Osteoarthritis     for which she takes chronic prednisone  . Macular degeneration   . Chronic anticoagulation   . Fracture of multiple pubic rami 01/16/2013  . Dementia     MILD   Past Surgical History:  Past Surgical  History  Procedure Laterality Date  . Left hip open reduction    . Cholecystectomy    . Abdominal hysterectomy  s  . Sinus surgery with instatrak    . Lumbar laminectomy/decompression microdiscectomy N/A 03/06/2014    Procedure: LUMBAR LAMINECTOMY/DECOMPRESSION LUMBAR THREE, FOUR, AND FIVE ;  Surgeon: Tia Alert, MD;  Location: [facility];  Service: Neurosurgery;  Laterality: N/A;  LUMBAR LAMINECTOMY/DECOMPRESSION LUMBAR THREE, FOUR, AND FIVE   . Lumbar wound debridement N/A 03/21/2014    Procedure: Incision and drainage of lumbar wound;  Surgeon: Tia Alert, MD;  Location: [facility];  Service: Neurosurgery;  Laterality: N/A;   HPI:  80 y.o. female who is unable to provide history due to being both expressive and receptively aphasic. Per notes, patient sustained a fall back in July in which she had a small left sided SDH with minimal mass effect. Patient returned to ED on 8/3 due to family member noting she was slower to respond, that her memory was not normal, that she was not able to remember names of people she knew well. On arrival to ED initial CT showed a improved SDH; MRI - Persistent left subdural hematoma, widespread in the left hemisphere. MRI appearance today is similar to the CT appearance.    Assessment / Plan / Recommendation Clinical Impression  Pt presents with a moderate aphasia marked by adequate participation in predictable, social language, but breakdown when expressing novel, propositional speech.  Expression is dysfluent,  c/b production of semantic paraphasias and perseverations, with self-repetitions in an effort to transition herself through phrase.  Pt has difficulty following commands or discriminating among objects in room.  She demonstrates awareness of deficits and frustration with difficulty communicating.  Recommend SLP f/u to address language deficits; rec CIR.      SLP Assessment  Patient needs continued Speech Lanaguage Pathology Services    Follow Up  Recommendations  Inpatient Rehab    Frequency and Duration min 3x week  2 weeks      SLP Goals  SLP Goals Potential to Achieve Goals: Fair Progress/Goals/Alternative treatment plan discussed with pt/caregiver and they: Agree  SLP Evaluation Prior Functioning  Cognitive/Linguistic Baseline: Information not available Type of Home: House Available Help at Discharge: Family;Available PRN/intermittently;Friend(s)   Cognition  Overall Cognitive Status: Impaired/Different from baseline Arousal/Alertness: Awake/alert Orientation Level: Oriented to person Attention: Focused Focused Attention: Appears intact Problem Solving: Impaired    Comprehension  Auditory Comprehension Overall Auditory Comprehension: Impaired Yes/No Questions: Impaired Basic Biographical Questions: 26-50% accurate Commands: Impaired One Step Basic Commands: 50-74% accurate Conversation: Simple Interfering Components: Attention Visual Recognition/Discrimination Discrimination: Exceptions to [facility] Common Objects: Able in field of 2 (with mod assist) Reading Comprehension Reading Status: Not tested    Expression Expression Primary Mode of Expression: Verbal Verbal Expression Overall Verbal Expression: Impaired Initiation: Impaired Automatic Speech: Social Response;Counting Level of Generative/Spontaneous Verbalization: Phrase Repetition: Impaired Level of Impairment: Phrase level Naming: Impairment Responsive: 0-25% accurate Confrontation: Impaired Common Objects: Able in field of 2 (with mod assist) Convergent: 25-49% accurate Divergent: Not tested Verbal Errors: Semantic paraphasias;Neologisms;Perseveration Written Expression Dominant Hand: Right Written Expression: Not tested   Oral / Motor Oral Motor/Sensory Function Overall Oral Motor/Sensory Function: Appears within functional limits for tasks assessed (symmetry intact) Motor Speech Overall Motor Speech: Appears within functional limits for  tasks assessed   Larue Drawdy L. Samson Frederic, [place] CCC/SLP Pager [phone]      Blenda Mounts Laurice 05/01/2014, 1:36 PM

## 2014-05-01 NOTE — Progress Notes (Signed)
Subjective: Per nursing seems somewhat improved  When I entered the room, she is able to tell me her name and states "your hands are cold." She is able to have a reasonably normal conversation, but does make errors at times.  Exam: Filed Vitals:   05/01/14 1100  BP: 131/59  Pulse: 94  Temp: 98 F (36.7 C)  Resp: 17   Gen: In bed, NAD MS: Awake, alert, oriented to person. She appears to have word finding difficulty and is unable to get the words Worden out. She does however have frequent fluent speech and is able to follow simple commands, and some or competent commands. She has some difficulty with repetition. CN: Pupils equal round and reactive to light, extraocular movements intact, visual fields full Motor: Moves all extremities well, with possible exception of a mild right leg weakness Sensory: Intact to light touch   Impression: 80 year old female with subdural hematoma and aphasia that seems to be improving. EEG showed cortical irritability, and I favor continuing antiepileptics for the time being. Aphasia is likely secondary to cortical irritation from blood.  Recommendations: 1) continue vimpat at 100 mg twice a day 2) will continue to follow  Marissa Slot, MD [facility] [phone]  If 7pm- 7am, please page neurology on call as listed in AMION.

## 2014-05-01 NOTE — Progress Notes (Signed)
[facility]  Patient Status: Active (receiving services up to time of hospitalization)  [facility] is providing the following services: RN and Home Infusion Services (teaching and education will be done by nurse in the home with patient and caregiver)  If patient discharges after hours, please call [phone].   Marissa Parsons 05/01/2014, 10:32 AM

## 2014-05-01 NOTE — Progress Notes (Signed)
Physical Therapy Treatment Patient Details Name: Marissa Parsons MRN: [id number] DOB: 1929/01/25 Today's Date: 05/01/2014    History of Present Illness pt presents with falls, memory deficits, speech deficits, visual deficits and recent L3-5 Lami.  pt being worked up to r/o CVA.      PT Comments    Pt more verbal and able to participate more during today's session. 2 person (A) for safety with transfers and gt. Pt able to ambulate around bed today. Cont to recommend CIR for post acute rehab.   Follow Up Recommendations  CIR     Equipment Recommendations  Other (comment) (TBD)    Recommendations for Other Services Rehab consult;Speech consult     Precautions / Restrictions Precautions Precautions: Fall;Back Precaution Booklet Issued: No Precaution Comments: pt with recent L3-5 Lami.   Restrictions Weight Bearing Restrictions: No    Mobility  Bed Mobility Overal bed mobility: Needs Assistance Bed Mobility: Rolling;Sidelying to Sit;Sit to Sidelying Rolling: Min assist Sidelying to sit: Mod assist     Sit to sidelying: Mod assist General bed mobility comments: (A) to elevate trunk to sitting position and maintain back precautions; (A) to bring LEs into sidelying position due to weakness; max cues for sequencing   Transfers Overall transfer level: Needs assistance Equipment used: 2 person hand held assist Transfers: Sit to/from Stand Sit to Stand: +2 physical assistance;Mod assist         General transfer comment: 2 person (A) for safety; cues for upright posture; pt with posterior lean/thrust; tactile cues through pelivs for upright posture and balance  Ambulation/Gait Ambulation/Gait assistance: +2 physical assistance;Mod assist Ambulation Distance (Feet): 12 Feet Assistive device: 2 person hand held assist Gait Pattern/deviations: Decreased stride length;Step-through pattern;Trunk flexed;Narrow base of support;Shuffle Gait velocity: decr Gait velocity  interpretation: Below normal speed for age/gender General Gait Details: max cues for upright posture; 2 person (A) to balance and perform gt; pt with short shufflled steps    Stairs            Wheelchair Mobility    Modified Rankin (Stroke Patients Only) Modified Rankin (Stroke Patients Only) Pre-Morbid Rankin Score: Moderate disability Modified Rankin: Severe disability     Balance Overall balance assessment: Needs assistance Sitting-balance support: Feet supported;No upper extremity supported Sitting balance-Leahy Scale: Fair Sitting balance - Comments: progressed to supervision while sitting EOB; slight lean posteriorly; can correct with max verbal cues  Postural control: Posterior lean Standing balance support: During functional activity;Bilateral upper extremity supported Standing balance-Leahy Scale: Poor Standing balance comment: 2 person (A) to maintain balance                     Cognition Arousal/Alertness: Awake/alert Behavior During Therapy: Flat affect Overall Cognitive Status: Impaired/Different from baseline Area of Impairment: Orientation;Problem solving;Following commands;Memory;Safety/judgement Orientation Level: Disoriented to;Place;Time;Situation   Memory: Decreased short-term memory Following Commands: Follows one step commands with increased time Safety/Judgement: Decreased awareness of deficits;Decreased awareness of safety   Problem Solving: Slow processing General Comments: pt more verbal today; no family present to determine baseline cognition    Exercises      General Comments        Pertinent Vitals/Pain No c/o pain; VSS throught session; O2 at 91% on RA     Home Living                      Prior Function            PT Goals (current goals can  now be found in the care plan section) Acute Rehab PT Goals Patient Stated Goal: " i got to pee"  PT Goal Formulation: With patient Time For Goal Achievement:  05/14/14 Potential to Achieve Goals: Good Progress towards PT goals: Progressing toward goals    Frequency  Min 4X/week    PT Plan Current plan remains appropriate    Co-evaluation             End of Session Equipment Utilized During Treatment: Gait belt Activity Tolerance: Patient tolerated treatment well Patient left: in bed;with call bell/phone within reach;with bed alarm set     Time: 1120-1136 PT Time Calculation (min): 16 min  Charges:  $Gait Training: 8-22 mins                    G CodesDonell Sievert, [place]  [phone] 05/01/2014, 12:55 PM

## 2014-05-01 NOTE — ED Provider Notes (Signed)
Medical screening examination/treatment/procedure(s) were conducted as a shared visit with non-physician practitioner(s) and myself.  I personally evaluated the patient during the encounter.  From home with AMS and expressive aphasia x 4 days.  Hx lumbar spine infection, on IV rocphein until 8/6.  No fever.  Recent ED visit for fall. Pleasant, oriented x2.  5/5 strength throughout. Lumbar incision appears to be healing well. SDH on CT scan. No anticoagulant use.  D/w Dr Franky Macho and Dr. Waynard Edwards.    EKG Interpretation None       Glynn Octave, MD 05/01/14 1222

## 2014-05-01 NOTE — Clinical Social Work Psychosocial (Signed)
Clinical Social Work Department BRIEF PSYCHOSOCIAL ASSESSMENT 05/01/2014  Patient:  Marissa Parsons, Marissa Parsons     Account Number:  [id number]     Admit date:  04/29/2014  Clinical Social Worker:  Mosie Epstein  Date/Time:  05/01/2014 02:02 PM  Referred by:  Physician  Date Referred:  05/01/2014 Referred for  SNF Placement   Other Referral:   none.   Interview type:  Family Other interview type:   CSW spoke with pt's brother, Brunetta Genera, regarding discharge disposition.    PSYCHOSOCIAL DATA Living Status:  FAMILY Admitted from facility:   Level of care:   Primary support name:  Brunetta Genera Primary support relationship to patient:  SIBLING Degree of support available:   Strong support system.    CURRENT CONCERNS Current Concerns  Post-Acute Placement   Other Concerns:   none.    SOCIAL WORK ASSESSMENT / PLAN CSW following for SNF as a back-up plan to CIR. CSW spoke with pt's brother regarding discharge disposition. Pt's brother stated he was not "at the point" of discussing discharge dispositions for pt as she is not medically stable. CSW informed pt's brother that CSW was completing a discharge plan as a preemptive measure. Pt's brother adamont he is not ready to discuss discharge planning.   Assessment/plan status:  Psychosocial Support/Ongoing Assessment of Needs Other assessment/ plan:   none.   Information/referral to community resources:   CSW to continue to follow and attempt to provide SNF resources as a back-up plan to CIR.    PATIENT'S/FAMILY'S RESPONSE TO PLAN OF CARE: Pt's brother expressed concern for pt's medical condition. CSW offered support to pt's brother. CSW will reattempt to discuss discharge planning.       Marcelline Deist, MSW, [facility] Licensed Clinical Social Worker [phone] and [phone]

## 2014-05-02 ENCOUNTER — Inpatient Hospital Stay (HOSPITAL_COMMUNITY): Payer: Medicare HMO

## 2014-05-02 DIAGNOSIS — R509 Fever, unspecified: Secondary | ICD-10-CM

## 2014-05-02 LAB — BASIC METABOLIC PANEL
Anion gap: 10 (ref 5–15)
BUN: 8 mg/dL (ref 6–23)
CHLORIDE: 102 meq/L (ref 96–112)
CO2: 25 mEq/L (ref 19–32)
Calcium: 8.4 mg/dL (ref 8.4–10.5)
Creatinine, Ser: 0.51 mg/dL (ref 0.50–1.10)
GFR calc Af Amer: >90 mL/min (ref >90)
GFR, EST NON AFRICAN AMERICAN: 87 mL/min — AB (ref >90)
GLUCOSE: 93 mg/dL (ref 70–99)
POTASSIUM: 3.5 meq/L — AB (ref 3.7–5.3)
SODIUM: 137 meq/L (ref 137–147)

## 2014-05-02 LAB — URINE CULTURE
COLONY COUNT: NO GROWTH
Culture: NO GROWTH

## 2014-05-02 LAB — C-REACTIVE PROTEIN: CRP: 13.7 mg/dL — ABNORMAL HIGH (ref <0.60)

## 2014-05-02 LAB — CBC
HCT: 33.7 % — ABNORMAL LOW (ref 36.0–46.0)
HEMOGLOBIN: 10.9 g/dL — AB (ref 12.0–15.0)
MCH: 31.5 pg (ref 26.0–34.0)
MCHC: 32.3 g/dL (ref 30.0–36.0)
MCV: 97.4 fL (ref 78.0–100.0)
Platelets: 288 10*3/uL (ref 150–400)
RBC: 3.46 MIL/uL — AB (ref 3.87–5.11)
RDW: 13.9 % (ref 11.5–15.5)
WBC: 10.4 10*3/uL (ref 4.0–10.5)

## 2014-05-02 LAB — CLOSTRIDIUM DIFFICILE BY PCR: CDIFFPCR: POSITIVE — AB

## 2014-05-02 LAB — SEDIMENTATION RATE: Sed Rate: 44 mm/hr — ABNORMAL HIGH (ref 0–22)

## 2014-05-02 MED ORDER — LEVOTHYROXINE SODIUM 100 MCG PO TABS
100.0000 ug | ORAL_TABLET | ORAL | Status: DC
  Administered 2014-05-04: 100 ug via ORAL
  Filled 2014-05-02 (×4): qty 1

## 2014-05-02 MED ORDER — VANCOMYCIN HCL 500 MG IV SOLR
500.0000 mg | Freq: Two times a day (BID) | INTRAVENOUS | Status: DC
  Administered 2014-05-02 – 2014-05-03 (×3): 500 mg via INTRAVENOUS
  Filled 2014-05-02 (×5): qty 500

## 2014-05-02 MED ORDER — POTASSIUM CHLORIDE IN NACL 20-0.9 MEQ/L-% IV SOLN
INTRAVENOUS | Status: DC
  Administered 2014-05-02 – 2014-05-04 (×4): via INTRAVENOUS
  Filled 2014-05-02 (×5): qty 1000

## 2014-05-02 MED ORDER — LEVOTHYROXINE SODIUM 50 MCG PO TABS
50.0000 ug | ORAL_TABLET | ORAL | Status: DC
  Filled 2014-05-02 (×2): qty 1

## 2014-05-02 MED ORDER — METRONIDAZOLE 500 MG PO TABS
500.0000 mg | ORAL_TABLET | Freq: Three times a day (TID) | ORAL | Status: DC
  Administered 2014-05-02 (×2): 500 mg via ORAL
  Filled 2014-05-02 (×5): qty 1

## 2014-05-02 MED ORDER — LEVOTHYROXINE SODIUM 50 MCG PO TABS: 50.0000 ug | ORAL_TABLET | ORAL | Status: DC

## 2014-05-02 MED ORDER — LACOSAMIDE 50 MG PO TABS
100.0000 mg | ORAL_TABLET | Freq: Two times a day (BID) | ORAL | Status: DC
  Administered 2014-05-02 – 2014-05-04 (×6): 100 mg via ORAL
  Filled 2014-05-02 (×6): qty 2

## 2014-05-02 NOTE — Progress Notes (Signed)
Patient ID: Marissa Parsons, female   DOB: 1928/10/24, 80 y.o.   MRN: [id number] Subjective: Patient reports she's doing ok. Denies headache. Denies back pain.  Objective: Vital signs in last 24 hours: Temp:  [98.2 F (36.8 C)-101.3 F (38.5 C)] 98.8 F (37.1 C) (08/06 1452) Pulse Rate:  [83-107] 83 (08/06 1452) Resp:  [18-29] 29 (08/06 1452) BP: (110-145)/(33-63) 110/33 mmHg (08/06 1452) SpO2:  [69 %-96 %] 92 % (08/06 1558)  Intake/Output from previous day: 08/05 0701 - 08/06 0700 In: 1010 [I.V.:1010] Out: 1650 [Urine:1650] Intake/Output this shift: Total I/O In: 403.8 [I.V.:303.8; IV Piggyback:100] Out: 150 [Urine:150]  awake and alert, states name, names pen, watch and glasses, repeats simple words, follows commands, wound ok   Lab Results: Lab Results  Component Value Date   WBC 10.4 05/02/2014   HGB 10.9* 05/02/2014   HCT 33.7* 05/02/2014   MCV 97.4 05/02/2014   PLT 288 05/02/2014   Lab Results  Component Value Date   INR 1.02 03/04/2014   BMET Lab Results  Component Value Date   NA 137 05/02/2014   K 3.5* 05/02/2014   CL 102 05/02/2014   CO2 25 05/02/2014   GLUCOSE 93 05/02/2014   BUN 8 05/02/2014   CREATININE 0.51 05/02/2014   CALCIUM 8.4 05/02/2014    Studies/Results: Dg Chest Port 1 View  05/01/2014   CLINICAL DATA:  Possible aspiration.  EXAM: PORTABLE CHEST - 1 VIEW  COMPARISON:  04/29/2014  FINDINGS: Right upper extremity PICC, tip at the level of the SVC.  No cardiomegaly. Mediastinal contours are distorted by rightward rotation.  Mild, new interstitial coarsening asymmetric at the right base.  Remote right humeral neck fracture.  IMPRESSION: Suspect mild pneumonitis in the right lower lobe.   Electronically Signed   By: Tiburcio Pea [name]   On: 05/01/2014 01:29   Dg Chest Port 1v Same Day  05/02/2014   CLINICAL DATA:  Fever  EXAM: PORTABLE CHEST - 1 VIEW SAME DAY  COMPARISON:  05/01/2014  FINDINGS: Cardiomediastinal silhouette is stable. Question small right pleural  effusion. Hazy right basilar atelectasis or infiltrate. Stable right arm PICC line position. No pulmonary edema.  IMPRESSION: Small right pleural effusion. Hazy right basilar atelectasis or infiltrate. No pulmonary edema.   Electronically Signed   By: Natasha Mead [name]   On: 05/02/2014 14:26    Assessment/Plan: Stable neuro exam.    LOS: 3 days    Barnet Benavides S 05/02/2014, 6:25 PM

## 2014-05-02 NOTE — Progress Notes (Signed)
Patient is transferred from unit 3S to room 4N09 at this time. Alert and in stable condition.

## 2014-05-02 NOTE — Progress Notes (Signed)
Patient ID: Marissa Parsons, female   DOB: [birth year], 80 y.o.   MRN: [id number]         [facility]    Date of Admission:  04/29/2014            Day 42 ceftriaxone        Day 1 vancomycin Principal Problem:   Fever Active Problems:   Macular degeneration   Lumbar spinal stenosis   S/P lumbar laminectomy   Wound infection after surgery   Subdural hematoma   Aphasia   . bisoprolol-hydrochlorothiazide  1 tablet Oral Daily  . diltiazem  240 mg Oral Daily  . folic acid  1 mg Intravenous Daily  . lacosamide  100 mg Oral BID  . levothyroxine  100 mcg Oral Once per day on Sun Tue Wed Thu Sat  . [START ON 05/03/2014] levothyroxine  50 mcg Oral Once per day on Mon Fri  . methotrexate  10 mg Oral Once per day on Tue Wed  . predniSONE  5 mg Oral Q breakfast  . saccharomyces boulardii  250 mg Oral BID  . vancomycin  500 mg Intravenous Q12H    Subjective: [name] is an 80 year old who underwent lumbar laminectomy in early June for lumbar stenosis. She developed a postoperative MSSA wound infection. My partner, Dr. Judyann Munson, recommended 6 weeks of IV ceftriaxone. She recently fell and sustained a subdural hematoma. He was admitted with progressive confusion and aphasia 3 days ago. Last night she developed fever and I was reconsulted. Review of Systems: Review of systems not obtained due to patient factors.  Past Medical History  Diagnosis Date  . Hypertension   . Rheumatoid arteritis   . Phlebitis   . Deep vein thrombophlebitis of leg   . Fractured pelvis     fall 2011  . Thyroid disease     hypothyroidism  . Humerus fracture   . Thrombocytopenia   . Leukocytosis   . Hypothyroidism   . Phlebitis      Recurrent phlebitis  . Hematoma      Right superior and inferior pubic rami fractures with hematoma adjacent to the right ramus fracture  . Urinary tract infection   . Humerus fracture      Right proximal humerus fracture  . Osteoarthritis     for which she takes chronic prednisone  . Macular degeneration   . Chronic anticoagulation   . Fracture of multiple pubic rami 01/16/2013  . Dementia     MILD    History  Substance Use Topics  . Smoking status: Former Games developer  . Smokeless tobacco: Never Used  . Alcohol Use: No    Family History  Problem Relation Age of Onset  . Abnormal EKG Neg Hx   . Abnormal newborn screen Neg Hx   . Achalasia Neg Hx   . Achondroplasia Neg Hx   . Acne Neg Hx   . Acromegaly Neg Hx   . Actinic keratosis Neg Hx   . Acute lymphoblastic leukemia Neg Hx   . Addison's disease Neg Hx   . Adrenal disorder Neg Hx   . Albinism Neg Hx   . Alcohol abuse Neg Hx   . Alkaptonuria Neg Hx   . Allergic rhinitis Neg Hx   . Allergies Neg Hx   . Allergy (severe) Neg Hx   . Alopecia Neg Hx   . Alpha-1 antitrypsin deficiency Neg Hx   . Alport syndrome Neg Hx   . ALS Neg  Hx   . Alzheimer's disease Neg Hx   . Ambiguous genitalia Neg Hx   . Amblyopia Neg Hx   . Amenorrhea Neg Hx   . Anal fissures Neg Hx   . Anemia Neg Hx   . Anesthesia problems Neg Hx   . Aneurysm Neg Hx   . Angelman syndrome Neg Hx   . Angina Neg Hx   . Angioedema Neg Hx   . Ankylosing spondylitis Neg Hx   . Anorectal malformation Neg Hx   . Anorexia nervosa Neg Hx   . Anti-cardiolipin syndrome Neg Hx   . Antithrombin III deficiency Neg Hx   . Anuerysm Neg Hx   . Anxiety disorder Neg Hx   . Aortic aneurysm Neg Hx   . Aortic dissection Neg Hx   . Aortic stenosis Neg Hx   . Aphthous stomatitis Neg Hx   . Aplastic anemia Neg Hx   . Appendicitis Neg Hx   . Apraxia Neg Hx   . Arnold-Chiari malformation Neg Hx   . Arrhythmia Neg Hx   . Arthritis Neg Hx   . Asperger's syndrome Neg Hx   . Asthma Neg Hx   . Ataxia Neg Hx   . Ataxia telangiectasia Neg Hx   . Atopy Neg Hx   . Atrial fibrillation Neg Hx   . Atrophic kidney Neg Hx   . Auditory processing disorder Neg Hx   . Autism Neg Hx   . Autism spectrum disorder Neg Hx   .  Autoimmune disease Neg Hx   . AVM Neg Hx   . Baker's cyst Neg Hx   . Barrett's esophagus Neg Hx   . Bartter's syndrome Neg Hx   . Basal cell carcinoma Neg Hx   . Behavior problems Neg Hx   . Bell's palsy Neg Hx   . Benign prostatic hyperplasia Neg Hx   . Bipolar disorder Neg Hx   . Birth defects Neg Hx   . Birth marks Neg Hx   . Blindness Neg Hx   . Bone cancer Neg Hx   . BOR syndrome Neg Hx   . Bow legs Neg Hx   . Bradycardia Neg Hx   . Brain cancer Neg Hx   . BRCA 1/2 Neg Hx   . Breast cancer Neg Hx   . Broken bones Neg Hx   . Bronchiolitis Neg Hx   . Bronchopulmonary dysplasia Neg Hx   . Bruton's disease Neg Hx   . Bulemia Neg Hx   . Bullous pemphigoid Neg Hx   . Bunion Neg Hx   . Bursitis Neg Hx   . Caf-au-lait spots Neg Hx   . Calcium disorder Neg Hx   . Canavan disease Neg Hx   . Cancer Neg Hx   . Cardiomyopathy Neg Hx   . Carpal tunnel syndrome Neg Hx   . Cataracts Neg Hx   . Celiac disease Neg Hx   . Cerebral aneurysm Neg Hx   . Cerebral palsy Neg Hx   . Cervical cancer Neg Hx   . Cervical polyp Neg Hx   . Cervicitis Neg Hx   . Chalasia Neg Hx   . Chalazion Neg Hx   . Charcot-Marie-Tooth disease Neg Hx   . Chediak-Higashi syndrome Neg Hx   . Chiari malformation Neg Hx   . Childhood respiratory disease Neg Hx   . Choanal atresia Neg Hx   . Cholecystitis Neg Hx   . Cholelithiasis Neg Hx   . Cholesteatoma Neg  Hx   . Chondromalacia Neg Hx   . Chorea Neg Hx   . Chromosomal disorder Neg Hx   . Chronic bronchitis Neg Hx   . Chronic fatigue Neg Hx   . Chronic granulomatous disease Neg Hx   . Chronic infections Neg Hx   . Cirrhosis Neg Hx   . Cleft lip Neg Hx   . Cleft palate Neg Hx   . Clotting disorder Neg Hx   . Club foot Neg Hx   . Coarctation of the aorta Neg Hx   . Colitis Neg Hx   . Collagen disease Neg Hx   . Colon cancer Neg Hx   . Colon polyps Neg Hx   . Colonic polyp Neg Hx   . Color blindness Neg Hx   . Conduct disorder Neg Hx   .  Conductive hearing loss Neg Hx   . Congenital adrenal hyperplasia Neg Hx   . Congenital heart disease Neg Hx   . Conjunctivitis Neg Hx   . Consanguinity Neg Hx   . Constipation Neg Hx   . COPD Neg Hx   . Corneal abrasion Neg Hx   . Corneal ulcer Neg Hx   . Coronary aneurysm Neg Hx   . Coronary artery disease Neg Hx   . Cowden syndrome Neg Hx   . Craniosynostosis Neg Hx   . Cri-du-chat syndrome Neg Hx   . Crohn's disease Neg Hx   . Cushing syndrome Neg Hx   . Cystic fibrosis Neg Hx   . Cystic kidney disease Neg Hx   . Cystinosis Neg Hx   . Decreased libido Neg Hx   . Deep vein thrombosis Neg Hx   . Delayed menopause Neg Hx   . Delayed puberty Neg Hx   . Dementia Neg Hx   . Dental caries Neg Hx   . Depression Neg Hx   . Dermatomyositis Neg Hx   . DES usage Neg Hx   . Developmental delay Neg Hx   . Diabetes Neg Hx   . Diabetes insipidus Neg Hx   . Diabetes type I Neg Hx   . Diabetes type II Neg Hx   . Diabetic kidney disease Neg Hx   . DiGeorge syndrome Neg Hx   . Dilated cardiomyopathy Neg Hx   . Dislocations Neg Hx   . Diverticulitis Neg Hx   . Diverticulosis Neg Hx   . Down syndrome Neg Hx   . Drug abuse Neg Hx   . Dupuytren's contracture Neg Hx   . Dwarfism Neg Hx   . Dysfunctional uterine bleeding Neg Hx   . Dysmenorrhea Neg Hx   . Dyspareunia Neg Hx   . Dysphagia Neg Hx   . Dysrhythmia Neg Hx   . Dystonia Neg Hx   . Early death Neg Hx   . Early menopause Neg Hx   . Early puberty Neg Hx   . Eating disorder Neg Hx   . Eclampsia Neg Hx   . Ectodermal dysplasia Neg Hx   . Eczema Neg Hx   . Edema Neg Hx   . Edward's syndrome Neg Hx   . Ehlers-Danlos syndrome Neg Hx   . Emotional abuse Neg Hx   . Emphysema Neg Hx   . Encopresis Neg Hx   . Endocrine tumor Neg Hx   . Endocrinopathy Neg Hx   . Endometrial cancer Neg Hx   . Endometriosis Neg Hx   . Enuresis Neg Hx   . Eosinophilic granuloma Neg Hx   .  Epididymitis Neg Hx   . Erectile dysfunction Neg Hx     . Erythema nodosum Neg Hx   . Esophageal cancer Neg Hx   . Esophageal varices Neg Hx   . Esophagitis Neg Hx   . Exostosis Neg Hx   . Fabry's disease Neg Hx   . Factor IX deficiency Neg Hx   . Factor V Leiden deficiency Neg Hx   . Factor VIII deficiency Neg Hx   . Failure to thrive Neg Hx   . Fainting Neg Hx   . Familial dysautonomia Neg Hx   . Familial nephritis Neg Hx   . Familial polyposis Neg Hx   . Fanconi anemia Neg Hx   . Febrile seizures Neg Hx   . Fibrocystic breast disease Neg Hx   . Fibroids Neg Hx   . Fibromyalgia Neg Hx   . Food intolerance Neg Hx   . Fragile X syndrome Neg Hx   . Friedreich's ataxia Neg Hx   . Frontotemporal dementia Neg Hx   . Fuch's dystrophy Neg Hx   . Gait disorder Neg Hx   . Galactosemia Neg Hx   . Gallbladder disease Neg Hx   . Gaucher's disease Neg Hx   . Genodermatoses Neg Hx   . GER disease Neg Hx   . Gestational diabetes Neg Hx   . GI problems Neg Hx   . Glaucoma Neg Hx   . Glomerulonephritis Neg Hx   . Glucose-6-phos deficiency Neg Hx   . Glycogen storage disease Neg Hx   . Goiter Neg Hx   . Gonadal disorder Neg Hx   . Gout Neg Hx   . Graves' disease Neg Hx   . Growth hormone deficiency Neg Hx   . GU problems Neg Hx   . Hammer toes Neg Hx   . Hartnup's disease Neg Hx   . Hashimoto's thyroiditis Neg Hx   . Healthy Neg Hx   . Hearing loss Neg Hx   . Heart block Neg Hx   . Heart defect Neg Hx   . Heart disease Neg Hx   . Heart failure Neg Hx   . Heart murmur Neg Hx   . Hemangiomas Neg Hx   . Hematuria Neg Hx   . Hemochromatosis Neg Hx   . Hemolytic uremic syndrome Neg Hx   . Hemophilia Neg Hx   . Henoch-Schonlein purpura Neg Hx   . Hepatitis Neg Hx   . Hepatomegaly Neg Hx   . Hereditary spherocytosis Neg Hx   . Hernia Neg Hx   . Hiatal hernia Neg Hx   . High arches Neg Hx   . Hip dysplasia Neg Hx   . Hip fracture Neg Hx   . Hirschsprung's disease Neg Hx   . Hirsutism Neg Hx   . Histiocytosis X Neg Hx   . HIV  Neg Hx   . HLA-B27 positive Neg Hx   . Hodgkin's lymphoma Neg Hx   . Homocystinuria Neg Hx   . Hunter's disease Neg Hx   . Huntington's disease Neg Hx   . Hydrocele Neg Hx   . Hydrocephalus Neg Hx   . Hygroma Neg Hx   . Hypercalcemia Neg Hx   . Hypereosinophilic syndrome Neg Hx   . Hyperinsulinemia Neg Hx   . Hyperkalemia Neg Hx   . Hyperlipidemia Neg Hx   . Hypermobility Neg Hx   . Hypernasality Neg Hx   . Hyperopia Neg Hx   . Hyperparathyroidism Neg Hx   .  Hypersomnolence Neg Hx   . Hypertension Neg Hx   . Hyperthyroidism Neg Hx   . Hypertrophic cardiomyopathy Neg Hx   . Hypokalemia Neg Hx   . Hypoparathyroidism Neg Hx   . Hypoplastic kidney Neg Hx   . Hypotension Neg Hx   . Hypothyroidism Neg Hx   . Idiopathic pulmonary fibrosis Neg Hx   . Idiopathic torsion dystonia Neg Hx   . IgA nephropathy Neg Hx   . Immunodeficiency Neg Hx   . Imperforate anus Neg Hx   . Impotence Neg Hx   . Impulse control disorder Neg Hx   . Incompetent cervix Neg Hx   . Infertility Neg Hx   . Inflammatory bowel disease Neg Hx   . Inguinal hernia Neg Hx   . Insomnia Neg Hx   . Insulin resistance Neg Hx   . Interstitial cystitis Neg Hx   . Intestinal malrotation Neg Hx   . Intestinal polyp Neg Hx   . Intracerebral hemorrhage Neg Hx   . Iron deficiency Neg Hx   . Irregular heart beat Neg Hx   . Irritable bowel syndrome Neg Hx   . Jaundice Neg Hx   . Job's syndrome Neg Hx   . Joint hypermobility Neg Hx   . Juvenile idiopathic arthritis Neg Hx   . Kartagener's syndrome Neg Hx   . Kawasaki disease Neg Hx   . Keloids Neg Hx   . Kennedy's disease Neg Hx   . Keratoconus Neg Hx   . Kidney cancer Neg Hx   . Kidney disease Neg Hx   . Kidney failure Neg Hx   . Kidney nephrosis Neg Hx   . Klinefelter's syndrome Neg Hx   . Krabbe disease Neg Hx   . Kyphosis Neg Hx   . Labyrinthitis Neg Hx   . Lactose intolerance Neg Hx   . Language disorder Neg Hx   . Lead poisoning Neg Hx   . Learning  disabilities Neg Hx   . Legg-Calve-Perthes disease Neg Hx   . Lesch-Nyhan syndrome Neg Hx   . Leukemia Neg Hx   . Lichen planus Neg Hx   . Li-Fraumeni syndrome Neg Hx   . Liver cancer Neg Hx   . Liver disease Neg Hx   . Long QT syndrome Neg Hx   . Lumbar disc disease Neg Hx   . Lung cancer Neg Hx   . Lung disease Neg Hx   . Lupus Neg Hx   . Lymphoma Neg Hx   . Macrocephaly Neg Hx   . Macrosomia Neg Hx   . Macular degeneration Neg Hx   . Malignant hypertension Neg Hx   . Malignant hyperthermia Neg Hx   . Maple syrup urine disease Neg Hx   . Marfan syndrome Neg Hx   . Mastocytosis Neg Hx   . Melanoma Neg Hx   . Memory loss Neg Hx   . Meniere's disease Neg Hx   . Menstrual problems Neg Hx   . Mental illness Neg Hx   . Mental retardation Neg Hx   . Metabolic syndrome Neg Hx   . Microcephaly Neg Hx   . Migraines Neg Hx   . Milk intolerance Neg Hx   . Miscarriages / Stillbirths Neg Hx   . Mitochondrial disorder Neg Hx   . Mitral valve prolapse Neg Hx   . Motor neuron disease Neg Hx   . Movement disorder Neg Hx   . Moyamoya disease Neg Hx   . Multiple  births Neg Hx   . Multiple endocrine neoplasia Neg Hx   . Multiple fractures Neg Hx   . Multiple myeloma Neg Hx   . Multiple sclerosis Neg Hx   . Muscle cancer Neg Hx   . Muscular dystrophy Neg Hx   . Myasthenia gravis Neg Hx   . Myelodysplastic syndrome Neg Hx   . Myocarditis Neg Hx   . Myoclonus Neg Hx   . Myopathy Neg Hx   . Nail disease Neg Hx   . Narcolepsy Neg Hx   . Nephritis Neg Hx   . Nephrolithiasis Neg Hx   . Nephrotic syndrome Neg Hx   . Neural tube defect Neg Hx   . Neurodegenerative disease Neg Hx   . Neurofibromatosis Neg Hx   . Neuromuscular disorder Neg Hx   . Neuropathy Neg Hx   . Neutropenia Neg Hx   . Nevi Neg Hx   . Niemann-Pick disease Neg Hx   . Night blindness Neg Hx   . Nocturnal enuresis Neg Hx   . Nystagmus Neg Hx   . Obesity Neg Hx   . OCD Neg Hx   . ODD Neg Hx   . Orchitis Neg Hx    . Osler-Weber-Rendu syndrome Neg Hx   . Osteoarthritis Neg Hx   . Osteochondroma Neg Hx   . Osteogenesis imperfecta Neg Hx   . Osteopenia Neg Hx   . Osteoporosis Neg Hx   . Osteosarcoma Neg Hx   . Osteosclerosis Neg Hx   . Other Neg Hx   . Otitis media Neg Hx   . Ovarian cancer Neg Hx   . Ovarian cysts Neg Hx   . Oxalosis Neg Hx   . Paget's disease of bone Neg Hx   . Pancreatic cancer Neg Hx   . Pancreatitis Neg Hx   . Panhypopituitarism Neg Hx   . Panic disorder Neg Hx   . Paranoid behavior Neg Hx   . Parasomnia Neg Hx   . Parkinsonism Neg Hx   . Patau's syndrome Neg Hx   . Pathological gambling Neg Hx   . PDD Neg Hx   . Pectus carinatum Neg Hx   . Pectus excavatum Neg Hx   . Pelvic inflammatory disease Neg Hx   . Pemphigus vulgaris Neg Hx   . Penile cancer Neg Hx   . Periodic limb movement Neg Hx   . Peripheral vascular disease Neg Hx   . Pernicious anemia Neg Hx   . Personality disorder Neg Hx   . Pes cavus Neg Hx   . Physical abuse Neg Hx   . Otilio Jefferson sequence Neg Hx   . PKU Neg Hx   . Plantar fasciitis Neg Hx   . Pleurisy Neg Hx   . Pneumonia Neg Hx   . Polychondritis Neg Hx   . Polycystic kidney disease Neg Hx   . Polycystic ovary syndrome Neg Hx   . Polycythemia Neg Hx   . Polymyalgia rheumatica Neg Hx   . Polymyositis Neg Hx   . Pompe disease Neg Hx   . Positive PPD/TB Exposure Neg Hx   . Posterior urethral valves Neg Hx   . Post-traumatic stress disorder Neg Hx   . Prader-Willi syndrome Neg Hx   . Premature birth Neg Hx   . Premature ovarian failure Neg Hx   . Preterm labor Neg Hx   . Prolactinoma Neg Hx   . Prostate cancer Neg Hx   . Prostatitis Neg Hx   .  Protein C deficiency Neg Hx   . Protein S deficiency Neg Hx   . Proteinuria Neg Hx   . Prune belly syndrome Neg Hx   . Pruritis Neg Hx   . Pseudochol deficiency Neg Hx   . Pseudotumor cerebri Neg Hx   . Psoriasis Neg Hx   . Psychosis Neg Hx   . Ptosis Neg Hx   . Pulmonary embolism  Neg Hx   . Pulmonary fibrosis Neg Hx   . Pyelonephritis Neg Hx   . Pyloric stenosis Neg Hx   . Pyruvate dehydrogenase deficiency Neg Hx   . Rashes / Skin problems Neg Hx   . Raynaud syndrome Neg Hx   . Rectal cancer Neg Hx   . Recurrent abdominal pain Neg Hx   . Reiter's syndrome Neg Hx   . Renal tubular acidosis Neg Hx   . Restless legs syndrome Neg Hx   . Retinal degeneration Neg Hx   . Retinal detachment Neg Hx   . Retinitis pigmentosa Neg Hx   . Retinoblastoma Neg Hx   . Retinopathy of prematurity Neg Hx   . Reye's syndrome Neg Hx   . Rheum arthritis Neg Hx   . Rheumatic fever Neg Hx   . Rheumatologic disease Neg Hx   . Rickets Neg Hx   . Rosacea Neg Hx   . Sacroiliitis Neg Hx   . Sarcoidosis Neg Hx   . Schizophrenia Neg Hx   . Scleritis Neg Hx   . Scleroderma Neg Hx   . Scoliosis Neg Hx   . Seasonal affective disorder Neg Hx   . Seizures Neg Hx   . Selective mutism Neg Hx   . Sensorineural hearing loss Neg Hx   . Severe combined immunodeficiency Neg Hx   . Severe sprains Neg Hx   . Sexual abuse Neg Hx   . Short stature Neg Hx   . Sick sinus syndrome Neg Hx   . Sickle cell anemia Neg Hx   . Sickle cell trait Neg Hx   . SIDS Neg Hx   . Single kidney Neg Hx   . Sinusitis Neg Hx   . Sjogren's syndrome Neg Hx   . Skeletal dysplasia Neg Hx   . Skin cancer Neg Hx   . Skin telangiectasia Neg Hx   . Sleep apnea Neg Hx   . Sleep disorder Neg Hx   . Sleep walking Neg Hx   . Snoring Neg Hx   . Social phobia Neg Hx   . Speech disorder Neg Hx   . Spina bifida Neg Hx   . Spinal muscular atrophy Neg Hx   . Splenomegaly Neg Hx   . Spondyloarthropathy Neg Hx   . Spondylolisthesis Neg Hx   . Spondylolysis Neg Hx   . Squamous cell carcinoma Neg Hx   . Stevens-Johnson syndrome Neg Hx   . Stickler syndrome Neg Hx   . Stomach cancer Neg Hx   . Strabismus Neg Hx   . Stroke Neg Hx   . Stuttering Neg Hx   . Subarachnoid hemorrhage Neg Hx   . Sudden death Neg Hx   .  Suicidality Neg Hx   . Supraventricular tachycardia Neg Hx   . Swallowing difficulties Neg Hx   . Tall stature Neg Hx   . Tay-Sachs disease Neg Hx   . Testicular cancer Neg Hx   . Thalassemia Neg Hx   . Throat cancer Neg Hx   . Thrombocytopenia Neg Hx   .  Thrombophilia Neg Hx   . Thrombophlebitis Neg Hx   . Thrombosis Neg Hx   . Thymic aplasia Neg Hx   . Thyroid cancer Neg Hx   . Thyroid disease Neg Hx   . Thyroid nodules Neg Hx   . Tics Neg Hx   . Tongue cancer Neg Hx   . Torticollis Neg Hx   . Tourette syndrome Neg Hx   . Tracheal cancer Neg Hx   . Tracheoesophageal fistual Neg Hx   . Tracheomalacia Neg Hx   . Transient ischemic attack Neg Hx   . Treacher Collins syndrome Neg Hx   . Tremor Neg Hx   . Tuberculosis Neg Hx   . Tuberous sclerosis Neg Hx   . Turner syndrome Neg Hx   . Ulcerative colitis Neg Hx   . Ulcers Neg Hx   . Undescended testes Neg Hx   . Unexplained death Neg Hx   . Urinary tract infection Neg Hx   . Urolithiasis Neg Hx   . Urticaria Neg Hx   . Uterine cancer Neg Hx   . Uveitis Neg Hx   . Vaginal cancer Neg Hx   . Valvular heart disease Neg Hx   . Vasculitis Neg Hx   . Velocardiofacial syndrome Neg Hx   . Venous thrombosis Neg Hx   . Verruca vulgaris  Neg Hx   . Vesicoureteral reflux Neg Hx   . Vision loss Neg Hx   . Vitamin D deficiency Neg Hx   . Vitiligo Neg Hx   . Voice disorder Neg Hx   . Von Gierke's disease Neg Hx   . Von Hippel-Lindau syndrome Neg Hx   . Von Willebrand disease Neg Hx   . Werdnig Hoffman Disease Neg Hx   . Williams syndrome Neg Hx   . Wilm's tumor Neg Hx   . Wilson's disease Neg Hx   . Wiskott-Aldrich syndrome Neg Hx   . Evelene Croon Parkinson White syndrome Neg Hx   . Xeroderma pigmentosa Neg Hx   . Heart attack Mother   . Heart attack Father     Allergies  Allergen Reactions  . Morphine And Related     nausea  . Tramadol Other (See Comments)    unknown  . Penicillins Rash    Objective: Temp:  [98.2 F  (36.8 C)-101.3 F (38.5 C)] 99.6 F (37.6 C) (08/06 1113) Pulse Rate:  [101-107] 107 (08/06 1209) Resp:  [18-29] 29 (08/06 0800) BP: (126-145)/(51-63) 129/62 mmHg (08/06 0800) SpO2:  [69 %-96 %] 69 % (08/06 1209)  General: She is resting quietly in bed. She is able to respond to simple questions but generally answers no to all questions. Skin: Faint scattered erythema. Right arm PICC site appears normal Lungs: Clear Cor: Regular S1 and S2 with no murmurs Abdomen: Soft and not obviously tender. Her nurse reports that she's had multiple loose stools in the past 24 hours Lumbar incision has healed without obvious signs of infection  Lab Results Lab Results  Component Value Date   WBC 10.4 05/02/2014   HGB 10.9* 05/02/2014   HCT 33.7* 05/02/2014   MCV 97.4 05/02/2014   PLT 288 05/02/2014    Lab Results  Component Value Date   CREATININE 0.51 05/02/2014   BUN 8 05/02/2014   NA 137 05/02/2014   K 3.5* 05/02/2014   CL 102 05/02/2014   CO2 25 05/02/2014    Lab Results  Component Value Date   ALT 27 04/29/2014   AST 64*  04/29/2014   ALKPHOS 78 04/29/2014   BILITOT 0.2* 04/29/2014      Sed Rate (mm/hr)  Date Value  03/26/2014 61*     CRP (mg/dL)  Date Value  4/69/6295 4.7*   Microbiology: Recent Results (from the past 240 hour(s))  CULTURE, BLOOD (ROUTINE X 2)     Status: None   Collection Time    04/29/14  5:31 PM      Result Value Ref Range Status   Specimen Description BLOOD PICC LINE   Final   Special Requests BOTTLES DRAWN AEROBIC AND ANAEROBIC R5CC B 8CC   Final   Culture  Setup Time     Final   Value: 04/29/2014 22:16     Performed at [facility]   Culture     Final   Value:        BLOOD CULTURE RECEIVED NO GROWTH TO DATE CULTURE WILL BE HELD FOR 5 DAYS BEFORE ISSUING A FINAL NEGATIVE REPORT     Performed at [facility]   Report Status PENDING   Incomplete  CULTURE, BLOOD (ROUTINE X 2)     Status: None   Collection Time    04/29/14  5:38 PM      Result Value Ref  Range Status   Specimen Description BLOOD LEFT ARM   Final   Special Requests BOTTLES DRAWN AEROBIC AND ANAEROBIC 6CC   Final   Culture  Setup Time     Final   Value: 04/29/2014 22:17     Performed at [facility]   Culture     Final   Value:        BLOOD CULTURE RECEIVED NO GROWTH TO DATE CULTURE WILL BE HELD FOR 5 DAYS BEFORE ISSUING A FINAL NEGATIVE REPORT     Performed at [facility]   Report Status PENDING   Incomplete  URINE CULTURE     Status: None   Collection Time    04/29/14  5:50 PM      Result Value Ref Range Status   Specimen Description URINE, CATHETERIZED   Final   Special Requests NONE   Final   Culture  Setup Time     Final   Value: 04/29/2014 18:17     Performed at [facility] Count     Final   Value: NO GROWTH     Performed at [facility]   Culture     Final   Value: NO GROWTH     Performed at [facility]   Report Status 04/30/2014 FINAL   Final  MRSA PCR SCREENING     Status: None   Collection Time    04/29/14 10:45 PM      Result Value Ref Range Status   MRSA by PCR NEGATIVE  NEGATIVE Final   Comment:            The GeneXpert MRSA Assay (FDA     approved for NASAL specimens     only), is one component of a     comprehensive MRSA colonization     surveillance program. It is not     intended to diagnose MRSA     infection nor to guide or     monitor treatment for     MRSA infections.  URINE CULTURE     Status: None   Collection Time    05/01/14  9:06 AM      Result Value Ref Range Status  Specimen Description URINE, CATHETERIZED   Final   Special Requests rocephin Immunocompromised   Final   Culture  Setup Time     Final   Value: 05/01/2014 16:08     Performed at [facility]   Colony Count     Final   Value: NO GROWTH     Performed at [facility]   Culture     Final   Value: NO GROWTH     Performed at [facility]   Report Status 05/02/2014 FINAL   Final    CULTURE, BLOOD (ROUTINE X 2)     Status: None   Collection Time    05/01/14 10:10 AM      Result Value Ref Range Status   Specimen Description BLOOD LEFT ARM   Final   Special Requests BOTTLES DRAWN AEROBIC AND ANAEROBIC 10CC   Final   Culture  Setup Time     Final   Value: 05/01/2014 16:10     Performed at [facility]   Culture     Final   Value:        BLOOD CULTURE RECEIVED NO GROWTH TO DATE CULTURE WILL BE HELD FOR 5 DAYS BEFORE ISSUING A FINAL NEGATIVE REPORT     Performed at [facility]   Report Status PENDING   Incomplete  CULTURE, BLOOD (ROUTINE X 2)     Status: None   Collection Time    05/01/14 10:20 AM      Result Value Ref Range Status   Specimen Description BLOOD LEFT HAND   Final   Special Requests BOTTLES DRAWN AEROBIC ONLY 3CC   Final   Culture  Setup Time     Final   Value: 05/01/2014 16:09     Performed at [facility]   Culture     Final   Value:        BLOOD CULTURE RECEIVED NO GROWTH TO DATE CULTURE WILL BE HELD FOR 5 DAYS BEFORE ISSUING A FINAL NEGATIVE REPORT     Performed at [facility]   Report Status PENDING   Incomplete  CLOSTRIDIUM DIFFICILE BY PCR     Status: Abnormal   Collection Time    05/02/14 11:56 AM      Result Value Ref Range Status   C difficile by pcr POSITIVE (*) NEGATIVE Final   Comment: CRITICAL RESULT CALLED TO, READ BACK BY AND VERIFIED WITH:     EASON,K RN [id number] 03/04/14 LEONARD,A    Studies/Results: Dg Chest Port 1 View  05/01/2014   CLINICAL DATA:  Possible aspiration.  EXAM: PORTABLE CHEST - 1 VIEW  COMPARISON:  04/29/2014  FINDINGS: Right upper extremity PICC, tip at the level of the SVC.  No cardiomegaly. Mediastinal contours are distorted by rightward rotation.  Mild, new interstitial coarsening asymmetric at the right base.  Remote right humeral neck fracture.  IMPRESSION: Suspect mild pneumonitis in the right lower lobe.   Electronically Signed   By: Tiburcio Pea [name]   On: 05/01/2014 01:29     Assessment: The cause of her isolated fever last night is unclear. She is certainly at risk for C. difficile colitis. She did also have a PICC-related infection. Her chest x-ray was read as possible right lower lobe pneumonitis I think the findings are fairly unimpressive and I do not see any clinical correlation to suggest pneumonia now. Her urine culture is negative. I doubt that her fever is related to persistent lumbar infection.  Plan: 1. Continue current antibiotics 2. C. difficile PCR 3. Await results of repeat blood cultures 4. Repeat sedimentation rate and C-reactive protein  Cliffton Asters, MD [facility] for Infectious Disease [facility] [phone] pager   [phone] cell 05/02/2014, 1:59 PM

## 2014-05-02 NOTE — Progress Notes (Signed)
Subjective: Speech continues to be improved from admission, but sleepier this AM. Has fever  Exam: Filed Vitals:   05/02/14 0920  BP:   Pulse:   Temp: 99.7 F (37.6 C)  Resp:    Gen: In bed, NAD MS: Awake, alert, oriented to person. She appears to have word finding difficulty but is able to say "cone" when asked where she is.  She has some difficulty with repetition. CN: Pupils equal round and reactive to light, extraocular movements intact, visual fields full Motor: Moves all extremities well Sensory: Intact to light touch   Impression: 80 year old female with subdural hematoma and aphasia that seems to be improving. EEG showed cortical irritability, and I favor continuing antiepileptics for the time being. Aphasia is likely secondary to cortical irritation from blood. Her fever is likely the reason for her increased somnolence this morning. She is coughing.   Recommendations: 1) continue vimpat at 100 mg twice a day 2) infection workup per IM 3) will continue to follow, likely will need continued ST   Ritta Slot, MD [facility] [phone]  If 7pm- 7am, please page neurology on call as listed in AMION.

## 2014-05-02 NOTE — Progress Notes (Signed)
Pt tx 4 North per MD order, pt VSS, report called to receiving RN, pt fa,ily notified of tx, all questions answered

## 2014-05-02 NOTE — Clinical Documentation Improvement (Signed)
Possible Clinical Conditions? _______Cerebral Edema _______Other Condition__________________ _______Cannot Clinically Determine   Supporting Information: Per 04/29/14 - 05/01/14 CT Scan, MD progress & Consult notes : 58mm residual left-to-right midline shift without rebleed.   Thank You, Shelda Pal ,RN Clinical Documentation Specialist:  [phone]  [facility]- Health Information Management

## 2014-05-02 NOTE — Progress Notes (Signed)
Physical Therapy Treatment Patient Details Name: Marissa Parsons MRN: [id number] DOB: 1929/06/17 Today's Date: 05/02/2014    History of Present Illness pt presents with falls, memory deficits, speech deficits, visual deficits and recent L3-5 Lami.  Pt found to have lt subdural hematoma.    PT Comments    Pt more lethargic today.  Follow Up Recommendations  CIR     Equipment Recommendations  Other (comment) (to be determined.)    Recommendations for Other Services       Precautions / Restrictions Precautions Precautions: Fall;Back Precaution Comments: Laminectomy on 03/06/14. Restrictions Weight Bearing Restrictions: No    Mobility  Bed Mobility Overal bed mobility: Needs Assistance Bed Mobility: Supine to Sit;Sit to Supine     Supine to sit: +2 for physical assistance;Total assist Sit to supine: +2 for physical assistance;Total assist   General bed mobility comments: Assist to move legs off of bed and elevate trunk into sitting and to lower trunk and bring legs back up into bed to supine.  Transfers                 General transfer comment: Unable to stand with 2 person assist.  Ambulation/Gait                 Stairs            Wheelchair Mobility    Modified Rankin (Stroke Patients Only)       Balance Overall balance assessment: Needs assistance Sitting-balance support: Bilateral upper extremity supported;Feet supported Sitting balance-Leahy Scale: Poor Sitting balance - Comments: Pt sat EOB x 10 minutes with min to mod A. Had pt prop on lt elbow and then able to hold sitting with min guard for 5 sec. Postural control: Posterior lean;Right lateral lean                          Cognition Arousal/Alertness: Lethargic Behavior During Therapy: Flat affect Overall Cognitive Status: Impaired/Different from baseline   Orientation Level: Place;Time;Situation;Disoriented to Current Attention Level: Focused Memory: Decreased  short-term memory Following Commands: Follows one step commands inconsistently;Follows one step commands with increased time     Problem Solving: Slow processing;Difficulty sequencing;Requires verbal cues;Decreased initiation      Exercises General Exercises - Lower Extremity Short Arc Quad: AAROM;Both;10 reps;Supine Heel Slides: AAROM;Both;10 reps;Supine Hip ABduction/ADduction: AAROM;Both;10 reps;Supine    General Comments        Pertinent Vitals/Pain Pain Assessment: Faces Faces Pain Scale: No hurt    Home Living Family/patient expects to be discharged to:: Private residence Living Arrangements: Alone Available Help at Discharge: Family;Available PRN/intermittently;Friend(s) Type of Home: House Home Access: Stairs to enter Entrance Stairs-Rails: Can reach both Home Layout: One level Home Equipment: Walker - 2 wheels;Shower seat Additional Comments: pt does not drive due to impaired vision    Prior Function Level of Independence: Independent with assistive device(s)      Comments: Per chart prior to last admit she was Mod I with AD, however pt with speech deficits and ? cognitive deificts, so difficult to determine PLOF.     PT Goals (current goals can now be found in the care plan section) Progress towards PT goals: Not progressing toward goals - comment (lethargic)    Frequency  Min 4X/week    PT Plan Current plan remains appropriate    Co-evaluation             End of Session Equipment Utilized During Treatment: Oxygen Activity Tolerance:  Patient limited by lethargy;Patient limited by fatigue Patient left: in bed;with call bell/phone within reach;with bed alarm set     Time: 1425-1450 PT Time Calculation (min): 25 min  Charges:  $Therapeutic Exercise: 8-22 mins $Therapeutic Activity: 8-22 mins                    G Codes:      Marissa Parsons 05/15/2014, 4:52 PM  [facility] PT [phone]

## 2014-05-02 NOTE — Progress Notes (Signed)
ANTIBIOTIC CONSULT NOTE - INITIAL  Pharmacy Consult for vancomycin Indication: fever in setting of recent lumbar infection  Allergies  Allergen Reactions  . Morphine And Related     nausea  . Tramadol Other (See Comments)    unknown  . Penicillins Rash    Patient Measurements: Height: 5\' 7"  (170.2 cm) Weight: 131 lb 8 oz (59.648 kg) IBW/kg (Calculated) : 61.6 Adjusted Body Weight:   Vital Signs: Temp: 100.7 F (38.2 C) (08/06 0921) Temp src: Axillary (08/06 0921) BP: 129/62 mmHg (08/06 0800) Pulse Rate: 102 (08/06 0800) Intake/Output from previous day: 08/05 0701 - 08/06 0700 In: 1010 [I.V.:1010] Out: 1650 [Urine:1650] Intake/Output from this shift:    Labs:  Recent Labs  04/30/14 0830 05/01/14 0256 05/02/14 0510  WBC 8.4 12.3* 10.4  HGB 11.7* 12.8 10.9*  PLT 297 314 288  CREATININE 0.64 0.67 0.51    Medical History: Past Medical History  Diagnosis Date  . Hypertension   . Rheumatoid arteritis   . Phlebitis   . Deep vein thrombophlebitis of leg   . Fractured pelvis     fall 2011  . Thyroid disease     hypothyroidism  . Humerus fracture   . Thrombocytopenia   . Leukocytosis   . Hypothyroidism   . Phlebitis      Recurrent phlebitis  . Hematoma      Right superior and inferior pubic rami fractures with hematoma adjacent to the right ramus fracture  . Urinary tract infection   . Humerus fracture      Right proximal humerus fracture  . Osteoarthritis     for which she takes chronic prednisone  . Macular degeneration   . Chronic anticoagulation   . Fracture of multiple pubic rami 01/16/2013  . Dementia     MILD    Medications:  Prescriptions prior to admission  Medication Sig Dispense Refill  . alendronate (FOSAMAX) 70 MG tablet Take 70 mg by mouth once a week. Take on Monday.Take with a full glass of water on an empty stomach.      Friday aspirin EC 81 MG tablet Take 81 mg by mouth daily.      . bisoprolol-hydrochlorothiazide (ZIAC) 5-6.25 MG per  tablet Take 1 tablet by mouth daily.      . calcium citrate-vitamin D (CITRACAL+D) 315-200 MG-UNIT per tablet Take 1 tablet by mouth daily.      . cefTRIAXone 2 g in dextrose 5 % 50 mL Inject 2 g into the vein daily.  36 each  0  . diazepam (VALIUM) 5 MG tablet Take 5 mg by mouth at bedtime as needed for anxiety or muscle spasms.       09-30-2001 diltiazem (CARDIZEM CD) 240 MG 24 hr capsule Take 240 mg by mouth daily.      . folic acid (FOLVITE) 1 MG tablet Take 1 mg by mouth daily.      Marland Kitchen levothyroxine (SYNTHROID, LEVOTHROID) 50 MCG tablet Take 50-100 mcg by mouth See admin instructions. Take 1 tablets (50 mcg) on Monday and Friday, take 2 tablet (100 mcg) on Sunday, Tuesday, Wednesday, Thursday, Saturday      . methotrexate (RHEUMATREX) 2.5 MG tablet Take 10 mg by mouth 2 (two) times a week. On Tuesdays and Wednesdays      . predniSONE (DELTASONE) 5 MG tablet Take 5 mg by mouth daily.       Assessment: 80 yo female s/p decompressive laminectomy in June 2015 which became infected with drainage in July.  Previous culture from lumbar drainage grew MSSA - pt received Ceftriaxone 2 g IV q24h x6 weeks until the day of this admission and was possibly going to continue on Keflex based CRP/sed rate.  Pt now having fevers with Tmax/24h 101.3, MRSA PCR (-), blood cx, urine cx pending.  Goal of Therapy:  Vancomycin trough level 15-20 mcg/ml  Plan:  Vancomycin 500 mg IV q12h Will treat with goal 15-20 until MRSA ruled out Follow c/s, clinical progression, LOT, renal function and trough at [facility]   Agapito Games, PharmD, BCPS Clinical Pharmacist Pager: [phone] 05/02/2014 9:53 AM

## 2014-05-02 NOTE — Evaluation (Signed)
Occupational Therapy Evaluation Patient Details Name: Marissa Parsons MRN: [id number] DOB: 01-Feb-1929 Today's Date: 05/02/2014    History of Present Illness pt presents with falls, memory deficits, speech deficits, visual deficits and recent L3-5 Lami.  pt being worked up to r/o CVA.  Pt with new left subdural hematoma   Clinical Impression   Pt currently requires total to total +2 for selfcare tasks sit to stand and stand pivot transfer to bedside chair without use of assistive device.  Decreased word finding and delayed ability to process and complete one step commands.  Also noted decreased coordination and functional use of the RUE.  Feel pt will benefit greatly from acute OT services at this time.  Also recommend continued rehab at CIR level for maximal benefit and progress.     Follow Up Recommendations  CIR    Equipment Recommendations  3 in 1 bedside comode;Tub/shower bench    Recommendations for Other Services Rehab consult     Precautions / Restrictions Precautions Precautions: Fall;Back Precaution Booklet Issued: No Precaution Comments: pt with recent L3-5 Lami.   No back brace per family Restrictions Weight Bearing Restrictions: No      Mobility Bed Mobility Overal bed mobility: Needs Assistance Bed Mobility: Supine to Sit     Supine to sit: Total assist;HOB elevated     General bed mobility comments: Pt with decreased initiation of moving her LEs to begin supine to sit.  therapist provided mod to max assist with total assist to bring trunk up from the bed.   Transfers Overall transfer level: Needs assistance   Transfers: Stand Pivot Transfers Sit to Stand: +2 physical assistance;Total assist Stand pivot transfers: Total assist            Balance Overall balance assessment: Needs assistance   Sitting balance-Leahy Scale: Poor Sitting balance - Comments: pt initially only needing min assist for static sitting but regressed to max assist after 5-6  mins. Postural control: Posterior lean;Right lateral lean Standing balance support: Bilateral upper extremity supported Standing balance-Leahy Scale: Zero                              ADL Overall ADL's : Needs assistance/impaired Eating/Feeding: Minimal assistance;Sitting   Grooming: Wash/dry face;Minimal assistance   Upper Body Bathing: Moderate assistance;Minimal assitance;Cueing for sequencing;Sitting   Lower Body Bathing: +2 for physical assistance;Total assistance;Sit to/from stand;Cueing for sequencing   Upper Body Dressing : Moderate assistance;Sitting   Lower Body Dressing: +2 for physical assistance;Total assistance;Sit to/from stand;Cueing for sequencing   Toilet Transfer: Total assistance;BSC (stand pivot simulated to bedside chair)   Toileting- Clothing Manipulation and Hygiene: +2 for physical assistance;Total assistance;Sit to/from stand         General ADL Comments: Pt with increased posterior lean and to the right in sitting.  Initially once transferred to the EOB with total assist pt could maintain sitting with min guard assist but regressed to needing total assist secondary to LOB posteriorly and to the right.  Pt unable to verbally identify simple objects such as a sock and when given 3 choices she still voiced and incorrect answer.  Total assist for sit to stand with pt maintaining trunk and cervical flexion.     Vision Eye Alignment: Within Functional Limits               Additional Comments: Pt did not attempt to track therapists finger and maintained fixation at midline.  Unsure if  she was unable to follow the command or limited occular ROM.  Will continue to assess in treatment.  Pt was able to correctly identify fingers being held up in the central. left, right, fields but was incorrect with the superior field.  No noted scanning to the fingers however with pt instead using her peripheral vision.    Perception Perception Perception  Tested?: No   Praxis Praxis Praxis tested?: Deficits Deficits: Ideomotor Praxis-Other Comments: Pt dropping the top to the soap and also dropping her washcloth when attempting to manipulate it.    Pertinent Vitals/Pain No report of pain.  Oxygen sats 95% on 2Ls decreased to 90% on room air with sitting on the EOB.  HR 107 increasing to 115 with standing     Hand Dominance  right   Extremity/Trunk Assessment Upper Extremity Assessment Upper Extremity Assessment: RUE deficits/detail;LUE deficits/detail RUE Deficits / Details: Pt with AROM shoulder flexion 0-90 degrees AROM all other joints grossly WFLs for AROM however strength 3/5 in the elbow and 3+/5 for grip strength.  Decreased motor planning noted when attempting to manipulate washcloth or remove and place top on the liquid soap bottle. RUE Sensation:  (unable to determine will continue to assess) RUE Coordination: decreased fine motor LUE Deficits / Details: Pt with AROM shoulder flexion 0-90 degrees all other joints AROM WFLs.  Pt with overall strength 3+/5 throughout   Lower Extremity Assessment Lower Extremity Assessment: Defer to PT evaluation   Cervical / Trunk Assessment Cervical / Trunk Assessment: Kyphotic      Cognition Arousal/Alertness: Awake/alert Behavior During Therapy: Flat affect Overall Cognitive Status: Impaired/Different from baseline Area of Impairment: Orientation;Attention;Memory;Following commands;Awareness;Problem solving Orientation Level: Place;Time;Situation Current Attention Level: Focused Memory: Decreased short-term memory Following Commands: Follows one step commands inconsistently;Follows one step commands with increased time Safety/Judgement: Decreased awareness of safety   Problem Solving: Slow processing;Difficulty sequencing;Requires verbal cues General Comments: Pt with decreased ability to process and follow one step instructional commands.  Decreased ability to verbally name objects  or give prior functional level secondary to expressive difficulties.              Home Living Family/patient expects to be discharged to:: Private residence Living Arrangements: Alone Available Help at Discharge: Family;Available PRN/intermittently;Friend(s) Type of Home: House Home Access: Stairs to enter [employer] of Steps: 2 Entrance Stairs-Rails: Can reach both Home Layout: One level     Bathroom Shower/Tub: Producer, television/film/video: Handicapped height     Home Equipment: Environmental consultant - 2 wheels;Shower seat   Additional Comments: pt does not drive due to impaired vision      Prior Functioning/Environment Level of Independence: Independent with assistive device(s)        Comments: Per chart prior to last admit she was Mod I with AD, however pt with speech deficits and ? cognitive deificts, so difficult to determine PLOF.      OT Diagnosis: Generalized weakness;Cognitive deficits;Apraxia;Altered mental status;Blindness and low vision   OT Problem List: Decreased strength;Decreased activity tolerance;Decreased range of motion;Decreased coordination;Impaired vision/perception;Impaired balance (sitting and/or standing);Decreased safety awareness;Cardiopulmonary status limiting activity;Decreased knowledge of use of DME or AE;Impaired UE functional use   OT Treatment/Interventions: Self-care/ADL training;Patient/family education;Balance training;Therapeutic activities;Neuromuscular education;Therapeutic exercise;DME and/or AE instruction;Cognitive remediation/compensation;Visual/perceptual remediation/compensation;Manual therapy    OT Goals(Current goals can be found in the care plan section) Acute Rehab OT Goals Patient Stated Goal: Pt did not state but responded "yes" when therapist asked if it felt good to sit up. OT Goal Formulation:  With patient/family Time For Goal Achievement: 05/16/14 Potential to Achieve Goals: Fair  OT Frequency: Min 2X/week    Barriers to D/C: Decreased caregiver support  Pl lived alone prior to admisssion.          End of Session Nurse Communication: Mobility status  Activity Tolerance: Patient limited by fatigue Patient left: in chair;with family/visitor present;with call bell/phone within reach   Time: 1117-1204 OT Time Calculation (min): 47 min Charges:  OT General Charges $OT Visit: 1 Procedure OT Evaluation $Initial OT Evaluation Tier I: 1 Procedure OT Treatments $Self Care/Home Management : 38-52 mins G-Codes:    Milferd Ansell OTR/L 05/10/14, 12:57 PM

## 2014-05-02 NOTE — Progress Notes (Signed)
Having fever. Lumbar wound does not appear to be progressing, however will expand rocephin to vanc emperically given fever and no other identifiable source at this time.   CXR ordered  U/a clear yesterday Blood cx's pending , drawn 8/5    Alysia Penna, MD 05/02/2014 9:26 AM

## 2014-05-02 NOTE — Progress Notes (Signed)
Physician Daily Progress Note  Subjective: No fever overnight Doing well on [place] O2  Not oriented to place or situation .  But oriented to objects , etc  Able to make sentences today  No focal deficits on exam  Move to tele bed  DNR    Objective: Vital signs in last 24 hours:   Filed Vitals:   05/01/14 1448 05/01/14 2036 05/02/14 0010 05/02/14 0417  BP: 140/58 145/63 126/63 132/51  Pulse: 101 107 106 104  Temp: 99.3 F (37.4 C) 98.4 F (36.9 C) 99.6 F (37.6 C) 98.2 F (36.8 C)  TempSrc: Oral Oral Oral Oral  Resp: 22 18 18 18   Height:      Weight:      SpO2: 96% 96% 93% 94%   Weight change:  Last BM Date: 04/29/14  CBG (last 3)  No results found for this basename: GLUCAP,  in the last 72 hours  Intake/Output from previous day:  Intake/Output Summary (Last 24 hours) at 05/02/14 0752 Last data filed at 05/02/14 0418  Gross per 24 hour  Intake   1010 ml  Output   1500 ml  Net   -490 ml      Physical Exam General appearance:WF in NAD  HEENT:  MMM, no icterus, EOMI Resp:  CTAB, no crackels  Cardio:  RRR, no MRG  GI: soft, non-tender; bowel sounds normal; no masses,  no organomegaly Extremities: no clubbing, cyanosis or edema Neuro : CN II-XII intact grossly . Able to make sentences and name objects. AAOx1  Skin : 3cm purple , nondraining scar present peri spinally w/o any sign of fluctuance   Labs: Basic Metabolic Panel:  Recent Labs Lab 04/29/14 1731 04/30/14 0830 05/01/14 0256 05/02/14 0510  NA 133* 137 137 137  K 3.9 3.7 3.9 3.5*  CL 98 99 97 102  CO2 24 24 27 25   GLUCOSE 120* 146* 101* 93  BUN 14 9 9 8   CREATININE 0.60 0.64 0.67 0.51  CALCIUM 9.6 9.3 9.0 8.4   GFR Estimated Creatinine Clearance: 50.1 ml/min (by C-G formula based on Cr of 0.51). Liver Function Tests:  Recent Labs Lab 04/29/14 1731  AST 64*  ALT 27  ALKPHOS 78  BILITOT 0.2*  PROT 6.1  ALBUMIN 2.7*   No results found for this basename: LIPASE, AMYLASE,  in the last  168 hours No results found for this basename: AMMONIA,  in the last 168 hours Coagulation profile No results found for this basename: INR, PROTIME,  in the last 168 hours  CBC:  Recent Labs Lab 04/29/14 1731 04/30/14 0830 05/01/14 0256 05/02/14 0510  WBC 8.6 8.4 12.3* 10.4  NEUTROABS 6.1  --   --   --   HGB 11.3* 11.7* 12.8 10.9*  HCT 35.7* 35.9* 40.0 33.7*  MCV 98.9 98.6 98.8 97.4  PLT 288 297 314 288   Cardiac Enzymes: No results found for this basename: CKTOTAL, CKMB, CKMBINDEX, TROPONINI,  in the last 168 hours BNP (last 3 results)  Recent Labs  03/24/14 0430  PROBNP 2706.0*   CBG: No results found for this basename: GLUCAP,  in the last 168 hours D-Dimer: No results found for this basename: DDIMER,  in the last 72 hours Hgb A1c: No results found for this basename: HGBA1C,  in the last 72 hours Lipid Profile: No results found for this basename: CHOL, HDL, LDLCALC, TRIG, CHOLHDL, LDLDIRECT,  in the last 72 hours Thyroid function studies:  Recent Labs  04/30/14 0830  TSH  1.720   Anemia work up: No results found for this basename: VITAMINB12, FOLATE, FERRITIN, TIBC, IRON, RETICCTPCT,  in the last 72 hours Sepsis Labs:  Recent Labs Lab 04/29/14 1731 04/29/14 1745 04/30/14 0830 05/01/14 0256 05/02/14 0510  WBC 8.6  --  8.4 12.3* 10.4  LATICACIDVEN  --  1.45  --   --   --    Microbiology Recent Results (from the past 240 hour(s))  CULTURE, BLOOD (ROUTINE X 2)     Status: None   Collection Time    04/29/14  5:31 PM      Result Value Ref Range Status   Specimen Description BLOOD PICC LINE   Final   Special Requests BOTTLES DRAWN AEROBIC AND ANAEROBIC R5CC B 8CC   Final   Culture  Setup Time     Final   Value: 04/29/2014 22:16     Performed at [facility]   Culture     Final   Value:        BLOOD CULTURE RECEIVED NO GROWTH TO DATE CULTURE WILL BE HELD FOR 5 DAYS BEFORE ISSUING A FINAL NEGATIVE REPORT     Performed at [facility]    Report Status PENDING   Incomplete  CULTURE, BLOOD (ROUTINE X 2)     Status: None   Collection Time    04/29/14  5:38 PM      Result Value Ref Range Status   Specimen Description BLOOD LEFT ARM   Final   Special Requests BOTTLES DRAWN AEROBIC AND ANAEROBIC 6CC   Final   Culture  Setup Time     Final   Value: 04/29/2014 22:17     Performed at [facility]   Culture     Final   Value:        BLOOD CULTURE RECEIVED NO GROWTH TO DATE CULTURE WILL BE HELD FOR 5 DAYS BEFORE ISSUING A FINAL NEGATIVE REPORT     Performed at [facility]   Report Status PENDING   Incomplete  URINE CULTURE     Status: None   Collection Time    04/29/14  5:50 PM      Result Value Ref Range Status   Specimen Description URINE, CATHETERIZED   Final   Special Requests NONE   Final   Culture  Setup Time     Final   Value: 04/29/2014 18:17     Performed at [facility] Count     Final   Value: NO GROWTH     Performed at [facility]   Culture     Final   Value: NO GROWTH     Performed at [facility]   Report Status 04/30/2014 FINAL   Final  MRSA PCR SCREENING     Status: None   Collection Time    04/29/14 10:45 PM      Result Value Ref Range Status   MRSA by PCR NEGATIVE  NEGATIVE Final   Comment:            The GeneXpert MRSA Assay (FDA     approved for NASAL specimens     only), is one component of a     comprehensive MRSA colonization     surveillance program. It is not     intended to diagnose MRSA     infection nor to guide or     monitor treatment for     MRSA infections.     Marland Kitchen  bisoprolol-hydrochlorothiazide  1 tablet Oral Daily  . cefTRIAXone (ROCEPHIN) IVPB 2 gram/50 mL D5W (Pyxis)  2 g Intravenous Q24H  . diltiazem  240 mg Oral Daily  . folic acid  1 mg Intravenous Daily  . lacosamide  100 mg Oral BID  . levothyroxine  100 mcg Oral Once per day on Sun Tue Wed Thu Sat  . [START ON 05/03/2014] levothyroxine  50 mcg Oral Once per day on Mon  Fri  . methotrexate  10 mg Oral Once per day on Tue Wed  . predniSONE  5 mg Oral Q breakfast  . saccharomyces boulardii  250 mg Oral BID   Continuous Infusions: . 0.9 % NaCl with KCl 20 mEq / L       Assessment/Plan:   Aphasia - likely 2/2 cortical irritation from the SDH, which has improved since original fall. Repeat the head CT on 8/10 or before if clinically indicated to ensure stability of bleed. On AEDs meds and being followed by neuro to treat possible seizure activity 2/2 bleed. Improving. NSG has signed off given SDH stability . CIR pending for placement . PT/OT daily   Prior MRSA infection of perispinal lumbar region - on rocephin q24hr via her PICC . Per NSG notes ,  transition the rocephin to keflex 500 TID starting on friday x 4 weeks if stable . Continues to be no active drainage from sight . If decompensates further, consider expanding coverage to  vanc   Leukocytosis / low grade fever - improved. pancultured . NSG does not feel this is 2/2 her back wound as it is not draining   Aspiration - per speech . Will need continued therapy . Improving so changed meds back to PO   Hypothyroid - controlled   PPx - SCDs   Dispo - to CIR when stable . To tele bed today   Spoke w/ brother who is next of kin and she does not have formal HCPOA. He states he is her only next of kin that she actively talks w/. He states she would want to be DNR / DNI and this order will be placed.     LOS: 3 days   Brigit Doke 05/02/2014, 7:52 AM

## 2014-05-03 ENCOUNTER — Inpatient Hospital Stay (HOSPITAL_COMMUNITY): Payer: Medicare HMO

## 2014-05-03 DIAGNOSIS — R197 Diarrhea, unspecified: Secondary | ICD-10-CM

## 2014-05-03 LAB — BASIC METABOLIC PANEL
Anion gap: 11 (ref 5–15)
BUN: 12 mg/dL (ref 6–23)
CO2: 22 meq/L (ref 19–32)
CREATININE: 0.54 mg/dL (ref 0.50–1.10)
Calcium: 8.4 mg/dL (ref 8.4–10.5)
Chloride: 103 mEq/L (ref 96–112)
GFR calc Af Amer: >90 mL/min (ref >90)
GFR calc non Af Amer: 85 mL/min — ABNORMAL LOW (ref >90)
Glucose, Bld: 93 mg/dL (ref 70–99)
POTASSIUM: 3.8 meq/L (ref 3.7–5.3)
Sodium: 136 mEq/L — ABNORMAL LOW (ref 137–147)

## 2014-05-03 LAB — CBC
HEMATOCRIT: 33.8 % — AB (ref 36.0–46.0)
Hemoglobin: 10.8 g/dL — ABNORMAL LOW (ref 12.0–15.0)
MCH: 31.1 pg (ref 26.0–34.0)
MCHC: 32 g/dL (ref 30.0–36.0)
MCV: 97.4 fL (ref 78.0–100.0)
Platelets: 287 10*3/uL (ref 150–400)
RBC: 3.47 MIL/uL — AB (ref 3.87–5.11)
RDW: 13.7 % (ref 11.5–15.5)
WBC: 12.6 10*3/uL — AB (ref 4.0–10.5)

## 2014-05-03 LAB — GLUCOSE, CAPILLARY
GLUCOSE-CAPILLARY: 137 mg/dL — AB (ref 70–99)
GLUCOSE-CAPILLARY: 172 mg/dL — AB (ref 70–99)
GLUCOSE-CAPILLARY: 192 mg/dL — AB (ref 70–99)

## 2014-05-03 MED ORDER — FOLIC ACID 1 MG PO TABS
1.0000 mg | ORAL_TABLET | Freq: Every day | ORAL | Status: DC
  Administered 2014-05-04: 1 mg via ORAL
  Filled 2014-05-03: qty 1

## 2014-05-03 MED ORDER — DEXTROSE 5 % IV SOLN
1.0000 g | INTRAVENOUS | Status: DC
  Filled 2014-05-03: qty 10

## 2014-05-03 MED ORDER — VANCOMYCIN 50 MG/ML ORAL SOLUTION
125.0000 mg | Freq: Four times a day (QID) | ORAL | Status: DC
  Administered 2014-05-03 – 2014-05-06 (×8): 125 mg via ORAL
  Filled 2014-05-03 (×16): qty 2.5

## 2014-05-03 NOTE — Progress Notes (Addendum)
Physician Daily Progress Note  Subjective: Some pharyngeal gurgling this AM Period of AMS overnight  I/O stable, weight stable  Able to name some objects this AM  Still only AAOx1     Objective: Vital signs in last 24 hours:   Filed Vitals:   05/03/14 0500 05/03/14 0655 05/03/14 0700 05/03/14 1007  BP:  123/45 125/47 117/53  Pulse:  64 81 89  Temp:   97.8 F (36.6 C) 98.3 F (36.8 C)  TempSrc:   Oral Oral  Resp:  16 18 18   Height:      Weight: 134 lb (60.782 kg)     SpO2:  96% 95% 98%   Weight change:  Last BM Date: 05/02/14  CBG (last 3)  No results found for this basename: GLUCAP,  in the last 72 hours  Intake/Output from previous day:  Intake/Output Summary (Last 24 hours) at 05/03/14 1314 Last data filed at 05/03/14 1238  Gross per 24 hour  Intake 483.75 ml  Output      0 ml  Net 483.75 ml      Physical Exam General appearance:WF in NAD  HEENT:  MMM, no icterus, EOMI Resp:  CTAB, no crackels  Cardio:  RRR, no MRG  GI: soft, non-tender; bowel sounds normal; no masses,  no organomegaly Extremities: no clubbing, cyanosis or edema Neuro : CN II-XII intact grossly . Able to make sentences and name objects. AAOx1  Skin : 3cm purple , nondraining scar present peri spinally w/o any sign of fluctuance   Labs: Basic Metabolic Panel:  Recent Labs Lab 04/29/14 1731 04/30/14 0830 05/01/14 0256 05/02/14 0510 05/03/14 0503  NA 133* 137 137 137 136*  K 3.9 3.7 3.9 3.5* 3.8  CL 98 99 97 102 103  CO2 24 24 27 25 22   GLUCOSE 120* 146* 101* 93 93  BUN 14 9 9 8 12   CREATININE 0.60 0.64 0.67 0.51 0.54  CALCIUM 9.6 9.3 9.0 8.4 8.4   GFR Estimated Creatinine Clearance: 51.1 ml/min (by C-G formula based on Cr of 0.54). Liver Function Tests:  Recent Labs Lab 04/29/14 1731  AST 64*  ALT 27  ALKPHOS 78  BILITOT 0.2*  PROT 6.1  ALBUMIN 2.7*   No results found for this basename: LIPASE, AMYLASE,  in the last 168 hours No results found for this basename:  AMMONIA,  in the last 168 hours Coagulation profile No results found for this basename: INR, PROTIME,  in the last 168 hours  CBC:  Recent Labs Lab 04/29/14 1731 04/30/14 0830 05/01/14 0256 05/02/14 0510 05/03/14 0503  WBC 8.6 8.4 12.3* 10.4 12.6*  NEUTROABS 6.1  --   --   --   --   HGB 11.3* 11.7* 12.8 10.9* 10.8*  HCT 35.7* 35.9* 40.0 33.7* 33.8*  MCV 98.9 98.6 98.8 97.4 97.4  PLT 288 297 314 288 287   Cardiac Enzymes: No results found for this basename: CKTOTAL, CKMB, CKMBINDEX, TROPONINI,  in the last 168 hours BNP (last 3 results)  Recent Labs  03/24/14 0430  PROBNP 2706.0*   CBG: No results found for this basename: GLUCAP,  in the last 168 hours D-Dimer: No results found for this basename: DDIMER,  in the last 72 hours Hgb A1c: No results found for this basename: HGBA1C,  in the last 72 hours Lipid Profile: No results found for this basename: CHOL, HDL, LDLCALC, TRIG, CHOLHDL, LDLDIRECT,  in the last 72 hours Thyroid function studies: No results found for this basename: TSH,  T4TOTAL, FREET3, T3FREE, THYROIDAB,  in the last 72 hours Anemia work up: No results found for this basename: VITAMINB12, FOLATE, FERRITIN, TIBC, IRON, RETICCTPCT,  in the last 72 hours Sepsis Labs:  Recent Labs Lab 04/29/14 1745 04/30/14 0830 05/01/14 0256 05/02/14 0510 05/03/14 0503  WBC  --  8.4 12.3* 10.4 12.6*  LATICACIDVEN 1.45  --   --   --   --    Microbiology Recent Results (from the past 240 hour(s))  CULTURE, BLOOD (ROUTINE X 2)     Status: None   Collection Time    04/29/14  5:31 PM      Result Value Ref Range Status   Specimen Description BLOOD PICC LINE   Final   Special Requests BOTTLES DRAWN AEROBIC AND ANAEROBIC R5CC B 8CC   Final   Culture  Setup Time     Final   Value: 04/29/2014 22:16     Performed at [facility]   Culture     Final   Value:        BLOOD CULTURE RECEIVED NO GROWTH TO DATE CULTURE WILL BE HELD FOR 5 DAYS BEFORE ISSUING A FINAL  NEGATIVE REPORT     Performed at [facility]   Report Status PENDING   Incomplete  CULTURE, BLOOD (ROUTINE X 2)     Status: None   Collection Time    04/29/14  5:38 PM      Result Value Ref Range Status   Specimen Description BLOOD LEFT ARM   Final   Special Requests BOTTLES DRAWN AEROBIC AND ANAEROBIC 6CC   Final   Culture  Setup Time     Final   Value: 04/29/2014 22:17     Performed at [facility]   Culture     Final   Value:        BLOOD CULTURE RECEIVED NO GROWTH TO DATE CULTURE WILL BE HELD FOR 5 DAYS BEFORE ISSUING A FINAL NEGATIVE REPORT     Performed at [facility]   Report Status PENDING   Incomplete  URINE CULTURE     Status: None   Collection Time    04/29/14  5:50 PM      Result Value Ref Range Status   Specimen Description URINE, CATHETERIZED   Final   Special Requests NONE   Final   Culture  Setup Time     Final   Value: 04/29/2014 18:17     Performed at [facility] Count     Final   Value: NO GROWTH     Performed at [facility]   Culture     Final   Value: NO GROWTH     Performed at [facility]   Report Status 04/30/2014 FINAL   Final  MRSA PCR SCREENING     Status: None   Collection Time    04/29/14 10:45 PM      Result Value Ref Range Status   MRSA by PCR NEGATIVE  NEGATIVE Final   Comment:            The GeneXpert MRSA Assay (FDA     approved for NASAL specimens     only), is one component of a     comprehensive MRSA colonization     surveillance program. It is not     intended to diagnose MRSA     infection nor to guide or     monitor treatment for  MRSA infections.  URINE CULTURE     Status: None   Collection Time    05/01/14  9:06 AM      Result Value Ref Range Status   Specimen Description URINE, CATHETERIZED   Final   Special Requests rocephin Immunocompromised   Final   Culture  Setup Time     Final   Value: 05/01/2014 16:08     Performed at [facility]  Count     Final   Value: NO GROWTH     Performed at [facility]   Culture     Final   Value: NO GROWTH     Performed at [facility]   Report Status 05/02/2014 FINAL   Final  CULTURE, BLOOD (ROUTINE X 2)     Status: None   Collection Time    05/01/14 10:10 AM      Result Value Ref Range Status   Specimen Description BLOOD LEFT ARM   Final   Special Requests BOTTLES DRAWN AEROBIC AND ANAEROBIC 10CC   Final   Culture  Setup Time     Final   Value: 05/01/2014 16:10     Performed at [facility]   Culture     Final   Value:        BLOOD CULTURE RECEIVED NO GROWTH TO DATE CULTURE WILL BE HELD FOR 5 DAYS BEFORE ISSUING A FINAL NEGATIVE REPORT     Performed at [facility]   Report Status PENDING   Incomplete  CULTURE, BLOOD (ROUTINE X 2)     Status: None   Collection Time    05/01/14 10:20 AM      Result Value Ref Range Status   Specimen Description BLOOD LEFT HAND   Final   Special Requests BOTTLES DRAWN AEROBIC ONLY 3CC   Final   Culture  Setup Time     Final   Value: 05/01/2014 16:09     Performed at [facility]   Culture     Final   Value:        BLOOD CULTURE RECEIVED NO GROWTH TO DATE CULTURE WILL BE HELD FOR 5 DAYS BEFORE ISSUING A FINAL NEGATIVE REPORT     Performed at [facility]   Report Status PENDING   Incomplete  CLOSTRIDIUM DIFFICILE BY PCR     Status: Abnormal   Collection Time    05/02/14 11:56 AM      Result Value Ref Range Status   C difficile by pcr POSITIVE (*) NEGATIVE Final   Comment: CRITICAL RESULT CALLED TO, READ BACK BY AND VERIFIED WITH:     EASON,K RN 1337 03/04/14 LEONARD,A     . bisoprolol-hydrochlorothiazide  1 tablet Oral Daily  . cefTRIAXone (ROCEPHIN)  IV  1 g Intravenous Q24H  . diltiazem  240 mg Oral Daily  . [START ON 05/04/2014] folic acid  1 mg Oral Daily  . lacosamide  100 mg Oral BID  . levothyroxine  100 mcg Oral Once per day on Sun Tue Wed Thu Sat  . levothyroxine  50 mcg Oral Once per  day on Mon Fri  . methotrexate  10 mg Oral Once per day on Tue Wed  . predniSONE  5 mg Oral Q breakfast  . saccharomyces boulardii  250 mg Oral BID  . vancomycin  125 mg Oral 4 times per day   Continuous Infusions: . 0.9 % NaCl with KCl 20 mEq / L 75 mL/hr  at 05/03/14 0117     Assessment/Plan:  C diff - on PO vanc x 2 weeks per ID. Stop date 8/20. Given this is likely 2/2 rocephin use over the past 4 weeks, and ID feels the lumbar infection has likely been treated at this point, will stop all IV abx to allow treatment of the c diff effectively .   Aphasia/AMS  - likely 2/2 cortical irritation from the SDH, which has improved since original fall. Repeat the head CT today showed stability w/ natural , expected evolution of the SDH.  Plan per neuro, repeating EEG today to ensure no continued epileptic activity. On AEDs per their recs.   Prior MRSA infection of perispinal lumbar region -per NSG note today, given we now have complications from the rocephin (c diff) and 6 weeks of IV abx has likely appropriately treated the prior MRSA infection, we will stop all abx at this time and remove PICC line prior to discharge. This is all per ID recs. I appreciate their assistance in this case.    fever - improved   Leukocytosis - 2/2 cdiff being treated as above   Aspiration - per speech . They will reassess today given gurgling on exam. Will need continued speech therapy . Diet per speech   Hypothyroid - controlled   PPx - SCDs   Dispo - to CIR vs SNF when stable .    Spoke w/ brother who is next of kin and she does not have formal HCPOA. He states he is her only next of kin that she actively talks w/. He states she would want to be DNR / DNI and this order will be placed.   Attempted to contact brother today. Received no answer. Case checked out to Dr Felipa Eth for weekend rounding.     LOS: 4 days   Kasie Leccese 05/03/2014, 1:14 PM

## 2014-05-03 NOTE — Progress Notes (Signed)
Routine EEG completed, results pending. 

## 2014-05-03 NOTE — Procedures (Signed)
History: 80 year old female with left-sided subdural hematoma and seizures currently with decreased mental status  Sedation: None  Technique: This is a 17 channel routine scalp EEG performed at the bedside with bipolar and monopolar montages arranged in accordance to the international 10/20 system of electrode placement. One channel was dedicated to EKG recording.    Background: The predominance this EEG was recorded during sleep with symmetric sleep structures. There are occasional left hemispheric sharp waves. With stimulation, the patient arouses with an increase in faster fragments these including beta activity. There does continue to be greater slow activity as well during wakefulness. The posterior rhythm giving 7 Hz is seen  Photic stimulation: Physiologic driving is not performed  EEG Abnormalities: 1) left-sided sharp waves  2) generalized slow activity 3) slow PDR  Clinical Interpretation: This EEG is consistent with a left hemispheric seizure focus in the setting of a generalized nonspecific cervical dysfunction.. There was no seizure recorded on this study.   Ritta Slot, MD [facility] [phone]  If 7pm- 7am, please page neurology on call as listed in AMION.

## 2014-05-03 NOTE — Progress Notes (Signed)
Subjective: Per nursing, was not following commands well overnight.   Exam: Filed Vitals:   05/03/14 0700  BP: 125/47  Pulse: 81  Temp: 97.8 F (36.6 C)  Resp: 18   Gen: In bed, NAD MS: Lethargic, but able to be roused with mild sitmulation and will answer her name and some simple yes/no questions. She follows commands to show thumbs on each side.  CN: Pupils equal round and reactive to light, blinks to eyelid stimulation bilaterally.  Motor: Moves all extremities well Sensory: Intact to light touch  CT head - SDH is stable.   Impression: 80 year old female with subdural hematoma and aphasia. Aphasia is likely secondary to cortical irritation from blood. I suspect her worsening in the setting of recent fever and increasing white count is due to infection, and her CRP is elevated. There was apparently a period overnight where she did not follow commands, and a focal seizure with her current state being post-ictal would be possible, though I think less likely. If she has further episodes of not following commands, will further increase vimpat.    Recommendations: 1) continue vimpat at 100 mg twice a day 2) repeat EEG today.  3) fever/infection workup per ID.    Ritta Slot, MD [facility] [phone]  If 7pm- 7am, please page neurology on call as listed in AMION.

## 2014-05-03 NOTE — Care Management Note (Addendum)
  Page 1 of 1   05/06/2014     11:26:43 AM CARE MANAGEMENT NOTE 05/06/2014  Patient:  ORTHA, METTS   Account Number:  [id number]  Date Initiated:  04/30/2014  Documentation initiated by:  Marvetta Gibbons  Subjective/Objective Assessment:   Pt admitted s/p fall with SDH- aphasia-     Action/Plan:   PTA pt lived at home- was active with [facility] for [facility]- (iv abx) RN   Anticipated DC Date:     Anticipated DC Plan:  [place]  CM consult      [facility] Choice  Resumption Of Svcs/PTA Provider   Choice offered to / List presented to:             Status of service:  In process, will continue to follow Medicare Important Message given?  YES (If response is "NO", the following Medicare IM given date fields will be blank) Date Medicare IM given:  05/03/2014 Medicare IM given by:  Lorne Skeens Date Additional Medicare IM given:  05/06/2014 Additional Medicare IM given by:  Lorne Skeens  Discharge Disposition:    Per UR Regulation:  Reviewed for med. necessity/level of care/duration of stay  If discussed at [place] of Stay Meetings, dates discussed:    Comments:  05/06/14 [place], MSN, CM- Met with patient to provide additional Medicare IM letter.   05/03/14 [place], MSN, CM- Met with patient to provide Medicare IM letter.

## 2014-05-03 NOTE — Progress Notes (Signed)
Occupational Therapy Treatment Patient Details Name: Marissa Parsons MRN: [id number] DOB: Feb 21, 1929 Today's Date: 05/03/2014    History of present illness Marissa Gottshall. Parsons is an [name] pt presents with falls, memory deficits, speech deficits, visual deficits and recent L3-5 Lami.  Pt found to have lt subdural hematoma.   OT comments  Pt seen today for functional mobility and ADLs. Pt was lethargic this date with wet sounding cough. Assisted pt in EOB sitting for increased efficiency with clearing chest and to increase trunk control for EOB ADLs. Pt continues to have difficulty following commands and presents with decreased cognitive status. Pt would continue to benefit from skilled OT to work on ADL tasks while sitting EOB.    Follow Up Recommendations  CIR    Equipment Recommendations  3 in 1 bedside comode;Tub/shower bench       Precautions / Restrictions Precautions Precautions: Fall;Back Precaution Comments: Laminectomy on 03/06/14. Restrictions Weight Bearing Restrictions: No       Mobility Bed Mobility Overal bed mobility: Needs Assistance Bed Mobility: Supine to Sit;Sit to Supine     Supine to sit: Total assist Sit to supine: Total assist   General bed mobility comments: Assist to move legs off of bed and elevate trunk into sitting and to lower trunk and bring legs back up into bed to supine.  Transfers                 General transfer comment: Transfer not attempted due to lethargy and pt safety.     Balance Overall balance assessment: Needs assistance Sitting-balance support: Single extremity supported;Feet supported Sitting balance-Leahy Scale: Poor Sitting balance - Comments: Pt sat EOB x 10 minutes with OT support. Pt able to assist with RUE on bed rail.  Postural control: Posterior lean                         ADL Overall ADL's : Needs assistance/impaired     Grooming: Moderate assistance;Cueing for sequencing (in supported  EOB sitting) Grooming Details (indicate cue type and reason): pt with Bil UE weakness and difficulty following commands. Pt was able to bring cloth near her face with LUE and required assistance. Pt was supported in EOB sitting.                               General ADL Comments: Pt is lethargic and minimally participative during OT session, however was able to use RUE to assist with posture in EOB sitting. At this time, pt requires assist for all ADLs due to lethargy and difficulty following commands.          Perception         Cognition  Arousal/Alertness: Lethargic Behavior During Therapy: Flat affect Overall Cognitive Status: Impaired/Different from baseline Area of Impairment: Orientation;Attention;Memory;Following commands;Safety/judgement;Awareness;Problem solving Orientation Level: Disoriented to;Place;Time;Situation Current Attention Level: Focused Memory: Decreased recall of precautions;Decreased short-term memory  Following Commands: Follows one step commands inconsistently;Follows one step commands with increased time Safety/Judgement: Decreased awareness of safety;Decreased awareness of deficits Awareness: Intellectual Problem Solving: Slow processing;Difficulty sequencing;Requires verbal cues;Decreased initiation General Comments: Pt with expressive deficits and difficult to assess cognition. Pt also very lethargic and difficult to manitain alertness.                 Pertinent Vitals/ Pain       Pain Assessment: Faces Pain Score: 0-No pain Faces Pain Scale:  No hurt         Frequency Min 2X/week     Progress Toward Goals  OT Goals(current goals can now be found in the care plan section)  Progress towards OT goals: Progressing toward goals (slowly)     Plan Discharge plan remains appropriate       End of Session     Activity Tolerance Patient limited by lethargy   Patient Left in bed;with call bell/phone within reach;with  family/visitor present   Nurse Communication Mobility status        Time: 4098-1191 OT Time Calculation (min): 33 min  Charges: OT General Charges $OT Visit: 1 Procedure OT Treatments $Self Care/Home Management : 8-22 mins $Therapeutic Activity: 8-22 mins  Marissa Parsons [phone] 05/03/2014, 6:19 PM

## 2014-05-03 NOTE — Progress Notes (Signed)
Patient ID: Marissa Parsons, female   DOB: May 18, 1929, 80 y.o.   MRN: [id number]         [facility]    Date of Admission:  04/29/2014            Day 2 IV vancomycin              Day 1 oral metronidazole  Principal Problem:   Fever Active Problems:   Macular degeneration   Lumbar spinal stenosis   S/P lumbar laminectomy   Wound infection after surgery   Subdural hematoma   Aphasia   . bisoprolol-hydrochlorothiazide  1 tablet Oral Daily  . diltiazem  240 mg Oral Daily  . folic acid  1 mg Intravenous Daily  . lacosamide  100 mg Oral BID  . levothyroxine  100 mcg Oral Once per day on Sun Tue Wed Thu Sat  . levothyroxine  50 mcg Oral Once per day on Mon Fri  . methotrexate  10 mg Oral Once per day on Tue Wed  . metroNIDAZOLE  500 mg Oral 3 times per day  . predniSONE  5 mg Oral Q breakfast  . saccharomyces boulardii  250 mg Oral BID  . vancomycin  500 mg Intravenous Q12H    Subjective:  she denies back pain.   Objective: Temp:  [97.8 F (36.6 C)-98.9 F (37.2 C)] 98.3 F (36.8 C) (08/07 1007) Pulse Rate:  [64-107] 89 (08/07 1007) Resp:  [16-29] 18 (08/07 1007) BP: (108-126)/(33-75) 117/53 mmHg (08/07 1007) SpO2:  [69 %-98 %] 98 % (08/07 1007) Weight:  [134 lb (60.782 kg)] 134 lb (60.782 kg) (08/07 0500)  General:  she is resting quietly in bed Skin:  PICC site appears normal Lungs:  clear Cor:  regular S1 and S2 with no murmur Abdomen:  soft and nontender lumbar incision: Healed  Lab Results Lab Results  Component Value Date   WBC 12.6* 05/03/2014   HGB 10.8* 05/03/2014   HCT 33.8* 05/03/2014   MCV 97.4 05/03/2014   PLT 287 05/03/2014    Lab Results  Component Value Date   CREATININE 0.54 05/03/2014   BUN 12 05/03/2014   NA 136* 05/03/2014   K 3.8 05/03/2014   CL 103 05/03/2014   CO2 22 05/03/2014    Lab Results  Component Value Date   ALT 27 04/29/2014   AST 64* 04/29/2014   ALKPHOS 78 04/29/2014   BILITOT 0.2* 04/29/2014       Microbiology: Recent Results (from the past 240 hour(s))  CULTURE, BLOOD (ROUTINE X 2)     Status: None   Collection Time    04/29/14  5:31 PM      Result Value Ref Range Status   Specimen Description BLOOD PICC LINE   Final   Special Requests BOTTLES DRAWN AEROBIC AND ANAEROBIC R5CC B 8CC   Final   Culture  Setup Time     Final   Value: 04/29/2014 22:16     Performed at [facility]   Culture     Final   Value:        BLOOD CULTURE RECEIVED NO GROWTH TO DATE CULTURE WILL BE HELD FOR 5 DAYS BEFORE ISSUING A FINAL NEGATIVE REPORT     Performed at [facility]   Report Status PENDING   Incomplete  CULTURE, BLOOD (ROUTINE X 2)     Status: None   Collection Time    04/29/14  5:38 PM  Result Value Ref Range Status   Specimen Description BLOOD LEFT ARM   Final   Special Requests BOTTLES DRAWN AEROBIC AND ANAEROBIC [facility]   Final   Culture  Setup Time     Final   Value: 04/29/2014 22:17     Performed at [facility]   Culture     Final   Value:        BLOOD CULTURE RECEIVED NO GROWTH TO DATE CULTURE WILL BE HELD FOR 5 DAYS BEFORE ISSUING A FINAL NEGATIVE REPORT     Performed at [facility]   Report Status PENDING   Incomplete  URINE CULTURE     Status: None   Collection Time    04/29/14  5:50 PM      Result Value Ref Range Status   Specimen Description URINE, CATHETERIZED   Final   Special Requests NONE   Final   Culture  Setup Time     Final   Value: 04/29/2014 18:17     Performed at [facility] Count     Final   Value: NO GROWTH     Performed at [facility]   Culture     Final   Value: NO GROWTH     Performed at [facility]   Report Status 04/30/2014 FINAL   Final  MRSA PCR SCREENING     Status: None   Collection Time    04/29/14 10:45 PM      Result Value Ref Range Status   MRSA by PCR NEGATIVE  NEGATIVE Final   Comment:            The GeneXpert MRSA Assay (FDA     approved for NASAL specimens      only), is one component of a     comprehensive MRSA colonization     surveillance program. It is not     intended to diagnose MRSA     infection nor to guide or     monitor treatment for     MRSA infections.  URINE CULTURE     Status: None   Collection Time    05/01/14  9:06 AM      Result Value Ref Range Status   Specimen Description URINE, CATHETERIZED   Final   Special Requests rocephin Immunocompromised   Final   Culture  Setup Time     Final   Value: 05/01/2014 16:08     Performed at [facility] Count     Final   Value: NO GROWTH     Performed at [facility]   Culture     Final   Value: NO GROWTH     Performed at [facility]   Report Status 05/02/2014 FINAL   Final  CULTURE, BLOOD (ROUTINE X 2)     Status: None   Collection Time    05/01/14 10:10 AM      Result Value Ref Range Status   Specimen Description BLOOD LEFT ARM   Final   Special Requests BOTTLES DRAWN AEROBIC AND ANAEROBIC 10CC   Final   Culture  Setup Time     Final   Value: 05/01/2014 16:10     Performed at [facility]   Culture     Final   Value:        BLOOD CULTURE RECEIVED NO GROWTH TO DATE CULTURE WILL BE HELD FOR 5 DAYS BEFORE ISSUING A FINAL  NEGATIVE REPORT     Performed at [facility]   Report Status PENDING   Incomplete  CULTURE, BLOOD (ROUTINE X 2)     Status: None   Collection Time    05/01/14 10:20 AM      Result Value Ref Range Status   Specimen Description BLOOD LEFT HAND   Final   Special Requests BOTTLES DRAWN AEROBIC ONLY 3CC   Final   Culture  Setup Time     Final   Value: 05/01/2014 16:09     Performed at [facility]   Culture     Final   Value:        BLOOD CULTURE RECEIVED NO GROWTH TO DATE CULTURE WILL BE HELD FOR 5 DAYS BEFORE ISSUING A FINAL NEGATIVE REPORT     Performed at [facility]   Report Status PENDING   Incomplete  CLOSTRIDIUM DIFFICILE BY PCR     Status: Abnormal   Collection Time    05/02/14  11:56 AM      Result Value Ref Range Status   C difficile by pcr POSITIVE (*) NEGATIVE Final   Comment: CRITICAL RESULT CALLED TO, READ BACK BY AND VERIFIED WITH:     EASON,K RN [id number] 03/04/14 LEONARD,A    Studies/Results: Ct Head Wo Contrast  05/03/2014   CLINICAL DATA:  Traumatic subdural hemorrhage with worsening somnolence  EXAM: CT HEAD WITHOUT CONTRAST  TECHNIQUE: Contiguous axial images were obtained from the base of the skull through the vertex without intravenous contrast.  COMPARISON:  Prior head CT 04/29/2014  FINDINGS: Evolution of left subdural hemorrhage. The hemorrhage has spread out over the left temporal cortex. No significant new mass effect or progression of the 3 mm of midline shift. Global atrophy and advanced chronic microvascular ischemic white matter disease are also similar compared to prior. No evidence of acute infarct, new hemorrhage, mass or mass effect.  IMPRESSION: 1. Stable left subdural hemorrhage with expected evolution compared to 04/29/2014. 2. Stable 3 mm left-to-right midline shift. 3. No new acute intracranial abnormality.   Electronically Signed   By: Malachy Moan [name]   On: 05/03/2014 08:00   Dg Chest Port 1v Same Day  05/02/2014   CLINICAL DATA:  Fever  EXAM: PORTABLE CHEST - 1 VIEW SAME DAY  COMPARISON:  05/01/2014  FINDINGS: Cardiomediastinal silhouette is stable. Question small right pleural effusion. Hazy right basilar atelectasis or infiltrate. Stable right arm PICC line position. No pulmonary edema.  IMPRESSION: Small right pleural effusion. Hazy right basilar atelectasis or infiltrate. No pulmonary edema.   Electronically Signed   By: Natasha Mead [name]   On: 05/02/2014 14:26    Assessment:  I suspect that her lumbar infection has been cured after completing 6 weeks of therapy yesterday. Her recent fever is likely due to C. difficile colitis caused by her ceftriaxone therapy. Although her diarrhea is not severe I favor using oral vancomycin which is now  felt to be more effective than oral metronidazole.  Plan: 1. Discontinue IV vancomycin 2. Change metronidazole to oral vancomycin and treat for 2 weeks 3. Recommend removing PIC before discharge 4. Please call Dr. Daiva Eves [phone] for any infectious disease questions this weekend  Cliffton Asters, MD [facility] [phone] pager   [phone] cell 05/03/2014, 11:45 AM

## 2014-05-03 NOTE — Progress Notes (Signed)
Speech Language Pathology Treatment: Dysphagia  Patient Details Name: Marissa Parsons MRN: [id number] DOB: Feb 12, 1929 Today's Date: 05/03/2014 Time: 2549-8264 SLP Time Calculation (min): 15 min  Assessment / Plan / Recommendation Clinical Impression  F/u for dysphagia: pt with deterioration in swallow function since 8/5 evaluation - she is more lethargic, with rhonchi in lungs per RN and eating minimally.  Swallow function characterized by increased oral spillage, reduced attention to bolus, and immediate, wet cough response after consumption of thin liquids.  Congestion palpable /audible at level of larynx. Pt presents with multiple signs of aspiration today.  Discussed with RN; recommend NPO for tonight except meds crushed with puree.  Will proceed with MBS next date to determine safest PO diet and potential benefit of diet modification.  Verbal orders received from Dr. Link Snuffer.     HPI HPI: 80 y.o. female who is unable to provide history due to being both expressive and receptively aphasic. Per notes, patient sustained a fall back in July in which she had a small left sided SDH with minimal mass effect. Patient returned to ED on 8/3 due to family member noting she was slower to respond, that her memory was not normal, that she was not able to remember names of people she knew well. On arrival to ED initial CT showed a improved SDH; MRI - Persistent left subdural hematoma, widespread in the left hemisphere. MRI appearance today is similar to the CT appearance.    Pertinent Vitals    SLP Plan  Continue with current plan of care;MBS    Recommendations Diet recommendations: NPO Medication Administration: Crushed with puree Compensations: Small sips/bites;Check for pocketing Postural Changes and/or Swallow Maneuvers: Seated upright 90 degrees              Oral Care Recommendations: Oral care BID Plan: Continue with current plan of care;MBS    Marissa Parsons, [place] CCC/SLP Pager  [phone]      Marissa Parsons 05/03/2014, 3:26 PM

## 2014-05-04 ENCOUNTER — Inpatient Hospital Stay (HOSPITAL_COMMUNITY): Payer: Medicare HMO

## 2014-05-04 LAB — GLUCOSE, CAPILLARY
Glucose-Capillary: 142 mg/dL — ABNORMAL HIGH (ref 70–99)
Glucose-Capillary: 121 mg/dL — ABNORMAL HIGH (ref 70–99)
Glucose-Capillary: 150 mg/dL — ABNORMAL HIGH (ref 70–99)
Glucose-Capillary: 116 mg/dL — ABNORMAL HIGH (ref 70–99)
Glucose-Capillary: 113 mg/dL — ABNORMAL HIGH (ref 70–99)

## 2014-05-04 LAB — BASIC METABOLIC PANEL
ANION GAP: 11 (ref 5–15)
BUN: 14 mg/dL (ref 6–23)
CALCIUM: 8.7 mg/dL (ref 8.4–10.5)
CO2: 24 meq/L (ref 19–32)
CREATININE: 0.45 mg/dL — AB (ref 0.50–1.10)
Chloride: 104 mEq/L (ref 96–112)
GFR calc Af Amer: >90 mL/min (ref >90)
Glucose, Bld: 125 mg/dL — ABNORMAL HIGH (ref 70–99)
Potassium: 3.9 mEq/L (ref 3.7–5.3)
Sodium: 139 mEq/L (ref 137–147)

## 2014-05-04 LAB — CBC
HCT: 33.5 % — ABNORMAL LOW (ref 36.0–46.0)
Hemoglobin: 10.9 g/dL — ABNORMAL LOW (ref 12.0–15.0)
MCH: 31.5 pg (ref 26.0–34.0)
MCHC: 32.5 g/dL (ref 30.0–36.0)
MCV: 96.8 fL (ref 78.0–100.0)
PLATELETS: 277 10*3/uL (ref 150–400)
RBC: 3.46 MIL/uL — AB (ref 3.87–5.11)
RDW: 13.8 % (ref 11.5–15.5)
WBC: 12.7 10*3/uL — ABNORMAL HIGH (ref 4.0–10.5)

## 2014-05-04 MED ORDER — SODIUM CHLORIDE 0.9 % IV SOLN
INTRAVENOUS | Status: DC
  Administered 2014-05-04 – 2014-05-05 (×2): via INTRAVENOUS

## 2014-05-04 NOTE — Progress Notes (Signed)
Subjective: Much improved today  Exam: Filed Vitals:   05/04/14 0948  BP: 135/55  Pulse: 88  Temp: 99.5 F (37.5 C)  Resp: 20   Gen: In bed, NAD MS: Awake, answers questions. Some difficulty with repetition. Follows commands briskly.  CN: Pupils equal round and reactive to light, blinks to eyelid stimulation bilaterally. VFF, does not fully look to either side.  Motor: squeezes hands bilaterally. Wiggles toes bilaterally.  Sensory: Intact to light touch  CT head - SDH is stable.   Impression: 80 year old female with subdural hematoma and aphasia. I suspect that her worsening a couple of days ago was due to her C-Diff infection rather than seizure. I would favor continuing to monitor and treat.   Recommendations: 1) continue vimpat at 100 mg twice a day 2) will continue to follow.    Ritta Slot, MD [facility] [phone]  If 7pm- 7am, please page neurology on call as listed in AMION.

## 2014-05-04 NOTE — Progress Notes (Signed)
Patient ID: Marissa Parsons, female   DOB: 11/28/1928, 80 y.o.   MRN: [id number] Afeb, vss No new neuro issues. Her repeat CT head yesterday is almost normal with hardly any subdural fluid, and minimal shift. No need to consider any intervention at this time.

## 2014-05-04 NOTE — Progress Notes (Signed)
Speech Language Pathology Treatment: Dysphagia  Patient Details Name: Marissa Parsons MRN: [id number] DOB: [birth year] Today's Date: 05/04/2014 Time: 1200-1210 SLP Time Calculation (min): 10 min  Assessment / Plan / Recommendation Clinical Impression  Diagnostic treatment focused on readiness for instrumental testing today. Pateint lethargic, arousing only with maximum verbal and tactile cueing. Unable to maintain appropriate level of alertness for testing. SLP prompted patient for audible phonation which was ultimately wet indicative of poor secretion management, possible penetration and/or aspiration. Oral cavity assessed. Thick secretions noted along soft palate and on base of tongue. Oral care complete by SLP with removal of secretions from soft palate. Cued patient to cough which was minimally successful to clear vocal quality. At this time, aspiration risk high with all pos. Discussed with RN. Recommend NPO with f/u diagnostic treatment 8/9 to determine readiness for MBS.    HPI HPI: 80 y.o. female who is unable to provide history due to being both expressive and receptively aphasic. Per notes, patient sustained a fall back in July in which she had a small left sided SDH with minimal mass effect. Patient returned to ED on 8/3 due to family member noting she was slower to respond, that her memory was not normal, that she was not able to remember names of people she knew well. On arrival to ED initial CT showed a improved SDH; MRI - Persistent left subdural hematoma, widespread in the left hemisphere. MRI appearance today is similar to the CT appearance.    Pertinent Vitals Pain Assessment: No/denies pain Pain Score: 0-No pain  SLP Plan  Continue with current plan of care;MBS    Recommendations Diet recommendations: NPO Medication Administration: Via alternative means              Oral Care Recommendations:  (QID) Follow up Recommendations: Inpatient Rehab Plan: Continue with current plan  of care;MBS    GO   Ferdinand Lango MA, CCC-SLP [phone]   [name] 05/04/2014, 1:22 PM

## 2014-05-04 NOTE — Progress Notes (Signed)
Subjective: Patient awake and alert following simple commands, no apparent distress, some gurgling, with moist cough, denies any pain, shortness of breath follows one-step commands. Notes from neurology and neurosurgery reviewed, currently n.p.o. awaiting swallowing study later today. No issues reported by staff  Objective: Vital signs in last 24 hours: Temp:  [97.8 F (36.6 C)-101 F (38.3 C)] 99.5 F (37.5 C) (08/08 0948) Pulse Rate:  [69-88] 88 (08/08 0948) Resp:  [18-20] 20 (08/08 0948) BP: (116-150)/(43-55) 135/55 mmHg (08/08 0948) SpO2:  [94 %-99 %] 94 % (08/08 0948) Weight:  [64.32 kg (141 lb 12.8 oz)] 64.32 kg (141 lb 12.8 oz) (08/08 0500) Weight change: 3.538 kg (7 lb 12.8 oz) Last BM Date: 05/02/14  CBG (last 3)   Recent Labs  05/03/14 2350 05/04/14 0331 05/04/14 0807  GLUCAP 137* 142* 121*    Intake/Output from previous day: 08/07 0701 - 08/08 0700 In: 180 [P.O.:180] Out: 950 [Urine:950] Intake/Output this shift:   Frail elderly female in no apparent distress following simple commands Sclera anicteric Oropharyngeal lesions mucosa moist Neck supple no cervical lymphadenopathy Lungs reveal coarse rhonchi with no respiratory distress or accessory muscle usage Heart vascular reveals regular rate and rhythm Abdomen soft, nontender, nondistended bowel sounds present No edema pedal pulses intact no cyanosis Alert and oriented to person and place, following simple one-step commands    Lab Results:  Recent Labs  05/03/14 0503 05/04/14 0429  NA 136* 139  K 3.8 3.9  CL 103 104  CO2 22 24  GLUCOSE 93 125*  BUN 12 14  CREATININE 0.54 0.45*  CALCIUM 8.4 8.7   No results found for this basename: AST, ALT, ALKPHOS, BILITOT, PROT, ALBUMIN,  in the last 72 hours  Recent Labs  05/03/14 0503 05/04/14 0429  WBC 12.6* 12.7*  HGB 10.8* 10.9*  HCT 33.8* 33.5*  MCV 97.4 96.8  PLT 287 277   No results found for this basename: CKTOTAL, CKMB, CKMBINDEX,  TROPONINI,  in the last 72 hours No results found for this basename: TSH, T4TOTAL, FREET3, T3FREE, THYROIDAB,  in the last 72 hours No results found for this basename: VITAMINB12, FOLATE, FERRITIN, TIBC, IRON, RETICCTPCT,  in the last 72 hours  Studies/Results: Ct Head Wo Contrast  05/03/2014   CLINICAL DATA:  Traumatic subdural hemorrhage with worsening somnolence  EXAM: CT HEAD WITHOUT CONTRAST  TECHNIQUE: Contiguous axial images were obtained from the base of the skull through the vertex without intravenous contrast.  COMPARISON:  Prior head CT 04/29/2014  FINDINGS: Evolution of left subdural hemorrhage. The hemorrhage has spread out over the left temporal cortex. No significant new mass effect or progression of the 3 mm of midline shift. Global atrophy and advanced chronic microvascular ischemic white matter disease are also similar compared to prior. No evidence of acute infarct, new hemorrhage, mass or mass effect.  IMPRESSION: 1. Stable left subdural hemorrhage with expected evolution compared to 04/29/2014. 2. Stable 3 mm left-to-right midline shift. 3. No new acute intracranial abnormality.   Electronically Signed   By: Malachy Moan [name]   On: 05/03/2014 08:00   Dg Chest Port 1v Same Day  05/02/2014   CLINICAL DATA:  Fever  EXAM: PORTABLE CHEST - 1 VIEW SAME DAY  COMPARISON:  05/01/2014  FINDINGS: Cardiomediastinal silhouette is stable. Question small right pleural effusion. Hazy right basilar atelectasis or infiltrate. Stable right arm PICC line position. No pulmonary edema.  IMPRESSION: Small right pleural effusion. Hazy right basilar atelectasis or infiltrate. No pulmonary edema.  Electronically Signed   By: Natasha Mead [name]   On: 05/02/2014 14:26     Medications: Scheduled: . bisoprolol-hydrochlorothiazide  1 tablet Oral Daily  . diltiazem  240 mg Oral Daily  . folic acid  1 mg Oral Daily  . lacosamide  100 mg Oral BID  . levothyroxine  100 mcg Oral Once per day on Sun Tue Wed Thu  Sat  . levothyroxine  50 mcg Oral Once per day on Mon Fri  . methotrexate  10 mg Oral Once per day on Tue Wed  . predniSONE  5 mg Oral Q breakfast  . saccharomyces boulardii  250 mg Oral BID  . vancomycin  125 mg Oral 4 times per day   Continuous: . sodium chloride 50 mL/hr at 05/04/14 1115  . 0.9 % NaCl with KCl 20 mEq / L 10 mL/hr at 05/04/14 1791    Assessment/Plan: Principal Problem:   Fever-infectious disease consult appreciated, positive for C. 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 Leukocytosis stable fever curve stable   S/P lumbar laminectomy complicated by recent MRSA infection, stable, neurosurgery following   Wound infection after surgery completed 6 week therapy   Subdural hematoma-stable, neurosurgery and neurology notes reviewed, EEG performed yesterday on antiseizure   Aphasia with altered mental status, multifactorial secondary to subdural hematoma improving CT scans reviewed, EEG reviewed, neurology and neurosurgery following, complicated by C. difficile colitis and infection being treated appropriately. Presumed aspiration, speech therapy following, swallowing study pending later today, n.p.o., will initiate IV fluids for adequate hydration Hypothyroid on supplementation DO NOT RESUSCITATE    LOS: 5 days   Doranne Schmutz R 05/04/2014, 11:17 AM

## 2014-05-05 ENCOUNTER — Inpatient Hospital Stay (HOSPITAL_COMMUNITY): Payer: Medicare HMO

## 2014-05-05 LAB — CULTURE, BLOOD (ROUTINE X 2)
Culture: NO GROWTH
Culture: NO GROWTH

## 2014-05-05 LAB — GLUCOSE, CAPILLARY
GLUCOSE-CAPILLARY: 111 mg/dL — AB (ref 70–99)
GLUCOSE-CAPILLARY: 77 mg/dL (ref 70–99)
GLUCOSE-CAPILLARY: 99 mg/dL (ref 70–99)
GLUCOSE-CAPILLARY: 134 mg/dL — AB (ref 70–99)
Glucose-Capillary: 103 mg/dL — ABNORMAL HIGH (ref 70–99)
Glucose-Capillary: 114 mg/dL — ABNORMAL HIGH (ref 70–99)

## 2014-05-05 LAB — BASIC METABOLIC PANEL
ANION GAP: 12 (ref 5–15)
BUN: 12 mg/dL (ref 6–23)
CHLORIDE: 100 meq/L (ref 96–112)
CO2: 25 meq/L (ref 19–32)
Calcium: 9.2 mg/dL (ref 8.4–10.5)
Creatinine, Ser: 0.43 mg/dL — ABNORMAL LOW (ref 0.50–1.10)
GFR calc Af Amer: >90 mL/min (ref >90)
GFR calc non Af Amer: >90 mL/min (ref >90)
Glucose, Bld: 99 mg/dL (ref 70–99)
POTASSIUM: 3.7 meq/L (ref 3.7–5.3)
Sodium: 137 mEq/L (ref 137–147)

## 2014-05-05 LAB — CBC
HEMATOCRIT: 34.1 % — AB (ref 36.0–46.0)
Hemoglobin: 11.1 g/dL — ABNORMAL LOW (ref 12.0–15.0)
MCH: 31.7 pg (ref 26.0–34.0)
MCHC: 32.6 g/dL (ref 30.0–36.0)
MCV: 97.4 fL (ref 78.0–100.0)
Platelets: 328 10*3/uL (ref 150–400)
RBC: 3.5 MIL/uL — AB (ref 3.87–5.11)
RDW: 13.9 % (ref 11.5–15.5)
WBC: 13.1 10*3/uL — AB (ref 4.0–10.5)

## 2014-05-05 MED ORDER — METHYLPREDNISOLONE SODIUM SUCC 40 MG IJ SOLR
40.0000 mg | Freq: Every day | INTRAMUSCULAR | Status: DC
  Administered 2014-05-05 – 2014-05-06 (×2): 40 mg via INTRAVENOUS
  Filled 2014-05-05 (×2): qty 1

## 2014-05-05 MED ORDER — LEVOTHYROXINE SODIUM 100 MCG IV SOLR
50.0000 ug | Freq: Every day | INTRAVENOUS | Status: DC
  Administered 2014-05-05: 50 ug via INTRAVENOUS
  Filled 2014-05-05 (×3): qty 5

## 2014-05-05 MED ORDER — METOPROLOL TARTRATE 1 MG/ML IV SOLN: 5.0000 mg | INTRAVENOUS | Status: DC | PRN

## 2014-05-05 MED ORDER — SODIUM CHLORIDE 0.9 % IV SOLN
150.0000 mg | Freq: Two times a day (BID) | INTRAVENOUS | Status: DC
  Administered 2014-05-05 – 2014-05-06 (×4): 150 mg via INTRAVENOUS
  Filled 2014-05-05 (×10): qty 15

## 2014-05-05 MED ORDER — DEXTROSE 5 % IV SOLN
1.0000 g | INTRAVENOUS | Status: DC
  Administered 2014-05-05 – 2014-05-07 (×3): 1 g via INTRAVENOUS
  Filled 2014-05-05 (×4): qty 10

## 2014-05-05 MED ORDER — DEXTROSE-NACL 5-0.45 % IV SOLN
INTRAVENOUS | Status: DC
  Administered 2014-05-05 – 2014-05-06 (×3): via INTRAVENOUS

## 2014-05-05 MED ORDER — LEVOTHYROXINE SODIUM 100 MCG IV SOLR: 25.0000 ug | Freq: Every day | INTRAVENOUS | Status: DC

## 2014-05-05 NOTE — Progress Notes (Signed)
Speech Language Pathology Treatment: Dysphagia  Patient Details Name: Marissa Parsons MRN: [id number] DOB: [birth year] Today's Date: 05/05/2014 Time: 3295-1884 SLP Time Calculation (min): 15 min  Assessment / Plan / Recommendation Clinical Impression  Diagnostic treatment focused on readiness for instrumental study. Patient remains lethargic however slightly more responsive verbally to maximum SLP basic biographical questioning. Patient however unable to remain alert despite max SLP cues (verbal and tactile). Oral care provided with noted secretions coating tongue and palate. Ice chip provided to assess for stimulation of functional oral movements and initiation of swallow. Patient with little-no oral manipulation of bolus despite maximum verbal and tactile cues provided by SLP. Oral cavity again suctioned. Intermittent wet cough noted through out treatment, ? Aspiration of secretions. Patient not appropriate for MBS this date. Will continue efforts as mentation appearing to be primary barrier to progression at this time.    HPI HPI: 80 y.o. female who is unable to provide history due to being both expressive and receptively aphasic. Per notes, patient sustained a fall back in July in which she had a small left sided SDH with minimal mass effect. Patient returned to ED on 8/3 due to family member noting she was slower to respond, that her memory was not normal, that she was not able to remember names of people she knew well. On arrival to ED initial CT showed a improved SDH; MRI - Persistent left subdural hematoma, widespread in the left hemisphere. MRI appearance today is similar to the CT appearance.    Pertinent Vitals Pain Assessment: No/denies pain Pain Score: 0-No pain  SLP Plan  Continue with current plan of care;MBS (MBS when alert)    Recommendations Diet recommendations: NPO Medication Administration: Via alternative means              Oral Care Recommendations:  (QID) Follow up  Recommendations: Inpatient Rehab Plan: Continue with current plan of care;MBS (MBS when alert)    GO   Marissa Lango MA, CCC-SLP [phone]   Marissa Parsons Meryl 05/05/2014, 10:40 AM

## 2014-05-05 NOTE — Progress Notes (Signed)
Subjective: Worsening MS; barely arousable; no commands; not able to handle sectreations, unable to perform MBSS. Unable to take PO.Very different from previous day.  Objective: Vital signs in last 24 hours: Temp:  [98.1 F (36.7 C)-100.1 F (37.8 C)] 98.4 F (36.9 C) (08/09 0940) Pulse Rate:  [64-77] 71 (08/09 0940) Resp:  [18-20] 18 (08/09 0940) BP: (121-138)/(49-69) 135/51 mmHg (08/09 0940) SpO2:  [96 %-99 %] 96 % (08/09 0940) Weight:  [63.05 kg (139 lb)] 63.05 kg (139 lb) (08/09 0500) Weight change: -1.27 kg (-2 lb 12.8 oz) Last BM Date: 05/02/14  CBG (last 3)   Recent Labs  05/04/14 2354 05/05/14 0332 05/05/14 0810  GLUCAP 114* 103* 111*    Intake/Output from previous day: 08/08 0701 - 08/09 0700 In: -  Out: 550 [Urine:550] Intake/Output this shift:   Frail elderly female with moist weak cough, not following commands but arousable  Sclera anicteric Oropharyngeal lesions mucosa moist Neck supple no cervical lymphadenopathy Lungs reveal coarse rhonchi with no respiratory distress or accessory muscle usage Heart vascular reveals regular rate and rhythm Abdomen soft, nontender, nondistended bowel sounds present No edema pedal pulses intact no cyanosis Arousable but not following commands    Lab Results:  Recent Labs  05/04/14 0429 05/05/14 0400  NA 139 137  K 3.9 3.7  CL 104 100  CO2 24 25  GLUCOSE 125* 99  BUN 14 12  CREATININE 0.45* 0.43*  CALCIUM 8.7 9.2   No results found for this basename: AST, ALT, ALKPHOS, BILITOT, PROT, ALBUMIN,  in the last 72 hours  Recent Labs  05/04/14 0429 05/05/14 0400  WBC 12.7* 13.1*  HGB 10.9* 11.1*  HCT 33.5* 34.1*  MCV 96.8 97.4  PLT 277 328   No results found for this basename: CKTOTAL, CKMB, CKMBINDEX, TROPONINI,  in the last 72 hours No results found for this basename: TSH, T4TOTAL, FREET3, T3FREE, THYROIDAB,  in the last 72 hours No results found for this basename: VITAMINB12, FOLATE, FERRITIN, TIBC,  IRON, RETICCTPCT,  in the last 72 hours  Studies/Results: No results found.   Medications: Scheduled: . bisoprolol-hydrochlorothiazide  1 tablet Oral Daily  . diltiazem  240 mg Oral Daily  . folic acid  1 mg Oral Daily  . lacosamide  100 mg Oral BID  . levothyroxine  100 mcg Oral Once per day on Sun Tue Wed Thu Sat  . levothyroxine  50 mcg Oral Once per day on Mon Fri  . methotrexate  10 mg Oral Once per day on Tue Wed  . predniSONE  5 mg Oral Q breakfast  . saccharomyces boulardii  250 mg Oral BID  . vancomycin  125 mg Oral 4 times per day   Continuous: . sodium chloride 50 mL/hr at 05/05/14 0918  . 0.9 % NaCl with KCl 20 mEq / L 10 mL/hr at 05/04/14 3810    Assessment/Plan: Principal Problem:   Fever-infectious disease consult appreciated, positive for C. 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 Leukocytosis stable fever curve stable, difficult treatment given MS changes.   S/P lumbar laminectomy complicated by recent MRSA infection, stable, neurosurgery following   Wound infection after surgery completed 6 week therapy   Subdural hematoma-stable, neurosurgery and neurology notes reviewedfrom yesterday but discussed with Neurology; will be ordering Head CT, EEG performed 2 days ago on antiseizure meds   Aphasia  with altered mental status, multifactorial secondary to subdural hematoma improving CT scans reviewed, EEG reviewed, neurology and neurosurgery following, complicated by C. difficile colitis and infection being treated appropriately.However worse in last 24 hours; question relationship to evoloving neuro event vs. Infection? Aspiration will get CXR; if worse may need reinitiation of rocephin. Presumed aspiration, speech therapy following, swallowing study needed but fear patient will fail, n.p.o., will initiate IV  fluids for adequate hydration Hypothyroid on supplementation-will switch to PO DO NOT RESUSCITATE    LOS: 6 days   Fawaz Borquez R 05/05/2014, 10:01 AM

## 2014-05-05 NOTE — Progress Notes (Signed)
Subjective: Not following commands, per IM, but I was able to get her to rouse and follow commands. Certainly worse MS than yesterday.   Exam: Filed Vitals:   05/05/14 0940  BP: 135/51  Pulse: 71  Temp: 98.4 F (36.9 C)  Resp: 18   Gen: In bed, NAD MS: lethargic, but does rouse with noxious stimulation. Tells me her name, able to follow simple commands MY:[id number], cross midline in both directions Motor: f/c x 4 Sensory:responds to nox stim x 4.    Impression: 80 yo F with waxing and waning mental status in the setting of SDH, C-Diff colitis. I wonder if her worsening could be due to aspiration, but one other possibility would be that she was post-ictal. It may bre reasonable to increase vimpat slightly, though this is not clear.   Recommendations: 1) Increase vimpat to 150mg  BID 2) repeat EEG tomorrow, will consider prolonged EEG.  3) will continue to follow.   , MD [facility] [phone]  If 7pm- 7am, please page neurology on call as listed in AMION.

## 2014-05-06 ENCOUNTER — Inpatient Hospital Stay (HOSPITAL_COMMUNITY): Payer: Medicare HMO

## 2014-05-06 DIAGNOSIS — A0472 Enterocolitis due to Clostridium difficile, not specified as recurrent: Secondary | ICD-10-CM

## 2014-05-06 DIAGNOSIS — Z515 Encounter for palliative care: Secondary | ICD-10-CM

## 2014-05-06 DIAGNOSIS — I6200 Nontraumatic subdural hemorrhage, unspecified: Secondary | ICD-10-CM

## 2014-05-06 DIAGNOSIS — R4701 Aphasia: Secondary | ICD-10-CM

## 2014-05-06 LAB — GLUCOSE, CAPILLARY
GLUCOSE-CAPILLARY: 121 mg/dL — AB (ref 70–99)
GLUCOSE-CAPILLARY: 145 mg/dL — AB (ref 70–99)
GLUCOSE-CAPILLARY: 151 mg/dL — AB (ref 70–99)
GLUCOSE-CAPILLARY: 159 mg/dL — AB (ref 70–99)
GLUCOSE-CAPILLARY: 160 mg/dL — AB (ref 70–99)
Glucose-Capillary: 177 mg/dL — ABNORMAL HIGH (ref 70–99)
Glucose-Capillary: 139 mg/dL — ABNORMAL HIGH (ref 70–99)

## 2014-05-06 LAB — CBC
HCT: 32.6 % — ABNORMAL LOW (ref 36.0–46.0)
HEMOGLOBIN: 10.4 g/dL — AB (ref 12.0–15.0)
MCH: 30.8 pg (ref 26.0–34.0)
MCHC: 31.9 g/dL (ref 30.0–36.0)
MCV: 96.4 fL (ref 78.0–100.0)
Platelets: 347 10*3/uL (ref 150–400)
RBC: 3.38 MIL/uL — AB (ref 3.87–5.11)
RDW: 13.6 % (ref 11.5–15.5)
WBC: 9.2 10*3/uL (ref 4.0–10.5)

## 2014-05-06 LAB — BASIC METABOLIC PANEL
ANION GAP: 9 (ref 5–15)
BUN: 14 mg/dL (ref 6–23)
CO2: 26 meq/L (ref 19–32)
Calcium: 9.3 mg/dL (ref 8.4–10.5)
Chloride: 104 mEq/L (ref 96–112)
Creatinine, Ser: 0.37 mg/dL — ABNORMAL LOW (ref 0.50–1.10)
GFR calc Af Amer: >90 mL/min (ref >90)
GFR calc non Af Amer: >90 mL/min (ref >90)
GLUCOSE: 167 mg/dL — AB (ref 70–99)
Potassium: 3.9 mEq/L (ref 3.7–5.3)
SODIUM: 139 meq/L (ref 137–147)

## 2014-05-06 MED ORDER — LEVOTHYROXINE SODIUM 100 MCG PO TABS: 100.0000 ug | ORAL_TABLET | ORAL | Status: DC

## 2014-05-06 MED ORDER — LEVOTHYROXINE SODIUM 100 MCG IV SOLR: 50.0000 ug | INTRAVENOUS | Status: DC

## 2014-05-06 MED ORDER — PREDNISOLONE 5 MG PO TABS: 5.0000 mg | ORAL_TABLET | Freq: Every day | ORAL | Status: DC

## 2014-05-06 MED ORDER — LEVOTHYROXINE SODIUM 100 MCG IV SOLR
25.0000 ug | INTRAVENOUS | Status: DC
  Administered 2014-05-06: 25 ug via INTRAVENOUS
  Filled 2014-05-06: qty 5

## 2014-05-06 MED ORDER — BOOST PLUS PO LIQD
237.0000 mL | Freq: Two times a day (BID) | ORAL | Status: DC
  Administered 2014-05-07 – 2014-05-08 (×2): 237 mL via ORAL
  Filled 2014-05-06 (×6): qty 237

## 2014-05-06 MED ORDER — LEVOTHYROXINE SODIUM 50 MCG PO TABS: 50.0000 ug | ORAL_TABLET | ORAL | Status: DC

## 2014-05-06 MED ORDER — PREDNISONE 5 MG PO TABS
5.0000 mg | ORAL_TABLET | Freq: Every day | ORAL | Status: DC
  Administered 2014-05-07 – 2014-05-08 (×2): 5 mg via ORAL
  Filled 2014-05-06 (×2): qty 1

## 2014-05-06 MED ORDER — LEVOTHYROXINE SODIUM 100 MCG PO TABS
100.0000 ug | ORAL_TABLET | ORAL | Status: DC
  Administered 2014-05-07 – 2014-05-08 (×2): 100 ug via ORAL
  Filled 2014-05-06 (×2): qty 1

## 2014-05-06 MED ORDER — METRONIDAZOLE IN NACL 5-0.79 MG/ML-% IV SOLN
500.0000 mg | Freq: Three times a day (TID) | INTRAVENOUS | Status: DC
  Administered 2014-05-06 – 2014-05-07 (×4): 500 mg via INTRAVENOUS
  Filled 2014-05-06 (×4): qty 100

## 2014-05-06 NOTE — Progress Notes (Signed)
OT Cancellation Note  Patient Details Name: ITATI BROCKSMITH MRN: [id number] DOB: 01/08/1929   Cancelled Treatment:    Reason Eval/Treat Not Completed: Other (comment) (Spoke with nurse and pt going for modified barium swallow any minute )  Earlie Raveling OTR/L [phone] 05/06/2014, 1:12 PM

## 2014-05-06 NOTE — Progress Notes (Signed)
Awaiting on SLP to see if patient will need the MBS. If not will go ahead and insert the tube for feeding.

## 2014-05-06 NOTE — Progress Notes (Signed)
Speech Language Pathology Treatment: Dysphagia  Patient Details Name: Marissa Parsons MRN: [id number] DOB: 02-14-1929 Today's Date: 05/06/2014 Time: 2248-2500 SLP Time Calculation (min): 45 min  Assessment / Plan / Recommendation Clinical Impression  Pt more alert than previously documented. Pt able to answer simple questions, however, receptive and expressive aphasia is evident.  Voice quality was noted to be clear. Oral care completed with suction. Pt required assist to remove upper and lower dentures. Thick whitish coating noted on lingual surface - RN informed. SLP cleaned and replaced upper and lower dentures. Pt exhibited intermittent congested, nonproductive cough prior to and following po trials. Pt was given ice chips, puree, and water via straw. No overt s/s aspiration exhibited, however, silent aspiration cannot be detected at bedside. Given improvement in alertness, will proceed with objective study via MBS this afternoon at 1300. RN was informed of this plan.  PO recommendations and changes in plan of care pending MBS.    HPI HPI: 80 y.o. female who is unable to provide history due to being both expressive and receptively aphasic. Per notes, patient sustained a fall back in July 2015 in which she had a small left sided SDH with minimal mass effect. Patient returned to ED on 8/3 due to family member noting she was slower to respond, that her memory was not normal, that she was not able to remember names of people she knew well. On arrival to ED initial CT showed a improved SDH; MRI - Persistent left subdural hematoma, widespread in the left hemisphere. MRI appearance is similar to the CT appearance.  05/05/14 CXR revealed increased opacity in RLL. CT 05/05/14 indicated mild SDH with 4-39mm rightward midline shift.   Pertinent Vitals Pain Assessment: No/denies pain  SLP Plan  Continue with current plan of care;MBS    Recommendations Diet recommendations: NPO (pending MBS results and  recommendations)              Oral Care Recommendations: Oral care Q4 per protocol Follow up Recommendations: Inpatient Rehab Plan: Continue with current plan of care;MBS    GO   Marissa Parsons [facility], CCC-SLP [phone]  Leigh Aurora 05/06/2014, 11:55 AM

## 2014-05-06 NOTE — Consult Note (Signed)
I have reviewed and discussed case with Nurse Practitioner And agree with documentation and plan as noted above   Colby Catanese J. Lonnell Chaput D.O.  Palliative Medicine Team at [place]  Team Phone: [phone]    

## 2014-05-06 NOTE — Progress Notes (Signed)
INITIAL NUTRITION ASSESSMENT  DOCUMENTATION CODES Per approved criteria  -Not Applicable   INTERVENTION: If unable to advance diet, recommend the following TF regimen: Initiate Jevity 1.2 @ 20 ml/hr via NGT and increase by 10 ml every 4 hours to goal rate of 55 ml/hr.   Tube feeding regimen will provides 1584 kcal (100% of needs), 73 grams of protein, and 1069 ml of H2O.   If diet advance, provide Boost Plus BID  NUTRITION DIAGNOSIS: Inadequate oral intake related to inability to eat as evidenced by NPO status.   Goal: Pt to meet >/= 90% of their estimated nutrition needs   Monitor:  TF initiation/tolerance, weight trend, labs, GOC  Reason for Assessment: Consult for TF recommendations  80 y.o. female  Admitting Dx: Fever  ASSESSMENT: 80 y.o. female who is unable to provide history due to being both expressive and receptively aphasic. Per notes, patient sustained a fall back in July 2015 in which she had a small left sided SDH with minimal mass effect. Patient returned to ED on 8/3 due to family member noting she was slower to respond, that her memory was not normal, that she was not able to remember names of people she knew well. On arrival to ED initial CT showed a improved SDH; MRI - Persistent left subdural hematoma, widespread in the left hemisphere.  Per MD note, panda to be placed today. Per RN, pt will have MBS first, to confirm inability to swallow, before NGT will be placed. RD familiar with patient from previous admission at which time pt reported that she normally doesn't have much of an appetite, weighs 132 lbs, and eats 2 meals daily. Per nursing notes pt was eating 40-50% of meals on 8/7 before being made NPO. Pt's weight appears stable.  Labs reviewed.    Height: Ht Readings from Last 1 Encounters:  04/29/14 5\' 7"  (1.702 m)    Weight: Wt Readings from Last 1 Encounters:  05/06/14 140 lb 11.2 oz (63.821 kg)    Ideal Body Weight: 135 lbs  % Ideal Body  Weight: 104%  Wt Readings from Last 10 Encounters:  05/06/14 140 lb 11.2 oz (63.821 kg)  04/12/14 134 lb (60.782 kg)  03/21/14 129 lb 10.1 oz (58.8 kg)  03/21/14 129 lb 10.1 oz (58.8 kg)  03/03/14 131 lb 8 oz (59.648 kg)  03/03/14 131 lb 8 oz (59.648 kg)  04/30/13 138 lb (62.596 kg)  11/16/12 148 lb 12.8 oz (67.495 kg)  08/07/12 149 lb (67.586 kg)  07/26/12 157 lb 10.1 oz (71.5 kg)    Usual Body Weight: 132 lbs  % Usual Body Weight: 106%  BMI:  Body mass index is 22.03 kg/(m^2).  Estimated Nutritional Needs: Kcal: 1550-1550  Protein: 75-85 grams  Fluid: 1.5 L/day  Skin: +1 generalized edema  Diet Order: NPO  EDUCATION NEEDS: -No education needs identified at this time   Intake/Output Summary (Last 24 hours) at 05/06/14 1044 Last data filed at 05/06/14 0502  Gross per 24 hour  Intake      0 ml  Output    550 ml  Net   -550 ml    Last BM: 8/8  Labs:   Recent Labs Lab 05/04/14 0429 05/05/14 0400 05/06/14 0500  NA 139 137 139  K 3.9 3.7 3.9  CL 104 100 104  CO2 24 25 26   BUN 14 12 14   CREATININE 0.45* 0.43* 0.37*  CALCIUM 8.7 9.2 9.3  GLUCOSE 125* 99 167*    CBG (last  3)   Recent Labs  05/06/14 0036 05/06/14 0359 05/06/14 0757  GLUCAP 159* 177* 151*    Scheduled Meds: . cefTRIAXone (ROCEPHIN)  IV  1 g Intravenous Q24H  . lacosamide (VIMPAT) IV  150 mg Intravenous Q12H  . levothyroxine  25 mcg Intravenous Once per day on Mon Fri  . [START ON 05/07/2014] levothyroxine  50 mcg Intravenous Once per day on Sun Tue Wed Thu Sat  . methotrexate  10 mg Oral Once per day on Tue Wed  . methylPREDNISolone (SOLU-MEDROL) injection  40 mg Intravenous Daily  . metronidazole  500 mg Intravenous 3 times per day  . saccharomyces boulardii  250 mg Oral BID    Continuous Infusions: . dextrose 5 % and 0.45% NaCl 75 mL/hr at 05/06/14 0830    Past Medical History  Diagnosis Date  . Hypertension   . Rheumatoid arteritis   . Phlebitis   . Deep vein  thrombophlebitis of leg   . Fractured pelvis     fall 2011  . Thyroid disease     hypothyroidism  . Humerus fracture   . Thrombocytopenia   . Leukocytosis   . Hypothyroidism   . Phlebitis      Recurrent phlebitis  . Hematoma      Right superior and inferior pubic rami fractures with hematoma adjacent to the right ramus fracture  . Urinary tract infection   . Humerus fracture      Right proximal humerus fracture  . Osteoarthritis     for which she takes chronic prednisone  . Macular degeneration   . Chronic anticoagulation   . Fracture of multiple pubic rami 01/16/2013  . Dementia     MILD    Past Surgical History  Procedure Laterality Date  . Left hip open reduction    . Cholecystectomy    . Abdominal hysterectomy  s  . Sinus surgery with instatrak    . Lumbar laminectomy/decompression microdiscectomy N/A 03/06/2014    Procedure: LUMBAR LAMINECTOMY/DECOMPRESSION LUMBAR THREE, FOUR, AND FIVE ;  Surgeon: Tia Alert, MD;  Location: [facility];  Service: Neurosurgery;  Laterality: N/A;  LUMBAR LAMINECTOMY/DECOMPRESSION LUMBAR THREE, FOUR, AND FIVE   . Lumbar wound debridement N/A 03/21/2014    Procedure: Incision and drainage of lumbar wound;  Surgeon: Tia Alert, MD;  Location: [facility];  Service: Neurosurgery;  Laterality: N/A;    Ian Malkin RD, LDN Inpatient Clinical Dietitian Pager: [phone] After Hours Pager: [phone]

## 2014-05-06 NOTE — Progress Notes (Signed)
RN declined inserting tube feeding after the swalow Eval because patient did pass it. She is been on put  Dys 3 soft diet.

## 2014-05-06 NOTE — Consult Note (Signed)
Patient FA:OZHYQMV Marissa Parsons      DOB: Jun 08, 1929      HQI:[id number]     Consult Note from the Palliative Medicine Team at [facility]    Consult Requested by: Dr. Link Snuffer     PCP: Alysia Penna, MD Reason for Consultation: GOC and options    Phone Number:[phone]  Assessment of patients Current state: I met today with Marissa Parsons and Marissa Parsons. [name] was unable to participate in most of conversation - however she does seem more alert today and especially this afternoon. She was even able to tell me she was at "[facility]" this afternoon. Marissa brother is pleased and optimistic with this improvement today. He told me that at home she was having some signs of dementia: repeating words, forgetful, not finding the right words at times. She was ambulating with a walker and needed help with meals.    We discussed Marissa recent history and how they were so pleased after Marissa back surgery that she was pain free and were optimistic she may ambulate better and possibly even without a walker eventually. However she required PICC and antibiotic therapy for spinal infection and now has C. Diff infection as well. She fell at home and now has SDH. Marissa mental state has been greatly fluctuating since admission. She was able to participate in MBS eval today and is recommended a diet. However after discussion with Marissa brother he would want feeding tube if necessary. He would really like for Marissa to be able to walk again. I will continue to follow and support.    Goals of Care: 1.  Code Status: DNR   2. Scope of Treatment: Continue medical management.    4. Disposition: To be determined on outcomes.     3. Symptom Management:   1. Fever: Acetaminophen prn.  2. Nausea/Vomiting: Ondansetron prn.   4. Psychosocial: Emotional support provided to patient and family.    Brief HPI: Marissa Parsons is a 80 yo female admitted with confusion and weakness and found to have SDH and +C. Diff. She had  surgery on lumbar spine ~9 wks ago and found to have spinal infection shortly afterwards. She was being treated with IV antibiotics and PICC line. She had also had a fall at home and was seen in ED ~10 days prior to admission. PMH significant for HTN, fall, fractured pelvis, DVT, hypothyroidism, humerus fracture, UTI, macular degeneration, dementia.    ROS: Denies pain, nausea, anxiety.     PMH:  Past Medical History  Diagnosis Date  . Hypertension   . Rheumatoid arteritis   . Phlebitis   . Deep vein thrombophlebitis of leg   . Fractured pelvis     fall 2011  . Thyroid disease     hypothyroidism  . Humerus fracture   . Thrombocytopenia   . Leukocytosis   . Hypothyroidism   . Phlebitis      Recurrent phlebitis  . Hematoma      Right superior and inferior pubic rami fractures with hematoma adjacent to the right ramus fracture  . Urinary tract infection   . Humerus fracture      Right proximal humerus fracture  . Osteoarthritis     for which she takes chronic prednisone  . Macular degeneration   . Chronic anticoagulation   . Fracture of multiple pubic rami 01/16/2013  . Dementia     MILD     PSH: Past Surgical History  Procedure Laterality Date  . Left  hip open reduction    . Cholecystectomy    . Abdominal hysterectomy  s  . Sinus surgery with instatrak    . Lumbar laminectomy/decompression microdiscectomy N/A 03/06/2014    Procedure: LUMBAR LAMINECTOMY/DECOMPRESSION LUMBAR THREE, FOUR, AND FIVE ;  Surgeon: Tia Alert, MD;  Location: [facility];  Service: Neurosurgery;  Laterality: N/A;  LUMBAR LAMINECTOMY/DECOMPRESSION LUMBAR THREE, FOUR, AND FIVE   . Lumbar wound debridement N/A 03/21/2014    Procedure: Incision and drainage of lumbar wound;  Surgeon: Tia Alert, MD;  Location: [facility];  Service: Neurosurgery;  Laterality: N/A;   I have reviewed the FH and SH and  If appropriate update it with new information. Allergies  Allergen Reactions  . Morphine  And Related     nausea  . Tramadol Other (See Comments)    unknown  . Penicillins Rash   Scheduled Meds: . cefTRIAXone (ROCEPHIN)  IV  1 g Intravenous Q24H  . lacosamide (VIMPAT) IV  150 mg Intravenous Q12H  . levothyroxine  25 mcg Intravenous Once per day on Mon Fri  . [START ON 05/07/2014] levothyroxine  50 mcg Intravenous Once per day on Sun Tue Wed Thu Sat  . methotrexate  10 mg Oral Once per day on Tue Wed  . methylPREDNISolone (SOLU-MEDROL) injection  40 mg Intravenous Daily  . metronidazole  500 mg Intravenous 3 times per day  . saccharomyces boulardii  250 mg Oral BID   Continuous Infusions: . dextrose 5 % and 0.45% NaCl 75 mL/hr at 05/06/14 0830   PRN Meds:.acetaminophen, acetaminophen, albuterol, metoprolol, ondansetron (ZOFRAN) IV    BP 132/48  Pulse 81  Temp(Src) 97.4 F (36.3 C) (Oral)  Resp 18  Ht 5\' 7"  (1.702 m)  Wt 63.821 kg (140 lb 11.2 oz)  BMI 22.03 kg/m2  SpO2 97%   PPS: 30%   Intake/Output Summary (Last 24 hours) at 05/06/14 1509 Last data filed at 05/06/14 1447  Gross per 24 hour  Intake      0 ml  Output   1350 ml  Net  -1350 ml   LBM: 05/04/14                        Physical Exam:  General: NAD, pleasant, thin HEENT: /AT, no JVD, moist mucous membranes Chest: CTA throughout, no labored breathing, symmetric CVS: RRR, S1 S2 Abdomen: Soft, NT, ND, +BS Ext: MAE, no edema, warm to touch Neuro: Awake, oriented to person/place, follows commands, has difficulty finding words at times  Labs: CBC    Component Value Date/Time   WBC 9.2 05/06/2014 0500   RBC 3.38* 05/06/2014 0500   HGB 10.4* 05/06/2014 0500   HCT 32.6* 05/06/2014 0500   PLT 347 05/06/2014 0500   MCV 96.4 05/06/2014 0500   MCH 30.8 05/06/2014 0500   MCHC 31.9 05/06/2014 0500   RDW 13.6 05/06/2014 0500   LYMPHSABS 1.4 04/29/2014 1731   MONOABS 1.0 04/29/2014 1731   EOSABS 0.1 04/29/2014 1731   BASOSABS 0.0 04/29/2014 1731    BMET    Component Value Date/Time   NA 139 05/06/2014 0500    K 3.9 05/06/2014 0500   CL 104 05/06/2014 0500   CO2 26 05/06/2014 0500   GLUCOSE 167* 05/06/2014 0500   BUN 14 05/06/2014 0500   CREATININE 0.37* 05/06/2014 0500   CALCIUM 9.3 05/06/2014 0500   GFRNONAA >90 05/06/2014 0500   GFRAA >90 05/06/2014 0500    CMP  Component Value Date/Time   NA 139 05/06/2014 0500   K 3.9 05/06/2014 0500   CL 104 05/06/2014 0500   CO2 26 05/06/2014 0500   GLUCOSE 167* 05/06/2014 0500   BUN 14 05/06/2014 0500   CREATININE 0.37* 05/06/2014 0500   CALCIUM 9.3 05/06/2014 0500   PROT 6.1 04/29/2014 1731   ALBUMIN 2.7* 04/29/2014 1731   AST 64* 04/29/2014 1731   ALT 27 04/29/2014 1731   ALKPHOS 78 04/29/2014 1731   BILITOT 0.2* 04/29/2014 1731   GFRNONAA >90 05/06/2014 0500   GFRAA >90 05/06/2014 0500      Time In Time Out Total Time Spent with Patient Total Overall Time  1415 1520     Greater than 50%  of this time was spent counseling and coordinating care related to the above assessment and plan.   Yong Channel, NP Palliative Medicine Team Pager # [phone] (M-F 8a-5p) Team Phone # [phone] (Nights/Weekends)

## 2014-05-06 NOTE — Procedures (Signed)
Objective Swallowing Evaluation: Modified Barium Swallowing Study  Patient Details  Name: WAKITA LABERGE MRN: [id number] Date of Birth: 01-23-1929  Today's Date: 05/06/2014 Time: 1400-1420 SLP Time Calculation (min): 20 min  Past Medical History:  Past Medical History  Diagnosis Date  . Hypertension   . Rheumatoid arteritis   . Phlebitis   . Deep vein thrombophlebitis of leg   . Fractured pelvis     fall 2011  . Thyroid disease     hypothyroidism  . Humerus fracture   . Thrombocytopenia   . Leukocytosis   . Hypothyroidism   . Phlebitis      Recurrent phlebitis  . Hematoma      Right superior and inferior pubic rami fractures with hematoma adjacent to the right ramus fracture  . Urinary tract infection   . Humerus fracture      Right proximal humerus fracture  . Osteoarthritis     for which she takes chronic prednisone  . Macular degeneration   . Chronic anticoagulation   . Fracture of multiple pubic rami 01/16/2013  . Dementia     MILD   Past Surgical History:  Past Surgical History  Procedure Laterality Date  . Left hip open reduction    . Cholecystectomy    . Abdominal hysterectomy  s  . Sinus surgery with instatrak    . Lumbar laminectomy/decompression microdiscectomy N/A 03/06/2014    Procedure: LUMBAR LAMINECTOMY/DECOMPRESSION LUMBAR THREE, FOUR, AND FIVE ;  Surgeon: Tia Alert, MD;  Location: [facility];  Service: Neurosurgery;  Laterality: N/A;  LUMBAR LAMINECTOMY/DECOMPRESSION LUMBAR THREE, FOUR, AND FIVE   . Lumbar wound debridement N/A 03/21/2014    Procedure: Incision and drainage of lumbar wound;  Surgeon: Tia Alert, MD;  Location: [facility];  Service: Neurosurgery;  Laterality: N/A;   HPI:  80 y.o. female who is unable to provide history due to being both expressive and receptively aphasic. Per notes, patient sustained a fall back in July 2015 in which she had a small left sided SDH with minimal mass effect. Patient returned to ED on 8/3 due  to family member noting she was slower to respond, that her memory was not normal, that she was not able to remember names of people she knew well. On arrival to ED initial CT showed a improved SDH; MRI - Persistent left subdural hematoma, widespread in the left hemisphere. MRI appearance is similar to the CT appearance.  05/05/14 CXR revealed increased opacity in RLL. CT 05/05/14 indicated mild SDH with 4-72mm rightward midline shift.     Assessment / Plan / Recommendation Clinical Impression  Dysphagia Diagnosis: Mild oral phase dysphagia;Mild pharyngeal phase dysphagia Clinical impression: Patient presents with mild oral and pharyngeal impairments which appear to be impacted by her cognition resulting in prolonged mastication of regular textures and penetration of thin liquids x1 as a result of a delayed swallow initiation with liquids spilling to the pyriform sinuses and patient being distracted.  Patient requires Max assist to problem solve self-feeding and as a result, requires full supervision with PO intake.  Recommend initiation of a Dys 3 soft solid diet with thin liquids and medication whole in puree.  Recommend SLP to follow acutely for diet toleration and advancement.       Treatment Recommendation  Therapy as outlined in treatment plan below    Diet Recommendation Dysphagia 3 (Mechanical Soft);Thin liquid   Liquid Administration via: Cup;Straw Medication Administration: Whole meds with puree Supervision: Full supervision/cueing for compensatory  strategies Compensations: Small sips/bites;Check for pocketing Postural Changes and/or Swallow Maneuvers: Seated upright 90 degrees;Upright 30-60 min after meal    Other  Recommendations Oral Care Recommendations: Oral care BID   Follow Up Recommendations  Inpatient Rehab    Frequency and Duration min 3x week  2 weeks   Pertinent Vitals/Pain None    SLP Swallow Goals  See care plan for details    General Date of Onset: 04/29/14 HPI:  80 y.o. female who is unable to provide history due to being both expressive and receptively aphasic. Per notes, patient sustained a fall back in July 2015 in which she had a small left sided SDH with minimal mass effect. Patient returned to ED on 8/3 due to family member noting she was slower to respond, that her memory was not normal, that she was not able to remember names of people she knew well. On arrival to ED initial CT showed a improved SDH; MRI - Persistent left subdural hematoma, widespread in the left hemisphere. MRI appearance is similar to the CT appearance.  05/05/14 CXR revealed increased opacity in RLL. CT 05/05/14 indicated mild SDH with 4-68mm rightward midline shift. Type of Study: Modified Barium Swallowing Study Reason for Referral: Objectively evaluate swallowing function Previous Swallow Assessment: none per recods Diet Prior to this Study: NPO Temperature Spikes Noted: No Respiratory Status: Nasal cannula History of Recent Intubation: No Behavior/Cognition: Alert;Cooperative;Pleasant mood;Requires cueing Oral Cavity - Dentition: Dentures, top;Dentures, bottom Self-Feeding Abilities: Needs assist;Able to feed self Patient Positioning: Upright in chair Baseline Vocal Quality: Low vocal intensity Volitional Cough: Cognitively unable to elicit Volitional Swallow: Unable to elicit Anatomy: Within functional limits Pharyngeal Secretions: Not observed secondary MBS    Reason for Referral Objectively evaluate swallowing function   Oral Phase Oral Preparation/Oral Phase Oral Phase: Impaired Oral - Solids Oral - Mechanical Soft: Within functional limits Oral - Regular: Other (Comment) (prolonged oral phase)   Pharyngeal Phase Pharyngeal Phase Pharyngeal Phase: Impaired Pharyngeal - Thin Pharyngeal - Thin Cup: Premature spillage to valleculae;Delayed swallow initiation;Penetration/Aspiration during swallow Penetration/Aspiration details (thin cup): Material enters airway,  remains ABOVE vocal cords and not ejected out (x1 due to distractibility ) Pharyngeal - Thin Straw: Premature spillage to pyriform sinuses;Delayed swallow initiation Penetration/Aspiration details (thin straw): Material does not enter airway  Cervical Esophageal Phase    GO    Cervical Esophageal Phase Cervical Esophageal Phase: Impaired Cervical Esophageal Phase - Solids Puree: Other (Comment) (residue above CP segment)        Fae Pippin, M.A., CCC-SLP [phone]  Ester Mabe 05/06/2014, 3:15 PM

## 2014-05-06 NOTE — Progress Notes (Addendum)
Physical Therapy Treatment Patient Details Name: Marissa Parsons MRN: [id number] DOB: 09-04-1929 Today's Date: 05/06/2014    History of Present Illness Marissa Parsons is an [name] pt presents with falls, memory deficits, speech deficits, visual deficits and recent L3-5 Lami.  Pt found to have lt subdural hematoma.    PT Comments    Pt was seen just prior to her swallowing study to avoid fatigue.  Pt very interested in trying therapy but conversation with her is not on track to what happened prior to her hospitalization.  Pt seems unaware of her physical condition and is talking about going home.  Remembers none of her back precautions.  Follow Up Recommendations  CIR     Equipment Recommendations  None recommended by PT    Recommendations for Other Services Rehab consult;Speech consult     Precautions / Restrictions Precautions Precautions: Fall;Back Precaution Comments: Laminectomy on 03/06/14. Restrictions Weight Bearing Restrictions: No    Mobility  Bed Mobility Overal bed mobility: Needs Assistance Bed Mobility: Rolling;Sidelying to Sit Rolling: Max assist Sidelying to sit: Max assist   Sit to supine: Total assist   General bed mobility comments: Tactile cues to initiate and needs  verbal and tactile to sequence and progress to the next postural position  Transfers Overall transfer level: Needs assistance Equipment used: 1 person hand held assist Transfers: Sit to/from Stand Sit to Stand: Total assist         General transfer comment: Pt only partially standing with one to assist but could not obtain more help as pt was scheduled for MBS test and nursing arrived for her  Ambulation/Gait                 Stairs            Wheelchair Mobility    Modified Rankin (Stroke Patients Only)       Balance Overall balance assessment: Needs assistance Sitting-balance support: Single extremity supported;Feet supported Sitting  balance-Leahy Scale: Poor Sitting balance - Comments: Required direct support most of her time sitting bedside for up to 11 minutes and had brief times she would control it.  Leans to R without intervention Postural control: Right lateral lean;Posterior lean                          Cognition Arousal/Alertness: Awake/alert Behavior During Therapy: Flat affect Overall Cognitive Status: Impaired/Different from baseline Area of Impairment: Orientation;Attention;Memory;Following commands;Safety/judgement;Problem solving Orientation Level: Disoriented to;Place;Time;Situation Current Attention Level: Divided;Focused Memory: Decreased recall of precautions;Decreased short-term memory Following Commands: Follows one step commands inconsistently Safety/Judgement: Decreased awareness of safety;Decreased awareness of deficits Awareness: Intellectual Problem Solving: Slow processing;Requires verbal cues;Difficulty sequencing General Comments: Required controlled situation with specific verbal cues to  elicit the exact movement for controlling sitting posture    Exercises      General Comments General comments (skin integrity, edema, etc.): Pt is very weak and not able to tolerate more than short times with any activity.        Pertinent Vitals/Pain Pain Assessment: No/denies pain Pain Score: 0-No pain.  Pt had BP of 132/48, pulse 81 and O2 sat 97% per nsg.    Home Living Family/patient expects to be discharged to:: Private residence Living Arrangements: Alone Available Help at Discharge: Family;Available PRN/intermittently;Friend(s) Type of Home: House Home Access: Stairs to enter Entrance Stairs-Rails: Can reach both Home Layout: One level Home Equipment: Walker - 2 wheels;Shower seat Additional Comments: pt does not  drive due to impaired vision    Prior Function Level of Independence: Independent with assistive device(s)      Comments: Per chart prior to last admit she was  Mod I with AD, however pt with speech deficits and ? cognitive deificts, so difficult to determine PLOF.     PT Goals (current goals can now be found in the care plan section) Progress towards PT goals: Progressing toward goals    Frequency  Min 4X/week    PT Plan Current plan remains appropriate    Co-evaluation             End of Session   Activity Tolerance: Patient limited by fatigue;Treatment limited secondary to medical complications (Comment);Other (comment) (NPO due to swallowing difficulties from subdural hematoma) Patient left: in bed;with bed alarm set;with nursing/sitter in room;with family/visitor present     Time: 6962-9528 PT Time Calculation (min): 23 min  Charges:  $Therapeutic Activity: 23-37 mins                    G Codes:      Marissa Parsons May 14, 2014, 2:57 PM  Marissa Parsons, PT MS Acute Rehab Dept. Number: [phone]

## 2014-05-06 NOTE — Progress Notes (Signed)
UR complete.  Deena Shaub RN, MSN 

## 2014-05-06 NOTE — Progress Notes (Signed)
LTM EEG started 

## 2014-05-06 NOTE — Progress Notes (Signed)
I will meet with Ms. Tenaglia brother today 68/10 ~1430-1500 to discuss goals of care. Thank you for this consult.  Yong Channel, NP Palliative Medicine Team Pager # [phone] (M-F 8a-5p) Team Phone # [phone] (Nights/Weekends)

## 2014-05-06 NOTE — Progress Notes (Signed)
Patient ID: Marissa Parsons, female   DOB: 1928/12/06, 80 y.o.   MRN: [id number]         [facility]    Date of Admission:  04/29/2014           Day 4 C. difficile therapy (currently back on IV metronidazole)        Ceftriaxone restarted yesterday Principal Problem:   Fever Active Problems:   Macular degeneration   Lumbar spinal stenosis   S/P lumbar laminectomy   Wound infection after surgery   Subdural hematoma   Aphasia   . cefTRIAXone (ROCEPHIN)  IV  1 g Intravenous Q24H  . lacosamide (VIMPAT) IV  150 mg Intravenous Q12H  . levothyroxine  25 mcg Intravenous Once per day on Mon Fri  . [START ON 05/07/2014] levothyroxine  50 mcg Intravenous Once per day on Sun Tue Wed Thu Sat  . methotrexate  10 mg Oral Once per day on Tue Wed  . methylPREDNISolone (SOLU-MEDROL) injection  40 mg Intravenous Daily  . metronidazole  500 mg Intravenous 3 times per day  . saccharomyces boulardii  250 mg Oral BID    Subjective: She denies cough, shortness of breath, nausea, vomiting and diarrhea.   Past Medical History  Diagnosis Date  . Hypertension   . Rheumatoid arteritis   . Phlebitis   . Deep vein thrombophlebitis of leg   . Fractured pelvis     fall 2011  . Thyroid disease     hypothyroidism  . Humerus fracture   . Thrombocytopenia   . Leukocytosis   . Hypothyroidism   . Phlebitis      Recurrent phlebitis  . Hematoma      Right superior and inferior pubic rami fractures with hematoma adjacent to the right ramus fracture  . Urinary tract infection   . Humerus fracture      Right proximal humerus fracture  . Osteoarthritis     for which she takes chronic prednisone  . Macular degeneration   . Chronic anticoagulation   . Fracture of multiple pubic rami 01/16/2013  . Dementia     MILD    History  Substance Use Topics  . Smoking status: Former Games developer  . Smokeless tobacco: Never Used  . Alcohol Use: No    Family History  Problem Relation  Age of Onset  . Abnormal EKG Neg Hx   . Abnormal newborn screen Neg Hx   . Achalasia Neg Hx   . Achondroplasia Neg Hx   . Acne Neg Hx   . Acromegaly Neg Hx   . Actinic keratosis Neg Hx   . Acute lymphoblastic leukemia Neg Hx   . Addison's disease Neg Hx   . Adrenal disorder Neg Hx   . Albinism Neg Hx   . Alcohol abuse Neg Hx   . Alkaptonuria Neg Hx   . Allergic rhinitis Neg Hx   . Allergies Neg Hx   . Allergy (severe) Neg Hx   . Alopecia Neg Hx   . Alpha-1 antitrypsin deficiency Neg Hx   . Alport syndrome Neg Hx   . ALS Neg Hx   . Alzheimer's disease Neg Hx   . Ambiguous genitalia Neg Hx   . Amblyopia Neg Hx   . Amenorrhea Neg Hx   . Anal fissures Neg Hx   . Anemia Neg Hx   . Anesthesia problems Neg Hx   . Aneurysm Neg Hx   . Angelman syndrome Neg Hx   .  Angina Neg Hx   . Angioedema Neg Hx   . Ankylosing spondylitis Neg Hx   . Anorectal malformation Neg Hx   . Anorexia nervosa Neg Hx   . Anti-cardiolipin syndrome Neg Hx   . Antithrombin III deficiency Neg Hx   . Anuerysm Neg Hx   . Anxiety disorder Neg Hx   . Aortic aneurysm Neg Hx   . Aortic dissection Neg Hx   . Aortic stenosis Neg Hx   . Aphthous stomatitis Neg Hx   . Aplastic anemia Neg Hx   . Appendicitis Neg Hx   . Apraxia Neg Hx   . Arnold-Chiari malformation Neg Hx   . Arrhythmia Neg Hx   . Arthritis Neg Hx   . Asperger's syndrome Neg Hx   . Asthma Neg Hx   . Ataxia Neg Hx   . Ataxia telangiectasia Neg Hx   . Atopy Neg Hx   . Atrial fibrillation Neg Hx   . Atrophic kidney Neg Hx   . Auditory processing disorder Neg Hx   . Autism Neg Hx   . Autism spectrum disorder Neg Hx   . Autoimmune disease Neg Hx   . AVM Neg Hx   . Baker's cyst Neg Hx   . Barrett's esophagus Neg Hx   . Bartter's syndrome Neg Hx   . Basal cell carcinoma Neg Hx   . Behavior problems Neg Hx   . Bell's palsy Neg Hx   . Benign prostatic hyperplasia Neg Hx   . Bipolar disorder Neg Hx   . Birth defects Neg Hx   . Birth marks  Neg Hx   . Blindness Neg Hx   . Bone cancer Neg Hx   . BOR syndrome Neg Hx   . Bow legs Neg Hx   . Bradycardia Neg Hx   . Brain cancer Neg Hx   . BRCA 1/2 Neg Hx   . Breast cancer Neg Hx   . Broken bones Neg Hx   . Bronchiolitis Neg Hx   . Bronchopulmonary dysplasia Neg Hx   . Bruton's disease Neg Hx   . Bulemia Neg Hx   . Bullous pemphigoid Neg Hx   . Bunion Neg Hx   . Bursitis Neg Hx   . Caf-au-lait spots Neg Hx   . Calcium disorder Neg Hx   . Canavan disease Neg Hx   . Cancer Neg Hx   . Cardiomyopathy Neg Hx   . Carpal tunnel syndrome Neg Hx   . Cataracts Neg Hx   . Celiac disease Neg Hx   . Cerebral aneurysm Neg Hx   . Cerebral palsy Neg Hx   . Cervical cancer Neg Hx   . Cervical polyp Neg Hx   . Cervicitis Neg Hx   . Chalasia Neg Hx   . Chalazion Neg Hx   . Charcot-Marie-Tooth disease Neg Hx   . Chediak-Higashi syndrome Neg Hx   . Chiari malformation Neg Hx   . Childhood respiratory disease Neg Hx   . Choanal atresia Neg Hx   . Cholecystitis Neg Hx   . Cholelithiasis Neg Hx   . Cholesteatoma Neg Hx   . Chondromalacia Neg Hx   . Chorea Neg Hx   . Chromosomal disorder Neg Hx   . Chronic bronchitis Neg Hx   . Chronic fatigue Neg Hx   . Chronic granulomatous disease Neg Hx   . Chronic infections Neg Hx   . Cirrhosis Neg Hx   . Cleft lip Neg Hx   .  Cleft palate Neg Hx   . Clotting disorder Neg Hx   . Club foot Neg Hx   . Coarctation of the aorta Neg Hx   . Colitis Neg Hx   . Collagen disease Neg Hx   . Colon cancer Neg Hx   . Colon polyps Neg Hx   . Colonic polyp Neg Hx   . Color blindness Neg Hx   . Conduct disorder Neg Hx   . Conductive hearing loss Neg Hx   . Congenital adrenal hyperplasia Neg Hx   . Congenital heart disease Neg Hx   . Conjunctivitis Neg Hx   . Consanguinity Neg Hx   . Constipation Neg Hx   . COPD Neg Hx   . Corneal abrasion Neg Hx   . Corneal ulcer Neg Hx   . Coronary aneurysm Neg Hx   . Coronary artery disease Neg Hx   .  Cowden syndrome Neg Hx   . Craniosynostosis Neg Hx   . Cri-du-chat syndrome Neg Hx   . Crohn's disease Neg Hx   . Cushing syndrome Neg Hx   . Cystic fibrosis Neg Hx   . Cystic kidney disease Neg Hx   . Cystinosis Neg Hx   . Decreased libido Neg Hx   . Deep vein thrombosis Neg Hx   . Delayed menopause Neg Hx   . Delayed puberty Neg Hx   . Dementia Neg Hx   . Dental caries Neg Hx   . Depression Neg Hx   . Dermatomyositis Neg Hx   . DES usage Neg Hx   . Developmental delay Neg Hx   . Diabetes Neg Hx   . Diabetes insipidus Neg Hx   . Diabetes type I Neg Hx   . Diabetes type II Neg Hx   . Diabetic kidney disease Neg Hx   . DiGeorge syndrome Neg Hx   . Dilated cardiomyopathy Neg Hx   . Dislocations Neg Hx   . Diverticulitis Neg Hx   . Diverticulosis Neg Hx   . Down syndrome Neg Hx   . Drug abuse Neg Hx   . Dupuytren's contracture Neg Hx   . Dwarfism Neg Hx   . Dysfunctional uterine bleeding Neg Hx   . Dysmenorrhea Neg Hx   . Dyspareunia Neg Hx   . Dysphagia Neg Hx   . Dysrhythmia Neg Hx   . Dystonia Neg Hx   . Early death Neg Hx   . Early menopause Neg Hx   . Early puberty Neg Hx   . Eating disorder Neg Hx   . Eclampsia Neg Hx   . Ectodermal dysplasia Neg Hx   . Eczema Neg Hx   . Edema Neg Hx   . Edward's syndrome Neg Hx   . Ehlers-Danlos syndrome Neg Hx   . Emotional abuse Neg Hx   . Emphysema Neg Hx   . Encopresis Neg Hx   . Endocrine tumor Neg Hx   . Endocrinopathy Neg Hx   . Endometrial cancer Neg Hx   . Endometriosis Neg Hx   . Enuresis Neg Hx   . Eosinophilic granuloma Neg Hx   . Epididymitis Neg Hx   . Erectile dysfunction Neg Hx   . Erythema nodosum Neg Hx   . Esophageal cancer Neg Hx   . Esophageal varices Neg Hx   . Esophagitis Neg Hx   . Exostosis Neg Hx   . Fabry's disease Neg Hx   . Factor IX deficiency Neg Hx   . Factor  V Leiden deficiency Neg Hx   . Factor VIII deficiency Neg Hx   . Failure to thrive Neg Hx   . Fainting Neg Hx   .  Familial dysautonomia Neg Hx   . Familial nephritis Neg Hx   . Familial polyposis Neg Hx   . Fanconi anemia Neg Hx   . Febrile seizures Neg Hx   . Fibrocystic breast disease Neg Hx   . Fibroids Neg Hx   . Fibromyalgia Neg Hx   . Food intolerance Neg Hx   . Fragile X syndrome Neg Hx   . Friedreich's ataxia Neg Hx   . Frontotemporal dementia Neg Hx   . Fuch's dystrophy Neg Hx   . Gait disorder Neg Hx   . Galactosemia Neg Hx   . Gallbladder disease Neg Hx   . Gaucher's disease Neg Hx   . Genodermatoses Neg Hx   . GER disease Neg Hx   . Gestational diabetes Neg Hx   . GI problems Neg Hx   . Glaucoma Neg Hx   . Glomerulonephritis Neg Hx   . Glucose-6-phos deficiency Neg Hx   . Glycogen storage disease Neg Hx   . Goiter Neg Hx   . Gonadal disorder Neg Hx   . Gout Neg Hx   . Graves' disease Neg Hx   . Growth hormone deficiency Neg Hx   . GU problems Neg Hx   . Hammer toes Neg Hx   . Hartnup's disease Neg Hx   . Hashimoto's thyroiditis Neg Hx   . Healthy Neg Hx   . Hearing loss Neg Hx   . Heart block Neg Hx   . Heart defect Neg Hx   . Heart disease Neg Hx   . Heart failure Neg Hx   . Heart murmur Neg Hx   . Hemangiomas Neg Hx   . Hematuria Neg Hx   . Hemochromatosis Neg Hx   . Hemolytic uremic syndrome Neg Hx   . Hemophilia Neg Hx   . Henoch-Schonlein purpura Neg Hx   . Hepatitis Neg Hx   . Hepatomegaly Neg Hx   . Hereditary spherocytosis Neg Hx   . Hernia Neg Hx   . Hiatal hernia Neg Hx   . High arches Neg Hx   . Hip dysplasia Neg Hx   . Hip fracture Neg Hx   . Hirschsprung's disease Neg Hx   . Hirsutism Neg Hx   . Histiocytosis X Neg Hx   . HIV Neg Hx   . HLA-B27 positive Neg Hx   . Hodgkin's lymphoma Neg Hx   . Homocystinuria Neg Hx   . Hunter's disease Neg Hx   . Huntington's disease Neg Hx   . Hydrocele Neg Hx   . Hydrocephalus Neg Hx   . Hygroma Neg Hx   . Hypercalcemia Neg Hx   . Hypereosinophilic syndrome Neg Hx   . Hyperinsulinemia Neg Hx   .  Hyperkalemia Neg Hx   . Hyperlipidemia Neg Hx   . Hypermobility Neg Hx   . Hypernasality Neg Hx   . Hyperopia Neg Hx   . Hyperparathyroidism Neg Hx   . Hypersomnolence Neg Hx   . Hypertension Neg Hx   . Hyperthyroidism Neg Hx   . Hypertrophic cardiomyopathy Neg Hx   . Hypokalemia Neg Hx   . Hypoparathyroidism Neg Hx   . Hypoplastic kidney Neg Hx   . Hypotension Neg Hx   . Hypothyroidism Neg Hx   . Idiopathic pulmonary fibrosis Neg Hx   .  Idiopathic torsion dystonia Neg Hx   . IgA nephropathy Neg Hx   . Immunodeficiency Neg Hx   . Imperforate anus Neg Hx   . Impotence Neg Hx   . Impulse control disorder Neg Hx   . Incompetent cervix Neg Hx   . Infertility Neg Hx   . Inflammatory bowel disease Neg Hx   . Inguinal hernia Neg Hx   . Insomnia Neg Hx   . Insulin resistance Neg Hx   . Interstitial cystitis Neg Hx   . Intestinal malrotation Neg Hx   . Intestinal polyp Neg Hx   . Intracerebral hemorrhage Neg Hx   . Iron deficiency Neg Hx   . Irregular heart beat Neg Hx   . Irritable bowel syndrome Neg Hx   . Jaundice Neg Hx   . Job's syndrome Neg Hx   . Joint hypermobility Neg Hx   . Juvenile idiopathic arthritis Neg Hx   . Kartagener's syndrome Neg Hx   . Kawasaki disease Neg Hx   . Keloids Neg Hx   . Kennedy's disease Neg Hx   . Keratoconus Neg Hx   . Kidney cancer Neg Hx   . Kidney disease Neg Hx   . Kidney failure Neg Hx   . Kidney nephrosis Neg Hx   . Klinefelter's syndrome Neg Hx   . Krabbe disease Neg Hx   . Kyphosis Neg Hx   . Labyrinthitis Neg Hx   . Lactose intolerance Neg Hx   . Language disorder Neg Hx   . Lead poisoning Neg Hx   . Learning disabilities Neg Hx   . Legg-Calve-Perthes disease Neg Hx   . Lesch-Nyhan syndrome Neg Hx   . Leukemia Neg Hx   . Lichen planus Neg Hx   . Li-Fraumeni syndrome Neg Hx   . Liver cancer Neg Hx   . Liver disease Neg Hx   . Long QT syndrome Neg Hx   . Lumbar disc disease Neg Hx   . Lung cancer Neg Hx   . Lung  disease Neg Hx   . Lupus Neg Hx   . Lymphoma Neg Hx   . Macrocephaly Neg Hx   . Macrosomia Neg Hx   . Macular degeneration Neg Hx   . Malignant hypertension Neg Hx   . Malignant hyperthermia Neg Hx   . Maple syrup urine disease Neg Hx   . Marfan syndrome Neg Hx   . Mastocytosis Neg Hx   . Melanoma Neg Hx   . Memory loss Neg Hx   . Meniere's disease Neg Hx   . Menstrual problems Neg Hx   . Mental illness Neg Hx   . Mental retardation Neg Hx   . Metabolic syndrome Neg Hx   . Microcephaly Neg Hx   . Migraines Neg Hx   . Milk intolerance Neg Hx   . Miscarriages / Stillbirths Neg Hx   . Mitochondrial disorder Neg Hx   . Mitral valve prolapse Neg Hx   . Motor neuron disease Neg Hx   . Movement disorder Neg Hx   . Moyamoya disease Neg Hx   . Multiple births Neg Hx   . Multiple endocrine neoplasia Neg Hx   . Multiple fractures Neg Hx   . Multiple myeloma Neg Hx   . Multiple sclerosis Neg Hx   . Muscle cancer Neg Hx   . Muscular dystrophy Neg Hx   . Myasthenia gravis Neg Hx   . Myelodysplastic syndrome Neg Hx   . Myocarditis  Neg Hx   . Myoclonus Neg Hx   . Myopathy Neg Hx   . Nail disease Neg Hx   . Narcolepsy Neg Hx   . Nephritis Neg Hx   . Nephrolithiasis Neg Hx   . Nephrotic syndrome Neg Hx   . Neural tube defect Neg Hx   . Neurodegenerative disease Neg Hx   . Neurofibromatosis Neg Hx   . Neuromuscular disorder Neg Hx   . Neuropathy Neg Hx   . Neutropenia Neg Hx   . Nevi Neg Hx   . Niemann-Pick disease Neg Hx   . Night blindness Neg Hx   . Nocturnal enuresis Neg Hx   . Nystagmus Neg Hx   . Obesity Neg Hx   . OCD Neg Hx   . ODD Neg Hx   . Orchitis Neg Hx   . Osler-Weber-Rendu syndrome Neg Hx   . Osteoarthritis Neg Hx   . Osteochondroma Neg Hx   . Osteogenesis imperfecta Neg Hx   . Osteopenia Neg Hx   . Osteoporosis Neg Hx   . Osteosarcoma Neg Hx   . Osteosclerosis Neg Hx   . Other Neg Hx   . Otitis media Neg Hx   . Ovarian cancer Neg Hx   . Ovarian cysts  Neg Hx   . Oxalosis Neg Hx   . Paget's disease of bone Neg Hx   . Pancreatic cancer Neg Hx   . Pancreatitis Neg Hx   . Panhypopituitarism Neg Hx   . Panic disorder Neg Hx   . Paranoid behavior Neg Hx   . Parasomnia Neg Hx   . Parkinsonism Neg Hx   . Patau's syndrome Neg Hx   . Pathological gambling Neg Hx   . PDD Neg Hx   . Pectus carinatum Neg Hx   . Pectus excavatum Neg Hx   . Pelvic inflammatory disease Neg Hx   . Pemphigus vulgaris Neg Hx   . Penile cancer Neg Hx   . Periodic limb movement Neg Hx   . Peripheral vascular disease Neg Hx   . Pernicious anemia Neg Hx   . Personality disorder Neg Hx   . Pes cavus Neg Hx   . Physical abuse Neg Hx   . Otilio Jefferson sequence Neg Hx   . PKU Neg Hx   . Plantar fasciitis Neg Hx   . Pleurisy Neg Hx   . Pneumonia Neg Hx   . Polychondritis Neg Hx   . Polycystic kidney disease Neg Hx   . Polycystic ovary syndrome Neg Hx   . Polycythemia Neg Hx   . Polymyalgia rheumatica Neg Hx   . Polymyositis Neg Hx   . Pompe disease Neg Hx   . Positive PPD/TB Exposure Neg Hx   . Posterior urethral valves Neg Hx   . Post-traumatic stress disorder Neg Hx   . Prader-Willi syndrome Neg Hx   . Premature birth Neg Hx   . Premature ovarian failure Neg Hx   . Preterm labor Neg Hx   . Prolactinoma Neg Hx   . Prostate cancer Neg Hx   . Prostatitis Neg Hx   . Protein C deficiency Neg Hx   . Protein S deficiency Neg Hx   . Proteinuria Neg Hx   . Prune belly syndrome Neg Hx   . Pruritis Neg Hx   . Pseudochol deficiency Neg Hx   . Pseudotumor cerebri Neg Hx   . Psoriasis Neg Hx   . Psychosis Neg Hx   . Ptosis  Neg Hx   . Pulmonary embolism Neg Hx   . Pulmonary fibrosis Neg Hx   . Pyelonephritis Neg Hx   . Pyloric stenosis Neg Hx   . Pyruvate dehydrogenase deficiency Neg Hx   . Rashes / Skin problems Neg Hx   . Raynaud syndrome Neg Hx   . Rectal cancer Neg Hx   . Recurrent abdominal pain Neg Hx   . Reiter's syndrome Neg Hx   . Renal tubular  acidosis Neg Hx   . Restless legs syndrome Neg Hx   . Retinal degeneration Neg Hx   . Retinal detachment Neg Hx   . Retinitis pigmentosa Neg Hx   . Retinoblastoma Neg Hx   . Retinopathy of prematurity Neg Hx   . Reye's syndrome Neg Hx   . Rheum arthritis Neg Hx   . Rheumatic fever Neg Hx   . Rheumatologic disease Neg Hx   . Rickets Neg Hx   . Rosacea Neg Hx   . Sacroiliitis Neg Hx   . Sarcoidosis Neg Hx   . Schizophrenia Neg Hx   . Scleritis Neg Hx   . Scleroderma Neg Hx   . Scoliosis Neg Hx   . Seasonal affective disorder Neg Hx   . Seizures Neg Hx   . Selective mutism Neg Hx   . Sensorineural hearing loss Neg Hx   . Severe combined immunodeficiency Neg Hx   . Severe sprains Neg Hx   . Sexual abuse Neg Hx   . Short stature Neg Hx   . Sick sinus syndrome Neg Hx   . Sickle cell anemia Neg Hx   . Sickle cell trait Neg Hx   . SIDS Neg Hx   . Single kidney Neg Hx   . Sinusitis Neg Hx   . Sjogren's syndrome Neg Hx   . Skeletal dysplasia Neg Hx   . Skin cancer Neg Hx   . Skin telangiectasia Neg Hx   . Sleep apnea Neg Hx   . Sleep disorder Neg Hx   . Sleep walking Neg Hx   . Snoring Neg Hx   . Social phobia Neg Hx   . Speech disorder Neg Hx   . Spina bifida Neg Hx   . Spinal muscular atrophy Neg Hx   . Splenomegaly Neg Hx   . Spondyloarthropathy Neg Hx   . Spondylolisthesis Neg Hx   . Spondylolysis Neg Hx   . Squamous cell carcinoma Neg Hx   . Stevens-Johnson syndrome Neg Hx   . Stickler syndrome Neg Hx   . Stomach cancer Neg Hx   . Strabismus Neg Hx   . Stroke Neg Hx   . Stuttering Neg Hx   . Subarachnoid hemorrhage Neg Hx   . Sudden death Neg Hx   . Suicidality Neg Hx   . Supraventricular tachycardia Neg Hx   . Swallowing difficulties Neg Hx   . Tall stature Neg Hx   . Tay-Sachs disease Neg Hx   . Testicular cancer Neg Hx   . Thalassemia Neg Hx   . Throat cancer Neg Hx   . Thrombocytopenia Neg Hx   . Thrombophilia Neg Hx   . Thrombophlebitis Neg Hx   .  Thrombosis Neg Hx   . Thymic aplasia Neg Hx   . Thyroid cancer Neg Hx   . Thyroid disease Neg Hx   . Thyroid nodules Neg Hx   . Tics Neg Hx   . Tongue cancer Neg Hx   . Torticollis Neg Hx   .  Tourette syndrome Neg Hx   . Tracheal cancer Neg Hx   . Tracheoesophageal fistual Neg Hx   . Tracheomalacia Neg Hx   . Transient ischemic attack Neg Hx   . Treacher Collins syndrome Neg Hx   . Tremor Neg Hx   . Tuberculosis Neg Hx   . Tuberous sclerosis Neg Hx   . Turner syndrome Neg Hx   . Ulcerative colitis Neg Hx   . Ulcers Neg Hx   . Undescended testes Neg Hx   . Unexplained death Neg Hx   . Urinary tract infection Neg Hx   . Urolithiasis Neg Hx   . Urticaria Neg Hx   . Uterine cancer Neg Hx   . Uveitis Neg Hx   . Vaginal cancer Neg Hx   . Valvular heart disease Neg Hx   . Vasculitis Neg Hx   . Velocardiofacial syndrome Neg Hx   . Venous thrombosis Neg Hx   . Verruca vulgaris  Neg Hx   . Vesicoureteral reflux Neg Hx   . Vision loss Neg Hx   . Vitamin D deficiency Neg Hx   . Vitiligo Neg Hx   . Voice disorder Neg Hx   . Von Gierke's disease Neg Hx   . Von Hippel-Lindau syndrome Neg Hx   . Von Willebrand disease Neg Hx   . Werdnig Hoffman Disease Neg Hx   . Williams syndrome Neg Hx   . Wilm's tumor Neg Hx   . Wilson's disease Neg Hx   . Wiskott-Aldrich syndrome Neg Hx   . Evelene Croon Parkinson White syndrome Neg Hx   . Xeroderma pigmentosa Neg Hx   . Heart attack Mother   . Heart attack Father     Allergies  Allergen Reactions  . Morphine And Related     nausea  . Tramadol Other (See Comments)    unknown  . Penicillins Rash    Objective: Temp:  [97.5 F (36.4 C)-98.4 F (36.9 C)] 97.5 F (36.4 C) (08/10 1012) Pulse Rate:  [65-87] 78 (08/10 1012) Resp:  [20-26] 20 (08/10 1012) BP: (129-151)/(49-58) 151/52 mmHg (08/10 1012) SpO2:  [94 %-99 %] 94 % (08/10 1012) Weight:  [140 lb 11.2 oz (63.821 kg)] 140 lb 11.2 oz (63.821 kg) (08/10 0500)  General: She is alert and  in no distress. She answers questions with simple one-word answers Skin: No rash Lungs: Clear anteriorly Cor: Regular S1 and S2 with no murmurs Abdomen: Soft and nontender   Lab Results Lab Results  Component Value Date   WBC 9.2 05/06/2014   HGB 10.4* 05/06/2014   HCT 32.6* 05/06/2014   MCV 96.4 05/06/2014   PLT 347 05/06/2014    Lab Results  Component Value Date   CREATININE 0.37* 05/06/2014   BUN 14 05/06/2014   NA 139 05/06/2014   K 3.9 05/06/2014   CL 104 05/06/2014   CO2 26 05/06/2014    Lab Results  Component Value Date   ALT 27 04/29/2014   AST 64* 04/29/2014   ALKPHOS 78 04/29/2014   BILITOT 0.2* 04/29/2014      Microbiology: Recent Results (from the past 240 hour(s))  CULTURE, BLOOD (ROUTINE X 2)     Status: None   Collection Time    04/29/14  5:31 PM      Result Value Ref Range Status   Specimen Description BLOOD PICC LINE   Final   Special Requests BOTTLES DRAWN AEROBIC AND ANAEROBIC R5CC B 8CC   Final   Culture  Setup Time     Final   Value: 04/29/2014 22:16     Performed at [facility]   Culture     Final   Value: NO GROWTH 5 DAYS     Performed at [facility]   Report Status 05/05/2014 FINAL   Final  CULTURE, BLOOD (ROUTINE X 2)     Status: None   Collection Time    04/29/14  5:38 PM      Result Value Ref Range Status   Specimen Description BLOOD LEFT ARM   Final   Special Requests BOTTLES DRAWN AEROBIC AND ANAEROBIC [facility]   Final   Culture  Setup Time     Final   Value: 04/29/2014 22:17     Performed at [facility]   Culture     Final   Value: NO GROWTH 5 DAYS     Performed at [facility]   Report Status 05/05/2014 FINAL   Final  URINE CULTURE     Status: None   Collection Time    04/29/14  5:50 PM      Result Value Ref Range Status   Specimen Description URINE, CATHETERIZED   Final   Special Requests NONE   Final   Culture  Setup Time     Final   Value: 04/29/2014 18:17     Performed at [facility]  Count     Final   Value: NO GROWTH     Performed at [facility]   Culture     Final   Value: NO GROWTH     Performed at [facility]   Report Status 04/30/2014 FINAL   Final  MRSA PCR SCREENING     Status: None   Collection Time    04/29/14 10:45 PM      Result Value Ref Range Status   MRSA by PCR NEGATIVE  NEGATIVE Final   Comment:            The GeneXpert MRSA Assay (FDA     approved for NASAL specimens     only), is one component of a     comprehensive MRSA colonization     surveillance program. It is not     intended to diagnose MRSA     infection nor to guide or     monitor treatment for     MRSA infections.  URINE CULTURE     Status: None   Collection Time    05/01/14  9:06 AM      Result Value Ref Range Status   Specimen Description URINE, CATHETERIZED   Final   Special Requests rocephin Immunocompromised   Final   Culture  Setup Time     Final   Value: 05/01/2014 16:08     Performed at [facility] Count     Final   Value: NO GROWTH     Performed at [facility]   Culture     Final   Value: NO GROWTH     Performed at [facility]   Report Status 05/02/2014 FINAL   Final  CULTURE, BLOOD (ROUTINE X 2)     Status: None   Collection Time    05/01/14 10:10 AM      Result Value Ref Range Status   Specimen Description BLOOD LEFT ARM   Final   Special Requests BOTTLES DRAWN AEROBIC AND ANAEROBIC 10CC   Final  Culture  Setup Time     Final   Value: 05/01/2014 16:10     Performed at [facility]   Culture     Final   Value:        BLOOD CULTURE RECEIVED NO GROWTH TO DATE CULTURE WILL BE HELD FOR 5 DAYS BEFORE ISSUING A FINAL NEGATIVE REPORT     Performed at [facility]   Report Status PENDING   Incomplete  CULTURE, BLOOD (ROUTINE X 2)     Status: None   Collection Time    05/01/14 10:20 AM      Result Value Ref Range Status   Specimen Description BLOOD LEFT HAND   Final   Special Requests BOTTLES  DRAWN AEROBIC ONLY 3CC   Final   Culture  Setup Time     Final   Value: 05/01/2014 16:09     Performed at [facility]   Culture     Final   Value:        BLOOD CULTURE RECEIVED NO GROWTH TO DATE CULTURE WILL BE HELD FOR 5 DAYS BEFORE ISSUING A FINAL NEGATIVE REPORT     Performed at [facility]   Report Status PENDING   Incomplete  CLOSTRIDIUM DIFFICILE BY PCR     Status: Abnormal   Collection Time    05/02/14 11:56 AM      Result Value Ref Range Status   C difficile by pcr POSITIVE (*) NEGATIVE Final   Comment: CRITICAL RESULT CALLED TO, READ BACK BY AND VERIFIED WITH:     EASON,K RN [id number] 03/04/14 LEONARD,A    Studies/Results: Dg Chest 1 View  05/05/2014   CLINICAL DATA:  80 year old female with lethargy, possible aspiration. Initial encounter.  EXAM: CHEST - 1 VIEW  COMPARISON:  05/02/2014 and earlier.  FINDINGS: Portable AP semi upright view at 1136 hrs. Veiling opacity at the right lung base mildly increased. Increased dense retrocardiac opacity. Stable cardiac size and mediastinal contours. Stable right PICC line. Visualized tracheal air column is within normal limits. No pneumothorax or pulmonary edema. The left lung base is stable and clear allowing for portable technique.  IMPRESSION: Increased opacity at the right lung base such that interval aspiration is not excluded, but may simply reflect increased pleural effusion and atelectasis.   Electronically Signed   By: Augusto Gamble [name]   On: 05/05/2014 12:46   Ct Head Wo Contrast  05/05/2014   CLINICAL DATA:  80 year old female with worsening mental status, no longer arousable. Initial encounter.  EXAM: CT HEAD WITHOUT CONTRAST  TECHNIQUE: Contiguous axial images were obtained from the base of the skull through the vertex without intravenous contrast.  COMPARISON:  Head CT without contrast 05/03/2014. Brain MRI 04/30/2014.  FINDINGS: Stable visualized osseous structures. Stable paranasal sinuses and mastoids. Stable orbit and  scalp soft tissues. Calcified atherosclerosis at the skull base.  Stable mostly low-density left subdural hematoma associated with 4-5 mm of rightward midline shift. Isodense, but no hyperdense blood products. Stable ventricle size and configuration. No new intracranial hemorrhage identified. Patchy and confluent white matter hypodensity, stable. Small chronic lacunar infarcts in the cerebellum. No evidence of cortically based acute infarction identified. No suspicious intracranial vascular hyperdensity.  IMPRESSION: Stable, with mild left subdural hematoma and unchanged 4-5 mm of rightward midline shift. No new intracranial abnormality.   Electronically Signed   By: Augusto Gamble [name]   On: 05/05/2014 11:18    Assessment: She was started back on ceftriaxone yesterday for  possible aspiration pneumonia. Her chest x-ray shows a slight increase in her right base density. Since any systemic antibiotic is likely to make it more difficult to cure her C. difficile colitis I would limit therapy with ceftriaxone to 5 days total. It would certainly be best to put her back on oral vancomycin for her colitis if she passes her swallowing test or has a pain and a feeding tube placed.  Plan: 1. Recommend only 5 days of ceftriaxone 2. Restart oral vancomycin once she has passed her swallowing study or has a feeding tube placed. Continue vancomycin 12 more days to give her one week after stopping ceftriaxone 3. I will sign off now but please call if I can be of further assistance while she is here  Cliffton Asters, MD [facility] [phone] pager   [phone] cell 05/06/2014, 2:30 PM

## 2014-05-06 NOTE — Progress Notes (Signed)
Subjective: Much improved compared to yesterday.   Exam: Filed Vitals:   05/06/14 0550  BP: 142/58  Pulse: 75  Temp: 97.8 F (36.6 C)  Resp: 20   Gen: In bed, NAD MS: Awake, anble to make simple sentences, able to name simple objects.  VW:PVXYI, cross midline in both directions, but does not look fully in either direction.  Motor: f/c x 4, no clear weakness Sensory:responds to nox stim x 4.    Impression: 80 yo F with waxing and waning mental status in the setting of SDH, C-Diff colitis. I wonder if her worsening could be due to aspiration, but one other possibility would be that she was post-ictal. With the recurrent episodes of increased lethargy, unresponsiveness I would favor getting prolonged EEG to try to capture episodes/check for subclinical seizures as treating empirically could worsen her sedation.    Recommendations: 1) continue vimpat to 150mg  BID 2) overnight EEG to capture episodes of unresponsiveness or check for subclinical seizures.  3) will continue to follow.   , MD [facility] [phone]  If 7pm- 7am, please page neurology on call as listed in AMION.

## 2014-05-06 NOTE — Progress Notes (Addendum)
Physician Daily Progress Note  Subjective: Pt remains AAOx 1 only  Able to follow commands Reports of severe AMS over the weekend, but ok this AM  Remains intol to PO and unable to work w/ PT  Considering EEG today     Objective: Vital signs in last 24 hours:   Filed Vitals:   05/05/14 2118 05/06/14 0200 05/06/14 0500 05/06/14 0550  BP: 131/51 137/58  142/58  Pulse: 83 65  75  Temp: 98.4 F (36.9 C) 97.5 F (36.4 C)  97.8 F (36.6 C)  TempSrc: Axillary Axillary  Axillary  Resp: 22 26  20   Height:      Weight:   140 lb 11.2 oz (63.821 kg)   SpO2: 96% 95%  99%   Weight change: 1 lb 11.2 oz (0.771 kg) Last BM Date: 05/02/14  CBG (last 3)   Recent Labs  05/05/14 2004 05/06/14 0036 05/06/14 0359  GLUCAP 134* 159* 177*    Intake/Output from previous day:  Intake/Output Summary (Last 24 hours) at 05/06/14 0735 Last data filed at 05/06/14 0502  Gross per 24 hour  Intake      0 ml  Output    550 ml  Net   -550 ml      Physical Exam General appearance:WF in NAD , AAOx1  HEENT:  MMM, no icterus, EOMI Resp:  CTAB crackles at bases  Cardio:  RRR, no MRG  GI: soft, non-tender; bowel sounds normal; no masses,  no organomegaly Extremities: no clubbing, cyanosis or edema Neuro : CN II-XII intact grossly . Able to make sentences and name objects. AAOx1  Skin : 3cm purple , nondraining scar present peri spinally w/o any sign of fluctuance   Labs: Basic Metabolic Panel:  Recent Labs Lab 05/02/14 0510 05/03/14 0503 05/04/14 0429 05/05/14 0400 05/06/14 0500  NA 137 136* 139 137 139  K 3.5* 3.8 3.9 3.7 3.9  CL 102 103 104 100 104  CO2 25 22 24 25 26   GLUCOSE 93 93 125* 99 167*  BUN 8 12 14 12 14   CREATININE 0.51 0.54 0.45* 0.43* 0.37*  CALCIUM 8.4 8.4 8.7 9.2 9.3   GFR Estimated Creatinine Clearance: 51.8 ml/min (by C-G formula based on Cr of 0.37). Liver Function Tests:  Recent Labs Lab 04/29/14 1731  AST 64*  ALT 27  ALKPHOS 78  BILITOT 0.2*   PROT 6.1  ALBUMIN 2.7*   No results found for this basename: LIPASE, AMYLASE,  in the last 168 hours No results found for this basename: AMMONIA,  in the last 168 hours Coagulation profile No results found for this basename: INR, PROTIME,  in the last 168 hours  CBC:  Recent Labs Lab 04/29/14 1731  05/02/14 0510 05/03/14 0503 05/04/14 0429 05/05/14 0400 05/06/14 0500  WBC 8.6  < > 10.4 12.6* 12.7* 13.1* 9.2  NEUTROABS 6.1  --   --   --   --   --   --   HGB 11.3*  < > 10.9* 10.8* 10.9* 11.1* 10.4*  HCT 35.7*  < > 33.7* 33.8* 33.5* 34.1* 32.6*  MCV 98.9  < > 97.4 97.4 96.8 97.4 96.4  PLT 288  < > 288 287 277 328 347  < > = values in this interval not displayed. Cardiac Enzymes: No results found for this basename: CKTOTAL, CKMB, CKMBINDEX, TROPONINI,  in the last 168 hours BNP (last 3 results)  Recent Labs  03/24/14 0430  PROBNP 2706.0*   CBG:  Recent Labs  Lab 05/05/14 1154 05/05/14 1605 05/05/14 2004 05/06/14 0036 05/06/14 0359  GLUCAP 77 99 134* 159* 177*   D-Dimer: No results found for this basename: DDIMER,  in the last 72 hours Hgb A1c: No results found for this basename: HGBA1C,  in the last 72 hours Lipid Profile: No results found for this basename: CHOL, HDL, LDLCALC, TRIG, CHOLHDL, LDLDIRECT,  in the last 72 hours Thyroid function studies: No results found for this basename: TSH, T4TOTAL, FREET3, T3FREE, THYROIDAB,  in the last 72 hours Anemia work up: No results found for this basename: VITAMINB12, FOLATE, FERRITIN, TIBC, IRON, RETICCTPCT,  in the last 72 hours Sepsis Labs:  Recent Labs Lab 04/29/14 1745  05/03/14 0503 05/04/14 0429 05/05/14 0400 05/06/14 0500  WBC  --   < > 12.6* 12.7* 13.1* 9.2  LATICACIDVEN 1.45  --   --   --   --   --   < > = values in this interval not displayed. Microbiology Recent Results (from the past 240 hour(s))  CULTURE, BLOOD (ROUTINE X 2)     Status: None   Collection Time    04/29/14  5:31 PM      Result  Value Ref Range Status   Specimen Description BLOOD PICC LINE   Final   Special Requests BOTTLES DRAWN AEROBIC AND ANAEROBIC R5CC B 8CC   Final   Culture  Setup Time     Final   Value: 04/29/2014 22:16     Performed at [facility]   Culture     Final   Value: NO GROWTH 5 DAYS     Performed at [facility]   Report Status 05/05/2014 FINAL   Final  CULTURE, BLOOD (ROUTINE X 2)     Status: None   Collection Time    04/29/14  5:38 PM      Result Value Ref Range Status   Specimen Description BLOOD LEFT ARM   Final   Special Requests BOTTLES DRAWN AEROBIC AND ANAEROBIC [facility]   Final   Culture  Setup Time     Final   Value: 04/29/2014 22:17     Performed at [facility]   Culture     Final   Value: NO GROWTH 5 DAYS     Performed at [facility]   Report Status 05/05/2014 FINAL   Final  URINE CULTURE     Status: None   Collection Time    04/29/14  5:50 PM      Result Value Ref Range Status   Specimen Description URINE, CATHETERIZED   Final   Special Requests NONE   Final   Culture  Setup Time     Final   Value: 04/29/2014 18:17     Performed at [facility] Count     Final   Value: NO GROWTH     Performed at [facility]   Culture     Final   Value: NO GROWTH     Performed at [facility]   Report Status 04/30/2014 FINAL   Final  MRSA PCR SCREENING     Status: None   Collection Time    04/29/14 10:45 PM      Result Value Ref Range Status   MRSA by PCR NEGATIVE  NEGATIVE Final   Comment:            The GeneXpert MRSA Assay (FDA     approved for NASAL specimens  only), is one component of a     comprehensive MRSA colonization     surveillance program. It is not     intended to diagnose MRSA     infection nor to guide or     monitor treatment for     MRSA infections.  URINE CULTURE     Status: None   Collection Time    05/01/14  9:06 AM      Result Value Ref Range Status   Specimen Description URINE,  CATHETERIZED   Final   Special Requests rocephin Immunocompromised   Final   Culture  Setup Time     Final   Value: 05/01/2014 16:08     Performed at [facility] Count     Final   Value: NO GROWTH     Performed at [facility]   Culture     Final   Value: NO GROWTH     Performed at [facility]   Report Status 05/02/2014 FINAL   Final  CULTURE, BLOOD (ROUTINE X 2)     Status: None   Collection Time    05/01/14 10:10 AM      Result Value Ref Range Status   Specimen Description BLOOD LEFT ARM   Final   Special Requests BOTTLES DRAWN AEROBIC AND ANAEROBIC 10CC   Final   Culture  Setup Time     Final   Value: 05/01/2014 16:10     Performed at [facility]   Culture     Final   Value:        BLOOD CULTURE RECEIVED NO GROWTH TO DATE CULTURE WILL BE HELD FOR 5 DAYS BEFORE ISSUING A FINAL NEGATIVE REPORT     Performed at [facility]   Report Status PENDING   Incomplete  CULTURE, BLOOD (ROUTINE X 2)     Status: None   Collection Time    05/01/14 10:20 AM      Result Value Ref Range Status   Specimen Description BLOOD LEFT HAND   Final   Special Requests BOTTLES DRAWN AEROBIC ONLY 3CC   Final   Culture  Setup Time     Final   Value: 05/01/2014 16:09     Performed at [facility]   Culture     Final   Value:        BLOOD CULTURE RECEIVED NO GROWTH TO DATE CULTURE WILL BE HELD FOR 5 DAYS BEFORE ISSUING A FINAL NEGATIVE REPORT     Performed at [facility]   Report Status PENDING   Incomplete  CLOSTRIDIUM DIFFICILE BY PCR     Status: Abnormal   Collection Time    05/02/14 11:56 AM      Result Value Ref Range Status   C difficile by pcr POSITIVE (*) NEGATIVE Final   Comment: CRITICAL RESULT CALLED TO, READ BACK BY AND VERIFIED WITH:     EASON,K RN [id number] 03/04/14 LEONARD,A     . cefTRIAXone (ROCEPHIN)  IV  1 g Intravenous Q24H  . lacosamide (VIMPAT) IV  150 mg Intravenous Q12H  . levothyroxine  25 mcg Intravenous Once  per day on Mon Fri  . [START ON 05/07/2014] levothyroxine  50 mcg Intravenous Once per day on Sun Tue Wed Thu Sat  . methotrexate  10 mg Oral Once per day on Tue Wed  . methylPREDNISolone (SOLU-MEDROL) injection  40 mg Intravenous Daily  . metronidazole  500 mg Intravenous 3  times per day  . saccharomyces boulardii  250 mg Oral BID   Continuous Infusions: . dextrose 5 % and 0.45% NaCl 75 mL/hr at 05/05/14 1604     Assessment/Plan:  C diff -fever and WBC curve improved. ID rec'd po vanc, however her PO intake is currently prohibiting compliance. Changing to IV flagyl until stable PO route of medication administration is available. Given no medication taken on 8/8, will make 8/9 start date of medication . Duration 2 weeks rec'd  Aphasia/AMS  - most likely cause remains cortical irritation from the SDH, but given lack of improvement, prognosis is now guarded. Neuro following, appreciate their recs, pt on AEDs, considering EEG repeat today or tomorrow given lack of improvement. Repeat the head CT x 2 continues to show no progression of SDH and no further acute findings to explain symptoms  Continued Aspiration - plan is MBS when able per speech .  NPO until that time. All meds are changed to IV if able. On rocephin IV day 2/5 for CXR showing aspiration   Malnourishment - consulting nutrition today. Panda to be placed today at brothers consent. Pall care to discuss GOC w/ patient   Hypothyroid - controlled .   Prior MRSA infection of perispinal lumbar region -per NSG , the 6 weeks of IV abx completed this course of therapy. No further indications   PPx - SCDs   Dispo - to CIR vs SNF when stable .  SW consult placed. CIR is looking like a less possible option as she cannot cooperate fully w/ PT   Spoke w/ brother who is next of kin and she does not have formal HCPOA. He states he is her only next of kin that she actively talks w/. He states she would want to be DNR / DNI and this order will be  placed.   Brother agreed to NG feeding tube, discussing SNF w/ SW, and discussing GOC w/ PC team today . Case discussed and questions answered over phone w/ brother.       LOS: 7 days   Marissa Parsons 05/06/2014, 7:35 AM

## 2014-05-07 LAB — BASIC METABOLIC PANEL
Anion gap: 9 (ref 5–15)
BUN: 14 mg/dL (ref 6–23)
CO2: 27 mEq/L (ref 19–32)
Calcium: 9.4 mg/dL (ref 8.4–10.5)
Chloride: 106 mEq/L (ref 96–112)
Creatinine, Ser: 0.42 mg/dL — ABNORMAL LOW (ref 0.50–1.10)
GFR calc Af Amer: >90 mL/min (ref >90)
GLUCOSE: 107 mg/dL — AB (ref 70–99)
POTASSIUM: 3.7 meq/L (ref 3.7–5.3)
Sodium: 142 mEq/L (ref 137–147)

## 2014-05-07 LAB — CBC
HCT: 31.7 % — ABNORMAL LOW (ref 36.0–46.0)
HEMOGLOBIN: 10.4 g/dL — AB (ref 12.0–15.0)
MCH: 31.1 pg (ref 26.0–34.0)
MCHC: 32.8 g/dL (ref 30.0–36.0)
MCV: 94.9 fL (ref 78.0–100.0)
Platelets: 383 10*3/uL (ref 150–400)
RBC: 3.34 MIL/uL — AB (ref 3.87–5.11)
RDW: 13.5 % (ref 11.5–15.5)
WBC: 11.3 10*3/uL — ABNORMAL HIGH (ref 4.0–10.5)

## 2014-05-07 LAB — CULTURE, BLOOD (ROUTINE X 2)
CULTURE: NO GROWTH
Culture: NO GROWTH

## 2014-05-07 LAB — GLUCOSE, CAPILLARY: Glucose-Capillary: 114 mg/dL — ABNORMAL HIGH (ref 70–99)

## 2014-05-07 MED ORDER — LACOSAMIDE 50 MG PO TABS
150.0000 mg | ORAL_TABLET | Freq: Two times a day (BID) | ORAL | Status: DC
  Administered 2014-05-07 (×2): 150 mg via ORAL
  Filled 2014-05-07 (×3): qty 3

## 2014-05-07 MED ORDER — VANCOMYCIN 50 MG/ML ORAL SOLUTION
125.0000 mg | Freq: Four times a day (QID) | ORAL | Status: DC
  Administered 2014-05-07 – 2014-05-08 (×5): 125 mg via ORAL
  Filled 2014-05-07 (×8): qty 2.5

## 2014-05-07 MED ORDER — SODIUM CHLORIDE 0.9 % IJ SOLN
10.0000 mL | INTRAMUSCULAR | Status: DC | PRN
  Administered 2014-05-07: 10 mL

## 2014-05-07 NOTE — Progress Notes (Signed)
LTM EEG D/C'd per Dr Amada Jupiter. Results pending.

## 2014-05-07 NOTE — Procedures (Addendum)
Electroencephalogram report- LTM  Ordering Physician : Dr. Corliss Blacker EEG number: [phone]    Beginning date or time: 05/06/2014 3:50PM Ending date or time: 05/07/2014 7:30AM  Day of study: day 1  Medications include: Per EMR  MENTAL STATUS (per technician's notes): Lethargic.    HISTORY: This 24 hours of intensive EEG monitoring with simultaneous video monitoring was performed for this patient with Subdural hemorrhage and episodic change in awareness. This EEG was requested to rule out subclinical electrographic seizures.  TECHNICAL DESCRIPTION:  The study consists of a continuous 16-channel multi-montage digital video EEG recording with twenty-one electrodes placed according to the International 10-20 System. Additional leads included eye leads, true temporal leads (T1, T2), and an EKG lead. Activation procedures were not done due to mental status.   REPORT: The background activity in this tracing consisted of theta activity, with best of 6-7 Hz. Intermittently, delta activity was seen independently in the bilateral frontotemporal regions, more prominent on the left compared to right.  Overriding beta range faster frequencies were seen in the frontal leads, more prominent in the left frontocentral region, possibly secondary to breach rhythm. Sharply controlled rhythmic theta activity was also seen in the temporal leads, during drowsiness, likely within normal limits.  No unambiguous epileptiform activity was seen.  No electrographic seizures were seen.  IMPRESSION: This is an abnormal EEG due to: 1) Independent bilateral frontotemporal slowing, left more than right 2) Diffuse background slowing.  CLINICAL CORRELATION: This EEG shows focal neuronal dysfunction, more prominent at the left frontotemporal region. There is also evidence for mild-to-moderate diffuse encephalopathy. No electrographic seizures were seen.  ADDENDUM: Patient's EEG was continued and reviewed till 05/07/2014 4:37PM. No  significant changes or electrographic seizures were seen during the additional time period.

## 2014-05-07 NOTE — Progress Notes (Signed)
OT Cancellation Note  Patient Details Name: CHANDRA ASHER MRN: [id number] DOB: [birth year]   Cancelled Treatment:    Reason Eval/Treat Not Completed: Other (comment) (pt getting EEG)  Earlie Raveling OTR/L [phone] 05/07/2014, 12:53 PM

## 2014-05-07 NOTE — Progress Notes (Signed)
Rehab admissions - I spoke with attending MD who feels patient may be ready for inpatient rehab tomorrow.  I spoke with insurance case Production designer, theatre/television/film.  We will need updated PT and OT notes to demonstrate participation and tolerance of therapies.  Once updated PT and OT is completed, I can then have [employer] insurance case manager review for possible acute inpatient rehab admission.  Call me for questions.  #[phone]

## 2014-05-07 NOTE — Progress Notes (Signed)
Subjective: Continues to be improved. No furtehr episodes of speech difficulty.   Exam: Filed Vitals:   05/07/14 0602  BP: 146/64  Pulse: 76  Temp: 98.1 F (36.7 C)  Resp: 20   Gen: In bed, NAD MS: Awake, able to make simple sentences, able to name simple objects. She has improved repetition from earlier in the hospitalization, though perseverates on repetition some.  AX:ENMMH, crosses midline in both directions, but does not look fully in either direction.  Motor: f/c x 4, no clear weakness Sensory: endorses symmetric sensation   Impression: 80 yo F with waxing and waning mental status in the setting of SDH, C-Diff colitis. I wonder if her worsening could be due to aspiration, but one other possibility would be that she was post-ictal. With the recurrent episodes of increased lethargy, unresponsiveness we have gotten a prolonged EEG to try to capture episodes/check for subclinical seizures as treating empirically could worsen her sedation.   I suspect her worsening was due to delirium secondary to infection rather than recurrent seizure.    Recommendations: 1) continue vimpat to 150mg  BID 2) will follow up EEG.  3) will continue to follow.   , MD [facility] [phone]  If 7pm- 7am, please page neurology on call as listed in AMION.

## 2014-05-07 NOTE — Progress Notes (Signed)
Physical Therapy Treatment Patient Details Name: Marissa Parsons MRN: [id number] DOB: [birth year] Today's Date: 05/07/2014    History of Present Illness Marissa Parsons is an [name] pt presents with falls, memory deficits, speech deficits, visual deficits and recent L3-5 Lami.  Pt found to have lt subdural hematoma.    PT Comments    Pt was seen for transfer to chair with EEG equipment in place.  Pt very agreeable to trying despite her need to have physical and verbal cues for all initiation.  Her sequencing was better with faster response to cues, and gave a better physical effort to sit up bedside and to stand.  Her progression with mobility was observed and assisted by nsg to instruct for transfers back to bed.  Follow Up Recommendations  CIR     Equipment Recommendations  None recommended by PT    Recommendations for Other Services Rehab consult     Precautions / Restrictions Precautions Precautions: Fall;Back Precaution Comments: Laminectomy on 03/06/14. Restrictions Weight Bearing Restrictions: No    Mobility  Bed Mobility Overal bed mobility: Needs Assistance Bed Mobility: Sidelying to Sit;Rolling Rolling: Max assist Sidelying to sit: Max assist          Transfers Overall transfer level: Needs assistance Equipment used: 2 person hand held assist Transfers: Sit to/from [employer] Sit to Stand: +2 physical assistance;Mod assist         General transfer comment: Stood more fully today and needed mainly help to pivot, nsg in to assist swinging hips and PT was able to get pt in upright on chair with pt making effort to support herself laterally  Ambulation/Gait                 Stairs            Wheelchair Mobility    Modified Rankin (Stroke Patients Only)       Balance Overall balance assessment: Needs assistance Sitting-balance support: Bilateral upper extremity supported;Feet supported Sitting balance-Leahy  Scale: Poor Sitting balance - Comments: better able to control with pt having feet set, then min assist to maintain Postural control: Right lateral lean Standing balance support: Bilateral upper extremity supported (and support to R knee to help maintain upright) Standing balance-Leahy Scale: Zero                      Cognition Arousal/Alertness: Awake/alert Behavior During Therapy: Flat affect Overall Cognitive Status: Impaired/Different from baseline Area of Impairment: Orientation;Attention;Memory;Following commands;Safety/judgement;Awareness;Problem solving Orientation Level: Disoriented to;Time Current Attention Level: Focused;Divided Memory: Decreased recall of precautions;Decreased short-term memory Following Commands: Follows one step commands inconsistently Safety/Judgement: Decreased awareness of safety;Decreased awareness of deficits Awareness: Intellectual Problem Solving: Slow processing;Decreased initiation;Difficulty sequencing;Requires verbal cues;Requires tactile cues General Comments: With verbal cues to focus was better able to assist the therapist and come up to stand, mod assist  of 2 for progression to chair with pt maintaining her effort to stand    Exercises      General Comments General comments (skin integrity, edema, etc.): Pt has been able to tolerate the activity and be up in chair with nsg cking on her due to EEG today.  Pt is supporting her posture in chair with R UE pillow and minimal lean after 20 minutes of being there.  Spoke with inpt rehab nsg coming to ck on her for admission to discuss her progress since yesterday      Pertinent Vitals/Pain Pain Assessment: No/denies pain Pain  Score: 0-No pain BP was 124/59, pulse 80 and O2 sat 95% per nsg.    Home Living                      Prior Function            PT Goals (current goals can now be found in the care plan section) Acute Rehab PT Goals Patient Stated Goal: none  stated Progress towards PT goals: Progressing toward goals    Frequency  Min 4X/week    PT Plan Current plan remains appropriate    Co-evaluation             End of Session   Activity Tolerance: Patient limited by fatigue;Other (comment) (weakness in LE's and trunk) Patient left: in chair;with call bell/phone within reach;with nursing/sitter in room     Time: 1610-9604 PT Time Calculation (min): 20 min  Charges:  $Therapeutic Activity: 23-37 mins                    G Codes:      Ivar Drape 2014-05-09, 10:37 AM  Samul Dada, PT MS Acute Rehab Dept. Number: [phone]

## 2014-05-07 NOTE — Progress Notes (Signed)
Physician Daily Progress Note  Subjective: AOOX4 this AM  Doing much better  Tolerating her diet w/o issue  Cooperative to conversation today . Following commands EEG still in place    Objective: Vital signs in last 24 hours:   Filed Vitals:   05/06/14 1816 05/06/14 2200 05/07/14 0141 05/07/14 0602  BP: 130/43 166/62 157/80 146/64  Pulse: 96 81 78 76  Temp: 97.4 F (36.3 C) 97.7 F (36.5 C) 97.5 F (36.4 C) 98.1 F (36.7 C)  TempSrc: Oral Oral Oral Axillary  Resp: 18  18 20   Height:      Weight:      SpO2: 100% 92% 98% 98%   Weight change:  Last BM Date: 05/05/14  CBG (last 3)   Recent Labs  05/06/14 2008 05/06/14 2355 05/07/14 0359  GLUCAP 145* 121* 114*    Intake/Output from previous day:  Intake/Output Summary (Last 24 hours) at 05/07/14 0839 Last data filed at 05/07/14 0603  Gross per 24 hour  Intake    120 ml  Output   1800 ml  Net  -1680 ml      Physical Exam General appearance:WF in NAD , AAOx4  HEENT:  MMM, no icterus, EOMI Resp:  CTAB crackles at bases  Cardio:  RRR, no MRG  GI: soft, non-tender; bowel sounds normal; no masses,  no organomegaly Extremities: no clubbing, cyanosis or edema Neuro : CN II-XII intact grossly  Conversant today. AAOx4 Skin : 3cm purple , nondraining scar present peri spinally w/o any sign of fluctuance   Labs: Basic Metabolic Panel:  Recent Labs Lab 05/03/14 0503 05/04/14 0429 05/05/14 0400 05/06/14 0500 05/07/14 0525  NA 136* 139 137 139 142  K 3.8 3.9 3.7 3.9 3.7  CL 103 104 100 104 106  CO2 22 24 25 26 27   GLUCOSE 93 125* 99 167* 107*  BUN 12 14 12 14 14   CREATININE 0.54 0.45* 0.43* 0.37* 0.42*  CALCIUM 8.4 8.7 9.2 9.3 9.4   GFR Estimated Creatinine Clearance: 51.8 ml/min (by C-G formula based on Cr of 0.42). Liver Function Tests: No results found for this basename: AST, ALT, ALKPHOS, BILITOT, PROT, ALBUMIN,  in the last 168 hours No results found for this basename: LIPASE, AMYLASE,  in the  last 168 hours No results found for this basename: AMMONIA,  in the last 168 hours Coagulation profile No results found for this basename: INR, PROTIME,  in the last 168 hours  CBC:  Recent Labs Lab 05/03/14 0503 05/04/14 0429 05/05/14 0400 05/06/14 0500 05/07/14 0525  WBC 12.6* 12.7* 13.1* 9.2 11.3*  HGB 10.8* 10.9* 11.1* 10.4* 10.4*  HCT 33.8* 33.5* 34.1* 32.6* 31.7*  MCV 97.4 96.8 97.4 96.4 94.9  PLT 287 277 328 347 383   Cardiac Enzymes: No results found for this basename: CKTOTAL, CKMB, CKMBINDEX, TROPONINI,  in the last 168 hours BNP (last 3 results)  Recent Labs  03/24/14 0430  PROBNP 2706.0*   CBG:  Recent Labs Lab 05/06/14 1121 05/06/14 1608 05/06/14 2008 05/06/14 2355 05/07/14 0359  GLUCAP 139* 160* 145* 121* 114*   D-Dimer: No results found for this basename: DDIMER,  in the last 72 hours Hgb A1c: No results found for this basename: HGBA1C,  in the last 72 hours Lipid Profile: No results found for this basename: CHOL, HDL, LDLCALC, TRIG, CHOLHDL, LDLDIRECT,  in the last 72 hours Thyroid function studies: No results found for this basename: TSH, T4TOTAL, FREET3, T3FREE, THYROIDAB,  in the last 72 hours  Anemia work up: No results found for this basename: VITAMINB12, FOLATE, FERRITIN, TIBC, IRON, RETICCTPCT,  in the last 72 hours Sepsis Labs:  Recent Labs Lab 05/04/14 0429 05/05/14 0400 05/06/14 0500 05/07/14 0525  WBC 12.7* 13.1* 9.2 11.3*   Microbiology Recent Results (from the past 240 hour(s))  CULTURE, BLOOD (ROUTINE X 2)     Status: None   Collection Time    04/29/14  5:31 PM      Result Value Ref Range Status   Specimen Description BLOOD PICC LINE   Final   Special Requests BOTTLES DRAWN AEROBIC AND ANAEROBIC R5CC B 8CC   Final   Culture  Setup Time     Final   Value: 04/29/2014 22:16     Performed at [facility]   Culture     Final   Value: NO GROWTH 5 DAYS     Performed at [facility]   Report Status  05/05/2014 FINAL   Final  CULTURE, BLOOD (ROUTINE X 2)     Status: None   Collection Time    04/29/14  5:38 PM      Result Value Ref Range Status   Specimen Description BLOOD LEFT ARM   Final   Special Requests BOTTLES DRAWN AEROBIC AND ANAEROBIC [facility]   Final   Culture  Setup Time     Final   Value: 04/29/2014 22:17     Performed at [facility]   Culture     Final   Value: NO GROWTH 5 DAYS     Performed at [facility]   Report Status 05/05/2014 FINAL   Final  URINE CULTURE     Status: None   Collection Time    04/29/14  5:50 PM      Result Value Ref Range Status   Specimen Description URINE, CATHETERIZED   Final   Special Requests NONE   Final   Culture  Setup Time     Final   Value: 04/29/2014 18:17     Performed at [facility]     Final   Value: NO GROWTH     Performed at [facility]   Culture     Final   Value: NO GROWTH     Performed at [facility]   Report Status 04/30/2014 FINAL   Final  MRSA PCR SCREENING     Status: None   Collection Time    04/29/14 10:45 PM      Result Value Ref Range Status   MRSA by PCR NEGATIVE  NEGATIVE Final   Comment:            The GeneXpert MRSA Assay (FDA     approved for NASAL specimens     only), is one component of a     comprehensive MRSA colonization     surveillance program. It is not     intended to diagnose MRSA     infection nor to guide or     monitor treatment for     MRSA infections.  URINE CULTURE     Status: None   Collection Time    05/01/14  9:06 AM      Result Value Ref Range Status   Specimen Description URINE, CATHETERIZED   Final   Special Requests rocephin Immunocompromised   Final   Culture  Setup Time     Final   Value: 05/01/2014 16:08     Performed at  [facility]   [place] Count     Final   Value: NO GROWTH     Performed at [facility]   Culture     Final   Value: NO GROWTH     Performed at [facility]   Report  Status 05/02/2014 FINAL   Final  CULTURE, BLOOD (ROUTINE X 2)     Status: None   Collection Time    05/01/14 10:10 AM      Result Value Ref Range Status   Specimen Description BLOOD LEFT ARM   Final   Special Requests BOTTLES DRAWN AEROBIC AND ANAEROBIC 10CC   Final   Culture  Setup Time     Final   Value: 05/01/2014 16:10     Performed at [facility]   Culture     Final   Value: NO GROWTH 5 DAYS     Performed at [facility]   Report Status 05/07/2014 FINAL   Final  CULTURE, BLOOD (ROUTINE X 2)     Status: None   Collection Time    05/01/14 10:20 AM      Result Value Ref Range Status   Specimen Description BLOOD LEFT HAND   Final   Special Requests BOTTLES DRAWN AEROBIC ONLY 3CC   Final   Culture  Setup Time     Final   Value: 05/01/2014 16:09     Performed at [facility]   Culture     Final   Value: NO GROWTH 5 DAYS     Performed at [facility]   Report Status 05/07/2014 FINAL   Final  CLOSTRIDIUM DIFFICILE BY PCR     Status: Abnormal   Collection Time    05/02/14 11:56 AM      Result Value Ref Range Status   C difficile by pcr POSITIVE (*) NEGATIVE Final   Comment: CRITICAL RESULT CALLED TO, READ BACK BY AND VERIFIED WITH:     EASON,K RN [id number] 03/04/14 LEONARD,A     . cefTRIAXone (ROCEPHIN)  IV  1 g Intravenous Q24H  . lacosamide (VIMPAT) IV  150 mg Intravenous Q12H  . lactose free nutrition  237 mL Oral BID BM  . levothyroxine  100 mcg Oral Once per day on Sun Tue Wed Thu Sat  . [START ON 05/10/2014] levothyroxine  50 mcg Oral Once per day on Mon Fri  . methotrexate  10 mg Oral Once per day on Tue Wed  . predniSONE  5 mg Oral Q breakfast  . saccharomyces boulardii  250 mg Oral BID  . vancomycin  125 mg Oral 4 times per day   Continuous Infusions: . dextrose 5 % and 0.45% NaCl 10 mL/hr at 05/06/14 1853     Assessment/Plan:  Marissa Parsons is much better today regarding her mental status. I am very pleased w/ her progress. She is  completing an overnight EEG this morning that neuro will follow up on. Tolerating her AED medications well. Tolerating her diet w/o issue. I have contacted the CIR service to go ahead w/ insurance approval for discharge to CIR , with potential d/c date of tomorrow, as long as she remains mentally stable and nothing new is found on her overnight EEG report . All meds are being changed back to PO .   C diff -fever and WBC curve improved. Resuming po vanc today, given impro/ved swallow function. On day 3/14 with completion date of 8/21  Aphasia/AMS  - SDH has  been noted as stable based on multiple repeat CTs of the head. This was likely 2/2 cortical irritation from the bleed. Neuro on board and appreciate their recs. F/u the overnight EEG. Continue on AEDs. Improving greatly on the AEDs   Continued Aspiration - seems resolved at this point. Passed MBS. On diet per speech . No signs of continued aspiration at this time . if necessary, brother states pt would wish for PEG , fortunately it does not appear that will be necessary at this time. Speech continues to follow  Aspiration pneumonitis - on rocephin day 3/5 . Completion date 8/13  Malnourishment - . Added boost per nutrition  Hypothyroid - controlled .   Prior MRSA infection of perispinal lumbar region -per NSG , the 6 weeks of IV abx completed this course of therapy. No further indications   PPx - SCDs   Dispo -Given her great improvement, I have consulted CIR coordinator to start the insurance approval process. We should know in the next 24-48 hours if approved. If remains mentally stable, will be ready to d/c to CIR when bed available. FL2 signed and on chart   Pt is DO NOT RESUSCITATE         LOS: 8 days   Keni Elison 05/07/2014, 8:39 AM

## 2014-05-07 NOTE — Progress Notes (Signed)
Rehab admissions - Patient did better with therapies.  I spoke with brother-in-law and he will not be able to provide the amount of care at home needed after a potential inpatient rehab stay.  It is likely that patient will still need SNF even after an inpatient rehab stay.  I will meet with the brother-in-law at 2 pm today.  He mentioned that patient has been to [facility] in the past.  Recommend pursuit of SNF at this point.  Call me for questions.  #[phone]

## 2014-05-07 NOTE — Progress Notes (Signed)
Speech Language Pathology Treatment: Dysphagia  Patient Details Name: Marissa Parsons MRN: [id number] DOB: 10-Feb-1929 Today's Date: 05/07/2014 Time: 1410-1430 SLP Time Calculation (min): 20 min  Assessment / Plan / Recommendation Clinical Impression  Skilled treatment session focused on addressing dysphagia goals.  SLP facilitated session with set up of regular textures and thin liquids via straw.  Of note, patient with intermittent baseline cough today that was present during and after session.   SLP also facilitated session with skilled observation of tolerance and Mod verbal cues to attend to and manage pocketing of regular textures with use of thin liquid sips to reduce oral residue.  Patient also required Mod cues to consume small, single straw sips.  Recommend to continue with current orders.    HPI HPI: 80 y.o. female who is unable to provide history due to being both expressive and receptively aphasic. Per notes, patient sustained a fall back in July 2015 in which she had a small left sided SDH with minimal mass effect. Patient returned to ED on 8/3 due to family member noting she was slower to respond, that her memory was not normal, that she was not able to remember names of people she knew well. On arrival to ED initial CT showed a improved SDH; MRI - Persistent left subdural hematoma, widespread in the left hemisphere. MRI appearance is similar to the CT appearance.  05/05/14 CXR revealed increased opacity in RLL. CT 05/05/14 indicated mild SDH with 4-73mm rightward midline shift.   Pertinent Vitals Pain Assessment: No/denies pain  SLP Plan  Continue with current plan of care    Recommendations Diet recommendations: Dysphagia 3 (mechanical soft);Thin liquid Liquids provided via: Cup;Straw Medication Administration: Whole meds with puree Supervision: Full supervision/cueing for compensatory strategies Compensations: Small sips/bites;Check for pocketing Postural Changes and/or Swallow  Maneuvers: Seated upright 90 degrees;Upright 30-60 min after meal              Oral Care Recommendations: Oral care BID Follow up Recommendations: 24 hour supervision/assistance;Skilled Nursing facility Plan: Continue with current plan of care    GO    Charlane Ferretti., CCC-SLP [phone]  Shaurya Rawdon 05/07/2014, 2:44 PM

## 2014-05-07 NOTE — Progress Notes (Signed)
Rehab admissions - I spoke with [facility] on site reviewer.  Based on current progress, we will not be able to get authorization for acute inpatient rehab admission.  I then met with patient's brother (who may be brother-in-law) for about 1 hour.  Mr. Marissa Parsons would like to talk to an MD about patient progress and what to expect.  He has some medical concerns he wants to discuss with an MD.  Patient's brother does not live with her.  She lives alone.  He helps and a neighbor assists with care when at home.  Brother does admit that he cannot manage as caregiver for patient.  Brother is open to SNF but wants to talk to an MD.  He hopes patient will not be discharged until Thursday.  He says patient has been to [facility] previously and that it is convenient to his home.  Call me for questions.  #[phone]

## 2014-05-07 NOTE — Plan of Care (Signed)
Update on care :  I saw patient this morning and no family was at bedside. I have never seen any family at bedside on daily rounds throughout this entire admission. I again tried to reach out to the brother to provide an update on today's plan and patient status. No answer was obtained. Message was left w/ my personal cell # but no one has attempted to contact me. I received a call from nursing that family is at bedside and asking to speak w/ a docotor. I offered to speak to the family to answer any questions or concerns and they refused. I overheard the family stating that they "do not want to talk to the doctor on the phone. If he wants to talk to me like a man he can come speak to me face to face". I have relayed this situation to the head of the nursing unit and informed her to pass along that I round everyday from [phone] and any family is always welcome to discuss things in person at that time. I have attempted contact and been open on patient plan and progress on a daily basis. Patient remains improved and stable for discharge to SNF tomorrow as planned.   Alysia Penna, MD 05/07/2014 4:41 PM

## 2014-05-08 LAB — CBC
HCT: 35 % — ABNORMAL LOW (ref 36.0–46.0)
HEMOGLOBIN: 11.6 g/dL — AB (ref 12.0–15.0)
MCH: 31.4 pg (ref 26.0–34.0)
MCHC: 33.1 g/dL (ref 30.0–36.0)
MCV: 94.6 fL (ref 78.0–100.0)
Platelets: 403 10*3/uL — ABNORMAL HIGH (ref 150–400)
RBC: 3.7 MIL/uL — ABNORMAL LOW (ref 3.87–5.11)
RDW: 13.7 % (ref 11.5–15.5)
WBC: 9.9 10*3/uL (ref 4.0–10.5)

## 2014-05-08 LAB — BASIC METABOLIC PANEL
Anion gap: 13 (ref 5–15)
BUN: 12 mg/dL (ref 6–23)
CALCIUM: 9.2 mg/dL (ref 8.4–10.5)
CO2: 26 meq/L (ref 19–32)
CREATININE: 0.46 mg/dL — AB (ref 0.50–1.10)
Chloride: 105 mEq/L (ref 96–112)
GFR calc Af Amer: >90 mL/min (ref >90)
GFR calc non Af Amer: 89 mL/min — ABNORMAL LOW (ref >90)
GLUCOSE: 76 mg/dL (ref 70–99)
Potassium: 3.3 mEq/L — ABNORMAL LOW (ref 3.7–5.3)
Sodium: 144 mEq/L (ref 137–147)

## 2014-05-08 MED ORDER — DIAZEPAM 5 MG PO TABS: 5.0000 mg | ORAL_TABLET | Freq: Every evening | ORAL | Status: AC | PRN

## 2014-05-08 MED ORDER — VANCOMYCIN 50 MG/ML ORAL SOLUTION: ORAL | Status: AC

## 2014-05-08 MED ORDER — LACOSAMIDE 150 MG PO TABS: 150.0000 mg | ORAL_TABLET | Freq: Two times a day (BID) | ORAL | Status: DC

## 2014-05-08 MED ORDER — CEFTRIAXONE SODIUM 1 G IJ SOLR: INTRAMUSCULAR | Status: AC

## 2014-05-08 MED ORDER — LACOSAMIDE 50 MG PO TABS: 100.0000 mg | ORAL_TABLET | Freq: Two times a day (BID) | ORAL | Status: DC

## 2014-05-08 MED ORDER — SACCHAROMYCES BOULARDII 250 MG PO CAPS: 250.0000 mg | ORAL_CAPSULE | Freq: Two times a day (BID) | ORAL | Status: AC

## 2014-05-08 MED ORDER — LACOSAMIDE 100 MG PO TABS: 100.0000 mg | ORAL_TABLET | Freq: Two times a day (BID) | ORAL | Status: AC

## 2014-05-08 NOTE — Progress Notes (Signed)
Report called to RN at [facility]. All questions answered.

## 2014-05-08 NOTE — Progress Notes (Signed)
Discharge orders received. Pt for discharge to [facility]. PICC d/c'd by IV team. Pt's family given discharge instructions with verbalized understanding. Family at [facility] transporting pt.

## 2014-05-08 NOTE — Progress Notes (Signed)
Social work to arrange for [facility] to pick up pt at 1400. Family aware of and agree to this plan. IV team called to remove PICC line before 1400.

## 2014-05-08 NOTE — Progress Notes (Signed)
Pt states that she does not want any lunch at this time. She drank 2 sips of Boost and stated that that was enough.

## 2014-05-08 NOTE — Progress Notes (Signed)
Dr Link Snuffer arrived at 0730 to speak with pt brother, brother not present to speak with Dr. MD stayed and spoke with this RN about the disposition plan for pt along with day shift RN.

## 2014-05-08 NOTE — Clinical Social Work Placement (Signed)
Clinical Social Work Department CLINICAL SOCIAL WORK PLACEMENT NOTE 05/08/2014  Patient:  Marissa Parsons, Marissa Parsons  Account Number:  [id number] Admit date:  04/29/2014  Clinical Social Worker:  Mosie Epstein  Date/time:  05/08/2014 11:06 AM  Clinical Social Work is seeking post-discharge placement for this patient at the following level of care:   SKILLED NURSING   (*CSW will update this form in Epic as items are completed)   05/01/2014  Patient/family provided with [facility] Department of Clinical Social Work's list of facilities offering this level of care within the geographic area requested by the patient (or if unable, by the patient's family).  05/01/2014  Patient/family informed of their freedom to choose among providers that offer the needed level of care, that participate in Medicare, Medicaid or managed care program needed by the patient, have an available bed and are willing to accept the patient.  05/01/2014  Patient/family informed of [facility]' ownership interest in [facility], as well as of the fact that they are under no obligation to receive care at this facility.  PASARR submitted to EDS on  PASARR number received on   FL2 transmitted to all facilities in geographic area requested by pt/family on  05/01/2014 FL2 transmitted to all facilities within larger geographic area on   Patient informed that his/her managed care company has contracts with or will negotiate with  certain facilities, including the following:     Patient/family informed of bed offers received:  05/01/2014 Patient chooses bed at [facility] Physician recommends and patient chooses bed at    Patient to be transferred to [facility] on  05/08/2014 Patient to be transferred to facility by [facility] Patient and family notified of transfer on 05/08/2014 Name of family member notified:  Brunetta Genera, pt's brother  The following physician  request were entered in Epic:   Additional Comments: PASARR previously existing.  Marcelline Deist, MSW, [facility] Licensed Clinical Social Worker [phone] and [phone]

## 2014-05-08 NOTE — Progress Notes (Signed)
Per MD, foley catheter can remain in place and PICC line can be discontinued for disposition to [facility]. Son is aware of and agrees to this discharge plan. Son will call when he is ready to meet the pt at [facility]. MD, bedside RN aware. IV team contacted for PICC removal.

## 2014-05-08 NOTE — Discharge Summary (Addendum)
Physician Discharge Summary    Marissa Parsons  MR#: [id number]  DOB:08-27-1929  Date of Admission: 04/29/2014 Date of Discharge: 05/08/2014  Attending Physician:Tejon Gracie  Patient's GMW:[id number], Lorin Picket, MD  Consults:Treatment Team:  Tia Alert, MD Palliative [facility] Social work   Discharge Diagnoses: SDH AMS  c diff colitis Aspiration pneumonitis   Discharge Medications:   Medication List    STOP taking these medications       cefTRIAXone 2 g in dextrose 5 % 50 mL      TAKE these medications       alendronate 70 MG tablet  Commonly known as:  FOSAMAX  Take 70 mg by mouth once a week. Take on Monday.Take with a full glass of water on an empty stomach.     aspirin EC 81 MG tablet  Take 81 mg by mouth daily.     bisoprolol-hydrochlorothiazide 5-6.25 MG per tablet  Commonly known as:  ZIAC  Take 1 tablet by mouth daily.     calcium citrate-vitamin D 315-200 MG-UNIT per tablet  Commonly known as:  CITRACAL+D  Take 1 tablet by mouth daily.     cefTRIAXone 1 G injection  Commonly known as:  ROCEPHIN  Please inject IM at 5PM on 8/12 and 8/13 and then stop     diazepam 5 MG tablet  Commonly known as:  VALIUM  Take 1 tablet (5 mg total) by mouth at bedtime as needed for anxiety or muscle spasms.     diltiazem 240 MG 24 hr capsule  Commonly known as:  CARDIZEM CD  Take 240 mg by mouth daily.     folic acid 1 MG tablet  Commonly known as:  FOLVITE  Take 1 mg by mouth daily.     Lacosamide 100 MG Tabs  Take 1 tablet (100 mg total) by mouth 2 (two) times daily.     levothyroxine 50 MCG tablet  Commonly known as:  SYNTHROID, LEVOTHROID  Take 50-100 mcg by mouth See admin instructions. Take 1 tablets (50 mcg) on Monday and Friday, take 2 tablet (100 mcg) on Sunday, Tuesday, Wednesday, Thursday, Saturday     methotrexate 2.5 MG tablet  Commonly known as:  RHEUMATREX  Take 10 mg by mouth 2 (two) times a week. On Tuesdays and Wednesdays      predniSONE 5 MG tablet  Commonly known as:  DELTASONE  Take 5 mg by mouth daily.     saccharomyces boulardii 250 MG capsule  Commonly known as:  FLORASTOR  Take 1 capsule (250 mg total) by mouth 2 (two) times daily.     vancomycin 50 mg/mL oral solution  Commonly known as:  VANCOCIN  Take q6 hours with stop date of 8/21        Hospital Procedures: Dg Chest 1 View  05/05/2014   CLINICAL DATA:  80 year old female with lethargy, possible aspiration. Initial encounter.  EXAM: CHEST - 1 VIEW  COMPARISON:  05/02/2014 and earlier.  FINDINGS: Portable AP semi upright view at 1136 hrs. Veiling opacity at the right lung base mildly increased. Increased dense retrocardiac opacity. Stable cardiac size and mediastinal contours. Stable right PICC line. Visualized tracheal air column is within normal limits. No pneumothorax or pulmonary edema. The left lung base is stable and clear allowing for portable technique.  IMPRESSION: Increased opacity at the right lung base such that interval aspiration is not excluded, but may simply reflect increased pleural effusion and atelectasis.   Electronically Signed   By: Augusto Gamble  [name]   On: 05/05/2014 12:46   Ct Head Wo Contrast  05/05/2014   CLINICAL DATA:  80 year old female with worsening mental status, no longer arousable. Initial encounter.  EXAM: CT HEAD WITHOUT CONTRAST  TECHNIQUE: Contiguous axial images were obtained from the base of the skull through the vertex without intravenous contrast.  COMPARISON:  Head CT without contrast 05/03/2014. Brain MRI 04/30/2014.  FINDINGS: Stable visualized osseous structures. Stable paranasal sinuses and mastoids. Stable orbit and scalp soft tissues. Calcified atherosclerosis at the skull base.  Stable mostly low-density left subdural hematoma associated with 4-5 mm of rightward midline shift. Isodense, but no hyperdense blood products. Stable ventricle size and configuration. No new intracranial hemorrhage identified. Patchy and  confluent white matter hypodensity, stable. Small chronic lacunar infarcts in the cerebellum. No evidence of cortically based acute infarction identified. No suspicious intracranial vascular hyperdensity.  IMPRESSION: Stable, with mild left subdural hematoma and unchanged 4-5 mm of rightward midline shift. No new intracranial abnormality.   Electronically Signed   By: Augusto Gamble [name]   On: 05/05/2014 11:18   Ct Head Wo Contrast  05/03/2014   CLINICAL DATA:  Traumatic subdural hemorrhage with worsening somnolence  EXAM: CT HEAD WITHOUT CONTRAST  TECHNIQUE: Contiguous axial images were obtained from the base of the skull through the vertex without intravenous contrast.  COMPARISON:  Prior head CT 04/29/2014  FINDINGS: Evolution of left subdural hemorrhage. The hemorrhage has spread out over the left temporal cortex. No significant new mass effect or progression of the 3 mm of midline shift. Global atrophy and advanced chronic microvascular ischemic white matter disease are also similar compared to prior. No evidence of acute infarct, new hemorrhage, mass or mass effect.  IMPRESSION: 1. Stable left subdural hemorrhage with expected evolution compared to 04/29/2014. 2. Stable 3 mm left-to-right midline shift. 3. No new acute intracranial abnormality.   Electronically Signed   By: Malachy Moan [name]   On: 05/03/2014 08:00   Ct Head Wo Contrast  04/29/2014   CLINICAL DATA:  Altered mental status, recent urinary tract infection.  EXAM: CT HEAD WITHOUT CONTRAST  TECHNIQUE: Contiguous axial images were obtained from the base of the skull through the vertex without intravenous contrast.  COMPARISON:  CT head April 18, 2014  FINDINGS: Degenerating left holohemispheric subdural hematoma previously up to 8.4 mm, now 8 mm, without Re bleed. 3 mm left-to-right midline shift, similar.  The ventricles and sulci are overall normal for age. No intraparenchymal hemorrhage, mass effect nor midline shift. Confluent supratentorial  white matter hypodensities again noted. No acute large vascular territory infarcts. Remote left cerebellar infarcts.  Basal cisterns are patent. Moderate calcific atherosclerosis of the carotid siphons.  No skull fracture. The included ocular globes and orbital contents are non-suspicious. Status post bilateral ocular lens implants. Partially imaged left maxillary sinus bony wall thickening mucosal thickening, consistent with chronic sinusitis. No paranasal sinus air-fluid levels. Mastoid air cells are well aerated.  IMPRESSION: Degenerating left holohemispheric contracting subdural hematoma, 3 mm residual left-to-right midline shift without rebleed.  Involutional changes. Severe white matter changes suggest chronic small vessel ischemic disease with remote is left cerebellar infarct.  Findings discussed with and reconfirmed by CHRISTOPHER LAWYER on8/3/2015at6:15 pm.   Electronically Signed   By: Awilda Metro   On: 04/29/2014 18:18   Ct Head Wo Contrast  04/18/2014   CLINICAL DATA:  Status post fall.  With laceration above the left by  EXAM: CT HEAD WITHOUT CONTRAST  TECHNIQUE: Contiguous axial images were  obtained from the base of the skull through the vertex without intravenous contrast.  COMPARISON:  Noncontrast CT scan of the brain of January 15, 2013  FINDINGS: There is mild diffuse cerebral and cerebellar atrophy with compensatory ventriculomegaly. There is extensive decreased density in the deep white matter of both cerebral hemispheres consistent with chronic small vessel ischemia. There is no intracranial hemorrhage nor acute ischemic change. The cerebellum and brainstem exhibit no acute abnormalities.  The observed paranasal sinuses exhibit no air-fluid levels. There are chronic changes of expansile remodeling associated with the floor of the left orbit and the adjacent left maxillary sinus. This may be related and chronic sinusitis or to entities such as fibrous dysplasia. The orbital structures are  normal. There is no acute skull fracture.  IMPRESSION: 1. There is no acute intracranial hemorrhage nor other acute intracranial abnormality. 2. There are extensive changes of chronic small vessel ischemia which are stable. 3. There is no acute skull fracture. 4. The visualized orbital structures are unremarkable.   Electronically Signed   By: David  [place]   On: 04/18/2014 18:51   Mr Brain Wo Contrast  04/30/2014   CLINICAL DATA:  80 year old female status post spine surgery complicated by infection. Progressive confusion and weakness. Initial encounter. Subdural hematoma discovered in July.  EXAM: MRI HEAD WITHOUT CONTRAST  TECHNIQUE: Multiplanar, multiecho pulse sequences of the brain and surrounding structures were obtained without intravenous contrast.  COMPARISON:  Head CTs 04/29/2014 and earlier.  FINDINGS: Study is intermittently degraded by motion artifact despite repeated imaging attempts.  Broad-based left side subdural hematoma measures up to 7-8 mm in thickness, appearance on MRI similar to the CT appearance 04/18/2014. Mild rightward midline shift of up to 5 mm persists (4 mm at a comparable level on 04/18/2014).  No superimposed restricted diffusion or evidence of acute infarction. No ventriculomegaly or intraventricular hemorrhage. No other intracranial mass lesion identified. Patent basilar cisterns. Negative pituitary, cervicomedullary junction and visualized cervical spine. Patchy and confluent cerebral white matter T2 and FLAIR hyperintensity. Moderate T2 heterogeneity in the deep gray matter nuclei and brainstem. Occasional small chronic lacunar infarcts in the left cerebellar hemisphere. Visible internal auditory structures appear normal. Major intracranial vascular flow voids are preserved.  Mastoids are clear. Stable paranasal sinuses, chronic mucoperiosteal thickening of the left maxillary. Postoperative changes to the globes. Visualized scalp soft tissues are within normal limits.   IMPRESSION: 1. Persistent left subdural hematoma, widespread in the left hemisphere. MRI appearance today is similar to the CT appearance on 04/18/2014, with blood products measuring up to 8 mm and 5 mm of rightward midline shift. 2. No new intracranial abnormality identified. 3. Moderate signal changes in the brain compatible with chronic small vessel disease.   Electronically Signed   By: Augusto Gamble [name]   On: 04/30/2014 12:31   Dg Chest Port 1 View  05/01/2014   CLINICAL DATA:  Possible aspiration.  EXAM: PORTABLE CHEST - 1 VIEW  COMPARISON:  04/29/2014  FINDINGS: Right upper extremity PICC, tip at the level of the SVC.  No cardiomegaly. Mediastinal contours are distorted by rightward rotation.  Mild, new interstitial coarsening asymmetric at the right base.  Remote right humeral neck fracture.  IMPRESSION: Suspect mild pneumonitis in the right lower lobe.   Electronically Signed   By: Tiburcio Pea [name]   On: 05/01/2014 01:29   Dg Chest Port 1 View  04/29/2014   CLINICAL DATA:  Altered mental status  EXAM: PORTABLE CHEST - 1 VIEW  COMPARISON:  03/24/2014  FINDINGS: Right arm PICC tip in the mid SVC.  Improved aeration in the lung bases since the prior study. Small right effusion and minimal right lower lobe atelectasis have improved. Negative for heart failure.  IMPRESSION: Improved aeration in the lung bases. Small right effusion and mild right lower lobe atelectasis persist.   Electronically Signed   By: Marlan Palau [name]   On: 04/29/2014 19:01   Dg Chest Port 1v Same Day  05/02/2014   CLINICAL DATA:  Fever  EXAM: PORTABLE CHEST - 1 VIEW SAME DAY  COMPARISON:  05/01/2014  FINDINGS: Cardiomediastinal silhouette is stable. Question small right pleural effusion. Hazy right basilar atelectasis or infiltrate. Stable right arm PICC line position. No pulmonary edema.  IMPRESSION: Small right pleural effusion. Hazy right basilar atelectasis or infiltrate. No pulmonary edema.   Electronically Signed   By:  Natasha Mead [name]   On: 05/02/2014 14:26   Dg Swallowing Func-speech Pathology  05/06/2014   Ophelia Shoulder, CCC-SLP     05/06/2014  3:17 PM Objective Swallowing Evaluation: Modified Barium Swallowing Study   Patient Details  Name: Marissa Parsons MRN: [id number] Date of Birth: Nov 07, 1928  Today's Date: 05/06/2014 Time: 1400-1420 SLP Time Calculation (min): 20 min  Past Medical History:  Past Medical History  Diagnosis Date  . Hypertension   . Rheumatoid arteritis   . Phlebitis   . Deep vein thrombophlebitis of leg   . Fractured pelvis     fall 2011  . Thyroid disease     hypothyroidism  . Humerus fracture   . Thrombocytopenia   . Leukocytosis   . Hypothyroidism   . Phlebitis      Recurrent phlebitis  . Hematoma      Right superior and inferior pubic rami fractures with hematoma  adjacent to the right ramus fracture  . Urinary tract infection   . Humerus fracture      Right proximal humerus fracture  . Osteoarthritis     for which she takes chronic prednisone  . Macular degeneration   . Chronic anticoagulation   . Fracture of multiple pubic rami 01/16/2013  . Dementia     MILD   Past Surgical History:  Past Surgical History  Procedure Laterality Date  . Left hip open reduction    . Cholecystectomy    . Abdominal hysterectomy  s  . Sinus surgery with instatrak    . Lumbar laminectomy/decompression microdiscectomy N/A 03/06/2014    Procedure: LUMBAR LAMINECTOMY/DECOMPRESSION LUMBAR THREE, FOUR,  AND FIVE ;  Surgeon: Tia Alert, MD;  Location: [facility];   Service: Neurosurgery;  Laterality: N/A;  LUMBAR  LAMINECTOMY/DECOMPRESSION LUMBAR THREE, FOUR, AND FIVE   . Lumbar wound debridement N/A 03/21/2014    Procedure: Incision and drainage of lumbar wound;  Surgeon:  Tia Alert, MD;  Location: [facility];  Service:  Neurosurgery;  Laterality: N/A;   HPI:  80 y.o. female who is unable to provide history due to being both  expressive and receptively aphasic. Per notes, patient sustained  a fall back in July 2015 in  which she had a small left sided SDH  with minimal mass effect. Patient returned to ED on 8/3 due to  family member noting she was slower to respond, that her memory  was not normal, that she was not able to remember names of people  she knew well. On arrival to ED initial CT showed a improved SDH;  MRI - Persistent left subdural  hematoma, widespread in the left  hemisphere. MRI appearance is similar to the CT appearance.   05/05/14 CXR revealed increased opacity in RLL. CT 05/05/14 indicated  mild SDH with 4-34mm rightward midline shift.     Assessment / Plan / Recommendation Clinical Impression  Dysphagia Diagnosis: Mild oral phase dysphagia;Mild pharyngeal  phase dysphagia Clinical impression: Patient presents with mild oral and  pharyngeal impairments which appear to be impacted by her  cognition resulting in prolonged mastication of regular textures  and penetration of thin liquids x1 as a result of a delayed  swallow initiation with liquids spilling to the pyriform sinuses  and patient being distracted.  Patient requires Max assist to  problem solve self-feeding and as a result, requires full  supervision with PO intake.  Recommend initiation of a Dys 3 soft  solid diet with thin liquids and medication whole in puree.   Recommend SLP to follow acutely for diet toleration and  advancement.       Treatment Recommendation  Therapy as outlined in treatment plan below    Diet Recommendation Dysphagia 3 (Mechanical Soft);Thin liquid   Liquid Administration via: Cup;Straw Medication Administration: Whole meds with puree Supervision: Full supervision/cueing for compensatory strategies Compensations: Small sips/bites;Check for pocketing Postural Changes and/or Swallow Maneuvers: Seated upright 90  degrees;Upright 30-60 min after meal    Other  Recommendations Oral Care Recommendations: Oral care BID   Follow Up Recommendations  Inpatient Rehab    Frequency and Duration min 3x week  2 weeks   Pertinent Vitals/Pain None     SLP Swallow Goals  See care plan for details    General Date of Onset: 04/29/14 HPI: 80 y.o. female who is unable to provide history due to being  both expressive and receptively aphasic. Per notes, patient  sustained a fall back in July 2015 in which she had a small left  sided SDH with minimal mass effect. Patient returned to ED on 8/3  due to family member noting she was slower to respond, that her  memory was not normal, that she was not able to remember names of  people she knew well. On arrival to ED initial CT showed a  improved SDH; MRI - Persistent left subdural hematoma, widespread  in the left hemisphere. MRI appearance is similar to the CT  appearance.  05/05/14 CXR revealed increased opacity in RLL. CT  05/05/14 indicated mild SDH with 4-70mm rightward midline shift. Type of Study: Modified Barium Swallowing Study Reason for Referral: Objectively evaluate swallowing function Previous Swallow Assessment: none per recods Diet Prior to this Study: NPO Temperature Spikes Noted: No Respiratory Status: Nasal cannula History of Recent Intubation: No Behavior/Cognition: Alert;Cooperative;Pleasant mood;Requires  cueing Oral Cavity - Dentition: Dentures, top;Dentures, bottom Self-Feeding Abilities: Needs assist;Able to feed self Patient Positioning: Upright in chair Baseline Vocal Quality: Low vocal intensity Volitional Cough: Cognitively unable to elicit Volitional Swallow: Unable to elicit Anatomy: Within functional limits Pharyngeal Secretions: Not observed secondary MBS    Reason for Referral Objectively evaluate swallowing function   Oral Phase Oral Preparation/Oral Phase Oral Phase: Impaired Oral - Solids Oral - Mechanical Soft: Within functional limits Oral - Regular: Other (Comment) (prolonged oral phase)   Pharyngeal Phase Pharyngeal Phase Pharyngeal Phase: Impaired Pharyngeal - Thin Pharyngeal - Thin Cup: Premature spillage to valleculae;Delayed  swallow initiation;Penetration/Aspiration during swallow  Penetration/Aspiration details (thin cup): Material enters  airway, remains ABOVE vocal cords and not ejected out (x1 due to  distractibility ) Pharyngeal - Thin Straw: Premature  spillage to pyriform  sinuses;Delayed swallow initiation Penetration/Aspiration details (thin straw): Material does not  enter airway  Cervical Esophageal Phase    GO    Cervical Esophageal Phase Cervical Esophageal Phase: Impaired Cervical Esophageal Phase - Solids Puree: Other (Comment) (residue above CP segment)        Charlane Ferretti., CCC-SLP [phone]  Marissa Parsons,Marissa Parsons 05/06/2014, 3:15 PM     History of Present Illness: Pt is a 80F who presented on 8/3 with worsening altered mental status. She was noted to have a fall on 7/23. On presentation, she was found to SDH w/ 4mm midline shift. She was admitted for further w/u.   Hospital Course: AMS - The cause of her AMS remained difficult to determine. The presenting SDH was followed by serial CT head and found to remain stable. NSG felt the midline shift was minimal, and the likelihood that this was causing her symptoms was minimal. They felt intervention was not necessary or recommended b/c the risk outweighed the potential benefit. Neurology was consulted who ruled out CVA w/ MRI brain. It was thought cerebral irritation from the bleed could be resulting in her AMS. She had an EEG, started in vimpat as AED, and had repeat overnight EEG confirming no further epileptic/seizure activity on the AEDs. In addition, her mental status was further temporarily worsened b/c she suffered from c diff infection 2/2 chronic use of rocephin treating a prior lumbar wound infection. The fevers and infection caused a temporary period of worsening AMS, but this improved once fever curve corrected on PO vanc/IV flagyl. At time of discharge, she was AAOx4,was following commands, no further fevers, labs were normal, vitals were normal, and she is stable for discharge to continue rehabilitation w/  PT/OT/Speech at [facility].   Dysphagia - complication of her AMS. Did have period where there was concern she may need feeding tube, which the brother stated she would want if needed, but after her acute c diff infection was controlled her swallowing resumed its normal state and she was tolerating a diet recommended by speech.   Diet recommendations:  Dysphagia 3 (mechanical soft);Thin liquid  Liquids provided via: Cup;Straw  Medication Administration: Whole meds with puree  Supervision: Full supervision/cueing for compensatory strategies  Compensations: Small sips/bites;Check for pocketing  Postural Changes and/or Swallow Maneuvers: Seated upright 90 degrees;Upright 30-60 min after meal   Aspiration pneumonitis : she will be treated w/ 5 days of rocephin. She needs to have IM rocepin 1000mg  administered daily at 1700. The stop date is 8/13 to complete 5 days therapy   C diff : needs to complete a 2 week course of PO vanc per ID recs. Stop date is 8/22 per their recommendations. Fever curve had corrected and leukocytosis had resolved prior to discharge.   Cortical irritation 2/2 SDH : neurology started patient on vimpat 150mg BID. EEG day prior to d/c showed no signs of seizure activity.  Spoke w/ neurology day prior to discharge and we agree that her acute change in mental status over weekend was likely 2/2 infection and not further seizure activity. They feel she is safe for discharge on vimpat at lower dose of 100mg  BID.    Prior lumbar infection: completed 1 month of rocephin for MRSA infection. NSG agreed no further treatment needed.   Malnourishment : added boost plus BID per nutrition recs   Hypothyroidism : TSH was normal on admission. Continue home meds of 50mg  M,F and 100mg  all other days   I have discussed the case w/  her brother and he is in agreement that she is ready for discharge to SNF. He will let the nursing staff know when he is available to meet at [facility]. He does remain  concerned about initial read of the head CT on 04/18/14, but is pleased with the care she has received while admitted on this hospital stay and thanked [place] for all that we do.   Patient is DO NOT RESCUSITATE. Gold form signed and on chart   Day of Discharge Exam BP 168/69  Pulse 92  Temp(Src) 97.6 F (36.4 C) (Oral)  Resp 20  Ht 5\' 7"  (1.702 m)  Wt 135 lb (61.236 kg)  BMI 21.14 kg/m2  SpO2 92%  Physical Exam: General appearance: WF in NAD . AAOx3 (not date)  Eyes: no scleral icterus Throat: oropharynx moist without erythema Resp: CTAB, no wheezing or rales  Cardio: RRR, no MRG  GI: soft, non-tender; bowel sounds normal; no masses,  no organomegaly Extremities: no clubbing, cyanosis or edema  Discharge Labs:  Recent Labs  05/07/14 0525 05/08/14 0527  NA 142 144  K 3.7 3.3*  CL 106 105  CO2 27 26  GLUCOSE 107* 76  BUN 14 12  CREATININE 0.42* 0.46*  CALCIUM 9.4 9.2   No results found for this basename: AST, ALT, ALKPHOS, BILITOT, PROT, ALBUMIN,  in the last 72 hours  Recent Labs  05/07/14 0525 05/08/14 0527  WBC 11.3* 9.9  HGB 10.4* 11.6*  HCT 31.7* 35.0*  MCV 94.9 94.6  PLT 383 403*   Lab Results  Component Value Date   INR 1.02 03/04/2014   INR 1.06 01/16/2013   INR 2.4 01/03/2013   No results found for this basename: CKTOTAL, CKMB, CKMBINDEX, TROPONINI,  in the last 72 hours No results found for this basename: TSH, T4TOTAL, FREET3, T3FREE, THYROIDAB,  in the last 72 hours No results found for this basename: VITAMINB12, FOLATE, FERRITIN, TIBC, IRON, RETICCTPCT,  in the last 72 hours  Discharge instructions: Discharge Instructions   Diet - low sodium heart healthy    Complete by:  As directed      Increase activity slowly    Complete by:  As directed           01-Home or Self Care   Disposition: SNF , [facility]  Follow-up Appts: Follow-up with Dr. Link Snuffer at [facility] after discharge from the skilled nursing facility. Please call  for appt.   Condition on Discharge: stable   Tests Needing Follow-up: [facility]Met in 3 days to ensure stability of potassium   Time spent in discharge (includes decision making & examination of pt): 60 minutes    Signed: Cohl Behrens 05/08/2014, 12:08 PM

## 2014-05-08 NOTE — Clinical Social Work Note (Signed)
CSW faxed discharge summary to [facility]. Discharge packet complete and placed on pt's shadow chart. CSW received [facility] authorization [phone]). CSW updated pt's brother-in-law, Brunetta Genera, regarding pt's discharge. Per [facility] admissions liaison, pt's bed available at 2pm on 05/08/2014. Transportation arranged for 2pm pick-up via EMS ([facility]). RN updated regarding information above.  RN to please call for report at [phone].  Marcelline Deist, MSW, [facility] Licensed Clinical Social Worker [phone] and [phone]

## 2014-05-08 NOTE — Progress Notes (Signed)
NEURO HOSPITALIST PROGRESS NOTE   SUBJECTIVE:                                                                                                                        Awake, interactive. LTM yesterday showed no electrographic seizures but independent bilateral frontotemporal slowing, left more than right as well as diffuse background slowing.  On vimpat 150 mg BID.  OBJECTIVE:                                                                                                                           Vital signs in last 24 hours: Temp:  [97.2 F (36.2 C)-98.2 F (36.8 C)] 97.6 F (36.4 C) (08/12 1051) Pulse Rate:  [86-95] 92 (08/12 1051) Resp:  [16-20] 20 (08/12 1051) BP: (141-168)/(56-71) 168/69 mmHg (08/12 1051) SpO2:  [90 %-99 %] 92 % (08/12 1051) Weight:  [61.236 kg (135 lb)] 61.236 kg (135 lb) (08/12 0546)  Intake/Output from previous day: 08/11 0701 - 08/12 0700 In: 120 [P.O.:120] Out: 2650 [Urine:2650] Intake/Output this shift:   Nutritional status: Dysphagia  Past Medical History  Diagnosis Date  . Hypertension   . Rheumatoid arteritis   . Phlebitis   . Deep vein thrombophlebitis of leg   . Fractured pelvis     fall 2011  . Thyroid disease     hypothyroidism  . Humerus fracture   . Thrombocytopenia   . Leukocytosis   . Hypothyroidism   . Phlebitis      Recurrent phlebitis  . Hematoma      Right superior and inferior pubic rami fractures with hematoma adjacent to the right ramus fracture  . Urinary tract infection   . Humerus fracture      Right proximal humerus fracture  . Osteoarthritis     for which she takes chronic prednisone  . Macular degeneration   . Chronic anticoagulation   . Fracture of multiple pubic rami 01/16/2013  . Dementia     MILD    Neurologic Exam:  MS: Awake, answers questions but disoriented to place-year-month. No dysphasia or frank dysarthria. CN: Pupils equal round and reactive to light,  blinks to eyelid stimulation bilaterally. VFF, does not fully look to either side.  Motor: squeezes  hands bilaterally. Wiggles toes bilaterally.  Sensory: Intact to light touch. Plantars: downgoing. Coordination and gait: no tested.   Lab Results: Lab Results  Component Value Date/Time   CHOL 212* 08/02/2011 11:22 AM   Lipid Panel No results found for this basename: CHOL, TRIG, HDL, CHOLHDL, VLDL, LDLCALC,  in the last 72 hours  Studies/Results: Dg Swallowing Func-speech Pathology  05/06/2014   Ophelia Shoulder, CCC-SLP     05/06/2014  3:17 PM Objective Swallowing Evaluation: Modified Barium Swallowing Study   Patient Details  Name: MADYN FULLEN MRN: [id number] Date of Birth: 1929/02/19  Today's Date: 05/06/2014 Time: 1400-1420 SLP Time Calculation (min): 20 min  Past Medical History:  Past Medical History  Diagnosis Date  . Hypertension   . Rheumatoid arteritis   . Phlebitis   . Deep vein thrombophlebitis of leg   . Fractured pelvis     fall 2011  . Thyroid disease     hypothyroidism  . Humerus fracture   . Thrombocytopenia   . Leukocytosis   . Hypothyroidism   . Phlebitis      Recurrent phlebitis  . Hematoma      Right superior and inferior pubic rami fractures with hematoma  adjacent to the right ramus fracture  . Urinary tract infection   . Humerus fracture      Right proximal humerus fracture  . Osteoarthritis     for which she takes chronic prednisone  . Macular degeneration   . Chronic anticoagulation   . Fracture of multiple pubic rami 01/16/2013  . Dementia     MILD   Past Surgical History:  Past Surgical History  Procedure Laterality Date  . Left hip open reduction    . Cholecystectomy    . Abdominal hysterectomy  s  . Sinus surgery with instatrak    . Lumbar laminectomy/decompression microdiscectomy N/A 03/06/2014    Procedure: LUMBAR LAMINECTOMY/DECOMPRESSION LUMBAR THREE, FOUR,  AND FIVE ;  Surgeon: Tia Alert, MD;  Location: [facility] NEURO [facility]S;   Service: Neurosurgery;  Laterality: N/A;   LUMBAR  LAMINECTOMY/DECOMPRESSION LUMBAR THREE, FOUR, AND FIVE   . Lumbar wound debridement N/A 03/21/2014    Procedure: Incision and drainage of lumbar wound;  Surgeon:  Tia Alert, MD;  Location: [facility] NEURO ORS;  Service:  Neurosurgery;  Laterality: N/A;   HPI:  80 y.o. female who is unable to provide history due to being both  expressive and receptively aphasic. Per notes, patient sustained  a fall back in July 2015 in which she had a small left sided SDH  with minimal mass effect. Patient returned to ED on 8/3 due to  family member noting she was slower to respond, that her memory  was not normal, that she was not able to remember names of people  she knew well. On arrival to ED initial CT showed a improved SDH;  MRI - Persistent left subdural hematoma, widespread in the left  hemisphere. MRI appearance is similar to the CT appearance.   05/05/14 CXR revealed increased opacity in RLL. CT 05/05/14 indicated  mild SDH with 4-22mm rightward midline shift.     Assessment / Plan / Recommendation Clinical Impression  Dysphagia Diagnosis: Mild oral phase dysphagia;Mild pharyngeal  phase dysphagia Clinical impression: Patient presents with mild oral and  pharyngeal impairments which appear to be impacted by her  cognition resulting in prolonged mastication of regular textures  and penetration of thin liquids x1 as a result of a delayed  swallow initiation with  liquids spilling to the pyriform sinuses  and patient being distracted.  Patient requires Max assist to  problem solve self-feeding and as a result, requires full  supervision with PO intake.  Recommend initiation of a Dys 3 soft  solid diet with thin liquids and medication whole in puree.   Recommend SLP to follow acutely for diet toleration and  advancement.       Treatment Recommendation  Therapy as outlined in treatment plan below    Diet Recommendation Dysphagia 3 (Mechanical Soft);Thin liquid   Liquid Administration via: Cup;Straw Medication Administration: Whole  meds with puree Supervision: Full supervision/cueing for compensatory strategies Compensations: Small sips/bites;Check for pocketing Postural Changes and/or Swallow Maneuvers: Seated upright 90  degrees;Upright 30-60 min after meal    Other  Recommendations Oral Care Recommendations: Oral care BID   Follow Up Recommendations  Inpatient Rehab    Frequency and Duration min 3x week  2 weeks   Pertinent Vitals/Pain None    SLP Swallow Goals  See care plan for details    General Date of Onset: 04/29/14 HPI: 80 y.o. female who is unable to provide history due to being  both expressive and receptively aphasic. Per notes, patient  sustained a fall back in July 2015 in which she had a small left  sided SDH with minimal mass effect. Patient returned to ED on 8/3  due to family member noting she was slower to respond, that her  memory was not normal, that she was not able to remember names of  people she knew well. On arrival to ED initial CT showed a  improved SDH; MRI - Persistent left subdural hematoma, widespread  in the left hemisphere. MRI appearance is similar to the CT  appearance.  05/05/14 CXR revealed increased opacity in RLL. CT  05/05/14 indicated mild SDH with 4-20mm rightward midline shift. Type of Study: Modified Barium Swallowing Study Reason for Referral: Objectively evaluate swallowing function Previous Swallow Assessment: none per recods Diet Prior to this Study: NPO Temperature Spikes Noted: No Respiratory Status: Nasal cannula History of Recent Intubation: No Behavior/Cognition: Alert;Cooperative;Pleasant mood;Requires  cueing Oral Cavity - Dentition: Dentures, top;Dentures, bottom Self-Feeding Abilities: Needs assist;Able to feed self Patient Positioning: Upright in chair Baseline Vocal Quality: Low vocal intensity Volitional Cough: Cognitively unable to elicit Volitional Swallow: Unable to elicit Anatomy: Within functional limits Pharyngeal Secretions: Not observed secondary MBS    Reason for Referral  Objectively evaluate swallowing function   Oral Phase Oral Preparation/Oral Phase Oral Phase: Impaired Oral - Solids Oral - Mechanical Soft: Within functional limits Oral - Regular: Other (Comment) (prolonged oral phase)   Pharyngeal Phase Pharyngeal Phase Pharyngeal Phase: Impaired Pharyngeal - Thin Pharyngeal - Thin Cup: Premature spillage to valleculae;Delayed  swallow initiation;Penetration/Aspiration during swallow Penetration/Aspiration details (thin cup): Material enters  airway, remains ABOVE vocal cords and not ejected out (x1 due to  distractibility ) Pharyngeal - Thin Straw: Premature spillage to pyriform  sinuses;Delayed swallow initiation Penetration/Aspiration details (thin straw): Material does not  enter airway  Cervical Esophageal Phase    GO    Cervical Esophageal Phase Cervical Esophageal Phase: Impaired Cervical Esophageal Phase - Solids Puree: Other (Comment) (residue above CP segment)        Fae Pippin, M.A., CCC-SLP [phone]  BOWIE,MELISSA 05/06/2014, 3:15 PM     MEDICATIONS  Scheduled: . cefTRIAXone (ROCEPHIN)  IV  1 g Intravenous Q24H  . lacosamide  150 mg Oral BID  . lactose free nutrition  237 mL Oral BID BM  . levothyroxine  100 mcg Oral Once per day on Sun Tue Wed Thu Sat  . [START ON 05/10/2014] levothyroxine  50 mcg Oral Once per day on Mon Fri  . methotrexate  10 mg Oral Once per day on Tue Wed  . predniSONE  5 mg Oral Q breakfast  . saccharomyces boulardii  250 mg Oral BID  . vancomycin  125 mg Oral 4 times per day    ASSESSMENT/PLAN:                                                                                                            80 year old female with subdural hematoma and initial dysphasia that seems to be gradually improving. LTM without evidence of electrographic seizures. Will suggest decreasing vimpat to 100 mg BID. No further  neurology intervention needed. Please, call neuro with any questions or concerns.  Wyatt Portela, MD [facility] [phone]  05/08/2014, 10:54 AM

## 2014-05-21 ENCOUNTER — Ambulatory Visit: Payer: Medicare HMO | Admitting: Internal Medicine

## 2014-06-27 DEATH — deceased

## 2014-11-18 IMAGING — CT CT HEAD W/O CM
1 of 2 series · 15 of 30 positions shown, 19 images · non-contrast
Comparison: Noncontrast CT scan of the brain January 15, 2013

CLINICAL DATA: Status post fall.  With laceration above the left by

EXAM:
CT HEAD WITHOUT CONTRAST
TECHNIQUE: Contiguous axial images were obtained from the base of the skull
through the vertex without intravenous contrast.

[Series 3: head 2.0 h70h · axial · 0.42mm/px · z∈[-129,+9]mm · 15 of 77 slices shown, 19 images]
[im 4/77  brain]
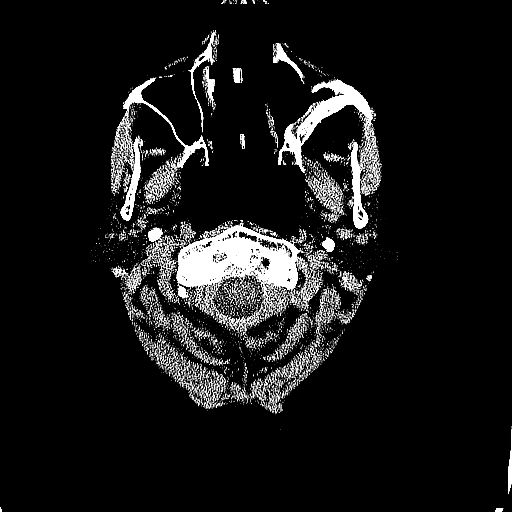
[im 4/77  bone]
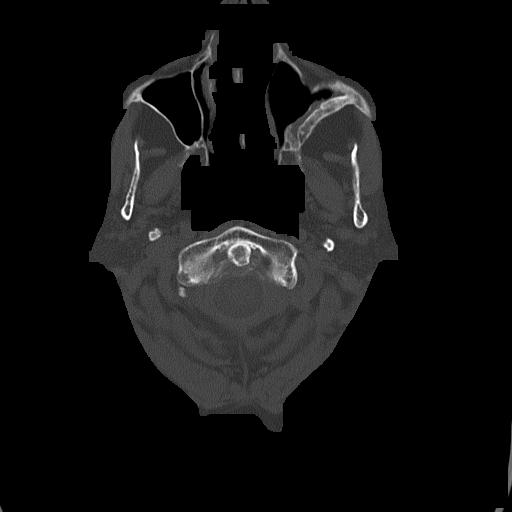
[im 11/77  brain]
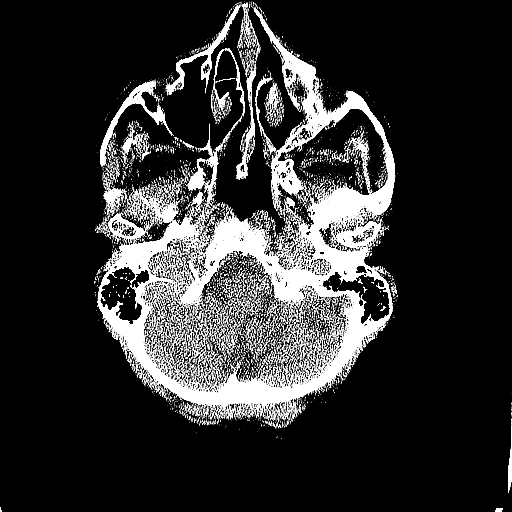
[im 15/77  brain]
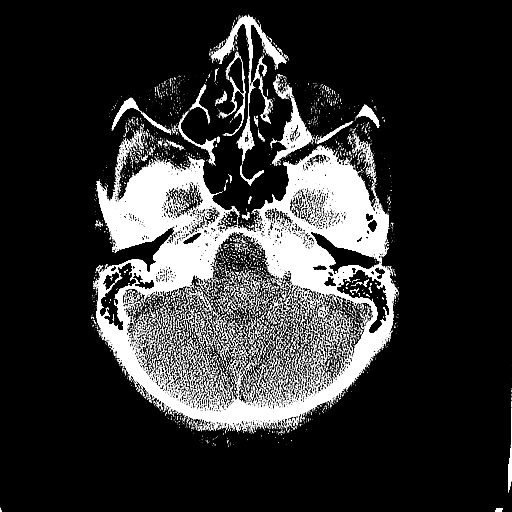
[im 19/77  brain]
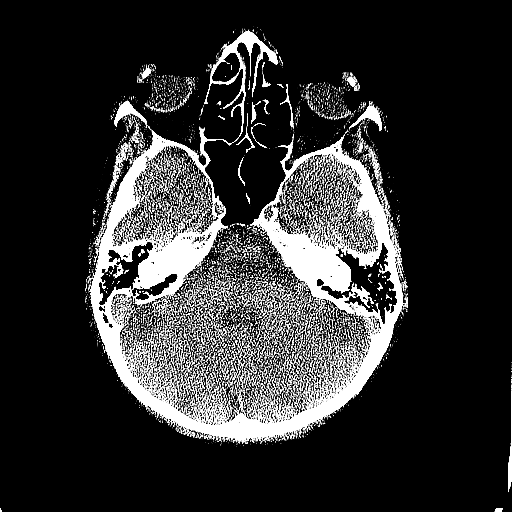
[im 26/77  brain]
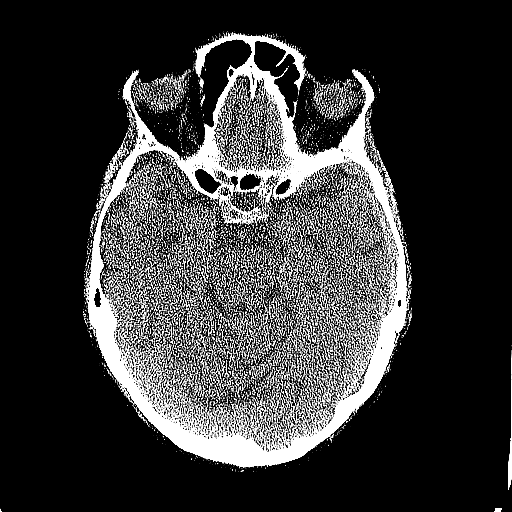
[im 26/77  bone]
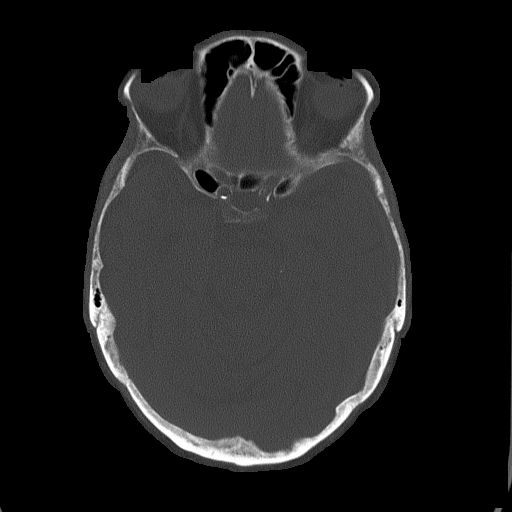
[im 29/77  brain]
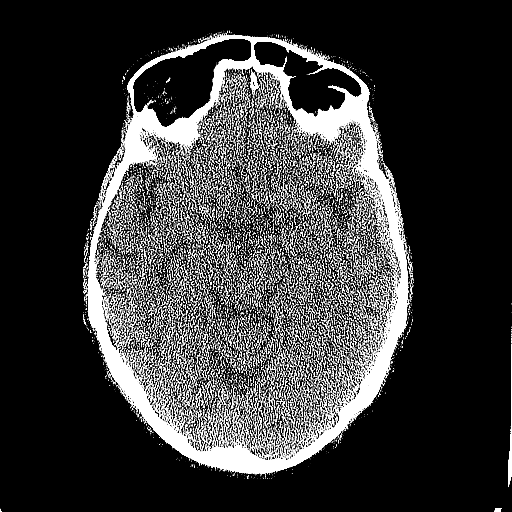
[im 33/77  brain]
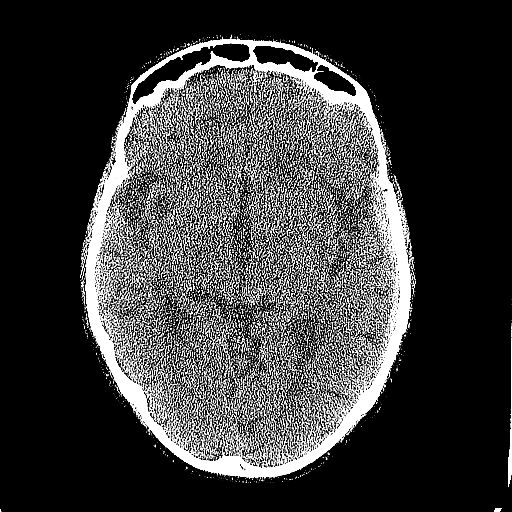
[im 40/77  brain]
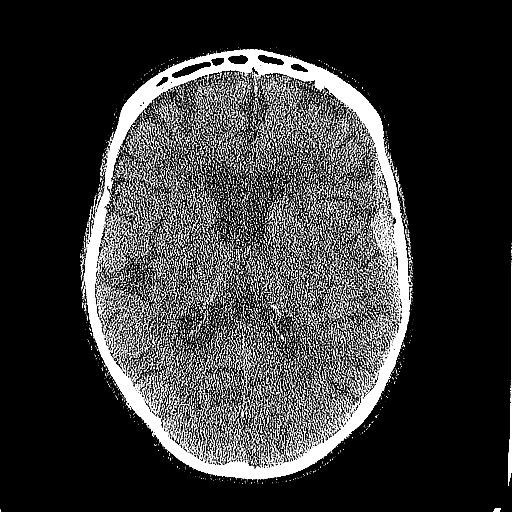
[im 44/77  brain]
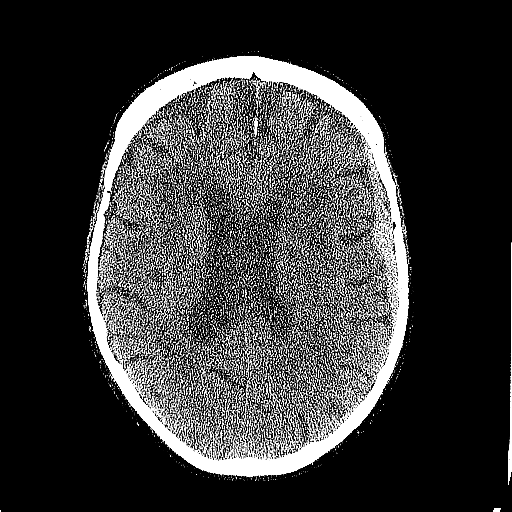
[im 44/77  bone]
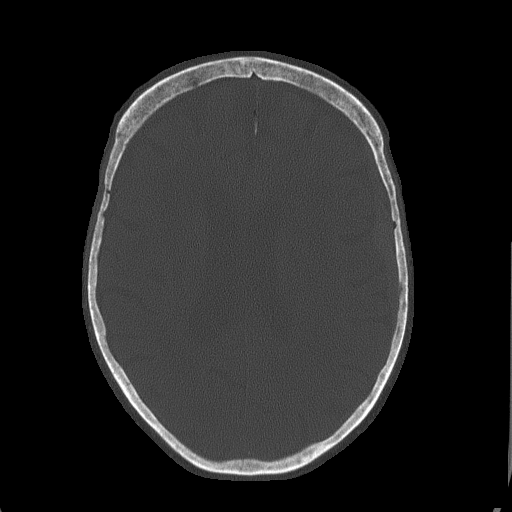
[im 48/77  brain]
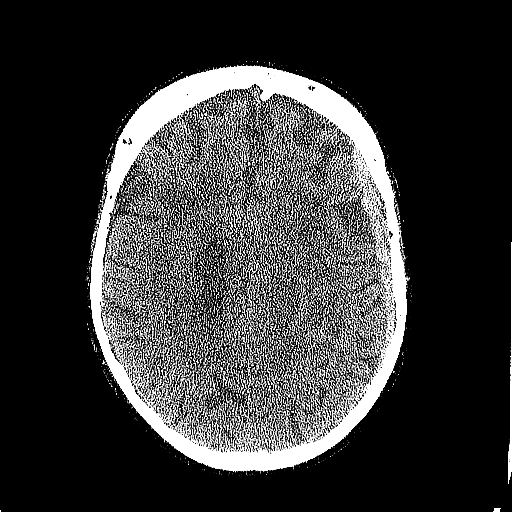
[im 55/77  brain]
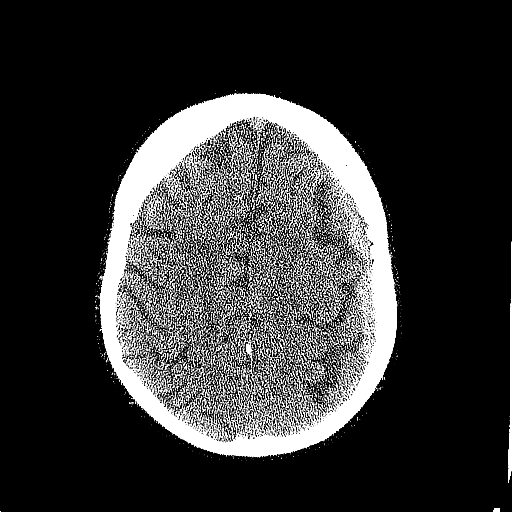
[im 58/77  brain]
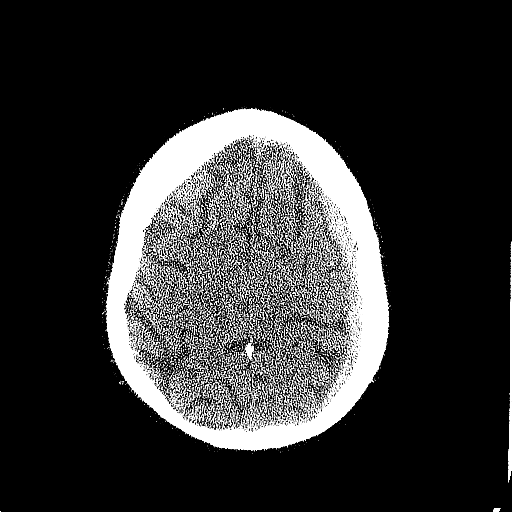
[im 62/77  brain]
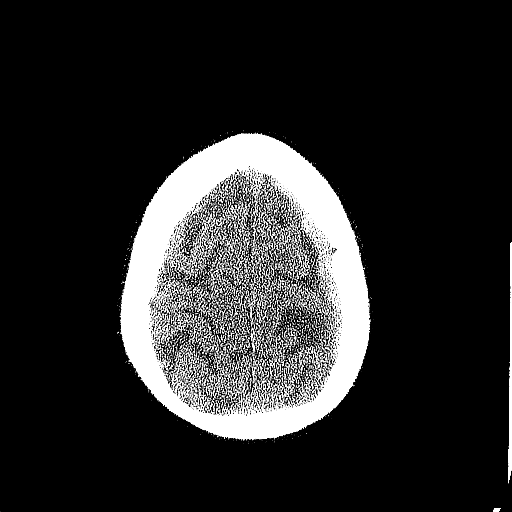
[im 62/77  bone]
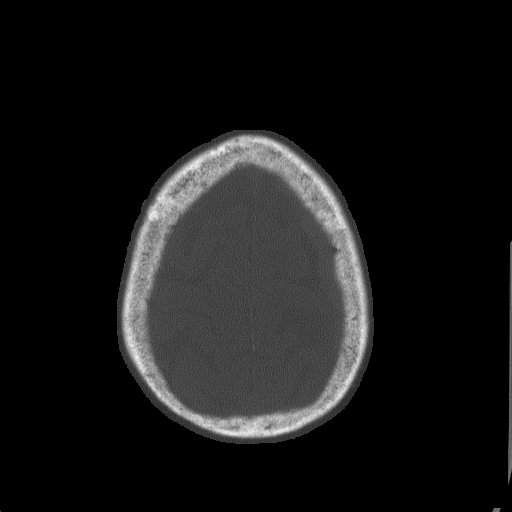
[im 69/77  brain]
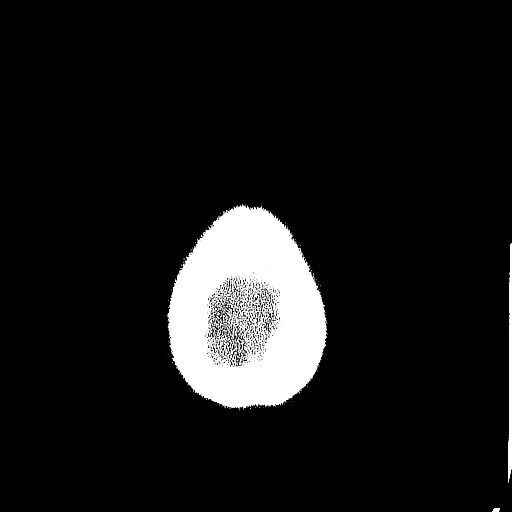
[im 73/77  brain]
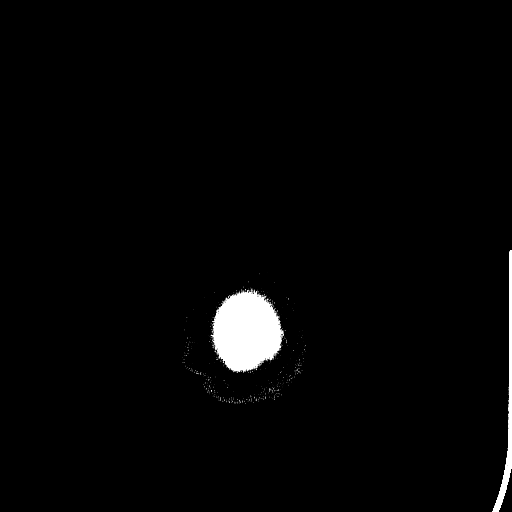

[15 of 30 positions shown; findings below may reference images not displayed]

FINDINGS: There is mild diffuse cerebral and cerebellar atrophy with
compensatory ventriculomegaly. There is extensive decreased density
in the deep white matter of both cerebral hemispheres consistent
with chronic small vessel ischemia. There is no intracranial
hemorrhage nor acute ischemic change. The cerebellum and brainstem
exhibit no acute abnormalities.

The observed paranasal sinuses exhibit no air-fluid levels. There
are chronic changes of expansile remodeling associated with the
floor of the left orbit and the adjacent left maxillary sinus. This
may be related and chronic sinusitis or to entities such as fibrous
dysplasia. The orbital structures are normal. There is no acute
skull fracture.
IMPRESSION: 1. There is no acute intracranial hemorrhage nor other acute
intracranial abnormality.
2. There are extensive changes of chronic small vessel ischemia
which are stable.
3. There is no acute skull fracture.
4. The visualized orbital structures are unremarkable.

## 2014-11-29 IMAGING — CT CT HEAD W/O CM
1 series · 15 of 30 positions shown, 19 images · non-contrast
Comparison: CT head April 18, 2014

CLINICAL DATA: Altered mental status, recent urinary tract
infection.

EXAM:
CT HEAD WITHOUT CONTRAST
TECHNIQUE: Contiguous axial images were obtained from the base of the skull
through the vertex without intravenous contrast.

[Series 2: head 5.0 h30s · axial · 0.44mm/px · z∈[-169,-34]mm · 15 of 30 slices shown, 19 images]
[im 2/30  brain]
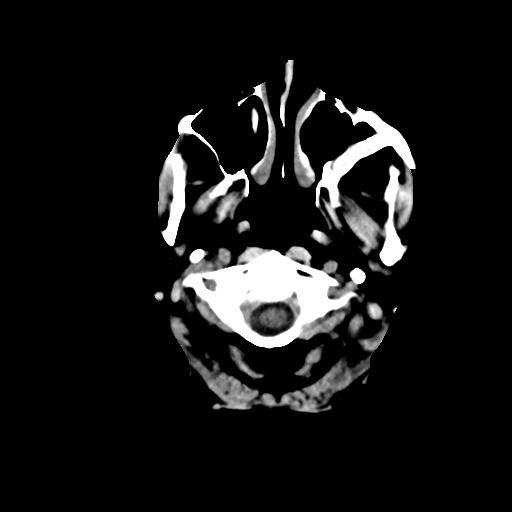
[im 2/30  bone]
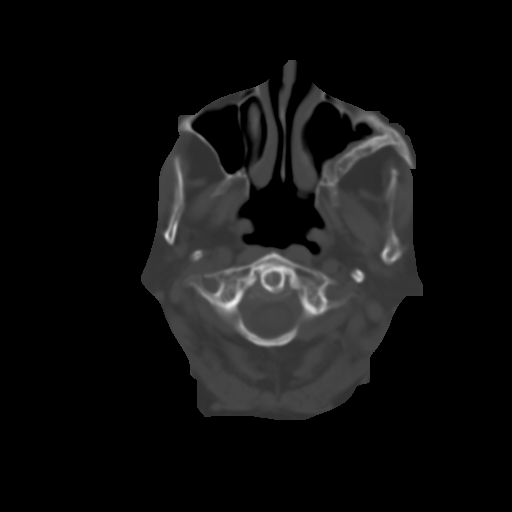
[im 4/30  brain]
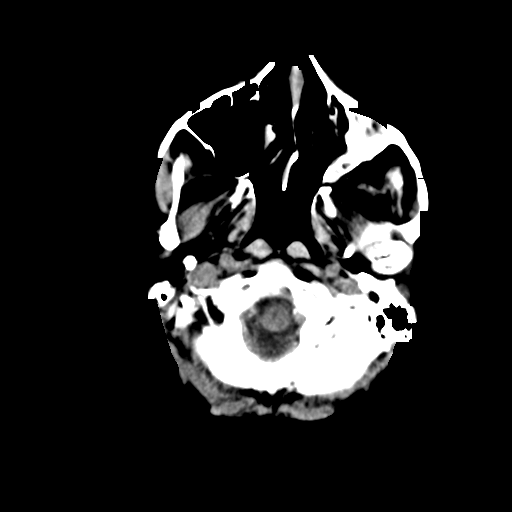
[im 6/30  brain]
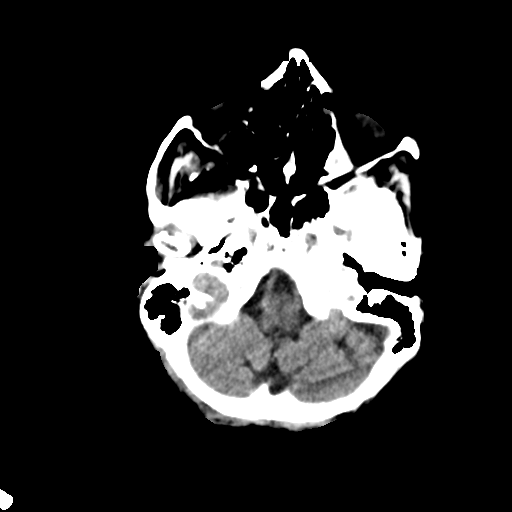
[im 8/30  brain]
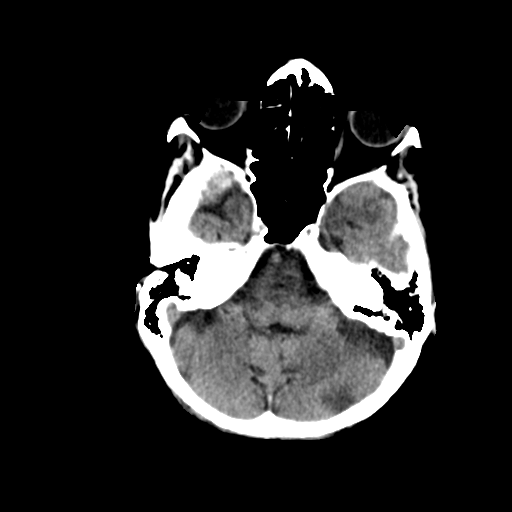
[im 10/30  brain]
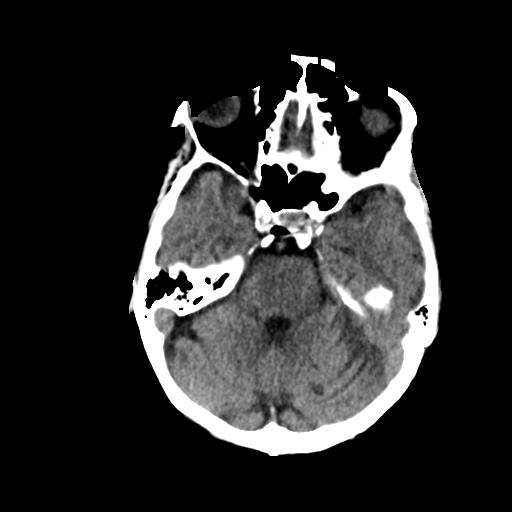
[im 10/30  bone]
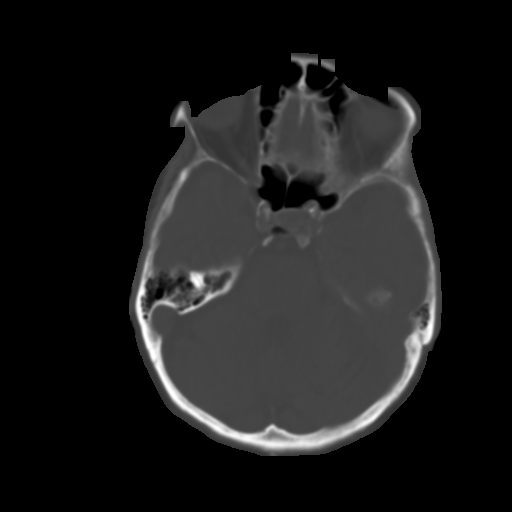
[im 12/30  brain]
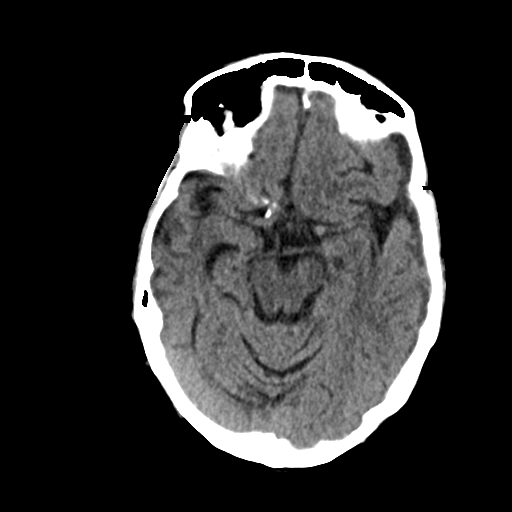
[im 14/30  brain]
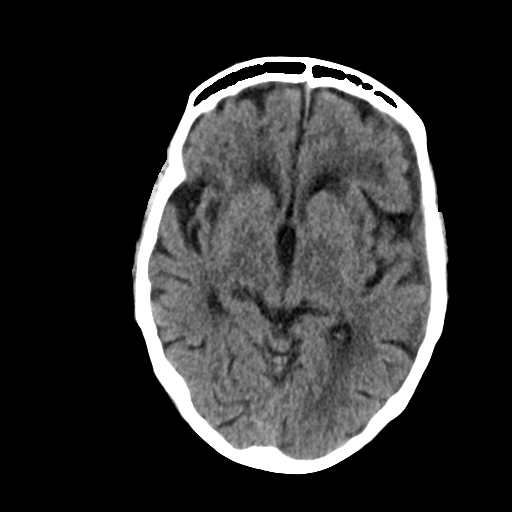
[im 16/30  brain]
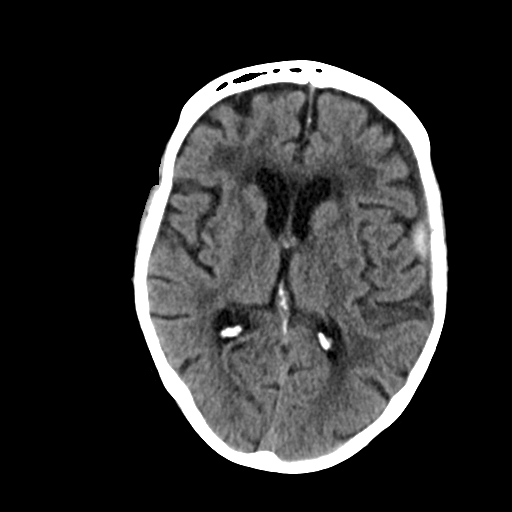
[im 17/30  brain]
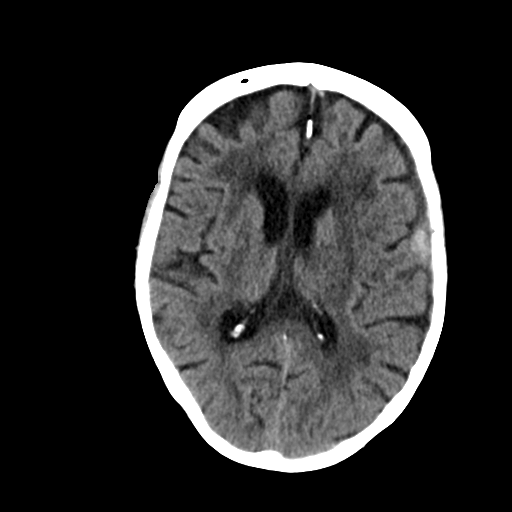
[im 17/30  bone]
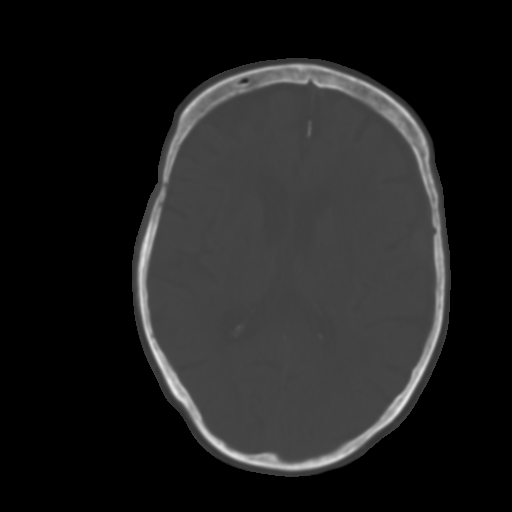
[im 19/30  brain]
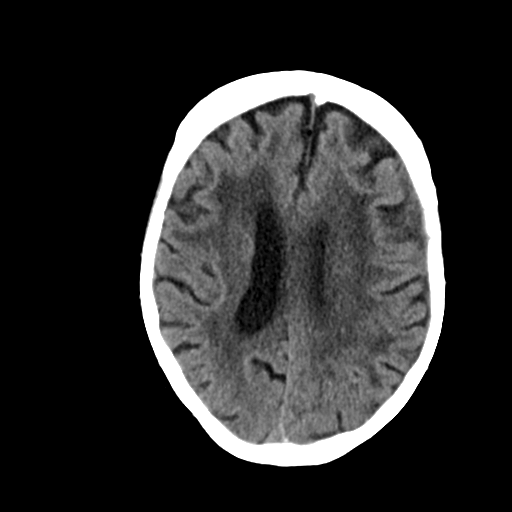
[im 21/30  brain]
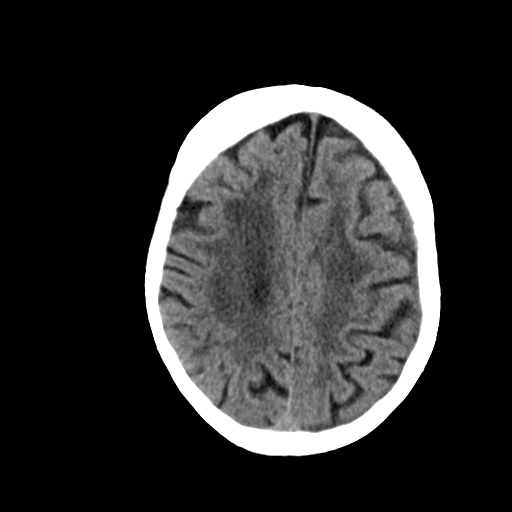
[im 23/30  brain]
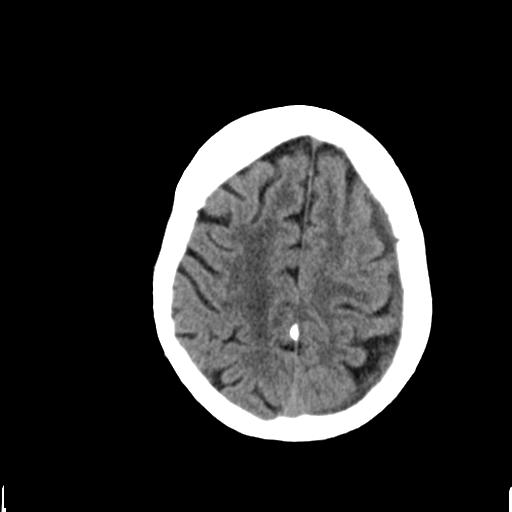
[im 25/30  brain]
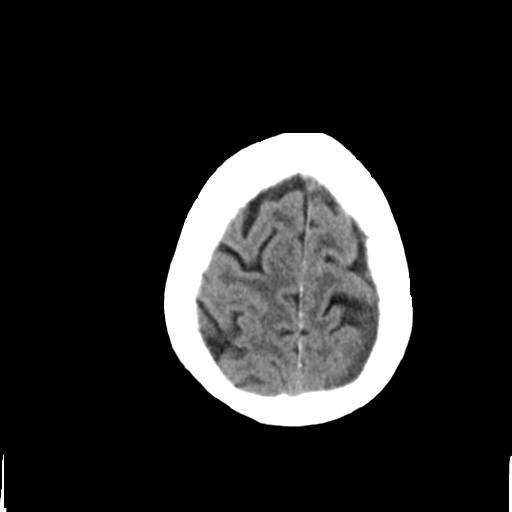
[im 25/30  bone]
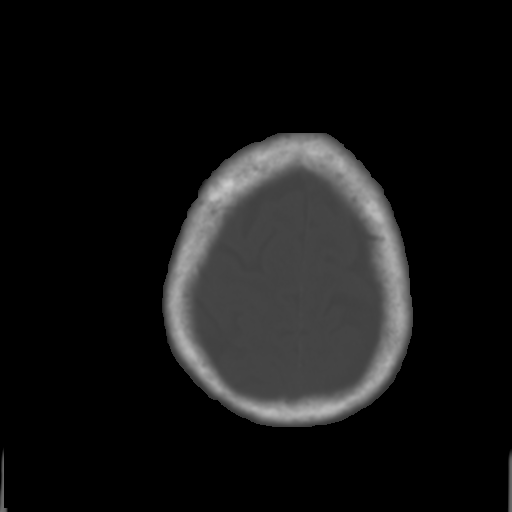
[im 27/30  brain]
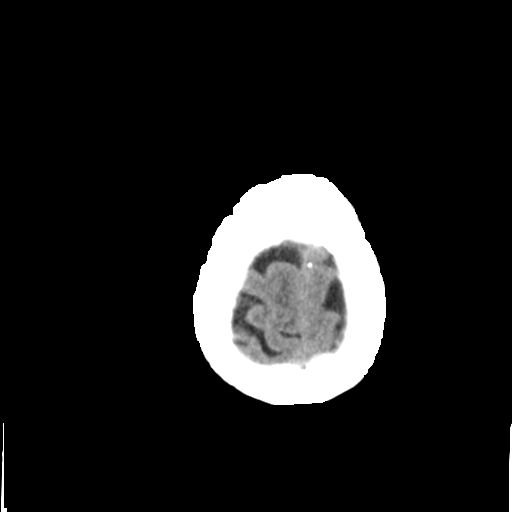
[im 29/30  brain]
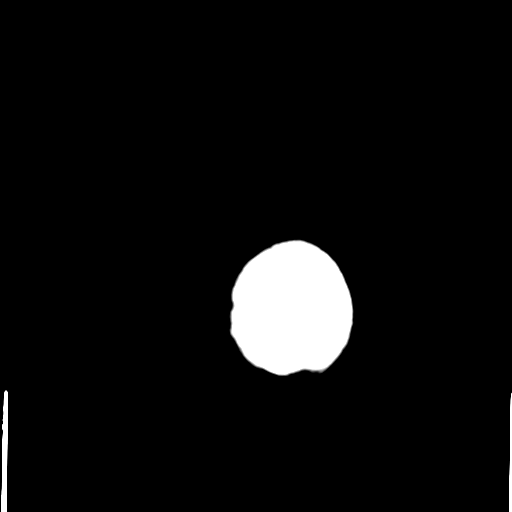

[15 of 30 positions shown; findings below may reference images not displayed]

FINDINGS: Degenerating left holohemispheric subdural hematoma previously up to
8.4 mm, now 8 mm, without Re bleed. 3 mm left-to-right midline
shift, similar.

The ventricles and sulci are overall normal for age. No
intraparenchymal hemorrhage, mass effect nor midline shift.
Confluent supratentorial white matter hypodensities again noted. No
acute large vascular territory infarcts. Remote left cerebellar
infarcts.

Basal cisterns are patent. Moderate calcific atherosclerosis of the
carotid siphons.

No skull fracture. The included ocular globes and orbital contents
are non-suspicious. Status post bilateral ocular lens implants.
Partially imaged left maxillary sinus bony wall thickening mucosal
thickening, consistent with chronic sinusitis. No paranasal sinus
air-fluid levels. Mastoid air cells are well aerated.
IMPRESSION: Degenerating left holohemispheric contracting subdural hematoma, 3
mm residual left-to-right midline shift without rebleed.

Involutional changes. Severe white matter changes suggest chronic
small vessel ischemic disease with remote is left cerebellar
infarct.

Findings discussed with and reconfirmed by [facility] NCE

  By: Nazareth Jumper
